# Patient Record
Sex: Male | Born: 1950 | Race: White | Hispanic: No | Marital: Married | Smoking: Former smoker
Health system: Southern US, Community
[De-identification: ages and names within clinical notes are randomized; demographics above are authoritative.]

## PROBLEM LIST (undated history)

## (undated) DIAGNOSIS — F32A Depression, unspecified: Secondary | ICD-10-CM

## (undated) DIAGNOSIS — E785 Hyperlipidemia, unspecified: Secondary | ICD-10-CM

## (undated) DIAGNOSIS — F419 Anxiety disorder, unspecified: Secondary | ICD-10-CM

## (undated) DIAGNOSIS — E349 Endocrine disorder, unspecified: Secondary | ICD-10-CM

## (undated) DIAGNOSIS — F329 Major depressive disorder, single episode, unspecified: Secondary | ICD-10-CM

## (undated) DIAGNOSIS — I10 Essential (primary) hypertension: Secondary | ICD-10-CM

## (undated) DIAGNOSIS — N400 Enlarged prostate without lower urinary tract symptoms: Secondary | ICD-10-CM

## (undated) HISTORY — DX: Depression, unspecified: F32.A

## (undated) HISTORY — DX: Endocrine disorder, unspecified: E34.9

## (undated) HISTORY — DX: Benign prostatic hyperplasia without lower urinary tract symptoms: N40.0

## (undated) HISTORY — PX: FOOT SURGERY: SHX648

## (undated) HISTORY — PX: HERNIA REPAIR: SHX51

## (undated) HISTORY — DX: Major depressive disorder, single episode, unspecified: F32.9

## (undated) HISTORY — DX: Anxiety disorder, unspecified: F41.9

## (undated) HISTORY — DX: Essential (primary) hypertension: I10

## (undated) HISTORY — DX: Hyperlipidemia, unspecified: E78.5

---

## 1999-02-16 ENCOUNTER — Other Ambulatory Visit: Admission: RE | Admit: 1999-02-16 | Discharge: 1999-02-18 | Payer: Self-pay

## 2009-06-30 ENCOUNTER — Encounter: Admission: RE | Admit: 2009-06-30 | Discharge: 2009-09-28 | Payer: Self-pay | Admitting: Orthopedic Surgery

## 2009-12-05 ENCOUNTER — Emergency Department (HOSPITAL_COMMUNITY): Admission: EM | Admit: 2009-12-05 | Discharge: 2009-12-05 | Payer: Self-pay | Admitting: Emergency Medicine

## 2010-01-14 ENCOUNTER — Ambulatory Visit (HOSPITAL_BASED_OUTPATIENT_CLINIC_OR_DEPARTMENT_OTHER): Admission: RE | Admit: 2010-01-14 | Discharge: 2010-01-14 | Payer: Self-pay | Admitting: Orthopedic Surgery

## 2010-06-16 LAB — URINALYSIS, ROUTINE W REFLEX MICROSCOPIC
Hgb urine dipstick: NEGATIVE
Ketones, ur: NEGATIVE mg/dL
Protein, ur: NEGATIVE mg/dL
Urobilinogen, UA: 0.2 mg/dL (ref 0.0–1.0)

## 2010-06-16 LAB — POCT I-STAT 4, (NA,K, GLUC, HGB,HCT)
Glucose, Bld: 121 mg/dL — ABNORMAL HIGH (ref 70–99)
HCT: 48 % (ref 39.0–52.0)
Potassium: 4.8 mEq/L (ref 3.5–5.1)

## 2012-06-10 ENCOUNTER — Ambulatory Visit
Admission: RE | Admit: 2012-06-10 | Discharge: 2012-06-10 | Disposition: A | Discharge status: Home / Self Care | Payer: BC Managed Care – PPO | Source: Ambulatory Visit | Attending: Family Medicine | Admitting: Family Medicine

## 2012-06-10 ENCOUNTER — Other Ambulatory Visit: Payer: Self-pay | Admitting: Family Medicine

## 2012-06-10 DIAGNOSIS — M545 Low back pain: Secondary | ICD-10-CM

## 2012-06-10 LAB — HEPATIC FUNCTION PANEL
ALT: 30 U/L (ref 10–40)
AST: 26 U/L (ref 14–40)
Alkaline Phosphatase: 55 U/L (ref 25–125)
Bilirubin, Direct: 0.2 mg/dL (ref 0.01–0.4)
Bilirubin, Total: 1.2 mg/dL

## 2012-06-10 LAB — LIPID PANEL
LDL Cholesterol: 123 mg/dL
Triglycerides: 138 mg/dL (ref 40–160)

## 2012-06-10 LAB — BASIC METABOLIC PANEL
BUN: 20 mg/dL (ref 4–21)
Creatinine: 1 mg/dL (ref 0.6–1.3)
Glucose: 113 mg/dL

## 2012-06-10 LAB — CBC AND DIFFERENTIAL
Hemoglobin: 15.8 g/dL (ref 13.5–17.5)
WBC: 8.4 10^3/mL

## 2012-06-10 MED ORDER — IOHEXOL 300 MG/ML  SOLN
125.0000 mL | Freq: Once | INTRAMUSCULAR | Status: AC | PRN
  Administered 2012-06-10: 125 mL via INTRAVENOUS

## 2012-06-11 LAB — FECAL OCCULT BLOOD, GUAIAC: Fecal Occult Blood: POSITIVE

## 2012-07-03 ENCOUNTER — Encounter: Payer: Self-pay | Admitting: Family Medicine

## 2012-07-03 DIAGNOSIS — N400 Enlarged prostate without lower urinary tract symptoms: Secondary | ICD-10-CM | POA: Insufficient documentation

## 2012-07-03 DIAGNOSIS — E349 Endocrine disorder, unspecified: Secondary | ICD-10-CM | POA: Insufficient documentation

## 2012-07-03 DIAGNOSIS — K219 Gastro-esophageal reflux disease without esophagitis: Secondary | ICD-10-CM | POA: Insufficient documentation

## 2012-07-03 DIAGNOSIS — F339 Major depressive disorder, recurrent, unspecified: Secondary | ICD-10-CM | POA: Insufficient documentation

## 2012-07-03 DIAGNOSIS — I10 Essential (primary) hypertension: Secondary | ICD-10-CM | POA: Insufficient documentation

## 2012-07-04 ENCOUNTER — Encounter: Payer: Self-pay | Admitting: Family Medicine

## 2012-07-05 ENCOUNTER — Encounter: Payer: Self-pay | Admitting: Family Medicine

## 2012-07-05 NOTE — Progress Notes (Signed)
Patient ID: Arthur Foster, male   DOB: May 25, 1950, 62 y.o.   MRN: [id number] A user error has taken place: encounter opened in error.

## 2012-07-05 NOTE — Progress Notes (Deleted)
This encounter was created in error - please disregard.

## 2012-07-18 ENCOUNTER — Encounter: Payer: Self-pay | Admitting: Family Medicine

## 2012-11-29 ENCOUNTER — Other Ambulatory Visit: Payer: Self-pay | Admitting: Family Medicine

## 2012-12-03 ENCOUNTER — Other Ambulatory Visit: Payer: Self-pay | Admitting: Family Medicine

## 2012-12-03 NOTE — Telephone Encounter (Signed)
Patient needs to be seen. Has exceeded time since last visit. Needs to bring all medications to next appointment.   

## 2012-12-03 NOTE — Telephone Encounter (Signed)
Last level was 05/09/12 and it was 211. If approved route to pool A  To be called in at [facility]

## 2012-12-19 ENCOUNTER — Ambulatory Visit (INDEPENDENT_AMBULATORY_CARE_PROVIDER_SITE_OTHER): Payer: BC Managed Care – PPO | Admitting: Family Medicine

## 2012-12-19 ENCOUNTER — Encounter: Payer: Self-pay | Admitting: Family Medicine

## 2012-12-19 VITALS — BP 136/78 | HR 71 | Temp 97.4°F | Ht 73.5 in | Wt 312.0 lb

## 2012-12-19 DIAGNOSIS — E785 Hyperlipidemia, unspecified: Secondary | ICD-10-CM

## 2012-12-19 DIAGNOSIS — R413 Other amnesia: Secondary | ICD-10-CM

## 2012-12-19 DIAGNOSIS — N400 Enlarged prostate without lower urinary tract symptoms: Secondary | ICD-10-CM

## 2012-12-19 DIAGNOSIS — I10 Essential (primary) hypertension: Secondary | ICD-10-CM

## 2012-12-19 DIAGNOSIS — F329 Major depressive disorder, single episode, unspecified: Secondary | ICD-10-CM

## 2012-12-19 DIAGNOSIS — R5383 Other fatigue: Secondary | ICD-10-CM

## 2012-12-19 DIAGNOSIS — E349 Endocrine disorder, unspecified: Secondary | ICD-10-CM

## 2012-12-19 DIAGNOSIS — K219 Gastro-esophageal reflux disease without esophagitis: Secondary | ICD-10-CM

## 2012-12-19 DIAGNOSIS — E291 Testicular hypofunction: Secondary | ICD-10-CM

## 2012-12-19 DIAGNOSIS — R5381 Other malaise: Secondary | ICD-10-CM

## 2012-12-19 MED ORDER — TESTOSTERONE 20.25 MG/1.25GM (1.62%) TD GEL: 4.0000 | Freq: Every day | TRANSDERMAL | Status: DC

## 2012-12-19 NOTE — Progress Notes (Signed)
Subjective:    Patient ID: Arthur Foster, male    DOB: 04-21-1950, 62 y.o.   MRN: [id number]  HPI Pt here for follow up of chronic medical problems. Patient comes in today complaining of increased fatigue and weakness. He says his sex drive is diminished. He also complains of short-term memory loss. He has a positive family history for Alzheimer's. He's been off of AndroGel for 3 weeks. He was using 1.62% 4 puffs daily. The reason he ran out of AndroGel was because the prescription expired. Patient Active Problem List   Diagnosis Date Noted  . BPH (benign prostatic hypertrophy) 07/03/2012  . Essential hypertension, benign 07/03/2012  . Depression 07/03/2012  . GERD (gastroesophageal reflux disease) 07/03/2012  . Testosterone deficiency 07/03/2012   Outpatient Encounter Prescriptions as of 12/19/2012  Medication Sig Dispense Refill  . ALPRAZolam (XANAX) 0.25 MG tablet Take 0.25 mg by mouth at bedtime as needed for sleep (1/2 - 1).      . aspirin 81 MG tablet Take 81 mg by mouth daily.      [name] escitalopram (LEXAPRO) 10 MG tablet Take 10 mg by mouth daily.      [name] lisinopril (PRINIVIL,ZESTRIL) 20 MG tablet TAKE 1 TABLET EVERY DAY  90 tablet  0  . Multiple Vitamin (MULTIVITAMIN) tablet Take 1 tablet by mouth daily.      [name] omega-3 acid ethyl esters (LOVAZA) 1 G capsule Take 2 g by mouth daily.       . Testosterone (ANDROGEL) 20.25 MG/1.25GM (1.62%) GEL Place 4 Act onto the skin daily.      . Cholecalciferol (VITAMIN D) 2000 UNITS CAPS Take 1 capsule by mouth daily.       No facility-administered encounter medications on file as of 12/19/2012.       Review of Systems  Constitutional: Positive for fatigue.  HENT: Negative.   Eyes: Negative.   Respiratory: Negative.   Cardiovascular: Negative.   Gastrointestinal: Negative.   Endocrine: Negative.   Genitourinary: Negative.   Musculoskeletal: Negative.   Skin: Negative.   Allergic/Immunologic: Negative.   Neurological: Positive for  weakness.  Hematological: Negative.   Psychiatric/Behavioral: Negative.        Objective:   Physical Exam  Nursing note and vitals reviewed. Constitutional: He is oriented to person, place, and time. He appears well-developed and well-nourished. No distress.  HENT:  Head: Normocephalic and atraumatic.  Right Ear: External ear normal.  Left Ear: External ear normal.  Nose: Nose normal.  Mouth/Throat: Oropharynx is clear and moist. No oropharyngeal exudate.  Eyes: Conjunctivae and EOM are normal. Pupils are equal, round, and reactive to light. Right eye exhibits no discharge. Left eye exhibits no discharge. No scleral icterus.  Neck: Normal range of motion. Neck supple. No thyromegaly present.  Cardiovascular: Normal rate, regular rhythm, normal heart sounds and intact distal pulses.   No murmur heard. At 72 per minute  Pulmonary/Chest: Effort normal and breath sounds normal. No respiratory distress. He has no wheezes. He has no rales.  Abdominal: Soft. Bowel sounds are normal. He exhibits no mass. There is no tenderness. There is no rebound and no guarding.  Mild obesity  Genitourinary: Rectum normal and penis normal. No penile tenderness.  Prostate enlarged bilaterally and smooth without lumps There were no rectal masses There were no inguinal hernias  Musculoskeletal: Normal range of motion. He exhibits no edema and no tenderness.  Lymphadenopathy:    He has no cervical adenopathy.  Neurological: He is alert and oriented to person,  place, and time. He has normal reflexes.  Patient complains of short-term memory loss. Because of this complaint we will do a Mini-Mental Status exam today  Skin: Skin is warm and dry. No rash noted. No erythema. No pallor.  Psychiatric: He has a normal mood and affect. His behavior is normal. Judgment and thought content normal.    BP 136/78  Pulse 71  Temp(Src) 97.4 F (36.3 C) (Oral)  Ht 6' 1.5" (1.867 m)  Wt 312 lb (141.522 kg)  BMI 40.6  kg/m2       Assessment & Plan:   1. BPH (benign prostatic hypertrophy)   2. Essential hypertension, benign   3. Depression   4. GERD (gastroesophageal reflux disease)   5. Testosterone deficiency   6. Fatigue   7. Hyperlipidemia   8. Memory deficits    Orders Placed This Encounter  Procedures  . Hepatic function panel    Standing Status: Future     Number of Occurrences:      Standing Expiration Date: 12/19/2013  . BMP8+EGFR    Standing Status: Future     Number of Occurrences:      Standing Expiration Date: 12/19/2013  . NMR, lipoprofile    Standing Status: Future     Number of Occurrences:      Standing Expiration Date: 12/19/2013  . Vitamin B12    Standing Status: Future     Number of Occurrences:      Standing Expiration Date: 12/19/2013  . Vit D  25 hydroxy (rtn osteoporosis monitoring)    Standing Status: Future     Number of Occurrences:      Standing Expiration Date: 12/19/2013  . Thyroid Panel With TSH    Standing Status: Future     Number of Occurrences:      Standing Expiration Date: 12/19/2013  . POCT CBC    Standing Status: Future     Number of Occurrences:      Standing Expiration Date: 12/19/2013   Patient Instructions  Continue current medications. Continue good therapeutic lifestyle changes.  Fall precautions discussed with patient.  Schedule your flu vaccine the first of October.  Follow up as planned and earlier as needed.  Be sure and call for AndroGel refill in February 2015    Nyra Capes MD

## 2012-12-19 NOTE — Patient Instructions (Addendum)
Continue current medications. Continue good therapeutic lifestyle changes.  Fall precautions discussed with patient.  Schedule your flu vaccine the first of October.  Follow up as planned and earlier as needed.  Be sure and call for AndroGel refill in February 2015

## 2012-12-23 ENCOUNTER — Other Ambulatory Visit (INDEPENDENT_AMBULATORY_CARE_PROVIDER_SITE_OTHER): Payer: BC Managed Care – PPO

## 2012-12-23 DIAGNOSIS — E349 Endocrine disorder, unspecified: Secondary | ICD-10-CM

## 2012-12-23 DIAGNOSIS — E785 Hyperlipidemia, unspecified: Secondary | ICD-10-CM

## 2012-12-23 DIAGNOSIS — R5381 Other malaise: Secondary | ICD-10-CM

## 2012-12-23 DIAGNOSIS — E291 Testicular hypofunction: Secondary | ICD-10-CM

## 2012-12-23 DIAGNOSIS — I10 Essential (primary) hypertension: Secondary | ICD-10-CM

## 2012-12-23 DIAGNOSIS — K219 Gastro-esophageal reflux disease without esophagitis: Secondary | ICD-10-CM

## 2012-12-23 DIAGNOSIS — N400 Enlarged prostate without lower urinary tract symptoms: Secondary | ICD-10-CM

## 2012-12-23 DIAGNOSIS — R5383 Other fatigue: Secondary | ICD-10-CM

## 2012-12-23 LAB — POCT CBC
Granulocyte percent: 69.2 %G (ref 37–80)
HCT, POC: 45.4 % (ref 43.5–53.7)
Hemoglobin: 15.3 g/dL (ref 14.1–18.1)
MPV: 7.8 fL (ref 0–99.8)
POC Granulocyte: 4.3 (ref 2–6.9)

## 2012-12-23 NOTE — Progress Notes (Signed)
Pt came in for labs only 

## 2012-12-25 LAB — BMP8+EGFR
BUN/Creatinine Ratio: 20 (ref 9–20)
BUN: 22 mg/dL (ref 6–24)
Chloride: 102 mmol/L (ref 97–108)
GFR calc Af Amer: 83 mL/min/{1.73_m2} (ref >59)
GFR calc non Af Amer: 72 mL/min/{1.73_m2} (ref >59)
Glucose: 118 mg/dL — ABNORMAL HIGH (ref 65–99)
Potassium: 5.3 mmol/L — ABNORMAL HIGH (ref 3.5–5.2)

## 2012-12-25 LAB — NMR, LIPOPROFILE
Cholesterol: 217 mg/dL — ABNORMAL HIGH (ref <200)
HDL Particle Number: 35.2 umol/L (ref >=30.5)
LDL Particle Number: 2146 nmol/L — ABNORMAL HIGH (ref <1000)
LDLC SERPL CALC-MCNC: 141 mg/dL — ABNORMAL HIGH (ref <100)
LP-IR Score: 78 — ABNORMAL HIGH (ref <=45)
Triglycerides by NMR: 150 mg/dL — ABNORMAL HIGH (ref <150)

## 2012-12-25 LAB — THYROID PANEL WITH TSH
Free Thyroxine Index: 2.1 (ref 1.2–4.9)
T4, Total: 6.7 ug/dL (ref 4.5–12.0)
TSH: 3.23 u[IU]/mL (ref 0.450–4.500)

## 2012-12-25 LAB — HEPATIC FUNCTION PANEL
Albumin: 4.6 g/dL (ref 3.5–5.5)
Alkaline Phosphatase: 61 IU/L (ref 39–117)
Bilirubin, Direct: 0.28 mg/dL (ref 0.00–0.40)
Total Protein: 6.7 g/dL (ref 6.0–8.5)

## 2012-12-25 LAB — PSA, TOTAL AND FREE: PSA, Free: 0.11 ng/mL

## 2012-12-25 LAB — TESTOSTERONE,FREE AND TOTAL: Testosterone: 70 ng/dL — ABNORMAL LOW (ref 348–1197)

## 2013-01-03 ENCOUNTER — Telehealth: Payer: Self-pay | Admitting: Family Medicine

## 2013-01-11 ENCOUNTER — Other Ambulatory Visit: Payer: Self-pay | Admitting: Family Medicine

## 2013-01-13 NOTE — Telephone Encounter (Signed)
Last seen 06/10/12 DWM  If approved route to nurse to call into [facility]

## 2013-01-13 NOTE — Telephone Encounter (Signed)
This is okay with one refill

## 2013-01-14 ENCOUNTER — Telehealth: Payer: Self-pay | Admitting: Family Medicine

## 2013-01-14 NOTE — Telephone Encounter (Signed)
Pt med called to pharm

## 2013-01-17 NOTE — Telephone Encounter (Signed)
Already called

## 2013-01-22 NOTE — Telephone Encounter (Signed)
Patient called and discussed laboratory results.  I discussed starting medication for cholesterol and he refuses to start statin currently.   Left information about Mediterranean Diet at front desk for patient.

## 2013-03-02 ENCOUNTER — Other Ambulatory Visit: Payer: Self-pay | Admitting: Family Medicine

## 2013-03-03 ENCOUNTER — Telehealth: Payer: Self-pay | Admitting: Family Medicine

## 2013-03-20 ENCOUNTER — Other Ambulatory Visit: Payer: Self-pay | Admitting: Family Medicine

## 2013-03-24 NOTE — Telephone Encounter (Signed)
Last seen 12/19/12  DWM   If approved route to nurse to call into [facility]

## 2013-03-24 NOTE — Telephone Encounter (Signed)
This is okay for 6 months 

## 2013-03-25 NOTE — Telephone Encounter (Signed)
[facility] 

## 2013-05-21 ENCOUNTER — Ambulatory Visit (INDEPENDENT_AMBULATORY_CARE_PROVIDER_SITE_OTHER): Payer: BC Managed Care – PPO | Admitting: Nurse Practitioner

## 2013-05-21 VITALS — BP 153/87 | HR 77 | Temp 99.1°F

## 2013-05-21 DIAGNOSIS — R059 Cough, unspecified: Secondary | ICD-10-CM

## 2013-05-21 DIAGNOSIS — R05 Cough: Secondary | ICD-10-CM

## 2013-05-21 DIAGNOSIS — J069 Acute upper respiratory infection, unspecified: Secondary | ICD-10-CM

## 2013-05-21 DIAGNOSIS — J029 Acute pharyngitis, unspecified: Secondary | ICD-10-CM

## 2013-05-21 DIAGNOSIS — B9789 Other viral agents as the cause of diseases classified elsewhere: Secondary | ICD-10-CM

## 2013-05-21 LAB — POCT RAPID STREP A (OFFICE): RAPID STREP A SCREEN: NEGATIVE

## 2013-05-21 LAB — POCT INFLUENZA A/B
Influenza A, POC: NEGATIVE
Influenza B, POC: NEGATIVE

## 2013-05-21 NOTE — Patient Instructions (Addendum)

## 2013-05-21 NOTE — Progress Notes (Signed)
   Subjective:    Patient ID: Arthur Foster, male    DOB: 26-Apr-1950, 63 y.o.   MRN: [id number]  HPI Patient presents today with complaints of a 'Cold' that began last Friday. Symptoms include runny nose, productive cough, sore throat, post nasal drip, and fatigue. Denies fever, sinus tenderness. Does have a history of allergies and takes generic Claritin which does help. Has not taken any over the counter medications.    Review of Systems  HENT: Positive for congestion, postnasal drip, rhinorrhea and sore throat. Negative for ear pain and sinus pressure.   Respiratory: Positive for cough. Negative for shortness of breath.   Cardiovascular: Negative for chest pain.  Genitourinary: Negative.   Neurological: Positive for headaches.       Objective:   Physical Exam  HENT:  Right Ear: External ear normal.  Left Ear: External ear normal.  Nose: Mucosal edema and rhinorrhea present. Right sinus exhibits no maxillary sinus tenderness and no frontal sinus tenderness. Left sinus exhibits no maxillary sinus tenderness and no frontal sinus tenderness.  Eyes: Pupils are equal, round, and reactive to light.  Neck: Normal range of motion. Neck supple.   BP 153/87  Pulse 77  Temp(Src) 99.1 F (37.3 C) (Oral) Results for orders placed in visit on 05/21/13  POCT INFLUENZA A/B      Result Value Ref Range   Influenza A, POC Negative     Influenza B, POC Negative    POCT RAPID STREP A (OFFICE)      Result Value Ref Range   Rapid Strep A Screen Negative  Negative          Assessment & Plan:   1. Sore throat   2. Cough   3. Viral URI with cough    1. Take meds as prescribed 2. Use a cool mist humidifier especially during the winter months and when heat has been humid. 3. Use saline nose sprays frequently 4. Saline irrigations of the nose can be very helpful if done frequently.  * 4X daily for 1 week*  * Use of a nettie pot can be helpful with this. Follow directions with this* 5. Drink  plenty of fluids 6. Keep thermostat turn down low 7.For any cough or congestion  Use plain Mucinex- regular strength or max strength is fine- NO DECONGESTANTS!   * Children- consult with Pharmacist for dosing 8. For fever or aces or pains- take tylenol or ibuprofen appropriate for age and weight.  * for fevers greater than 101 orally you may alternate ibuprofen and tylenol every  3 hours.   Mary-Margaret Hassell Done, FNP

## 2013-05-28 ENCOUNTER — Telehealth: Payer: Self-pay | Admitting: Nurse Practitioner

## 2013-05-30 ENCOUNTER — Telehealth: Payer: Self-pay | Admitting: Nurse Practitioner

## 2013-05-30 MED ORDER — AZITHROMYCIN 250 MG PO TABS: ORAL_TABLET | ORAL | Status: DC

## 2013-05-30 NOTE — Telephone Encounter (Signed)
Have already called in antibiotic earlier today

## 2013-05-30 NOTE — Telephone Encounter (Signed)
Patient aware.

## 2013-05-30 NOTE — Telephone Encounter (Signed)
rx sent to pharmacy

## 2013-06-01 ENCOUNTER — Other Ambulatory Visit: Payer: Self-pay | Admitting: Family Medicine

## 2013-06-03 NOTE — Telephone Encounter (Signed)
Please let patient know this has been approved.

## 2013-06-18 ENCOUNTER — Encounter: Payer: Self-pay | Admitting: Family Medicine

## 2013-06-18 ENCOUNTER — Ambulatory Visit (INDEPENDENT_AMBULATORY_CARE_PROVIDER_SITE_OTHER): Payer: BC Managed Care – PPO | Admitting: Family Medicine

## 2013-06-18 VITALS — BP 153/91 | HR 70 | Temp 97.1°F | Ht 73.5 in | Wt 313.0 lb

## 2013-06-18 DIAGNOSIS — N400 Enlarged prostate without lower urinary tract symptoms: Secondary | ICD-10-CM

## 2013-06-18 DIAGNOSIS — F329 Major depressive disorder, single episode, unspecified: Secondary | ICD-10-CM

## 2013-06-18 DIAGNOSIS — E349 Endocrine disorder, unspecified: Secondary | ICD-10-CM

## 2013-06-18 DIAGNOSIS — E559 Vitamin D deficiency, unspecified: Secondary | ICD-10-CM

## 2013-06-18 DIAGNOSIS — S60222A Contusion of left hand, initial encounter: Secondary | ICD-10-CM

## 2013-06-18 DIAGNOSIS — R5383 Other fatigue: Secondary | ICD-10-CM

## 2013-06-18 DIAGNOSIS — F32A Depression, unspecified: Secondary | ICD-10-CM

## 2013-06-18 DIAGNOSIS — J309 Allergic rhinitis, unspecified: Secondary | ICD-10-CM

## 2013-06-18 DIAGNOSIS — L03114 Cellulitis of left upper limb: Secondary | ICD-10-CM

## 2013-06-18 DIAGNOSIS — Z0000 Encounter for general adult medical examination without abnormal findings: Secondary | ICD-10-CM

## 2013-06-18 DIAGNOSIS — K219 Gastro-esophageal reflux disease without esophagitis: Secondary | ICD-10-CM

## 2013-06-18 DIAGNOSIS — I10 Essential (primary) hypertension: Secondary | ICD-10-CM

## 2013-06-18 LAB — POCT CBC
Granulocyte percent: 68.2 %G (ref 37–80)
HCT, POC: 47.5 % (ref 43.5–53.7)
HEMOGLOBIN: 15.1 g/dL (ref 14.1–18.1)
Lymph, poc: 2.2 (ref 0.6–3.4)
MCH: 28.5 pg (ref 27–31.2)
MCHC: 31.8 g/dL (ref 31.8–35.4)
MCV: 89.4 fL (ref 80–97)
MPV: 8.1 fL (ref 0–99.8)
POC Granulocyte: 5.3 (ref 2–6.9)
POC LYMPH PERCENT: 28.7 %L (ref 10–50)
Platelet Count, POC: 315 10*3/uL (ref 142–424)
RBC: 5.3 M/uL (ref 4.69–6.13)
RDW, POC: 13.1 %
WBC: 7.7 10*3/uL (ref 4.6–10.2)

## 2013-06-18 LAB — POCT URINALYSIS DIPSTICK
Bilirubin, UA: NEGATIVE
Glucose, UA: NEGATIVE
Ketones, UA: NEGATIVE
Leukocytes, UA: NEGATIVE
Nitrite, UA: NEGATIVE
PROTEIN UA: NEGATIVE
RBC UA: NEGATIVE
SPEC GRAV UA: 1.015
UROBILINOGEN UA: NEGATIVE
pH, UA: 6.5

## 2013-06-18 LAB — POCT UA - MICROSCOPIC ONLY
BACTERIA, U MICROSCOPIC: NEGATIVE
CASTS, UR, LPF, POC: NEGATIVE
CRYSTALS, UR, HPF, POC: NEGATIVE
MUCUS UA: NEGATIVE
RBC, urine, microscopic: NEGATIVE
WBC, Ur, HPF, POC: NEGATIVE
Yeast, UA: NEGATIVE

## 2013-06-18 MED ORDER — ESCITALOPRAM OXALATE 10 MG PO TABS: 10.0000 mg | ORAL_TABLET | Freq: Every day | ORAL | Status: DC

## 2013-06-18 MED ORDER — OMEGA-3-ACID ETHYL ESTERS 1 G PO CAPS: ORAL_CAPSULE | ORAL | Status: DC

## 2013-06-18 MED ORDER — TESTOSTERONE 20.25 MG/1.25GM (1.62%) TD GEL: 4.0000 | Freq: Every day | TRANSDERMAL | Status: DC

## 2013-06-18 MED ORDER — LISINOPRIL 20 MG PO TABS: ORAL_TABLET | ORAL | Status: DC

## 2013-06-18 MED ORDER — ALPRAZOLAM 0.25 MG PO TABS: ORAL_TABLET | ORAL | Status: DC

## 2013-06-18 MED ORDER — SULFAMETHOXAZOLE-TMP DS 800-160 MG PO TABS: 1.0000 | ORAL_TABLET | Freq: Two times a day (BID) | ORAL | Status: DC

## 2013-06-18 NOTE — Progress Notes (Signed)
Subjective:    Patient ID: Arthur Foster, male    DOB: 1950-11-02, 62 y.o.   MRN: [id number]  HPI Patient is here today for annual wellness exam and follow up of chronic medical problems. Patient is on chronic testosterone replacement. He does complain of some swelling in his left hand. His chest x-ray and cardiogram was or less than 2 years. Because of testosterone replacement he will get a PSA and prostate exam today. He will also get the remainder of his labs done today. She will be given an FOBT to return. He will check with his insurance regarding the Prevnar vaccine.        Patient Active Problem List   Diagnosis Date Noted  . BPH (benign prostatic hypertrophy) 07/03/2012  . Essential hypertension, benign 07/03/2012  . Depression 07/03/2012  . GERD (gastroesophageal reflux disease) 07/03/2012  . Testosterone deficiency 07/03/2012   Outpatient Encounter Prescriptions as of 06/18/2013  Medication Sig  . ALPRAZolam (XANAX) 0.25 MG tablet TAKE 1/2 TO 1 TABLET AT BEDTIME AS NEEDED  . aspirin 81 MG tablet Take 81 mg by mouth daily.  [name] escitalopram (LEXAPRO) 10 MG tablet Take 10 mg by mouth daily.  [name] lisinopril (PRINIVIL,ZESTRIL) 20 MG tablet TAKE 1 TABLET EVERY DAY  . Multiple Vitamin (MULTIVITAMIN) tablet Take 1 tablet by mouth daily.  [name] omega-3 acid ethyl esters (LOVAZA) 1 G capsule TAKE 2 CAPSULES TWICE A DAY  . Testosterone (ANDROGEL) 20.25 MG/1.25GM (1.62%) GEL Place 4 Act onto the skin daily.  . [DISCONTINUED] azithromycin (ZITHROMAX Z-PAK) 250 MG tablet As direceted    Review of Systems  Constitutional: Negative.   HENT: Negative.   Eyes: Negative.   Respiratory: Negative.   Cardiovascular: Negative.   Gastrointestinal: Negative.   Endocrine: Negative.   Genitourinary: Negative.   Musculoskeletal: Negative.   Skin: Negative.        Left hand swollen   Allergic/Immunologic: Negative.   Neurological: Negative.   Hematological: Negative.   Psychiatric/Behavioral:  Negative.        Objective:   Physical Exam  Nursing note and vitals reviewed. Constitutional: He is oriented to person, place, and time. He appears well-developed and well-nourished. No distress.  HENT:  Head: Normocephalic and atraumatic.  Right Ear: External ear normal.  Left Ear: External ear normal.  Mouth/Throat: Oropharynx is clear and moist. No oropharyngeal exudate.  Nasal congestion and turbinate swelling bilaterally. Slightly red throat posteriorly  Eyes: Conjunctivae and EOM are normal. Pupils are equal, round, and reactive to light. Right eye exhibits no discharge. Left eye exhibits no discharge. No scleral icterus.  Neck: Normal range of motion. Neck supple. No thyromegaly present.  No carotid bruits  Cardiovascular: Normal rate, regular rhythm, normal heart sounds and intact distal pulses.  Exam reveals no gallop and no friction rub.   No murmur heard. 72 per minute  Pulmonary/Chest: Effort normal and breath sounds normal. No respiratory distress. He has no wheezes. He has no rales. He exhibits no tenderness.  No axillary adenopathy, no chest wall mass   Abdominal: Soft. Bowel sounds are normal. He exhibits no mass. There is no tenderness. There is no rebound and no guarding.  Obesity  Genitourinary: Rectum normal and penis normal.  The prostate was enlarged but soft and smooth. There were no nodules or masses. There are no rectal masses. There is no inguinal hernia palpated. There were no inguinal nodes. The external genitalia were normal there  Musculoskeletal: Normal range of motion. He exhibits edema. He exhibits  no tenderness.  There was slight edema and discoloration of the dorsal aspect of the left hand. There were 2 small puncture wounds. There was some rubor but more from what appeared to be a resolving hematoma.  Lymphadenopathy:    He has no cervical adenopathy.  Neurological: He is alert and oriented to person, place, and time. He has normal reflexes. No  cranial nerve deficit.  Skin: Skin is warm and dry. No rash noted. There is erythema. No pallor.  Left dorsal hand  Psychiatric: He has a normal mood and affect. His behavior is normal. Judgment and thought content normal.   BP 153/91  Pulse 70  Temp(Src) 97.1 F (36.2 C) (Oral)  Ht 6' 1.5" (1.867 m)  Wt 313 lb (141.976 kg)  BMI 40.73 kg/m2 Blood pressure 136/95 seen right arm       Assessment & Plan:  1. BPH (benign prostatic hypertrophy) - POCT CBC - POCT UA - Microscopic Only - POCT urinalysis dipstick - PSA, total and free - Testosterone,Free and Total  2. Essential hypertension, benign - POCT CBC - BMP8+EGFR - Hepatic function panel  3. GERD (gastroesophageal reflux disease) - POCT CBC - Hepatic function panel - NMR, lipoprofile  4. Testosterone deficiency - POCT CBC - PSA, total and free - Testosterone,Free and Total - Testosterone (ANDROGEL) 20.25 MG/1.25GM (1.62%) GEL; Place 4 Act onto the skin daily.  Dispense: 2 g; Refill: 5  5. Annual physical exam - POCT CBC - POCT UA - Microscopic Only - POCT urinalysis dipstick - BMP8+EGFR - Hepatic function panel - NMR, lipoprofile - PSA, total and free - Testosterone,Free and Total - Vit D  25 hydroxy (rtn osteoporosis monitoring)  6. Vitamin D deficiency - Vit D  25 hydroxy (rtn osteoporosis monitoring)  7. Depression - Testosterone (ANDROGEL) 20.25 MG/1.25GM (1.62%) GEL; Place 4 Act onto the skin daily.  Dispense: 2 g; Refill: 5  8. Fatigue - Testosterone (ANDROGEL) 20.25 MG/1.25GM (1.62%) GEL; Place 4 Act onto the skin daily.  Dispense: 2 g; Refill: 5  9. Paroxysmal hematoma of left hand  10. Cellulitis of left hand -Septra DS one twice daily for a 14 day  11. Allergic rhinitis -Flonase or Nasacort as directed  Patient Instructions  Continue current medications. Continue good therapeutic lifestyle changes which include good diet and exercise. Fall precautions discussed with patient. If an  FOBT was given today- please return it to our front desk. If you are over 60 years old - you may need Prevnar 78 or the adult Pneumonia vaccine.  Please do not forget to get your eye exam every 1-2 years Check with your insurance regarding the Prevnar and shingles vaccine Can purchase an [employer] blood pressure monitor to monitor your blood pressures at home Check pressures regularly and bring readings by 2-3 weeks and let the nurse checked her blood pressure here Watch your sodium intake Do not forget to return here FOBT  For allergic rhinitis you can purchase Nasacort over-the-counter or Flonase nasal spray over-the-counter. 1-2 sprays each nostril at bedtime Try to do your best with losing weight   Arrie Senate MD

## 2013-06-18 NOTE — Patient Instructions (Addendum)
Continue current medications. Continue good therapeutic lifestyle changes which include good diet and exercise. Fall precautions discussed with patient. If an FOBT was given today- please return it to our front desk. If you are over 61 years old - you may need Prevnar 18 or the adult Pneumonia vaccine.  Please do not forget to get your eye exam every 1-2 years Check with your insurance regarding the Prevnar and shingles vaccine Can purchase an [employer] blood pressure monitor to monitor your blood pressures at home Check pressures regularly and bring readings by 2-3 weeks and let the nurse checked her blood pressure here Watch your sodium intake Do not forget to return here FOBT  For allergic rhinitis you can purchase Nasacort over-the-counter or Flonase nasal spray over-the-counter. 1-2 sprays each nostril at bedtime Try to do your best with losing weight

## 2013-06-19 ENCOUNTER — Telehealth: Payer: Self-pay | Admitting: Family Medicine

## 2013-06-20 LAB — HEPATIC FUNCTION PANEL
ALBUMIN: 4.6 g/dL (ref 3.6–4.8)
ALK PHOS: 57 IU/L (ref 39–117)
ALT: 34 IU/L (ref 0–44)
AST: 24 IU/L (ref 0–40)
Bilirubin, Direct: 0.22 mg/dL (ref 0.00–0.40)
Total Bilirubin: 0.9 mg/dL (ref 0.0–1.2)
Total Protein: 7.1 g/dL (ref 6.0–8.5)

## 2013-06-20 LAB — BMP8+EGFR
BUN/Creatinine Ratio: 15 (ref 10–22)
BUN: 18 mg/dL (ref 8–27)
CHLORIDE: 100 mmol/L (ref 97–108)
CO2: 22 mmol/L (ref 18–29)
Calcium: 9.6 mg/dL (ref 8.6–10.2)
Creatinine, Ser: 1.17 mg/dL (ref 0.76–1.27)
GFR calc non Af Amer: 67 mL/min/{1.73_m2} (ref >59)
GFR, EST AFRICAN AMERICAN: 78 mL/min/{1.73_m2} (ref >59)
Glucose: 107 mg/dL — ABNORMAL HIGH (ref 65–99)
POTASSIUM: 5 mmol/L (ref 3.5–5.2)
Sodium: 139 mmol/L (ref 134–144)

## 2013-06-20 LAB — NMR, LIPOPROFILE
Cholesterol: 225 mg/dL — ABNORMAL HIGH (ref <200)
HDL Cholesterol by NMR: 51 mg/dL (ref >=40)
HDL PARTICLE NUMBER: 35.3 umol/L (ref >=30.5)
LDL Particle Number: 2065 nmol/L — ABNORMAL HIGH (ref <1000)
LDL Size: 20.9 nm (ref >20.5)
LDLC SERPL CALC-MCNC: 147 mg/dL — AB (ref <100)
LP-IR Score: 83 — ABNORMAL HIGH (ref <=45)
Small LDL Particle Number: 843 nmol/L — ABNORMAL HIGH (ref <=527)
TRIGLYCERIDES BY NMR: 134 mg/dL (ref <150)

## 2013-06-20 LAB — PSA, TOTAL AND FREE
PSA, Free Pct: 21.4 %
PSA, Free: 0.15 ng/mL
PSA: 0.7 ng/mL (ref 0.0–4.0)

## 2013-06-20 LAB — TESTOSTERONE,FREE AND TOTAL
TESTOSTERONE: 153 ng/dL — AB (ref 348–1197)
Testosterone, Free: 8.6 pg/mL (ref 6.6–18.1)

## 2013-06-20 LAB — VITAMIN D 25 HYDROXY (VIT D DEFICIENCY, FRACTURES): VIT D 25 HYDROXY: 27.6 ng/mL — AB (ref 30.0–100.0)

## 2013-06-23 MED ORDER — PHENTERMINE HCL 37.5 MG PO CAPS: 37.5000 mg | ORAL_CAPSULE | ORAL | Status: DC

## 2013-06-23 NOTE — Telephone Encounter (Signed)
Use of Belviq and SSRI could lead to increase in serotonin.  Studies for Belviq excluded patient's on SSRI.  Although serotonin syndrome is rare I recommend hold escitalopram while taking Belviq. Patient is hesitant to stop escitalopram because when he stopped before his anxiety got worse.  Recommend try phentermine 37.5mg  1/2 to 1 tablet each morning.

## 2013-06-23 NOTE — Telephone Encounter (Signed)
Please see note by clinical pharmacist. We will prevent a prescription for phentermine 37.5 one half to one tablet each morning #30 He will come by and get his weight take the by mouth regularly for 30 days and come by and get another weight at that time

## 2013-06-23 NOTE — Telephone Encounter (Signed)
Tammy, this is okay,if you will double check with his medication list to make sure there is no contraindication.

## 2013-07-01 ENCOUNTER — Other Ambulatory Visit: Payer: Self-pay | Admitting: Family Medicine

## 2013-07-03 ENCOUNTER — Other Ambulatory Visit: Payer: Self-pay | Admitting: Family Medicine

## 2013-08-03 ENCOUNTER — Other Ambulatory Visit: Payer: Self-pay | Admitting: Family Medicine

## 2013-10-07 ENCOUNTER — Other Ambulatory Visit: Payer: Self-pay | Admitting: Family Medicine

## 2013-10-09 NOTE — Telephone Encounter (Signed)
Last seen 06/18/13, last filled 08/27/13. Route to pool A, nurse call in at [facility]

## 2013-10-09 NOTE — Telephone Encounter (Signed)
This is okay to refill 

## 2013-10-10 NOTE — Telephone Encounter (Signed)
Called in.

## 2013-10-20 ENCOUNTER — Other Ambulatory Visit: Payer: Self-pay | Admitting: Family Medicine

## 2013-10-21 ENCOUNTER — Other Ambulatory Visit: Payer: Self-pay | Admitting: Family Medicine

## 2013-10-21 NOTE — Telephone Encounter (Signed)
This is okay to refill. Patient needs to have an appointment to be seen in September for continued refills

## 2013-10-21 NOTE — Telephone Encounter (Signed)
Patient aware to be picked up

## 2013-10-21 NOTE — Telephone Encounter (Signed)
This was already taken care of.

## 2013-10-21 NOTE — Telephone Encounter (Signed)
Patient last seen in office on 06-18-13. Rx last filled on 09-08-13 for 150g. Please advise. If approved please print and route to Pool A so nurse can call patient to pick up

## 2013-12-24 ENCOUNTER — Encounter: Payer: Self-pay | Admitting: Family Medicine

## 2013-12-24 ENCOUNTER — Ambulatory Visit (INDEPENDENT_AMBULATORY_CARE_PROVIDER_SITE_OTHER): Payer: BC Managed Care – PPO | Admitting: Family Medicine

## 2013-12-24 VITALS — BP 150/94 | HR 70 | Temp 97.9°F | Ht 73.5 in | Wt 318.0 lb

## 2013-12-24 DIAGNOSIS — K219 Gastro-esophageal reflux disease without esophagitis: Secondary | ICD-10-CM

## 2013-12-24 DIAGNOSIS — G4700 Insomnia, unspecified: Secondary | ICD-10-CM

## 2013-12-24 DIAGNOSIS — E8881 Metabolic syndrome: Secondary | ICD-10-CM

## 2013-12-24 DIAGNOSIS — E291 Testicular hypofunction: Secondary | ICD-10-CM

## 2013-12-24 DIAGNOSIS — I10 Essential (primary) hypertension: Secondary | ICD-10-CM

## 2013-12-24 DIAGNOSIS — E349 Endocrine disorder, unspecified: Secondary | ICD-10-CM

## 2013-12-24 DIAGNOSIS — N400 Enlarged prostate without lower urinary tract symptoms: Secondary | ICD-10-CM

## 2013-12-24 DIAGNOSIS — E559 Vitamin D deficiency, unspecified: Secondary | ICD-10-CM

## 2013-12-24 DIAGNOSIS — J301 Allergic rhinitis due to pollen: Secondary | ICD-10-CM

## 2013-12-24 LAB — POCT CBC
GRANULOCYTE PERCENT: 71.8 % (ref 37–80)
HEMATOCRIT: 48.7 % (ref 43.5–53.7)
Hemoglobin: 16.2 g/dL (ref 14.1–18.1)
LYMPH, POC: 1.6 (ref 0.6–3.4)
MCH, POC: 30 pg (ref 27–31.2)
MCHC: 33.4 g/dL (ref 31.8–35.4)
MCV: 90 fL (ref 80–97)
MPV: 7.8 fL (ref 0–99.8)
PLATELET COUNT, POC: 299 10*3/uL (ref 142–424)
POC Granulocyte: 4.9 (ref 2–6.9)
POC LYMPH %: 24.1 % (ref 10–50)
RBC: 5.4 M/uL (ref 4.69–6.13)
RDW, POC: 13 %
WBC: 6.8 10*3/uL (ref 4.6–10.2)

## 2013-12-24 LAB — POCT URINALYSIS DIPSTICK
BILIRUBIN UA: NEGATIVE
Glucose, UA: NEGATIVE
Ketones, UA: NEGATIVE
Leukocytes, UA: NEGATIVE
NITRITE UA: NEGATIVE
Protein, UA: NEGATIVE
RBC UA: NEGATIVE
Spec Grav, UA: 1.015
UROBILINOGEN UA: NEGATIVE
pH, UA: 7

## 2013-12-24 LAB — POCT UA - MICROSCOPIC ONLY
Bacteria, U Microscopic: NEGATIVE
CASTS, UR, LPF, POC: NEGATIVE
Crystals, Ur, HPF, POC: NEGATIVE
MUCUS UA: NEGATIVE
RBC, urine, microscopic: NEGATIVE
WBC, UR, HPF, POC: NEGATIVE
YEAST UA: NEGATIVE

## 2013-12-24 LAB — POCT GLYCOSYLATED HEMOGLOBIN (HGB A1C): Hemoglobin A1C: 5.5

## 2013-12-24 MED ORDER — SILDENAFIL CITRATE 100 MG PO TABS: 50.0000 mg | ORAL_TABLET | Freq: Every day | ORAL | Status: DC | PRN

## 2013-12-24 MED ORDER — TESTOSTERONE 20.25 MG/ACT (1.62%) TD GEL: TRANSDERMAL | Status: DC

## 2013-12-24 MED ORDER — LISINOPRIL 20 MG PO TABS: ORAL_TABLET | ORAL | Status: DC

## 2013-12-24 MED ORDER — ESCITALOPRAM OXALATE 10 MG PO TABS: ORAL_TABLET | ORAL | Status: DC

## 2013-12-24 MED ORDER — ALPRAZOLAM 0.25 MG PO TABS: ORAL_TABLET | ORAL | Status: DC

## 2013-12-24 NOTE — Addendum Note (Signed)
Addended by: Zannie Cove on: 12/24/2013 09:27 AM   Modules accepted: Orders

## 2013-12-24 NOTE — Patient Instructions (Addendum)
Continue current medications. Continue good therapeutic lifestyle changes which include good diet and exercise. Fall precautions discussed with patient. If an FOBT was given today- please return it to our front desk. If you are over 61 years old - you may need Prevnar 19 or the adult Pneumonia vaccine.  Flu Shots will be available at our office starting mid- September. Please call and schedule a FLU CLINIC APPOINTMENT.   Continue to work on the weight. Watch  sodium intake. Eat more healthy. Use Flonase over-the-counter 1-2 sprays each nostril at bedtime Try melatonin to help the rest better at nighttime

## 2013-12-24 NOTE — Progress Notes (Signed)
Subjective:    Patient ID: Arthur Foster, male    DOB: [birth year], 63 y.o.   MRN: [id number]  HPI Pt here for follow up and management of chronic medical problems. The patient did not take his blood pressure medication today. He has also not been checking his blood pressures at home. He is due to have a PSA and prostate and FOBT today in addition to his other lab work.        Patient Active Problem List   Diagnosis Date Noted  . Allergic rhinitis 06/18/2013  . Severe obesity (BMI >= 40) 06/18/2013  . BPH (benign prostatic hypertrophy) 07/03/2012  . Essential hypertension, benign 07/03/2012  . Depression 07/03/2012  . GERD (gastroesophageal reflux disease) 07/03/2012  . Testosterone deficiency 07/03/2012   Outpatient Encounter Prescriptions as of 12/24/2013  Medication Sig  . ALPRAZolam (XANAX) 0.25 MG tablet TAKE 1/2 TO 1 TABLET AT BEDTIME AS NEEDED  . ANDROGEL PUMP 20.25 MG/ACT (1.62%) GEL PLACE 4 PUMPS ONTO THE SKIN DAILY  . aspirin 81 MG tablet Take 81 mg by mouth daily.  [name] escitalopram (LEXAPRO) 10 MG tablet TAKE 1 TABLET EVERY DAY  . lisinopril (PRINIVIL,ZESTRIL) 20 MG tablet TAKE 1 TABLET EVERY DAY  . Multiple Vitamin (MULTIVITAMIN) tablet Take 1 tablet by mouth daily.  [name] omega-3 acid ethyl esters (LOVAZA) 1 G capsule TAKE 2 CAPSULES TWICE A DAY  . [DISCONTINUED] lisinopril (PRINIVIL,ZESTRIL) 20 MG tablet TAKE 1 TABLET EVERY DAY  . [DISCONTINUED] phentermine 37.5 MG capsule Take 1 capsule (37.5 mg total) by mouth every morning. Take 1/2 to 1 tablet by mouth each morning  . [DISCONTINUED] sulfamethoxazole-trimethoprim (BACTRIM DS) 800-160 MG per tablet Take 1 tablet by mouth 2 (two) times daily.    Review of Systems  Constitutional: Negative.   HENT: Negative.   Eyes: Negative.   Respiratory: Negative.   Cardiovascular: Negative.   Gastrointestinal: Negative.   Endocrine: Negative.   Genitourinary: Negative.   Musculoskeletal: Negative.   Skin: Negative.     Allergic/Immunologic: Negative.   Neurological: Negative.   Hematological: Negative.   Psychiatric/Behavioral: Negative.        Objective:   Physical Exam  Nursing note and vitals reviewed. Constitutional: He is oriented to person, place, and time. He appears well-developed and well-nourished. No distress.  HENT:  Head: Normocephalic and atraumatic.  Right Ear: External ear normal.  Left Ear: External ear normal.  Mouth/Throat: Oropharynx is clear and moist. No oropharyngeal exudate.   There is nasal congestion and turbinate swelling bilaterally  Eyes: Conjunctivae and EOM are normal. Pupils are equal, round, and reactive to light. Right eye exhibits no discharge. Left eye exhibits no discharge. No scleral icterus.  Neck: Normal range of motion. Neck supple. No thyromegaly present.  Cardiovascular: Normal rate, regular rhythm and normal heart sounds.  Exam reveals no gallop and no friction rub.   No murmur heard. At 72 per minute  Pulmonary/Chest: Effort normal and breath sounds normal. No respiratory distress. He has no wheezes. He has no rales. He exhibits no tenderness.  Abdominal: Soft. Bowel sounds are normal. He exhibits no mass. There is no tenderness. There is no rebound and no guarding.  Genitourinary: Rectum normal and penis normal.  The prostate is enlarged and smooth. There are no rectal masses. There is no inguinal hernia present. There no inguinal nodes. The external genitalia were normal.  Musculoskeletal: Normal range of motion. He exhibits no edema and no tenderness.  Lymphadenopathy:    He has no cervical  adenopathy.  Neurological: He is alert and oriented to person, place, and time. He has normal reflexes. No cranial nerve deficit.  Skin: Skin is warm and dry. No rash noted. No erythema. No pallor.  Psychiatric: He has a normal mood and affect. His behavior is normal. Judgment and thought content normal.   BP 158/91  Pulse 70  Temp(Src) 97.9 F (36.6 C) (Oral)   Ht 6' 1.5" (1.867 m)  Wt 318 lb (144.244 kg)  BMI 41.38 kg/m2        Assessment & Plan:  1. Gastroesophageal reflux disease, esophagitis presence not specified - POCT CBC  2. BPH (benign prostatic hypertrophy) - POCT CBC - PSA, total and free - POCT UA - Microscopic Only - POCT urinalysis dipstick  3. Essential hypertension, benign - POCT CBC - BMP8+EGFR - Hepatic function panel - NMR, lipoprofile  4. Testosterone deficiency - POCT CBC - Testosterone,Free and Total - PSA, total and free - POCT UA - Microscopic Only - POCT urinalysis dipstick  5. Vitamin D deficiency - Vit D  25 hydroxy (rtn osteoporosis monitoring)  6. Metabolic syndrome - POCT glycosylated hemoglobin (Hb A1C)  7. Insomnia  8. Allergic rhinitis due to pollen   Meds ordered this encounter  Medications  . Testosterone (ANDROGEL PUMP) 20.25 MG/ACT (1.62%) GEL    Sig: PLACE 4 PUMPS ONTO THE SKIN DAILY    Dispense:  150 g    Refill:  5    Not to exceed 3 additional fills before 12/15/2013.  [name] escitalopram (LEXAPRO) 10 MG tablet    Sig: TAKE 1 TABLET EVERY DAY    Dispense:  90 tablet    Refill:  3  . lisinopril (PRINIVIL,ZESTRIL) 20 MG tablet    Sig: TAKE 1 TABLET EVERY DAY    Dispense:  90 tablet    Refill:  3  . ALPRAZolam (XANAX) 0.25 MG tablet    Sig: TAKE 1 TO 2 TABLET AT daily AS NEEDED    Dispense:  60 tablet    Refill:  3    Not to exceed 3 additional fills before 12/16/2013  . sildenafil (VIAGRA) 100 MG tablet    Sig: Take 0.5-1 tablets (50-100 mg total) by mouth daily as needed for erectile dysfunction.    Dispense:  12 tablet    Refill:  3   Patient Instructions  Continue current medications. Continue good therapeutic lifestyle changes which include good diet and exercise. Fall precautions discussed with patient. If an FOBT was given today- please return it to our front desk. If you are over 46 years old - you may need Prevnar 10 or the adult Pneumonia vaccine.  Flu  Shots will be available at our office starting mid- September. Please call and schedule a FLU CLINIC APPOINTMENT.   Continue to work on the weight. Watch  sodium intake. Eat more healthy. Use Flonase over-the-counter 1-2 sprays each nostril at bedtime Try melatonin to help the rest better at nighttime   Arrie Senate MD

## 2013-12-25 LAB — HEPATIC FUNCTION PANEL
ALK PHOS: 56 IU/L (ref 39–117)
ALT: 34 IU/L (ref 0–44)
AST: 25 IU/L (ref 0–40)
Albumin: 4.5 g/dL (ref 3.6–4.8)
Bilirubin, Direct: 0.22 mg/dL (ref 0.00–0.40)
TOTAL PROTEIN: 6.9 g/dL (ref 6.0–8.5)
Total Bilirubin: 1 mg/dL (ref 0.0–1.2)

## 2013-12-25 LAB — BMP8+EGFR
BUN/Creatinine Ratio: 18 (ref 10–22)
BUN: 20 mg/dL (ref 8–27)
CHLORIDE: 100 mmol/L (ref 97–108)
CO2: 22 mmol/L (ref 18–29)
Calcium: 9.3 mg/dL (ref 8.6–10.2)
Creatinine, Ser: 1.13 mg/dL (ref 0.76–1.27)
GFR calc non Af Amer: 70 mL/min/{1.73_m2} (ref >59)
GFR, EST AFRICAN AMERICAN: 81 mL/min/{1.73_m2} (ref >59)
GLUCOSE: 112 mg/dL — AB (ref 65–99)
POTASSIUM: 5.1 mmol/L (ref 3.5–5.2)
Sodium: 139 mmol/L (ref 134–144)

## 2013-12-25 LAB — NMR, LIPOPROFILE
CHOLESTEROL: 213 mg/dL — AB (ref 100–199)
HDL CHOLESTEROL BY NMR: 44 mg/dL (ref >39)
HDL Particle Number: 32.7 umol/L (ref >=30.5)
LDL Particle Number: 1716 nmol/L — ABNORMAL HIGH (ref <1000)
LDL Size: 21.2 nm (ref >20.5)
LDLC SERPL CALC-MCNC: 142 mg/dL — AB (ref 0–99)
LP-IR SCORE: 74 — AB (ref <=45)
Small LDL Particle Number: 597 nmol/L — ABNORMAL HIGH (ref <=527)
Triglycerides by NMR: 137 mg/dL (ref 0–149)

## 2013-12-25 LAB — THYROID PANEL WITH TSH
Free Thyroxine Index: 1.9 (ref 1.2–4.9)
T3 UPTAKE RATIO: 30 % (ref 24–39)
T4, Total: 6.3 ug/dL (ref 4.5–12.0)
TSH: 2.64 u[IU]/mL (ref 0.450–4.500)

## 2013-12-25 LAB — PSA, TOTAL AND FREE
PSA FREE PCT: 16.7 %
PSA, Free: 0.15 ng/mL
PSA: 0.9 ng/mL (ref 0.0–4.0)

## 2013-12-25 LAB — TESTOSTERONE,FREE AND TOTAL
Testosterone, Free: 7.4 pg/mL (ref 6.6–18.1)
Testosterone: 172 ng/dL — ABNORMAL LOW (ref 348–1197)

## 2013-12-25 LAB — VITAMIN D 25 HYDROXY (VIT D DEFICIENCY, FRACTURES): Vit D, 25-Hydroxy: 30.6 ng/mL (ref 30.0–100.0)

## 2013-12-29 ENCOUNTER — Telehealth: Payer: Self-pay | Admitting: Family Medicine

## 2013-12-29 NOTE — Telephone Encounter (Signed)
Message copied by Waverly Ferrari on Mon Dec 29, 2013 11:24 AM ------      Message from: Zannie Cove      Created: Thu Dec 25, 2013  3:01 PM                   ----- Message -----         From: Chipper Herb, MD         Sent: 12/25/2013   1:54 PM           To: Cameron Proud Bullins, LPN            The blood sugar was elevated at 112. The creatinine the most important kidney function test was within normal limits. The electrolytes including potassium were within normal limits.      Liver function tests were within normal      We have advanced lipid testing a total LDL particle number remains elevated at 1716. The LDL C. is elevated at 142. Triglycerides are within normal limits.----Please start this patient on a statin drug. Crestor 10 mg 1 daily number 90 with a coupon for $3 for 3 months. Please make sure the patient gets liver function tests in 4 weeks to give him a zetia diet sheet and tell him that since he does the cooking, he must do a better job with her eating more healthy      The vitamin D level is at the low end of the normal range. Please confirm that the patient is taking vitamin D regularly. If he is not, he should start vitamin D3 1000 daily--- the now brand      The total testosterone level remains low. Please confirm that he is using his AndroGel correctly i.e. 4 pumps daily++++++      The PSA remains low and within normal limit      All thyroid function tests are within normal ------

## 2014-01-08 ENCOUNTER — Ambulatory Visit (INDEPENDENT_AMBULATORY_CARE_PROVIDER_SITE_OTHER): Payer: BC Managed Care – PPO

## 2014-01-08 DIAGNOSIS — Z23 Encounter for immunization: Secondary | ICD-10-CM

## 2014-01-28 ENCOUNTER — Other Ambulatory Visit: Payer: Self-pay | Admitting: *Deleted

## 2014-01-28 MED ORDER — SILDENAFIL CITRATE 20 MG PO TABS: ORAL_TABLET | ORAL | Status: DC

## 2014-01-28 NOTE — Telephone Encounter (Signed)
Med changed and sent  

## 2014-01-29 ENCOUNTER — Other Ambulatory Visit: Payer: Self-pay | Admitting: Family Medicine

## 2014-02-02 NOTE — Telephone Encounter (Signed)
Patient last seen in office on 12-24-13. Rx last filled on 12-30-13 for #150g. Please advise. If approved please route to pool A so nurse can call patient to pick up. Rx will print

## 2014-02-02 NOTE — Telephone Encounter (Signed)
This is okay for refills

## 2014-03-23 ENCOUNTER — Other Ambulatory Visit: Payer: Self-pay | Admitting: Family Medicine

## 2014-04-15 ENCOUNTER — Other Ambulatory Visit: Payer: Self-pay | Admitting: *Deleted

## 2014-04-15 ENCOUNTER — Telehealth: Payer: Self-pay | Admitting: *Deleted

## 2014-04-15 MED ORDER — TESTOSTERONE 50 MG/5GM (1%) TD GEL: 5.0000 g | Freq: Every day | TRANSDERMAL | Status: DC

## 2014-04-15 NOTE — Telephone Encounter (Signed)
Try Testim 1% gel tube.  apply 50 mg every morning------ recheck testosterone level III months after starting

## 2014-04-15 NOTE — Telephone Encounter (Signed)
Pt aware.

## 2014-04-15 NOTE — Telephone Encounter (Signed)
Lm for pt to call and discuss change with Roselyn Reef B

## 2014-04-15 NOTE — Telephone Encounter (Signed)
DR Laurance Flatten, ins willl not cover androgel unless he has tried and failed 1- androderm 2-testim  Hope one of these will work.

## 2014-04-21 ENCOUNTER — Other Ambulatory Visit: Payer: Self-pay | Admitting: *Deleted

## 2014-04-21 MED ORDER — SILDENAFIL CITRATE 20 MG PO TABS: ORAL_TABLET | ORAL | Status: DC

## 2014-05-27 ENCOUNTER — Other Ambulatory Visit: Payer: Self-pay | Admitting: Family Medicine

## 2014-05-27 NOTE — Telephone Encounter (Signed)
Please review paper record and give the patient a one-month supply of his previous treatment for appetite suppressant with no refill

## 2014-05-28 MED ORDER — PHENTERMINE HCL 37.5 MG PO CAPS
37.5000 mg | ORAL_CAPSULE | ORAL | Status: DC
Start: 2014-05-28 — End: 2014-11-05

## 2014-05-29 ENCOUNTER — Other Ambulatory Visit: Payer: Self-pay | Admitting: Family Medicine

## 2014-06-01 NOTE — Telephone Encounter (Signed)
Last seen 12/24/13 DWM  If approved route to nurse to call into [facility]

## 2014-06-25 ENCOUNTER — Ambulatory Visit (INDEPENDENT_AMBULATORY_CARE_PROVIDER_SITE_OTHER): Payer: 59

## 2014-06-25 ENCOUNTER — Encounter: Payer: Self-pay | Admitting: Family Medicine

## 2014-06-25 ENCOUNTER — Ambulatory Visit (INDEPENDENT_AMBULATORY_CARE_PROVIDER_SITE_OTHER): Payer: 59 | Admitting: Family Medicine

## 2014-06-25 VITALS — BP 122/73 | HR 66 | Temp 97.5°F | Ht 73.5 in | Wt 302.0 lb

## 2014-06-25 DIAGNOSIS — K219 Gastro-esophageal reflux disease without esophagitis: Secondary | ICD-10-CM

## 2014-06-25 DIAGNOSIS — E8881 Metabolic syndrome: Secondary | ICD-10-CM

## 2014-06-25 DIAGNOSIS — N5201 Erectile dysfunction due to arterial insufficiency: Secondary | ICD-10-CM | POA: Diagnosis not present

## 2014-06-25 DIAGNOSIS — K5901 Slow transit constipation: Secondary | ICD-10-CM

## 2014-06-25 DIAGNOSIS — E559 Vitamin D deficiency, unspecified: Secondary | ICD-10-CM

## 2014-06-25 DIAGNOSIS — E291 Testicular hypofunction: Secondary | ICD-10-CM

## 2014-06-25 DIAGNOSIS — Z0000 Encounter for general adult medical examination without abnormal findings: Secondary | ICD-10-CM

## 2014-06-25 DIAGNOSIS — I10 Essential (primary) hypertension: Secondary | ICD-10-CM | POA: Diagnosis not present

## 2014-06-25 DIAGNOSIS — E349 Endocrine disorder, unspecified: Secondary | ICD-10-CM

## 2014-06-25 DIAGNOSIS — N400 Enlarged prostate without lower urinary tract symptoms: Secondary | ICD-10-CM

## 2014-06-25 LAB — POCT URINALYSIS DIPSTICK
Bilirubin, UA: NEGATIVE
Blood, UA: NEGATIVE
Glucose, UA: NEGATIVE
Ketones, UA: NEGATIVE
LEUKOCYTES UA: NEGATIVE
NITRITE UA: NEGATIVE
PROTEIN UA: NEGATIVE
Spec Grav, UA: 1.01
Urobilinogen, UA: NEGATIVE
pH, UA: 6.5

## 2014-06-25 LAB — POCT CBC
Granulocyte percent: 57 %G (ref 37–80)
HCT, POC: 50.5 % (ref 43.5–53.7)
Hemoglobin: 15.6 g/dL (ref 14.1–18.1)
LYMPH, POC: 2 (ref 0.6–3.4)
MCH, POC: 27.8 pg (ref 27–31.2)
MCHC: 31 g/dL — AB (ref 31.8–35.4)
MCV: 89.6 fL (ref 80–97)
MPV: 7.9 fL (ref 0–99.8)
POC Granulocyte: 3.5 (ref 2–6.9)
POC LYMPH PERCENT: 32.7 %L (ref 10–50)
Platelet Count, POC: 353 10*3/uL (ref 142–424)
RBC: 5.63 M/uL (ref 4.69–6.13)
RDW, POC: 12.5 %
WBC: 6.1 10*3/uL (ref 4.6–10.2)

## 2014-06-25 LAB — POCT UA - MICROSCOPIC ONLY
BACTERIA, U MICROSCOPIC: NEGATIVE
Casts, Ur, LPF, POC: NEGATIVE
Crystals, Ur, HPF, POC: NEGATIVE
MUCUS UA: NEGATIVE
Yeast, UA: NEGATIVE

## 2014-06-25 NOTE — Patient Instructions (Addendum)
Continue current medications. Continue good therapeutic lifestyle changes which include good diet and exercise. Fall precautions discussed with patient. If an FOBT was given today- please return it to our front desk. If you are over 62 years old - you may need Prevnar 51 or the adult Pneumonia vaccine.  Flu Shots are still available at our office. If you still haven't had one please call to set up a nurse visit to get one.   After your visit with [place] today you will receive a survey in the mail or online from [employer] regarding your care with [place]. Please take a moment to fill this out. Your feedback is very important to [place] as you can help [place] better understand your patient needs as well as improve your experience and satisfaction. WE CARE ABOUT YOU!!!   Continue with diet Try prune juice and continue with drinking lots of water You can also try apple juice as you discussed and see if that helps We will arrange for you to have a stress test sometime later this coming summer. Keep working on the weight and monitor blood pressure at home when possible Try Cialis as directed

## 2014-06-25 NOTE — Progress Notes (Signed)
Subjective:    Patient ID: Arthur Foster, male    DOB: 09/25/1950, 64 y.o.   MRN: [id number]  HPI Patient is here today for annual wellness exam and follow up of chronic medical problems which includes hypertension, BPH, and GERD. He is taking medications regularly. The patient also has testosterone deficiency. He is now using Testim and set of AndroGel. He also has had some constipation and he attributes this to new diet habits which have resulted in weight loss but has increased problems with his constipation. He is using Dulcolax tablets periodically and drinking plenty of water but still having problems with constipation. He also has some concerns with his decreased sexual drive. He denies chest pain shortness of breath trouble swallowing and now has decreased heartburn compared to the past. No problems were there with voiding.          Patient Active Problem List   Diagnosis Date Noted  . Allergic rhinitis 06/18/2013  . Severe obesity (BMI >= 40) 06/18/2013  . BPH (benign prostatic hypertrophy) 07/03/2012  . Essential hypertension, benign 07/03/2012  . Depression 07/03/2012  . GERD (gastroesophageal reflux disease) 07/03/2012  . Testosterone deficiency 07/03/2012   Outpatient Encounter Prescriptions as of 06/25/2014  Medication Sig  . ALPRAZolam (XANAX) 0.25 MG tablet TAKE 1 TO 2 TABLETS EVERY DAY AS NEEDED  . aspirin 81 MG tablet Take 81 mg by mouth daily.  [name] escitalopram (LEXAPRO) 10 MG tablet TAKE 1 TABLET EVERY DAY  . lisinopril (PRINIVIL,ZESTRIL) 20 MG tablet TAKE 1 TABLET EVERY DAY  . Multiple Vitamin (MULTIVITAMIN) tablet Take 1 tablet by mouth daily.  [name] omega-3 acid ethyl esters (LOVAZA) 1 G capsule TAKE 2 CAPSULES TWICE A DAY  . phentermine 37.5 MG capsule Take 1 capsule (37.5 mg total) by mouth every morning.  . testosterone (ANDROGEL) 50 MG/5GM (1%) GEL Place 5 g onto the skin daily.  . [DISCONTINUED] sildenafil (REVATIO) 20 MG tablet Take 3 tabs daily PRN  .  [DISCONTINUED] ANDROGEL PUMP 20.25 MG/ACT (1.62%) GEL PLACE 4 PUMPS ONTO THE SKIN DAILY  . [DISCONTINUED] sildenafil (VIAGRA) 100 MG tablet Take 0.5-1 tablets (50-100 mg total) by mouth daily as needed for erectile dysfunction.    Review of Systems  Constitutional: Negative.   HENT: Negative.   Eyes: Negative.   Respiratory: Negative.   Cardiovascular: Negative.   Gastrointestinal: Positive for constipation.  Endocrine: Negative.   Genitourinary: Negative.   Musculoskeletal: Negative.   Skin: Negative.   Allergic/Immunologic: Negative.   Neurological: Negative.   Hematological: Negative.   Psychiatric/Behavioral: Negative.        Objective:   Physical Exam  Constitutional: He is oriented to person, place, and time. He appears well-developed and well-nourished. No distress.  The patient is alert and positive and in good spirits.  HENT:  Head: Normocephalic and atraumatic.  Right Ear: External ear normal.  Left Ear: External ear normal.  Mouth/Throat: Oropharynx is clear and moist. No oropharyngeal exudate.  Some nasal congestion bilaterally  Eyes: Conjunctivae and EOM are normal. Pupils are equal, round, and reactive to light. Right eye exhibits no discharge. Left eye exhibits no discharge. No scleral icterus.  Neck: Normal range of motion. Neck supple. No thyromegaly present.  Cardiovascular: Normal rate, regular rhythm, normal heart sounds and intact distal pulses.   No murmur heard. At 72/m  Pulmonary/Chest: Effort normal and breath sounds normal. No respiratory distress. He has no wheezes. He has no rales. He exhibits no tenderness.  Clear anteriorly and posteriorly and  axillary negative for adenopathy  Abdominal: Soft. Bowel sounds are normal. He exhibits no mass. There is no tenderness. There is no rebound and no guarding.  Obese without masses or tenderness  Genitourinary: Rectum normal and penis normal.  The prostate was enlarged but smooth and without lumps. There  were no rectal masses.  Musculoskeletal: Normal range of motion. He exhibits no edema or tenderness.  Lymphadenopathy:    He has no cervical adenopathy.  Neurological: He is alert and oriented to person, place, and time. He has normal reflexes. No cranial nerve deficit.  Skin: Skin is warm and dry. No rash noted. No erythema. No pallor.  Psychiatric: He has a normal mood and affect. His behavior is normal. Judgment and thought content normal.  Nursing note and vitals reviewed.  BP 122/73 mmHg  Pulse 66  Temp(Src) 97.5 F (36.4 C) (Oral)  Ht 6' 1.5" (1.867 m)  Wt 302 lb (136.986 kg)  BMI 39.30 kg/m2  EKG: No significant changes from previous EKG  [facility] reading (PRIMARY) by  Dr. Brunilda Payor x-ray--   no active disease                               .       Assessment & Plan:  1. Gastroesophageal reflux disease, esophagitis presence not specified -This has improved with his weight loss and he will continue with his weight loss program. - POCT CBC - Hepatic function panel  2. BPH (benign prostatic hypertrophy) -The patient is having no problems with this and the exam today was unchanged from the past without masses or lumps. - POCT CBC  3. Essential hypertension, benign -This was improved today and most likely due to his weight loss regimen that he is following - POCT CBC - BMP8+EGFR - Hepatic function panel - Lipid panel - DG Chest 2 View; Future  4. Testosterone deficiency -The patient will continue with the Testim for testosterone replacement and weight loss as this will improve his sexual drive and appetite - POCT CBC - POCT UA - Microscopic Only - POCT urinalysis dipstick - Vit D  25 hydroxy (rtn osteoporosis monitoring) - PSA, total and free - Testosterone,Free and Total  5. Vitamin D deficiency -Continue with current treatment pending results of lab work - POCT CBC  6. Metabolic syndrome -Continue with weight loss regimen - POCT CBC - BMP8+EGFR  7.  Erectile dysfunction due to arterial insufficiency -Continue with weight loss regimen, Testim topically and patient given samples of Cialis to try to see if this will help.  8. Constipation -Prune juice, MiraLAX, and continued fluid intake along with bulk and fiber in the diet were discussed with patient.  No orders of the defined types were placed in this encounter.   Patient Instructions  Continue current medications. Continue good therapeutic lifestyle changes which include good diet and exercise. Fall precautions discussed with patient. If an FOBT was given today- please return it to our front desk. If you are over 31 years old - you may need Prevnar 21 or the adult Pneumonia vaccine.  Flu Shots are still available at our office. If you still haven't had one please call to set up a nurse visit to get one.   After your visit with [place] today you will receive a survey in the mail or online from [employer] regarding your care with [place]. Please take a moment to fill this out. Your feedback is very  important to [place] as you can help [place] better understand your patient needs as well as improve your experience and satisfaction. WE CARE ABOUT YOU!!!   Continue with diet Try prune juice and continue with drinking lots of water You can also try apple juice as you discussed and see if that helps We will arrange for you to have a stress test sometime later this coming summer. Keep working on the weight and monitor blood pressure at home when possible Try Cialis as directed   Arrie Senate MD

## 2014-06-26 LAB — PSA, TOTAL AND FREE
PSA FREE PCT: 19.1 %
PSA, Free: 0.21 ng/mL
PSA: 1.1 ng/mL (ref 0.0–4.0)

## 2014-06-26 LAB — HEPATIC FUNCTION PANEL
ALK PHOS: 65 IU/L (ref 39–117)
ALT: 42 IU/L (ref 0–44)
AST: 32 IU/L (ref 0–40)
Albumin: 4.5 g/dL (ref 3.6–4.8)
Bilirubin Total: 1.2 mg/dL (ref 0.0–1.2)
Bilirubin, Direct: 0.25 mg/dL (ref 0.00–0.40)
Total Protein: 6.9 g/dL (ref 6.0–8.5)

## 2014-06-26 LAB — TESTOSTERONE,FREE AND TOTAL
TESTOSTERONE: 174 ng/dL — AB (ref 348–1197)
Testosterone, Free: 6.7 pg/mL (ref 6.6–18.1)

## 2014-06-26 LAB — LIPID PANEL
CHOL/HDL RATIO: 4.2 ratio (ref 0.0–5.0)
Cholesterol, Total: 158 mg/dL (ref 100–199)
HDL: 38 mg/dL — ABNORMAL LOW (ref >39)
LDL CALC: 98 mg/dL (ref 0–99)
Triglycerides: 108 mg/dL (ref 0–149)
VLDL CHOLESTEROL CAL: 22 mg/dL (ref 5–40)

## 2014-06-26 LAB — BMP8+EGFR
BUN / CREAT RATIO: 19 (ref 10–22)
BUN: 20 mg/dL (ref 8–27)
CO2: 19 mmol/L (ref 18–29)
Calcium: 9.5 mg/dL (ref 8.6–10.2)
Chloride: 100 mmol/L (ref 97–108)
Creatinine, Ser: 1.07 mg/dL (ref 0.76–1.27)
GFR calc Af Amer: 86 mL/min/{1.73_m2} (ref >59)
GFR calc non Af Amer: 75 mL/min/{1.73_m2} (ref >59)
Glucose: 101 mg/dL — ABNORMAL HIGH (ref 65–99)
Potassium: 4.8 mmol/L (ref 3.5–5.2)
SODIUM: 138 mmol/L (ref 134–144)

## 2014-06-26 LAB — VITAMIN D 25 HYDROXY (VIT D DEFICIENCY, FRACTURES): Vit D, 25-Hydroxy: 29.8 ng/mL — ABNORMAL LOW (ref 30.0–100.0)

## 2014-08-03 ENCOUNTER — Other Ambulatory Visit: Payer: Self-pay | Admitting: Family Medicine

## 2014-08-04 NOTE — Telephone Encounter (Signed)
Last seen 06/30/14 DWM  If approved route to nurse to call into [facility]

## 2014-08-15 ENCOUNTER — Other Ambulatory Visit: Payer: Self-pay | Admitting: Family Medicine

## 2014-08-25 ENCOUNTER — Encounter: Payer: Self-pay | Admitting: Family Medicine

## 2014-08-25 ENCOUNTER — Encounter: Payer: 59 | Admitting: Family Medicine

## 2014-08-25 NOTE — Progress Notes (Signed)
   Subjective:    Patient ID: Arthur Foster, male    DOB: [birth year], 64 y.o.   MRN: [id number]  HPI Patient here today for suture removal of left knee area. The sutures were placed by [facility] urgent care on 08/15/14.     This encounter was created in error - please disregard.        Patient Active Problem List   Diagnosis Date Noted  . Allergic rhinitis 06/18/2013  . Severe obesity (BMI >= 40) 06/18/2013  . BPH (benign prostatic hypertrophy) 07/03/2012  . Essential hypertension, benign 07/03/2012  . Depression 07/03/2012  . GERD (gastroesophageal reflux disease) 07/03/2012  . Testosterone deficiency 07/03/2012   Outpatient Encounter Prescriptions as of 08/25/2014  Medication Sig  . ALPRAZolam (XANAX) 0.25 MG tablet TAKE 1 TO 2 TABLETS BY MOUTH DAILY AS NEEDED  . aspirin 81 MG tablet Take 81 mg by mouth daily.  [name] escitalopram (LEXAPRO) 10 MG tablet TAKE 1 TABLET EVERY DAY  . lisinopril (PRINIVIL,ZESTRIL) 20 MG tablet TAKE 1 TABLET EVERY DAY  . lisinopril (PRINIVIL,ZESTRIL) 20 MG tablet TAKE 1 TABLET EVERY DAY  . Multiple Vitamin (MULTIVITAMIN) tablet Take 1 tablet by mouth daily.  [name] omega-3 acid ethyl esters (LOVAZA) 1 G capsule TAKE 2 CAPSULES TWICE A DAY  . phentermine 37.5 MG capsule Take 1 capsule (37.5 mg total) by mouth every morning.  . testosterone (ANDROGEL) 50 MG/5GM (1%) GEL Place 5 g onto the skin daily.   No facility-administered encounter medications on file as of 08/25/2014.     Review of Systems  Constitutional: Negative.   HENT: Negative.   Eyes: Negative.   Respiratory: Negative.   Cardiovascular: Negative.   Gastrointestinal: Negative.   Endocrine: Negative.   Genitourinary: Negative.   Musculoskeletal: Negative.   Skin: Positive for wound (left - above the knee).  Allergic/Immunologic: Negative.   Neurological: Negative.   Hematological: Negative.   Psychiatric/Behavioral: Negative.        Objective:   Physical Exam BP 149/80 mmHg  Pulse  78  Temp(Src) 97.6 F (36.4 C) (Oral)  Ht 6' 1.5" (1.867 m)  Wt 303 lb (137.44 kg)  BMI 39.43 kg/m2        Assessment & Plan:

## 2014-09-01 ENCOUNTER — Encounter: Payer: Self-pay | Admitting: *Deleted

## 2014-09-02 ENCOUNTER — Other Ambulatory Visit: Payer: Self-pay | Admitting: Family Medicine

## 2014-09-02 NOTE — Telephone Encounter (Signed)
testem rx ready for pick up

## 2014-09-13 ENCOUNTER — Other Ambulatory Visit: Payer: Self-pay | Admitting: Family Medicine

## 2014-09-14 ENCOUNTER — Other Ambulatory Visit: Payer: Self-pay | Admitting: Family Medicine

## 2014-09-17 ENCOUNTER — Telehealth: Payer: Self-pay

## 2014-09-17 NOTE — Telephone Encounter (Signed)
Insurance prior authorized Omega-2 Acid Ethyl PA [id number] until 09/15/15

## 2014-10-08 ENCOUNTER — Other Ambulatory Visit: Payer: Self-pay | Admitting: Family Medicine

## 2014-10-08 NOTE — Telephone Encounter (Signed)
Last seen 06/25/14 DWM  If approved print

## 2014-10-09 ENCOUNTER — Other Ambulatory Visit: Payer: Self-pay | Admitting: *Deleted

## 2014-10-09 MED ORDER — TESTOSTERONE 50 MG/5GM (1%) TD GEL: TRANSDERMAL | Status: DC

## 2014-10-09 NOTE — Telephone Encounter (Signed)
Pt requested 2 refills on Testim- to last until his SEPT appt with Laurance Flatten

## 2014-10-18 ENCOUNTER — Other Ambulatory Visit: Payer: Self-pay | Admitting: Family Medicine

## 2014-11-05 ENCOUNTER — Ambulatory Visit (INDEPENDENT_AMBULATORY_CARE_PROVIDER_SITE_OTHER): Payer: 59

## 2014-11-05 ENCOUNTER — Encounter: Payer: Self-pay | Admitting: Family Medicine

## 2014-11-05 ENCOUNTER — Ambulatory Visit (INDEPENDENT_AMBULATORY_CARE_PROVIDER_SITE_OTHER): Payer: 59 | Admitting: Family Medicine

## 2014-11-05 ENCOUNTER — Telehealth: Payer: Self-pay | Admitting: Family Medicine

## 2014-11-05 ENCOUNTER — Encounter (INDEPENDENT_AMBULATORY_CARE_PROVIDER_SITE_OTHER): Payer: Self-pay

## 2014-11-05 VITALS — BP 129/83 | HR 72 | Temp 97.4°F | Ht 73.0 in | Wt 307.8 lb

## 2014-11-05 DIAGNOSIS — M25562 Pain in left knee: Secondary | ICD-10-CM | POA: Diagnosis not present

## 2014-11-05 DIAGNOSIS — J309 Allergic rhinitis, unspecified: Secondary | ICD-10-CM | POA: Diagnosis not present

## 2014-11-05 DIAGNOSIS — R05 Cough: Secondary | ICD-10-CM | POA: Diagnosis not present

## 2014-11-05 DIAGNOSIS — R059 Cough, unspecified: Secondary | ICD-10-CM

## 2014-11-05 DIAGNOSIS — K219 Gastro-esophageal reflux disease without esophagitis: Secondary | ICD-10-CM

## 2014-11-05 MED ORDER — CETIRIZINE HCL 10 MG PO TABS: 10.0000 mg | ORAL_TABLET | Freq: Every day | ORAL | Status: DC

## 2014-11-05 MED ORDER — PHENTERMINE HCL 37.5 MG PO CAPS: 37.5000 mg | ORAL_CAPSULE | ORAL | Status: DC

## 2014-11-05 MED ORDER — DICLOFENAC SODIUM 75 MG PO TBEC: 75.0000 mg | DELAYED_RELEASE_TABLET | Freq: Two times a day (BID) | ORAL | Status: DC

## 2014-11-05 MED ORDER — BENZONATATE 100 MG PO CAPS: 100.0000 mg | ORAL_CAPSULE | Freq: Two times a day (BID) | ORAL | Status: DC | PRN

## 2014-11-05 NOTE — Assessment & Plan Note (Signed)
No change in therapy, however considering as possibility for cough if this develops into a chronic cough

## 2014-11-05 NOTE — Progress Notes (Signed)
Patient ID: Arthur Foster, male   DOB: [birth year], 64 y.o.   MRN: [id number]   HPI  Patient presents today for left knee pain and cough  Left knee pain  patient explains that he's had 6 weeks of persistent left knee pain. It began hurting after a twisting motion when he pivoted on his left knee. He describes it as anterior knee pain with posterior knee pain after working. He has pain going down stairs. He denies locking or popping symptoms. He feels he has slight swelling at times. He's tried a knee brace which has metal braces on a his medial and lateral sides which actually hurt his knee more. He's tried glucosamine which has helped his right shoulder pain but not his knee pain.  Cough 2 weeks of cough, mostly dry but frequent throat clearing early in the morning. Has allergies, not taking any medications at this time. States they're not really bothering him at this time however. No malaise, dyspnea, or fever. States that he does not feel ill but wanted to make sure that the cough was not becoming pneumonia.  He requests refill on phentermine due to weight gain. I refilled this for one month that did not have time to discuss it this visit  PMH: Smoking status noted ROS: Per HPI, otherwise negative  Objective: BP 129/83 mmHg  Pulse 72  Temp(Src) 97.4 F (36.3 C) (Oral)  Ht 6\' 1"  (1.854 m)  Wt 307 lb 12.8 oz (139.617 kg)  BMI 40.62 kg/m2 Gen: NAD, alert, cooperative with exam HEENT: NCAT, swollen boggy turbinates bilaterally, oropharynx clear, noisy breathing through his nose CV: RRR, good S1/S2, no murmur Resp: CTABL, no wheezes, non-labored Ext: No edema, warm, hair loss on bilateral lower extremities Neuro: Alert and oriented, No gross deficits  Assessment and plan:  Allergic rhinitis Start plain Zyrtec, suspect postnasal drip has current etiology for cough  Cough 2 weeks of cough, no systemic symptoms and no fever Signs of severe allergic rhinitis on physical exam with  swollen turbinates, also has frequent throat clearing in the early a.m. Start Zyrtec, given Best boy for cough suppression Chest x-ray today pending  Patient to call if he develops signs of systemic illness including malaise, loss of appetite, fever, or dyspnea  Left knee pain No definitively clear etiology Considering arthritis versus meniscal tear as most likely Plain film today to evaluate for possibility of arthritis No significant history of critical signs of meniscal injury Discussed possibility of cortisone the injection, offered, patient to return if he desires to have that For now conservative treatment with a knee sleeve with work, icing after work, and anti-inflammatory-scheduled for 5 days then when necessary  GERD (gastroesophageal reflux disease) No change in therapy, however considering as possibility for cough if this develops into a chronic cough   Orders Placed This Encounter  Procedures  . DG Chest 2 View    Standing Status: Future     Number of Occurrences: 1     Standing Expiration Date: 01/05/2016    Order Specific Question:  Reason for Exam (SYMPTOM  OR DIAGNOSIS REQUIRED)    Answer:  cough    Order Specific Question:  Preferred imaging location?    Answer:  Internal  . DG Knee 1-2 Views Left    Standing Status: Future     Number of Occurrences: 1     Standing Expiration Date: 01/05/2016    Order Specific Question:  Reason for Exam (SYMPTOM  OR DIAGNOSIS REQUIRED)    Answer:  left knee pain    Order Specific Question:  Preferred imaging location?    Answer:  Internal    Meds ordered this encounter  Medications  . glucosamine-chondroitin 500-400 MG tablet    Sig: Take 1 tablet by mouth 3 (three) times daily.  . cetirizine (ZYRTEC) 10 MG tablet    Sig: Take 1 tablet (10 mg total) by mouth daily.    Dispense:  30 tablet    Refill:  11  . benzonatate (TESSALON) 100 MG capsule    Sig: Take 1 capsule (100 mg total) by mouth 2 (two) times daily as  needed for cough.    Dispense:  20 capsule    Refill:  0  . diclofenac (VOLTAREN) 75 MG EC tablet    Sig: Take 1 tablet (75 mg total) by mouth 2 (two) times daily.    Dispense:  30 tablet    Refill:  0  . phentermine 37.5 MG capsule    Sig: Take 1 capsule (37.5 mg total) by mouth every morning.    Dispense:  30 capsule    Refill:  0

## 2014-11-05 NOTE — Assessment & Plan Note (Signed)
Start plain Zyrtec, suspect postnasal drip has current etiology for cough

## 2014-11-05 NOTE — Assessment & Plan Note (Signed)
No definitively clear etiology Considering arthritis versus meniscal tear as most likely Plain film today to evaluate for possibility of arthritis No significant history of critical signs of meniscal injury Discussed possibility of cortisone the injection, offered, patient to return if he desires to have that For now conservative treatment with a knee sleeve with work, icing after work, and anti-inflammatory-scheduled for 5 days then when necessary

## 2014-11-05 NOTE — Patient Instructions (Signed)
Great to meet you!  Please let us know if your cough is not improving in a week or if you develop fever or shortness of breath  Use a knee sleeve while woirking, and use the diclofenac twice daily for the next 5 days

## 2014-11-05 NOTE — Telephone Encounter (Signed)
Called and discussed x-ray findings  Chest x-ray clear  Plain film of bilateral knees with very very mild medial compartment joint space narrowing. However I explained this is very early signs of OA and likely not the pain generator in his knees. Continue current plan with scheduled diclofenac twice daily 5 days then when necessary, sleeve with working, ice after work.  Laroy Apple, MD 11/05/2014, 11:25 AM

## 2014-11-05 NOTE — Assessment & Plan Note (Signed)
2 weeks of cough, no systemic symptoms and no fever Signs of severe allergic rhinitis on physical exam with swollen turbinates, also has frequent throat clearing in the early a.m. Start Zyrtec, given Tessalon Perles for cough suppression Chest x-ray today pending  Patient to call if he develops signs of systemic illness including malaise, loss of appetite, fever, or dyspnea

## 2014-11-06 ENCOUNTER — Ambulatory Visit: Payer: 59 | Admitting: Family Medicine

## 2014-11-13 ENCOUNTER — Other Ambulatory Visit: Payer: Self-pay

## 2014-11-13 MED ORDER — LISINOPRIL 20 MG PO TABS: ORAL_TABLET | ORAL | Status: DC

## 2014-11-13 MED ORDER — ALPRAZOLAM 0.25 MG PO TABS: ORAL_TABLET | ORAL | Status: DC

## 2014-11-13 NOTE — Telephone Encounter (Signed)
rx called to pharmacy 

## 2014-11-13 NOTE — Telephone Encounter (Signed)
Patient has appointment with Laurance Flatten in September

## 2014-11-13 NOTE — Telephone Encounter (Signed)
Last seen 11/05/14 Dr Wendi Snipes  If approved route to nursing pool to be called into [place]   [phone]

## 2014-11-13 NOTE — Telephone Encounter (Signed)
Ok with refills. I would like to keep the xanax to 60 pills and no refills, we did not discuss this medicine and I would like to see him prior to his next refill to discuss long term plans.   Laroy Apple, MD [facility] 11/13/2014, 12:15 PM

## 2014-12-08 ENCOUNTER — Other Ambulatory Visit: Payer: Self-pay | Admitting: Family Medicine

## 2014-12-09 NOTE — Telephone Encounter (Signed)
Please call in xanax with 1 refills 

## 2014-12-09 NOTE — Telephone Encounter (Signed)
Last seen 10/28/14 MMM  If approved route to nurse to call into [facility]

## 2014-12-18 ENCOUNTER — Ambulatory Visit (INDEPENDENT_AMBULATORY_CARE_PROVIDER_SITE_OTHER): Payer: BLUE CROSS/BLUE SHIELD | Admitting: Family Medicine

## 2014-12-18 ENCOUNTER — Encounter: Payer: Self-pay | Admitting: Family Medicine

## 2014-12-18 VITALS — BP 128/88 | HR 64 | Temp 97.6°F | Ht 73.0 in | Wt 304.0 lb

## 2014-12-18 DIAGNOSIS — E559 Vitamin D deficiency, unspecified: Secondary | ICD-10-CM

## 2014-12-18 DIAGNOSIS — K219 Gastro-esophageal reflux disease without esophagitis: Secondary | ICD-10-CM

## 2014-12-18 DIAGNOSIS — J301 Allergic rhinitis due to pollen: Secondary | ICD-10-CM | POA: Diagnosis not present

## 2014-12-18 DIAGNOSIS — E8881 Metabolic syndrome: Secondary | ICD-10-CM

## 2014-12-18 DIAGNOSIS — E349 Endocrine disorder, unspecified: Secondary | ICD-10-CM

## 2014-12-18 DIAGNOSIS — M25562 Pain in left knee: Secondary | ICD-10-CM | POA: Diagnosis not present

## 2014-12-18 DIAGNOSIS — N400 Enlarged prostate without lower urinary tract symptoms: Secondary | ICD-10-CM

## 2014-12-18 DIAGNOSIS — I10 Essential (primary) hypertension: Secondary | ICD-10-CM | POA: Diagnosis not present

## 2014-12-18 DIAGNOSIS — E291 Testicular hypofunction: Secondary | ICD-10-CM | POA: Diagnosis not present

## 2014-12-18 NOTE — Patient Instructions (Addendum)
Continue current medications. Continue good therapeutic lifestyle changes which include good diet and exercise. Fall precautions discussed with patient. If an FOBT was given today- please return it to our front desk. If you are over 62 years old - you may need Prevnar 55 or the adult Pneumonia vaccine.  **Flu shots will be available soon--- please call and schedule a FLU-CLINIC appointment**  After your visit with [place] today you will receive a survey in the mail or online from [employer] regarding your care with [place]. Please take a moment to fill this out. Your feedback is very important to [place] as you can help [place] better understand your patient needs as well as improve your experience and satisfaction. WE CARE ABOUT YOU!!!   **Please join [place] SEPT.22, 2016 from 5:00 to 7:00pm for our OPEN HOUSE! Come out and meet our NEW providers** The patient should try some over-the-counter Aleve twice daily after breakfast and supper for 2-3 weeks. If this does not relieve his left knee pain he will call us and we will arrange for him to have an MRI of the left knee to further evaluate what is going on there. If the Aleve bothers his stomach, he should discontinue it. He should be careful and did not put himself at risk for re-injuring the knee and should watch where he is walking especially if coming down a ladder and climbing. He should call us in about 4 weeks if his level of anxiety is not lessening. He should continue to monitor his blood pressures at home and watch his sodium intake and bring some readings by for review and 3-4 weeks.

## 2014-12-18 NOTE — Progress Notes (Signed)
Subjective:    Patient ID: Arthur Foster, male    DOB: 09/02/1950, 64 y.o.   MRN: [id number]  HPI Pt here for follow up and management of chronic medical problems which includes hypertension and testosterone def. He is taking medications regularly. The patient is complaining of some left knee pain today. He has taken some anti-inflammatory medicine when this initially occurred and this helped some but he is not taking that anymore. He still has some problems with certain positions and movements in the medial aspect of the left knee with bending the knee. He is also had some increased anxiety episodes and he is currently taking some Xanax as needed for this and he is still on Lexapro. We will continue to watch this if these issues continue are worse and we may need to increase the Lexapro and we discussed this with him during the visit today. He denies chest pain or shortness of breath. He does have occasional heartburn and indigestion usually related to eating spicy food. He takes a Zantac for this. He has not seen any blood in the stool or had any black tarry bowel movements. He is passing his water okay. His erectile dysfunction is enhanced with using the Testim and taking a Cialis.      Patient Active Problem List   Diagnosis Date Noted  . Cough 11/05/2014  . Left knee pain 11/05/2014  . Allergic rhinitis 06/18/2013  . Severe obesity (BMI >= 40) 06/18/2013  . BPH (benign prostatic hypertrophy) 07/03/2012  . Essential hypertension, benign 07/03/2012  . Depression 07/03/2012  . GERD (gastroesophageal reflux disease) 07/03/2012  . Testosterone deficiency 07/03/2012   Outpatient Encounter Prescriptions as of 12/18/2014  Medication Sig  . ALPRAZolam (XANAX) 0.25 MG tablet TAKE 1 OR 2 TABLETS DAILY AS NEEDED  . aspirin 81 MG tablet Take 81 mg by mouth daily.  . cetirizine (ZYRTEC) 10 MG tablet Take 1 tablet (10 mg total) by mouth daily.  [name] escitalopram (LEXAPRO) 10 MG tablet TAKE 1 TABLET  EVERY DAY  . glucosamine-chondroitin 500-400 MG tablet Take 1 tablet by mouth 3 (three) times daily.  [name] lisinopril (PRINIVIL,ZESTRIL) 20 MG tablet TAKE 1 TABLET EVERY DAY  . Multiple Vitamin (MULTIVITAMIN) tablet Take 1 tablet by mouth daily.  [name] omega-3 acid ethyl esters (LOVAZA) 1 G capsule TAKE 2 CAPSULES TWICE A DAY  . testosterone (TESTIM) 50 MG/5GM (1%) GEL PLACE 5GRAMS ONTO THE SKIN DAILY  . diclofenac (VOLTAREN) 75 MG EC tablet Take 1 tablet (75 mg total) by mouth 2 (two) times daily. (Patient not taking: Reported on 12/18/2014)  . phentermine 37.5 MG capsule Take 1 capsule (37.5 mg total) by mouth every morning. (Patient not taking: Reported on 12/18/2014)  . [DISCONTINUED] benzonatate (TESSALON) 100 MG capsule Take 1 capsule (100 mg total) by mouth 2 (two) times daily as needed for cough.   No facility-administered encounter medications on file as of 12/18/2014.      Review of Systems  Constitutional: Negative.   HENT: Negative.   Eyes: Negative.   Respiratory: Negative.   Cardiovascular: Negative.   Gastrointestinal: Negative.   Endocrine: Negative.   Genitourinary: Negative.   Musculoskeletal: Positive for arthralgias (left knee pain ).  Skin: Negative.   Allergic/Immunologic: Negative.   Neurological: Negative.   Hematological: Negative.   Psychiatric/Behavioral: Negative.        Objective:   Physical Exam  Constitutional: He is oriented to person, place, and time. He appears well-developed and well-nourished. No distress.  HENT:  Head: Normocephalic and atraumatic.  Right Ear: External ear normal.  Left Ear: External ear normal.  Mouth/Throat: Oropharynx is clear and moist. No oropharyngeal exudate.  Nasal congestion and turbinate swelling bilaterally  Eyes: Conjunctivae and EOM are normal. Pupils are equal, round, and reactive to light. Right eye exhibits no discharge. Left eye exhibits no discharge. No scleral icterus.  Neck: Normal range of motion. Neck supple.  No thyromegaly present.  Neck without bruits thyromegaly or adenopathy  Cardiovascular: Normal rate, regular rhythm, normal heart sounds and intact distal pulses.   No murmur heard. At 72/m  Pulmonary/Chest: Effort normal and breath sounds normal. No respiratory distress. He has no wheezes. He has no rales. He exhibits no tenderness.  No axillary adenopathy Clear anteriorly and posteriorly  Abdominal: Soft. Bowel sounds are normal. He exhibits no mass. There is no tenderness. There is no rebound and no guarding.  Obese without masses epigastric tenderness or inguinal adenopathy  Genitourinary: Rectum normal and penis normal.  The prostate was enlarged but soft and smooth. No rectal masses. There are no inguinal hernias. External genitalia were within normal limits.  Musculoskeletal: Normal range of motion. He exhibits no edema or tenderness.  The patient has pretty good range of motion of both knees with minimal pain and no rubor. There is slight tenderness at the medial joint line of the left knee with palpation.  Lymphadenopathy:    He has no cervical adenopathy.  Neurological: He is alert and oriented to person, place, and time. He has normal reflexes. No cranial nerve deficit.  Skin: Skin is warm and dry. No rash noted.  Psychiatric: He has a normal mood and affect. His behavior is normal. Judgment and thought content normal.  Nursing note and vitals reviewed.   BP 150/89 mmHg  Pulse 64  Temp(Src) 97.6 F (36.4 C) (Oral)  Ht '6\' 1"'  (1.854 m)  Wt 304 lb (137.893 kg)  BMI 40.12 kg/m2  Repeat blood pressure right arm sitting with large cuff 128/88     Assessment & Plan:  1. Gastroesophageal reflux disease, esophagitis presence not specified -The patient does have occasional reflux especially after eating spicy food. He was told that if he goes out to eat andhe is going to eat something spicy should take a Zantac prior to that experience. - CBC with Differential/Platelet; Future -  Hepatic function panel; Future  2. BPH (benign prostatic hypertrophy) -The prostate remains enlarged but smooth. The patient is due to get blood work including a PSA soon and that he is still on exogenous testosterone. - POCT UA - Microscopic Only - POCT urinalysis dipstick - CBC with Differential/Platelet; Future - Testosterone,Free and Total; Future - PSA; Future  3. Essential hypertension, benign -Repeat blood pressure was good today and the patient will continue to monitor blood pressures at home and bring readings by for review and 3-4 weeks. He will watch his sodium intake. - BMP8+EGFR; Future - CBC with Differential/Platelet; Future - Hepatic function panel; Future - NMR, lipoprofile; Future  4. Vitamin D deficiency -Continue current dose of vitamin D pending results of lab work - CBC with Differential/Platelet; Future  5. Testosterone deficiency -Continue Testim or testosterone deficiency medication in conjunction with using Cialis as needed for erectile dysfunction - POCT UA - Microscopic Only - POCT urinalysis dipstick - CBC with Differential/Platelet; Future - Testosterone,Free and Total; Future - PSA; Future  6. Metabolic syndrome -Continue to work on weight loss by drinking more fluids and eating less. - BMP8+EGFR; Future -  CBC with Differential/Platelet; Future - NMR, lipoprofile; Future  7. Left knee pain -The patient will try Aleve twice daily for 2-3 weeks after breakfast and supper unless it irritates his stomach. If it helps his knee pain that is great if it continues to have problems with the knee he will call us and we will arrange to get an MRI.  8. Allergic rhinitis due to pollen -Use generic fluticasone regularly  No orders of the defined types were placed in this encounter.   Patient Instructions  Continue current medications. Continue good therapeutic lifestyle changes which include good diet and exercise. Fall precautions discussed with  patient. If an FOBT was given today- please return it to our front desk. If you are over 24 years old - you may need Prevnar 49 or the adult Pneumonia vaccine.  **Flu shots will be available soon--- please call and schedule a FLU-CLINIC appointment**  After your visit with [place] today you will receive a survey in the mail or online from [employer] regarding your care with [place]. Please take a moment to fill this out. Your feedback is very important to [place] as you can help [place] better understand your patient needs as well as improve your experience and satisfaction. WE CARE ABOUT YOU!!!   **Please join [place] SEPT.22, 2016 from 5:00 to 7:00pm for our OPEN HOUSE! Come out and meet our NEW providers** The patient should try some over-the-counter Aleve twice daily after breakfast and supper for 2-3 weeks. If this does not relieve his left knee pain he will call us and we will arrange for him to have an MRI of the left knee to further evaluate what is going on there. If the Aleve bothers his stomach, he should discontinue it. He should be careful and did not put himself at risk for re-injuring the knee and should watch where he is walking especially if coming down a ladder and climbing. He should call us in about 4 weeks if his level of anxiety is not lessening. He should continue to monitor his blood pressures at home and watch his sodium intake and bring some readings by for review and 3-4 weeks.   Arrie Senate MD

## 2014-12-19 ENCOUNTER — Other Ambulatory Visit (INDEPENDENT_AMBULATORY_CARE_PROVIDER_SITE_OTHER): Payer: BLUE CROSS/BLUE SHIELD

## 2014-12-19 DIAGNOSIS — E349 Endocrine disorder, unspecified: Secondary | ICD-10-CM

## 2014-12-19 DIAGNOSIS — E8881 Metabolic syndrome: Secondary | ICD-10-CM

## 2014-12-19 DIAGNOSIS — I10 Essential (primary) hypertension: Secondary | ICD-10-CM

## 2014-12-19 DIAGNOSIS — N400 Enlarged prostate without lower urinary tract symptoms: Secondary | ICD-10-CM

## 2014-12-19 DIAGNOSIS — K219 Gastro-esophageal reflux disease without esophagitis: Secondary | ICD-10-CM

## 2014-12-19 DIAGNOSIS — E559 Vitamin D deficiency, unspecified: Secondary | ICD-10-CM

## 2014-12-19 LAB — POCT URINALYSIS DIPSTICK
BILIRUBIN UA: NEGATIVE
Blood, UA: NEGATIVE
GLUCOSE UA: NEGATIVE
Ketones, UA: NEGATIVE
LEUKOCYTES UA: NEGATIVE
NITRITE UA: NEGATIVE
Protein, UA: NEGATIVE
Spec Grav, UA: >=1.03
UROBILINOGEN UA: NEGATIVE
pH, UA: 6

## 2014-12-19 LAB — POCT UA - MICROSCOPIC ONLY
BACTERIA, U MICROSCOPIC: NEGATIVE
CASTS, UR, LPF, POC: NEGATIVE
Crystals, Ur, HPF, POC: NEGATIVE
Epithelial cells, urine per micros: NEGATIVE
MUCUS UA: NEGATIVE
RBC, URINE, MICROSCOPIC: NEGATIVE
Yeast, UA: NEGATIVE

## 2014-12-21 ENCOUNTER — Other Ambulatory Visit: Payer: Self-pay | Admitting: *Deleted

## 2014-12-21 LAB — CBC WITH DIFFERENTIAL/PLATELET
BASOS: 1 %
Basophils Absolute: 0 10*3/uL (ref 0.0–0.2)
EOS (ABSOLUTE): 0.2 10*3/uL (ref 0.0–0.4)
Eos: 4 %
Hematocrit: 47.3 % (ref 37.5–51.0)
Hemoglobin: 15.5 g/dL (ref 12.6–17.7)
IMMATURE GRANS (ABS): 0 10*3/uL (ref 0.0–0.1)
Immature Granulocytes: 0 %
LYMPHS ABS: 1.9 10*3/uL (ref 0.7–3.1)
LYMPHS: 33 %
MCH: 30.2 pg (ref 26.6–33.0)
MCHC: 32.8 g/dL (ref 31.5–35.7)
MCV: 92 fL (ref 79–97)
Monocytes Absolute: 0.5 10*3/uL (ref 0.1–0.9)
Monocytes: 8 %
NEUTROS ABS: 3.1 10*3/uL (ref 1.4–7.0)
Neutrophils: 54 %
PLATELETS: 317 10*3/uL (ref 150–379)
RBC: 5.13 x10E6/uL (ref 4.14–5.80)
RDW: 13.5 % (ref 12.3–15.4)
WBC: 5.8 10*3/uL (ref 3.4–10.8)

## 2014-12-21 LAB — NMR, LIPOPROFILE
Cholesterol: 217 mg/dL — ABNORMAL HIGH (ref 100–199)
HDL Cholesterol by NMR: 48 mg/dL (ref >39)
HDL Particle Number: 27.8 umol/L — ABNORMAL LOW (ref >=30.5)
LDL PARTICLE NUMBER: 1876 nmol/L — AB (ref <1000)
LDL SIZE: 21.4 nm (ref >20.5)
LDL-C: 143 mg/dL — ABNORMAL HIGH (ref 0–99)
LP-IR SCORE: 59 — AB (ref <=45)
Small LDL Particle Number: 659 nmol/L — ABNORMAL HIGH (ref <=527)
Triglycerides by NMR: 129 mg/dL (ref 0–149)

## 2014-12-21 LAB — HEPATIC FUNCTION PANEL
ALK PHOS: 59 IU/L (ref 39–117)
ALT: 33 IU/L (ref 0–44)
AST: 25 IU/L (ref 0–40)
Albumin: 4.4 g/dL (ref 3.6–4.8)
BILIRUBIN, DIRECT: 0.27 mg/dL (ref 0.00–0.40)
Bilirubin Total: 1.3 mg/dL — ABNORMAL HIGH (ref 0.0–1.2)
Total Protein: 6.8 g/dL (ref 6.0–8.5)

## 2014-12-21 LAB — TESTOSTERONE,FREE AND TOTAL
TESTOSTERONE FREE: 3 pg/mL — AB (ref 6.6–18.1)
Testosterone: 42 ng/dL — ABNORMAL LOW (ref 348–1197)

## 2014-12-21 LAB — BMP8+EGFR
BUN / CREAT RATIO: 14 (ref 10–22)
BUN: 16 mg/dL (ref 8–27)
CALCIUM: 9.6 mg/dL (ref 8.6–10.2)
CO2: 22 mmol/L (ref 18–29)
Chloride: 102 mmol/L (ref 97–108)
Creatinine, Ser: 1.11 mg/dL (ref 0.76–1.27)
GFR, EST AFRICAN AMERICAN: 82 mL/min/{1.73_m2} (ref >59)
GFR, EST NON AFRICAN AMERICAN: 71 mL/min/{1.73_m2} (ref >59)
Glucose: 138 mg/dL — ABNORMAL HIGH (ref 65–99)
POTASSIUM: 4.8 mmol/L (ref 3.5–5.2)
SODIUM: 140 mmol/L (ref 134–144)

## 2014-12-21 LAB — PSA: Prostate Specific Ag, Serum: 0.9 ng/mL (ref 0.0–4.0)

## 2014-12-21 MED ORDER — TESTOSTERONE 20.25 MG/ACT (1.62%) TD GEL
TRANSDERMAL | Status: DC
Start: 2014-12-21 — End: 2015-03-31

## 2014-12-23 LAB — SPECIMEN STATUS REPORT

## 2014-12-23 LAB — HGB A1C W/O EAG: Hgb A1c MFr Bld: 5.6 % (ref 4.8–5.6)

## 2014-12-29 ENCOUNTER — Telehealth: Payer: Self-pay

## 2014-12-29 ENCOUNTER — Ambulatory Visit: Payer: 59 | Admitting: Family Medicine

## 2014-12-29 NOTE — Telephone Encounter (Signed)
Insurance prior authorized Androgel through 04/02/38

## 2015-01-05 ENCOUNTER — Telehealth: Payer: Self-pay | Admitting: Family Medicine

## 2015-01-05 MED ORDER — AMOXICILLIN-POT CLAVULANATE 875-125 MG PO TABS: 1.0000 | ORAL_TABLET | Freq: Two times a day (BID) | ORAL | Status: DC

## 2015-01-05 NOTE — Telephone Encounter (Signed)
Patient aware that Rx has been sent to the pharmacy 

## 2015-01-05 NOTE — Telephone Encounter (Signed)
Please call in Augmentin [phone] #[phone] one twice daily with food for infection until completed

## 2015-01-28 ENCOUNTER — Other Ambulatory Visit: Payer: Self-pay | Admitting: Family Medicine

## 2015-01-28 MED ORDER — ESCITALOPRAM OXALATE 20 MG PO TABS: 20.0000 mg | ORAL_TABLET | Freq: Every day | ORAL | Status: DC

## 2015-01-28 NOTE — Telephone Encounter (Signed)
Patient aware.

## 2015-01-28 NOTE — Telephone Encounter (Signed)
Dr Laurance Flatten and patient discussed increasing Lexapro for anxiety at his last visit. Lexapro 10mg  worked well for about 12 years but he has noticed his anxiety level increasing recently and would like to increase his dose to 20mg .  Advised that Dr Laurance Flatten is out of the office today but that I will forward this message to him for review and response will most likely be tomorrow.

## 2015-01-28 NOTE — Telephone Encounter (Signed)
It is okay to increase this medication to 20 mg. Please call in a new prescription for Lexapro 20 one daily refill 2

## 2015-02-08 ENCOUNTER — Other Ambulatory Visit: Payer: Self-pay | Admitting: Family Medicine

## 2015-02-08 NOTE — Telephone Encounter (Signed)
Last office visit 12-18-14. Rx last filled on 01-08-15 for #60. Please advise and route to Pool A so nurse can phone in to pharmacy

## 2015-03-17 ENCOUNTER — Encounter: Payer: Self-pay | Admitting: Pediatrics

## 2015-03-17 ENCOUNTER — Ambulatory Visit: Payer: BLUE CROSS/BLUE SHIELD

## 2015-03-17 ENCOUNTER — Ambulatory Visit (INDEPENDENT_AMBULATORY_CARE_PROVIDER_SITE_OTHER): Payer: BLUE CROSS/BLUE SHIELD | Admitting: Pediatrics

## 2015-03-17 VITALS — BP 146/93 | HR 68 | Temp 98.2°F | Ht 73.0 in | Wt 312.0 lb

## 2015-03-17 DIAGNOSIS — I10 Essential (primary) hypertension: Secondary | ICD-10-CM

## 2015-03-17 DIAGNOSIS — H938X2 Other specified disorders of left ear: Secondary | ICD-10-CM

## 2015-03-17 DIAGNOSIS — J019 Acute sinusitis, unspecified: Secondary | ICD-10-CM | POA: Diagnosis not present

## 2015-03-17 MED ORDER — AMOXICILLIN-POT CLAVULANATE 875-125 MG PO TABS: 1.0000 | ORAL_TABLET | Freq: Two times a day (BID) | ORAL | Status: DC

## 2015-03-17 NOTE — Patient Instructions (Addendum)
Twice a day netipot rinses use distilled water  Ibuprofen 600mg  three times a day  Check blood pressure at home, let me know if regularly elevated >140 on top or >90 on bottom

## 2015-03-17 NOTE — Progress Notes (Signed)
Subjective:    Patient ID: Arthur Foster, male    DOB: 03-08-1951, 64 y.o.   MRN: [id number]  CC: Cough and Nasal Congestion   HPI: Arthur Foster is a 64 y.o. male presenting for Cough and Nasal Congestion  Bad cough, keeps him up at night Lots of phelgm during the day Taking nyquil at night, not helping much Wakes him up in the middle of the night coughing Has allergies, has been taking zyrtec One day of subjective fever several days ago Ongoing for past week Felt better three days Normal appetite, no abdominal pain Decreased hearing L ear for several months   Depression screen [facility] 2/9 03/17/2015 12/18/2014 11/05/2014 06/25/2014  Decreased Interest 0 0 0 0  Down, Depressed, Hopeless 0 0 0 0  PHQ - 2 Score 0 0 0 0     Relevant past medical, surgical, family and social history reviewed and updated as indicated. Interim medical history since our last visit reviewed. Allergies and medications reviewed and updated.    ROS: Per HPI unless specifically indicated above  History  Smoking status  . Former Smoker  . Quit date: 12/19/2000  Smokeless tobacco  . Not on file    Past Medical History Patient Active Problem List   Diagnosis Date Noted  . Cough 11/05/2014  . Left knee pain 11/05/2014  . Allergic rhinitis 06/18/2013  . Severe obesity (BMI >= 40) ([place]) 06/18/2013  . BPH (benign prostatic hypertrophy) 07/03/2012  . Essential hypertension, benign 07/03/2012  . Depression 07/03/2012  . GERD (gastroesophageal reflux disease) 07/03/2012  . Testosterone deficiency 07/03/2012    Current Outpatient Prescriptions  Medication Sig Dispense Refill  . ALPRAZolam (XANAX) 0.25 MG tablet TAKE 1 OR 2 TABLETS DAILY AS NEEDED 60 tablet 1  . aspirin 81 MG tablet Take 81 mg by mouth daily.    . cetirizine (ZYRTEC) 10 MG tablet Take 1 tablet (10 mg total) by mouth daily. 30 tablet 11  . escitalopram (LEXAPRO) 20 MG tablet Take 1 tablet (20 mg total) by mouth daily. 90 tablet 1  .  lisinopril (PRINIVIL,ZESTRIL) 20 MG tablet TAKE 1 TABLET EVERY DAY 90 tablet 1  . Multiple Vitamin (MULTIVITAMIN) tablet Take 1 tablet by mouth daily.    . Testosterone (ANDROGEL PUMP) 20.25 MG/ACT (1.62%) GEL PLACE 4 PUMPS ONTO THE SKIN DAILY 150 g 2  . amoxicillin-clavulanate (AUGMENTIN) 875-125 MG tablet Take 1 tablet by mouth 2 (two) times daily. 20 tablet 0   No current facility-administered medications for this visit.       Objective:    BP 146/93 mmHg  Pulse 68  Temp(Src) 98.2 F (36.8 C) (Oral)  Ht 6\' 1"  (1.854 m)  Wt 312 lb (141.522 kg)  BMI 41.17 kg/m2  Wt Readings from Last 3 Encounters:  03/17/15 312 lb (141.522 kg)  12/18/14 304 lb (137.893 kg)  11/05/14 307 lb 12.8 oz (139.617 kg)     Gen: NAD, alert, cooperative with exam, NCAT EYES: EOMI, no scleral injection or icterus ENT:  TMs slightly erythema b/l, slightly splayed cone of light b/l. L TM with partially visualized flesh colored polyp vs mass at end fo ear canal, does not appear to disrupt TM but not able to fully visualize. OP with mild erythema, no tenderness over sinuses. Decreased hearing L side with whisper test. LYMPH: no cervical LAD CV: NRRR, normal S1/S2, no murmur, distal pulses 2+ b/l Resp: CTABL, no wheezes, normal WOB Abd: +BS, soft, NTND. no guarding or organomegaly Ext: No  edema, warm Neuro: Alert and oriented, strength equal b/l UE and LE, coordination grossly normal MSK: normal muscle bulk     Assessment & Plan:    [name] was seen today for acute sinusitis.  Diagnoses and all orders for this visit:  Acute sinusitis, recurrence not specified, unspecified location Discussed symptomatic care including netipot rinses twice a day. Start below. -     amoxicillin-clavulanate (AUGMENTIN) 875-125 MG tablet; Take 1 tablet by mouth 2 (two) times daily.  Mass of ear canal, left  -     Ambulatory referral to ENT  HTN continue lisinopril. Check at home. Let us know if regularly elevated >140 or  >90  Follow up plan: As needed  Assunta Found, MD [facility] 03/17/2015, 8:33 AM

## 2015-03-31 ENCOUNTER — Other Ambulatory Visit: Payer: Self-pay | Admitting: Family Medicine

## 2015-03-31 NOTE — Telephone Encounter (Signed)
Last seen 03/17/15  DWM  If approved route to nurse to call into [facility]

## 2015-04-13 ENCOUNTER — Telehealth: Payer: Self-pay | Admitting: Family Medicine

## 2015-04-13 ENCOUNTER — Other Ambulatory Visit: Payer: Self-pay

## 2015-04-13 DIAGNOSIS — N529 Male erectile dysfunction, unspecified: Secondary | ICD-10-CM

## 2015-04-13 NOTE — Telephone Encounter (Signed)
Please address

## 2015-04-14 ENCOUNTER — Other Ambulatory Visit: Payer: Self-pay | Admitting: *Deleted

## 2015-04-14 MED ORDER — TADALAFIL 20 MG PO TABS: 20.0000 mg | ORAL_TABLET | Freq: Every day | ORAL | Status: DC | PRN

## 2015-04-14 MED ORDER — SILDENAFIL CITRATE 20 MG PO TABS: 20.0000 mg | ORAL_TABLET | Freq: Three times a day (TID) | ORAL | Status: DC

## 2015-04-14 MED ORDER — SILDENAFIL CITRATE 20 MG PO TABS: ORAL_TABLET | ORAL | Status: DC

## 2015-04-14 NOTE — Telephone Encounter (Signed)
I wrote an Rx for 30 tabs with 2 refills. He can come by to pick up.

## 2015-04-14 NOTE — Telephone Encounter (Signed)
Pt aware rx is ready for pickup.  

## 2015-04-27 ENCOUNTER — Telehealth: Payer: Self-pay

## 2015-04-27 NOTE — Telephone Encounter (Signed)
Insurance denied Sildenafil  This medication used to treat Pulmonary HTN

## 2015-04-28 MED ORDER — SILDENAFIL CITRATE 20 MG PO TABS: ORAL_TABLET | ORAL | Status: DC

## 2015-04-28 NOTE — Telephone Encounter (Signed)
Printed out paper Rx with coupon for sildenafil.

## 2015-05-10 ENCOUNTER — Other Ambulatory Visit: Payer: Self-pay | Admitting: Family Medicine

## 2015-06-28 ENCOUNTER — Other Ambulatory Visit: Payer: Self-pay | Admitting: Family Medicine

## 2015-06-28 NOTE — Telephone Encounter (Signed)
Last seen 03/17/15 Dr Evette Doffing  Dr Laurance Flatten PCP   If approved route to nurse to call into [facility]

## 2015-07-02 ENCOUNTER — Other Ambulatory Visit: Payer: Self-pay | Admitting: Family Medicine

## 2015-07-02 NOTE — Telephone Encounter (Signed)
Last filled 06/03/15. last seen by Laurance Flatten 12/18/14.

## 2015-07-02 NOTE — Telephone Encounter (Signed)
rx called into pharmacy

## 2015-07-16 ENCOUNTER — Other Ambulatory Visit: Payer: Self-pay | Admitting: Family Medicine

## 2015-07-19 NOTE — Telephone Encounter (Signed)
Last seen 03/17/15  Dr Evette Doffing

## 2015-08-11 ENCOUNTER — Other Ambulatory Visit: Payer: Self-pay | Admitting: Family Medicine

## 2015-08-23 ENCOUNTER — Other Ambulatory Visit: Payer: Self-pay | Admitting: Family Medicine

## 2015-08-23 NOTE — Telephone Encounter (Signed)
Last seen 03/17/15  Dr Evette Doffing   Dr Laurance Flatten PCP

## 2015-09-03 ENCOUNTER — Ambulatory Visit (INDEPENDENT_AMBULATORY_CARE_PROVIDER_SITE_OTHER): Payer: BLUE CROSS/BLUE SHIELD | Admitting: Family

## 2015-09-03 ENCOUNTER — Encounter: Payer: Self-pay | Admitting: Family

## 2015-09-03 VITALS — BP 151/84 | HR 76 | Temp 97.5°F | Ht 73.0 in | Wt 316.0 lb

## 2015-09-03 DIAGNOSIS — N390 Urinary tract infection, site not specified: Secondary | ICD-10-CM

## 2015-09-03 DIAGNOSIS — R300 Dysuria: Secondary | ICD-10-CM

## 2015-09-03 LAB — URINALYSIS, COMPLETE
Bilirubin, UA: NEGATIVE
GLUCOSE, UA: NEGATIVE
KETONES UA: NEGATIVE
NITRITE UA: NEGATIVE
Protein, UA: NEGATIVE
RBC, UA: NEGATIVE
UUROB: 0.2 mg/dL (ref 0.2–1.0)
pH, UA: 5.5 (ref 5.0–7.5)

## 2015-09-03 LAB — MICROSCOPIC EXAMINATION
Bacteria, UA: NONE SEEN
RBC MICROSCOPIC, UA: NONE SEEN /HPF (ref 0–?)

## 2015-09-03 MED ORDER — CIPROFLOXACIN HCL 500 MG PO TABS: 500.0000 mg | ORAL_TABLET | Freq: Two times a day (BID) | ORAL | Status: DC

## 2015-09-03 NOTE — Progress Notes (Signed)
   Subjective:    Patient ID: Arthur Foster, male    DOB: 07/03/1950, 65 y.o.   MRN: [id number]  Back Pain This is a new problem. The current episode started 1 to 4 weeks ago. The problem is unchanged. The pain is present in the lumbar spine. The quality of the pain is described as aching and shooting. The pain is at a severity of 9/10. The pain is moderate. The symptoms are aggravated by bending. Associated symptoms include abdominal pain. Pertinent negatives include no bladder incontinence, bowel incontinence, dysuria, leg pain, numbness or tingling. (Urine frequency  ) Risk factors include obesity. He has tried NSAIDs for the symptoms. The treatment provided mild relief.  Abdominal Pain Pertinent negatives include no dysuria.      Review of Systems  Gastrointestinal: Positive for abdominal pain. Negative for bowel incontinence.  Genitourinary: Negative for bladder incontinence and dysuria.  Musculoskeletal: Positive for back pain.  Neurological: Negative for tingling and numbness.  All other systems reviewed and are negative.      Objective:   Physical Exam  Constitutional: He is oriented to person, place, and time. He appears well-developed and well-nourished. No distress.  HENT:  Head: Normocephalic.  Cardiovascular: Normal rate, regular rhythm, normal heart sounds and intact distal pulses.   No murmur heard. Pulmonary/Chest: Effort normal and breath sounds normal. No respiratory distress. He has no wheezes.  Abdominal: Soft. Bowel sounds are normal. He exhibits no distension. There is no tenderness.  Musculoskeletal: Normal range of motion. He exhibits no edema or tenderness.  Mild CVA tenderness   Neurological: He is alert and oriented to person, place, and time. He has normal reflexes. No cranial nerve deficit.  Skin: Skin is warm and dry. No rash noted. No erythema.  Psychiatric: He has a normal mood and affect. His behavior is normal. Judgment and thought content normal.    Vitals reviewed.    BP 151/84 mmHg  Pulse 76  Temp(Src) 97.5 F (36.4 C) (Oral)  Ht 6\' 1"  (1.854 m)  Wt 316 lb (143.337 kg)  BMI 41.70 kg/m2      Assessment & Plan:  1. Dysuria - Urinalysis, Complete  2. Urinary tract infection without hematuria, site unspecified -Force fluids AZO over the counter X2 days RTO prn Culture pending - ciprofloxacin (CIPRO) 500 MG tablet; Take 1 tablet (500 mg total) by mouth 2 (two) times daily.  Dispense: 20 tablet; Refill: 0 - Urine culture  Evelina Dun, FNP

## 2015-09-03 NOTE — Patient Instructions (Signed)

## 2015-09-14 ENCOUNTER — Telehealth: Payer: Self-pay | Admitting: Family

## 2015-09-14 MED ORDER — CYCLOBENZAPRINE HCL 10 MG PO TABS: 10.0000 mg | ORAL_TABLET | Freq: Three times a day (TID) | ORAL | Status: DC | PRN

## 2015-09-14 MED ORDER — NAPROXEN 500 MG PO TABS: 500.0000 mg | ORAL_TABLET | Freq: Two times a day (BID) | ORAL | Status: DC

## 2015-09-14 NOTE — Telephone Encounter (Signed)
Patient states that he is still having pain in the kidney area.  He is having a little more urine frequnation.

## 2015-09-14 NOTE — Telephone Encounter (Signed)
Discussed with patient. Sent in naprosyn and flexeril rx for possible MSK problem

## 2015-10-13 LAB — URINE CULTURE

## 2015-10-21 ENCOUNTER — Other Ambulatory Visit: Payer: Self-pay | Admitting: Nurse Practitioner

## 2015-10-21 ENCOUNTER — Other Ambulatory Visit: Payer: Self-pay | Admitting: Family Medicine

## 2015-12-01 ENCOUNTER — Other Ambulatory Visit: Payer: Self-pay | Admitting: Family Medicine

## 2015-12-01 DIAGNOSIS — R059 Cough, unspecified: Secondary | ICD-10-CM

## 2015-12-01 DIAGNOSIS — R05 Cough: Secondary | ICD-10-CM

## 2016-01-12 ENCOUNTER — Other Ambulatory Visit: Payer: Self-pay | Admitting: Family Medicine

## 2016-01-19 ENCOUNTER — Telehealth: Payer: Self-pay | Admitting: Family Medicine

## 2016-01-19 NOTE — Telephone Encounter (Signed)
Aware.  Flonase refilled.

## 2016-01-27 ENCOUNTER — Other Ambulatory Visit: Payer: Self-pay | Admitting: Family Medicine

## 2016-01-27 DIAGNOSIS — R05 Cough: Secondary | ICD-10-CM

## 2016-01-27 DIAGNOSIS — R059 Cough, unspecified: Secondary | ICD-10-CM

## 2016-02-14 ENCOUNTER — Other Ambulatory Visit: Payer: Self-pay | Admitting: Family Medicine

## 2016-02-29 ENCOUNTER — Other Ambulatory Visit: Payer: Self-pay | Admitting: Family Medicine

## 2016-03-01 NOTE — Telephone Encounter (Signed)
Patient NTBS for follow up and lab work  

## 2016-03-01 NOTE — Telephone Encounter (Signed)
Left detailed message that patient would have to be seen for any refills.

## 2016-03-06 ENCOUNTER — Ambulatory Visit (INDEPENDENT_AMBULATORY_CARE_PROVIDER_SITE_OTHER): Payer: BLUE CROSS/BLUE SHIELD | Admitting: Physician Assistant

## 2016-03-06 ENCOUNTER — Encounter: Payer: Self-pay | Admitting: Physician Assistant

## 2016-03-06 DIAGNOSIS — Z23 Encounter for immunization: Secondary | ICD-10-CM | POA: Diagnosis not present

## 2016-03-06 DIAGNOSIS — F411 Generalized anxiety disorder: Secondary | ICD-10-CM | POA: Diagnosis not present

## 2016-03-06 DIAGNOSIS — M542 Cervicalgia: Secondary | ICD-10-CM

## 2016-03-06 DIAGNOSIS — N400 Enlarged prostate without lower urinary tract symptoms: Secondary | ICD-10-CM

## 2016-03-06 DIAGNOSIS — K645 Perianal venous thrombosis: Secondary | ICD-10-CM | POA: Insufficient documentation

## 2016-03-06 DIAGNOSIS — I10 Essential (primary) hypertension: Secondary | ICD-10-CM

## 2016-03-06 DIAGNOSIS — M9901 Segmental and somatic dysfunction of cervical region: Secondary | ICD-10-CM | POA: Insufficient documentation

## 2016-03-06 DIAGNOSIS — E349 Endocrine disorder, unspecified: Secondary | ICD-10-CM

## 2016-03-06 DIAGNOSIS — E7800 Pure hypercholesterolemia, unspecified: Secondary | ICD-10-CM

## 2016-03-06 MED ORDER — CETIRIZINE HCL 10 MG PO TABS: 10.0000 mg | ORAL_TABLET | Freq: Every day | ORAL | 3 refills | Status: DC

## 2016-03-06 MED ORDER — LISINOPRIL 20 MG PO TABS: 20.0000 mg | ORAL_TABLET | Freq: Every day | ORAL | 3 refills | Status: DC

## 2016-03-06 MED ORDER — MOMETASONE FUROATE 0.1 % EX CREA: TOPICAL_CREAM | CUTANEOUS | 1 refills | Status: DC

## 2016-03-06 MED ORDER — HYDROCORTISONE ACETATE 25 MG RE SUPP: 25.0000 mg | Freq: Two times a day (BID) | RECTAL | 0 refills | Status: DC

## 2016-03-06 MED ORDER — ESCITALOPRAM OXALATE 20 MG PO TABS: 20.0000 mg | ORAL_TABLET | Freq: Every day | ORAL | 3 refills | Status: DC

## 2016-03-06 MED ORDER — TESTOSTERONE 20.25 MG/ACT (1.62%) TD GEL: TRANSDERMAL | 5 refills | Status: DC

## 2016-03-06 MED ORDER — SILDENAFIL CITRATE 20 MG PO TABS: ORAL_TABLET | ORAL | 6 refills | Status: DC

## 2016-03-06 MED ORDER — ALPRAZOLAM 0.25 MG PO TABS: 0.2500 mg | ORAL_TABLET | Freq: Two times a day (BID) | ORAL | 5 refills | Status: AC | PRN

## 2016-03-06 NOTE — Patient Instructions (Signed)
Cervical Strain and Sprain Rehab Ask your health care provider which exercises are safe for you. Do exercises exactly as told by your health care provider and adjust them as directed. It is normal to feel mild stretching, pulling, tightness, or discomfort as you do these exercises, but you should stop right away if you feel sudden pain or your pain gets worse.Do not begin these exercises until told by your health care provider. Stretching and range of motion exercises These exercises warm up your muscles and joints and improve the movement and flexibility of your neck. These exercises also help to relieve pain, numbness, and tingling. Exercise A: Cervical side bend 1. Using good posture, sit on a stable chair or stand up. 2. Without moving your shoulders, slowly tilt your left / right ear to your shoulder until you feel a stretch in your neck muscles. You should be looking straight ahead. 3. Hold for __________ seconds. 4. Repeat with the other side of your neck. Repeat __________ times. Complete this exercise __________ times a day. Exercise B: Cervical rotation 1. Using good posture, sit on a stable chair or stand up. 2. Slowly turn your head to the side as if you are looking over your left / right shoulder.  Keep your eyes level with the ground.  Stop when you feel a stretch along the side and the back of your neck. 3. Hold for __________ seconds. 4. Repeat this by turning to your other side. Repeat __________ times. Complete this exercise __________ times a day. Exercise C: Thoracic extension and pectoral stretch 1. Roll a towel or a small blanket so it is about 4 inches (10 cm) in diameter. 2. Lie down on your back on a firm surface. 3. Put the towel lengthwise, under your spine in the middle of your back. It should not be not under your shoulder blades. The towel should line up with your spine from your middle back to your lower back. 4. Put your hands behind your head and let your  elbows fall out to your sides. 5. Hold for __________ seconds. Repeat __________ times. Complete this exercise __________ times a day. Strengthening exercises These exercises build strength and endurance in your neck. Endurance is the ability to use your muscles for a long time, even after your muscles get tired. Exercise D: Upper cervical flexion, isometric 1. Lie on your back with a thin pillow behind your head and a small rolled-up towel under your neck. 2. Gently tuck your chin toward your chest and nod your head down to look toward your feet. Do not lift your head off the pillow. 3. Hold for __________ seconds. 4. Release the tension slowly. Relax your neck muscles completely before you repeat this exercise. Repeat __________ times. Complete this exercise __________ times a day. Exercise E: Cervical extension, isometric 1. Stand about 6 inches (15 cm) away from a wall, with your back facing the wall. 2. Place a soft object, about 6-8 inches (15-20 cm) in diameter, between the back of your head and the wall. A soft object could be a small pillow, a ball, or a folded towel. 3. Gently tilt your head back and press into the soft object. Keep your jaw and forehead relaxed. 4. Hold for __________ seconds. 5. Release the tension slowly. Relax your neck muscles completely before you repeat this exercise. Repeat __________ times. Complete this exercise __________ times a day. Posture and body mechanics   Body mechanics refers to the movements and positions of your body   while you do your daily activities. Posture is part of body mechanics. Good posture and healthy body mechanics can help to relieve stress in your body's tissues and joints. Good posture means that your spine is in its natural S-curve position (your spine is neutral), your shoulders are pulled back slightly, and your head is not tipped forward. The following are general guidelines for applying improved posture and body mechanics to your  everyday activities. Standing  When standing, keep your spine neutral and keep your feet about hip-width apart. Keep a slight bend in your knees. Your ears, shoulders, and hips should line up.  When you do a task in which you stand in one place for a long time, place one foot up on a stable object that is 2-4 inches (5-10 cm) high, such as a footstool. This helps keep your spine neutral. Sitting  When sitting, keep your spine neutral and your keep feet flat on the floor. Use a footrest, if necessary, and keep your thighs parallel to the floor. Avoid rounding your shoulders, and avoid tilting your head forward.  When working at a desk or a computer, keep your desk at a height where your hands are slightly lower than your elbows. Slide your chair under your desk so you are close enough to maintain good posture.  When working at a computer, place your monitor at a height where you are looking straight ahead and you do not have to tilt your head forward or downward to look at the screen. Resting When lying down and resting, avoid positions that are most painful for you. Try to support your neck in a neutral position. You can use a contour pillow or a small rolled-up towel. Your pillow should support your neck but not push on it. This information is not intended to replace advice given to you by your health care provider. Make sure you discuss any questions you have with your health care provider. Document Released: 03/20/2005 Document Revised: 11/25/2015 Document Reviewed: 02/24/2015 Elsevier Interactive Patient Education  2017 [employer]  

## 2016-03-06 NOTE — Progress Notes (Signed)
BP (!) 142/82 (BP Location: Right Arm, Patient Position: Sitting, Cuff Size: Large)   Pulse 69   Temp 97.3 F (36.3 C) (Oral)   Ht 6\' 1"  (1.854 m)   Wt (!) 312 lb (141.5 kg)   BMI 41.16 kg/m    Subjective:    Patient ID: Arthur Foster, male    DOB: 05-02-1950, 65 y.o.   MRN: [id number]  HPI: Arthur Foster is a 65 y.o. male presenting on 03/06/2016 for Anxiety (refill xanax) and Neck Pain (since August. [place] It improved after taking ibuprofen regularly but returned last when when doing some overhead work. Does have some pain into his right scapula with neck rotation. )  This patient comes in for periodic recheck on medications and conditions. All medications are reviewed today. There are no reports of any problems with the medications. All of the medical conditions are reviewed and updated.  Lab work is reviewed and will be ordered as medically necessary.   Cervical strain that has flared up this past week after he had been well. Can tolerate ibuprofen well and will work on stretching and ROM exercises.  Past Medical History:  Diagnosis Date  . Anxiety   . BPH (benign prostatic hyperplasia)   . Depression   . Hypertension   . Testosterone deficiency    Relevant past medical, surgical, family and social history reviewed and updated as indicated. Interim medical history since our last visit reviewed. Allergies and medications reviewed and updated. DATA REVIEWED: CHART IN EPIC  Social History   Social History  . Marital status: Married    Spouse name: N/A  . Number of children: N/A  . Years of education: N/A   Occupational History  . Not on file.   Social History Main Topics  . Smoking status: Former Smoker    Quit date: 12/19/2000  . Smokeless tobacco: Never Used  . Alcohol use Yes  . Drug use: No  . Sexual activity: Not on file   Other Topics Concern  . Not on file   Social History Narrative  . No narrative on file    Past Surgical  History:  Procedure Laterality Date  . HERNIA REPAIR Left inguinal    Family History  Problem Relation Age of Onset  . Hypertension Mother   . Alzheimer's disease Mother   . Hypertension Father   . Alzheimer's disease Father     Review of Systems  Constitutional: Negative.  Negative for appetite change and fatigue.  HENT: Negative.   Eyes: Negative.  Negative for pain and visual disturbance.  Respiratory: Negative.  Negative for cough, chest tightness, shortness of breath and wheezing.   Cardiovascular: Negative.  Negative for chest pain, palpitations and leg swelling.  Gastrointestinal: Positive for blood in stool and rectal pain. Negative for abdominal pain, diarrhea, nausea and vomiting.  Endocrine: Negative.   Genitourinary: Negative.   Musculoskeletal: Positive for arthralgias, neck pain and neck stiffness.  Skin: Negative.  Negative for color change and rash.  Neurological: Negative.  Negative for weakness, numbness and headaches.  Psychiatric/Behavioral: Negative.       Medication List       Accurate as of 03/06/16  9:15 AM. Always use your most recent med list.          ALPRAZolam 0.25 MG tablet Commonly known as:  XANAX Take 1 tablet (0.25 mg total) by mouth 2 (two) times daily as needed for anxiety.   aspirin 81 MG tablet  Take 81 mg by mouth daily.   cetirizine 10 MG tablet Commonly known as:  ZYRTEC Take 1 tablet (10 mg total) by mouth daily.   escitalopram 20 MG tablet Commonly known as:  LEXAPRO Take 1 tablet (20 mg total) by mouth daily.   fluticasone 50 MCG/ACT nasal spray Commonly known as:  FLONASE 1 TO 2 SPRAYS IN EACH NOSTRIL ONCE A DAY   hydrocortisone 25 MG suppository Commonly known as:  ANUSOL-HC Place 1 suppository (25 mg total) rectally 2 (two) times daily.   lisinopril 20 MG tablet Commonly known as:  PRINIVIL,ZESTRIL Take 1 tablet (20 mg total) by mouth daily.   mometasone 0.1 % cream Commonly known as:  ELOCON APPLY  SPARINGLY TWICE A DAY FOR 7 TO 10 DAYS   multivitamin tablet Take 1 tablet by mouth daily.   sildenafil 20 MG tablet Commonly known as:  REVATIO Take 2-5 tablets as needed for sexual activity   Testosterone 20.25 MG/ACT (1.62%) Gel Commonly known as:  ANDROGEL PUMP PLACE 4 PUMPS ONTO SKIN DAILY          Objective:    BP (!) 142/82 (BP Location: Right Arm, Patient Position: Sitting, Cuff Size: Large)   Pulse 69   Temp 97.3 F (36.3 C) (Oral)   Ht 6\' 1"  (1.854 m)   Wt (!) 312 lb (141.5 kg)   BMI 41.16 kg/m   Allergies  Allergen Reactions  . Ambien [Zolpidem Tartrate]     Wt Readings from Last 3 Encounters:  03/06/16 (!) 312 lb (141.5 kg)  09/03/15 (!) 316 lb (143.3 kg)  03/17/15 (!) 312 lb (141.5 kg)    Physical Exam  Constitutional: He appears well-developed and well-nourished.  HENT:  Head: Normocephalic and atraumatic.  Eyes: Conjunctivae and EOM are normal. Pupils are equal, round, and reactive to light.  Neck: Normal range of motion. Neck supple.  Cardiovascular: Normal rate, regular rhythm and normal heart sounds.   Pulmonary/Chest: Effort normal and breath sounds normal.  Abdominal: Soft. Bowel sounds are normal.  Musculoskeletal: Normal range of motion.  Skin: Skin is warm and dry.  Nursing note and vitals reviewed.       Assessment & Plan:   1. Generalized anxiety disorder - escitalopram (LEXAPRO) 20 MG tablet; Take 1 tablet (20 mg total) by mouth daily.  Dispense: 90 tablet; Refill: 3  2. Essential hypertension, benign - lisinopril (PRINIVIL,ZESTRIL) 20 MG tablet; Take 1 tablet (20 mg total) by mouth daily.  Dispense: 90 tablet; Refill: 3  3. Cervical strain Ibuprofen and exercises  Continue all other maintenance medications as listed above.  Follow up plan: Return in about 6 months (around 09/04/2016) for recheck.  Orders Placed This Encounter  Procedures  . Flu Vaccine QUAD 36+ mos IM    Educational handout given for cervical  strain  Terald Sleeper PA-C [facility] [address]  [place], [place] [address] [phone]   03/06/2016, 9:15 AM

## 2016-03-07 ENCOUNTER — Other Ambulatory Visit: Payer: BLUE CROSS/BLUE SHIELD

## 2016-03-07 DIAGNOSIS — N400 Enlarged prostate without lower urinary tract symptoms: Secondary | ICD-10-CM

## 2016-03-07 DIAGNOSIS — E7800 Pure hypercholesterolemia, unspecified: Secondary | ICD-10-CM

## 2016-03-07 DIAGNOSIS — I10 Essential (primary) hypertension: Secondary | ICD-10-CM

## 2016-03-07 DIAGNOSIS — E349 Endocrine disorder, unspecified: Secondary | ICD-10-CM

## 2016-03-08 LAB — TSH: TSH: 2.27 u[IU]/mL (ref 0.450–4.500)

## 2016-03-08 LAB — CMP14+EGFR
A/G RATIO: 1.8 (ref 1.2–2.2)
ALT: 29 IU/L (ref 0–44)
AST: 20 IU/L (ref 0–40)
Albumin: 4.4 g/dL (ref 3.6–4.8)
Alkaline Phosphatase: 59 IU/L (ref 39–117)
BUN/Creatinine Ratio: 18 (ref 10–24)
BUN: 18 mg/dL (ref 8–27)
Bilirubin Total: 0.8 mg/dL (ref 0.0–1.2)
CALCIUM: 9.2 mg/dL (ref 8.6–10.2)
CO2: 24 mmol/L (ref 18–29)
CREATININE: 1 mg/dL (ref 0.76–1.27)
Chloride: 103 mmol/L (ref 96–106)
GFR, EST AFRICAN AMERICAN: 92 mL/min/{1.73_m2} (ref >59)
GFR, EST NON AFRICAN AMERICAN: 80 mL/min/{1.73_m2} (ref >59)
Globulin, Total: 2.5 g/dL (ref 1.5–4.5)
Glucose: 104 mg/dL — ABNORMAL HIGH (ref 65–99)
POTASSIUM: 4.8 mmol/L (ref 3.5–5.2)
Sodium: 142 mmol/L (ref 134–144)
TOTAL PROTEIN: 6.9 g/dL (ref 6.0–8.5)

## 2016-03-08 LAB — CBC WITH DIFFERENTIAL/PLATELET
BASOS: 1 %
Basophils Absolute: 0 10*3/uL (ref 0.0–0.2)
EOS (ABSOLUTE): 0.4 10*3/uL (ref 0.0–0.4)
EOS: 6 %
HEMATOCRIT: 47.3 % (ref 37.5–51.0)
Hemoglobin: 15.8 g/dL (ref 13.0–17.7)
IMMATURE GRANS (ABS): 0 10*3/uL (ref 0.0–0.1)
IMMATURE GRANULOCYTES: 1 %
LYMPHS: 25 %
Lymphocytes Absolute: 1.6 10*3/uL (ref 0.7–3.1)
MCH: 30.2 pg (ref 26.6–33.0)
MCHC: 33.4 g/dL (ref 31.5–35.7)
MCV: 90 fL (ref 79–97)
Monocytes Absolute: 0.4 10*3/uL (ref 0.1–0.9)
Monocytes: 7 %
NEUTROS PCT: 60 %
Neutrophils Absolute: 3.8 10*3/uL (ref 1.4–7.0)
PLATELETS: 318 10*3/uL (ref 150–379)
RBC: 5.23 x10E6/uL (ref 4.14–5.80)
RDW: 13.1 % (ref 12.3–15.4)
WBC: 6.2 10*3/uL (ref 3.4–10.8)

## 2016-03-08 LAB — LIPID PANEL
CHOL/HDL RATIO: 4.5 ratio (ref 0.0–5.0)
Cholesterol, Total: 186 mg/dL (ref 100–199)
HDL: 41 mg/dL (ref >39)
LDL CALC: 118 mg/dL — AB (ref 0–99)
TRIGLYCERIDES: 134 mg/dL (ref 0–149)
VLDL CHOLESTEROL CAL: 27 mg/dL (ref 5–40)

## 2016-03-08 LAB — PSA: Prostate Specific Ag, Serum: 1 ng/mL (ref 0.0–4.0)

## 2016-03-08 LAB — TESTOSTERONE,FREE AND TOTAL
Testosterone, Free: 8.1 pg/mL (ref 6.6–18.1)
Testosterone: 187 ng/dL — ABNORMAL LOW (ref 264–916)

## 2016-03-08 NOTE — Progress Notes (Signed)
Patient aware.

## 2016-03-17 ENCOUNTER — Other Ambulatory Visit: Payer: Self-pay | Admitting: *Deleted

## 2016-03-17 DIAGNOSIS — M25562 Pain in left knee: Secondary | ICD-10-CM

## 2016-03-30 ENCOUNTER — Other Ambulatory Visit: Payer: Self-pay | Admitting: Family

## 2016-05-02 ENCOUNTER — Other Ambulatory Visit: Payer: Self-pay | Admitting: Family Medicine

## 2016-05-18 ENCOUNTER — Other Ambulatory Visit: Payer: Self-pay | Admitting: Orthopedic Surgery

## 2016-05-18 ENCOUNTER — Ambulatory Visit (INDEPENDENT_AMBULATORY_CARE_PROVIDER_SITE_OTHER): Payer: BLUE CROSS/BLUE SHIELD

## 2016-05-18 ENCOUNTER — Other Ambulatory Visit (INDEPENDENT_AMBULATORY_CARE_PROVIDER_SITE_OTHER): Payer: BLUE CROSS/BLUE SHIELD

## 2016-05-18 DIAGNOSIS — M25562 Pain in left knee: Secondary | ICD-10-CM

## 2016-07-07 ENCOUNTER — Other Ambulatory Visit: Payer: Self-pay | Admitting: Family Medicine

## 2016-08-30 ENCOUNTER — Other Ambulatory Visit: Payer: Self-pay | Admitting: Family Medicine

## 2016-09-13 IMAGING — CR DG CHEST 2V
2 series · 2 of 2 positions shown · non-contrast
Comparison: None.

CLINICAL DATA: Essential hypertension.  Annual physical examination

EXAM:
CHEST  2 VIEW

[view not recorded (1 of 2)]
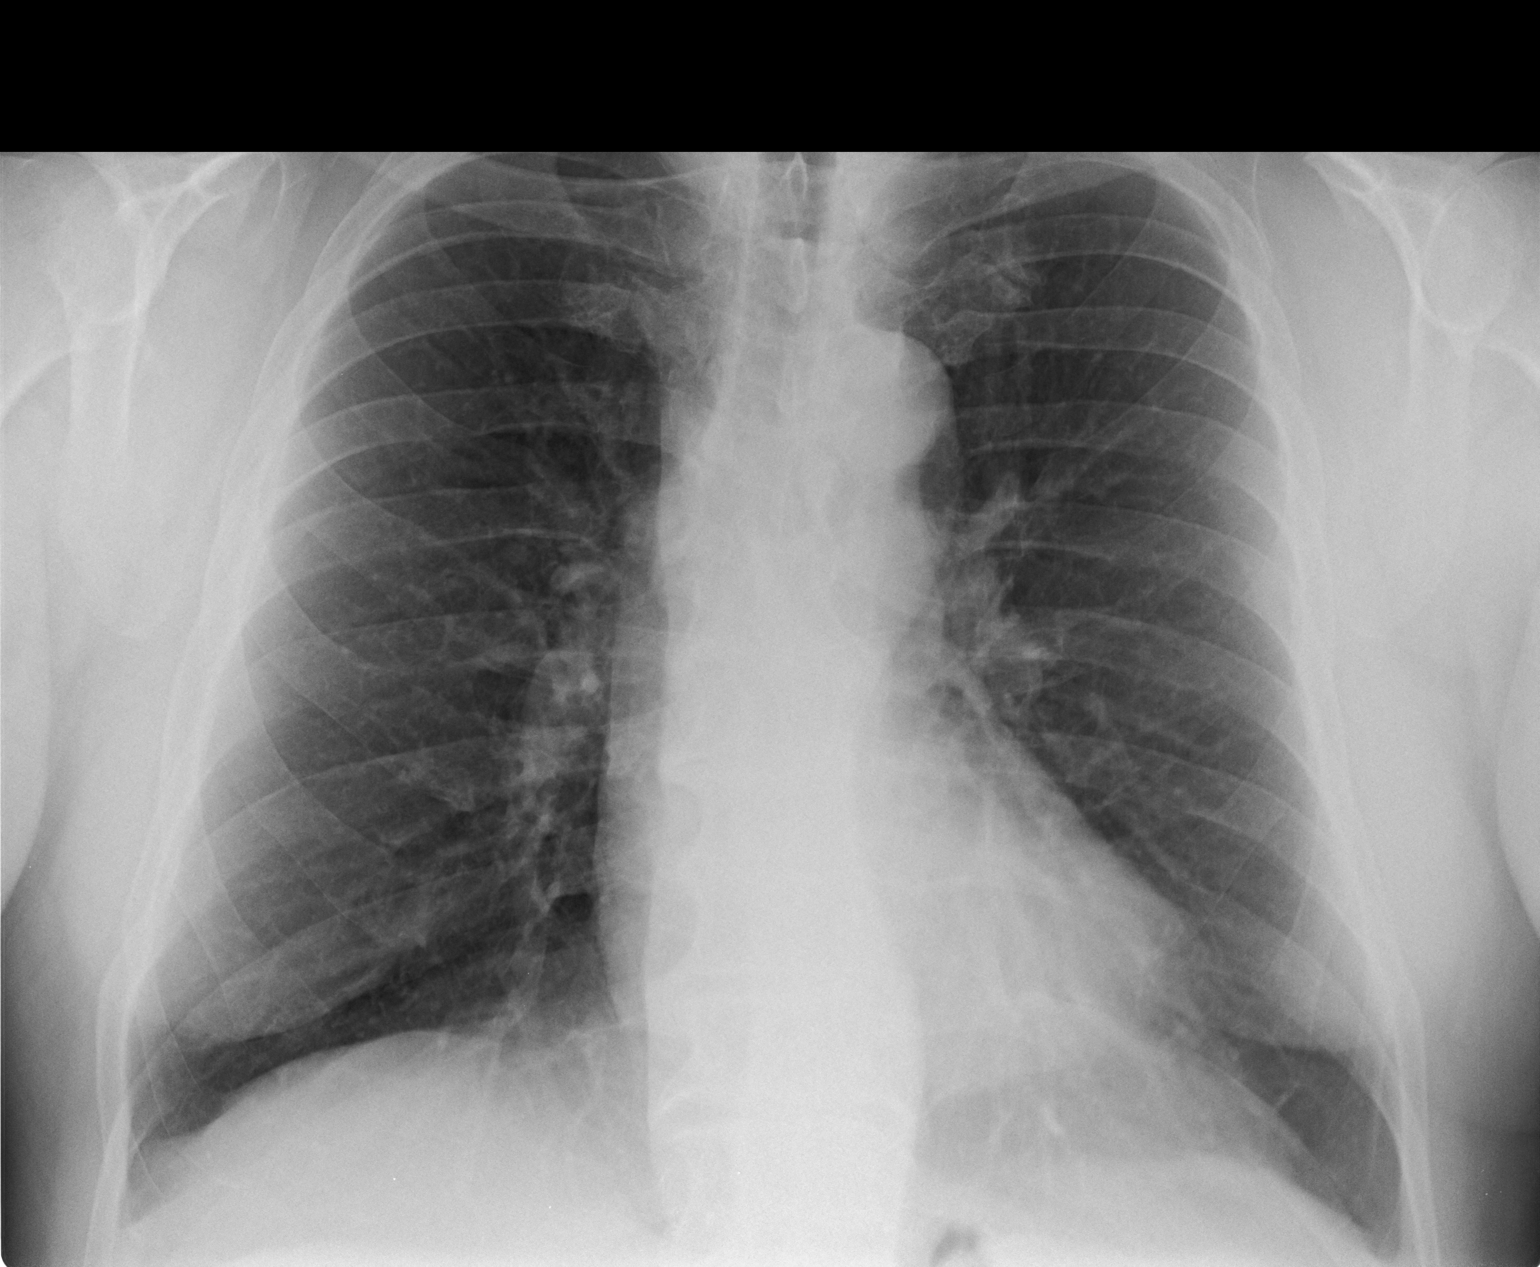

[view not recorded (2 of 2)]
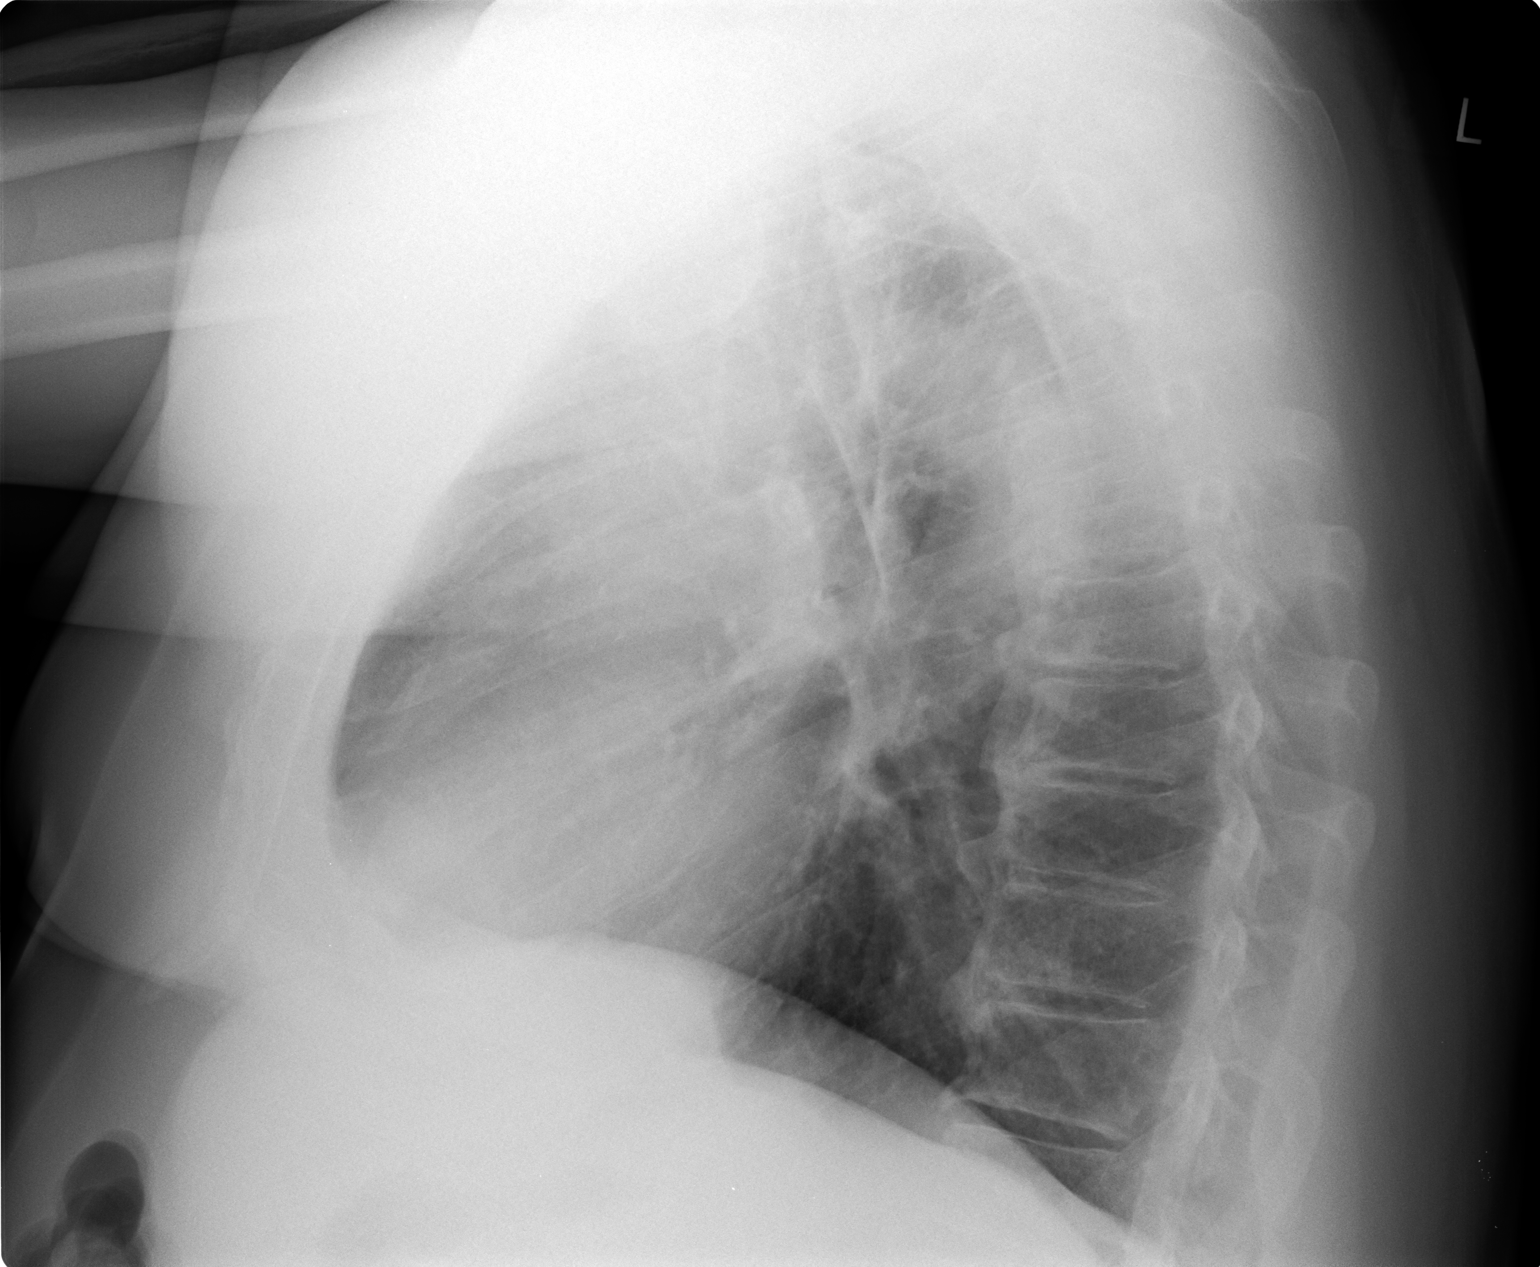

[2 of 2 positions shown; findings below may reference images not displayed]

FINDINGS: There is no edema or consolidation. Heart size and pulmonary
vascularity are normal. No adenopathy. There is degenerative change
in the thoracic spine. Patient appears to have a degree of
gynecomastia.
IMPRESSION: No edema or consolidation.  Question a degree of gynecomastia.

## 2016-10-07 ENCOUNTER — Other Ambulatory Visit: Payer: Self-pay | Admitting: Family

## 2016-10-09 NOTE — Telephone Encounter (Signed)
Pt aware appt made

## 2016-10-09 NOTE — Telephone Encounter (Signed)
[facility] PCP  Last seen 03/06/16 Glenard Haring

## 2016-10-09 NOTE — Telephone Encounter (Signed)
Last refill without being seen 

## 2016-11-24 ENCOUNTER — Ambulatory Visit (INDEPENDENT_AMBULATORY_CARE_PROVIDER_SITE_OTHER): Payer: Managed Care, Other (non HMO)

## 2016-11-24 ENCOUNTER — Ambulatory Visit (INDEPENDENT_AMBULATORY_CARE_PROVIDER_SITE_OTHER): Payer: Managed Care, Other (non HMO) | Admitting: Family Medicine

## 2016-11-24 ENCOUNTER — Encounter: Payer: Self-pay | Admitting: Family Medicine

## 2016-11-24 VITALS — BP 132/83 | HR 74 | Temp 97.0°F | Ht 73.0 in | Wt 316.0 lb

## 2016-11-24 DIAGNOSIS — N529 Male erectile dysfunction, unspecified: Secondary | ICD-10-CM | POA: Diagnosis not present

## 2016-11-24 DIAGNOSIS — E7800 Pure hypercholesterolemia, unspecified: Secondary | ICD-10-CM

## 2016-11-24 DIAGNOSIS — I10 Essential (primary) hypertension: Secondary | ICD-10-CM

## 2016-11-24 DIAGNOSIS — J301 Allergic rhinitis due to pollen: Secondary | ICD-10-CM

## 2016-11-24 DIAGNOSIS — B349 Viral infection, unspecified: Secondary | ICD-10-CM

## 2016-11-24 DIAGNOSIS — E349 Endocrine disorder, unspecified: Secondary | ICD-10-CM

## 2016-11-24 DIAGNOSIS — N400 Enlarged prostate without lower urinary tract symptoms: Secondary | ICD-10-CM | POA: Diagnosis not present

## 2016-11-24 DIAGNOSIS — E559 Vitamin D deficiency, unspecified: Secondary | ICD-10-CM

## 2016-11-24 LAB — URINALYSIS, COMPLETE
Bilirubin, UA: NEGATIVE
Glucose, UA: NEGATIVE
LEUKOCYTES UA: NEGATIVE
Nitrite, UA: NEGATIVE
PH UA: 7 (ref 5.0–7.5)
RBC, UA: NEGATIVE
Specific Gravity, UA: 1.02 (ref 1.005–1.030)
Urobilinogen, Ur: 2 mg/dL — ABNORMAL HIGH (ref 0.2–1.0)

## 2016-11-24 LAB — MICROSCOPIC EXAMINATION
BACTERIA UA: NONE SEEN
EPITHELIAL CELLS (NON RENAL): NONE SEEN /HPF (ref 0–10)
RBC, UA: NONE SEEN /hpf (ref 0–?)
RENAL EPITHEL UA: NONE SEEN /HPF
WBC, UA: NONE SEEN /hpf (ref 0–?)

## 2016-11-24 NOTE — Patient Instructions (Addendum)
Continue current medications. Continue good therapeutic lifestyle changes which include good diet and exercise. Fall precautions discussed with patient. If an FOBT was given today- please return it to our front desk. If you are over 64 years old - you may need Prevnar 82 or the adult Pneumonia vaccine.  **Flu shots are available--- please call and schedule a FLU-CLINIC appointment**  After your visit with [place] today you will receive a survey in the mail or online from [employer] regarding your care with [place]. Please take a moment to fill this out. Your feedback is very important to [place] as you can help [place] better understand your patient needs as well as improve your experience and satisfaction. WE CARE ABOUT YOU!!!   For the next couple of days drink plenty of fluids and stay well hydrated and avoid milk cheese ice cream and dairy products and caffeine as much as possible because of the loose stools that you reported this morning Continue with meds as doing We will call with lab work results as soon as these results become available Continue to work aggressively on weight loss through diet and exercise For allergic rhinitis use Flonase regularly 1-2 sprays each nostril and use nasal saline as directed

## 2016-11-24 NOTE — Progress Notes (Signed)
Subjective:    Patient ID: Arthur Foster, male    DOB: 07/11/1950, 66 y.o.   MRN: [id number]  HPI Pt here for follow up and management of chronic medical problems which includes hyperlipidemia, hypertension, and low testosterone. He is taking medication regularly.The patient is doing well overall. He comes back primarily because of his need to continue to take testosterone replacement and would like to defer getting his PSA and prostate exam even though the last one he had was in December 2017. He is due to get an FOBT and a chest x-ray and lab work he was going to return fasting for the lab work. We will defer the rectal exam today because he is has some loose stools today. We'll get this the next time he comes back. The patient's is feeling well overall and has normal energy. Sexual function is good. He denies any chest pain or shortness of breath. He denies any trouble with swallowing heartburn indigestion nausea vomiting blood in the stool or black tarry bowel movements. He does have some loose stools this morning and this may just be a virus. He's passing his water well and says sometimes when he goes he has to go again shortly after that and this is usually in the morning but has no burning pain or frequency. He also is in need of getting his eye exam every 2 years and he understands this and will get this done because he has insurance that covers this.    Patient Active Problem List   Diagnosis Date Noted  . Cervical somatic dysfunction 03/06/2016  . 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 Generalized anxiety disorder 03/06/2016  . Cough 11/05/2014  . Left knee pain 11/05/2014  . Allergic rhinitis 06/18/2013  . Severe obesity (BMI >= 40) ([place]) 06/18/2013  . BPH (benign prostatic hypertrophy) 07/03/2012  . Essential hypertension, benign 07/03/2012  . Depression 07/03/2012  . GERD (gastroesophageal reflux disease) 07/03/2012  . Testosterone deficiency 07/03/2012   Outpatient Encounter  Prescriptions as of 11/24/2016  Medication Sig  . ALPRAZolam (XANAX) 0.25 MG tablet TAKE 1 OR 2 TABLETS DAILY AS NEEDED  . ANDROGEL PUMP 20.25 MG/ACT (1.62%) GEL PLACE 4 PUMPS ONTO SKIN DAILY  . aspirin 81 MG tablet Take 81 mg by mouth daily.  . cetirizine (ZYRTEC) 10 MG tablet Take 1 tablet (10 mg total) by mouth daily.  [name] escitalopram (LEXAPRO) 20 MG tablet TAKE 1 TABLET DAILY  . fluticasone (FLONASE) 50 MCG/ACT nasal spray 1 TO 2 SPRAYS IN EACH NOSTRIL ONCE A DAY  . lisinopril (PRINIVIL,ZESTRIL) 20 MG tablet TAKE 1 TABLET EVERY DAY  . mometasone (ELOCON) 0.1 % cream APPLY SPARINGLY TWICE A DAY FOR 7 TO 10 DAYS  . Multiple Vitamin (MULTIVITAMIN) tablet Take 1 tablet by mouth daily.  . sildenafil (REVATIO) 20 MG tablet Take 2-5 tablets as needed for sexual activity  . [DISCONTINUED] hydrocortisone (ANUSOL-HC) 25 MG suppository Place 1 suppository (25 mg total) rectally 2 (two) times daily.   No facility-administered encounter medications on file as of 11/24/2016.        Review of Systems  Constitutional: Negative.   HENT: Negative.   Eyes: Negative.   Respiratory: Negative.   Cardiovascular: Negative.   Gastrointestinal: Negative.   Endocrine: Negative.   Genitourinary: Negative.   Musculoskeletal: Negative.   Skin: Negative.   Allergic/Immunologic: Negative.   Neurological: Negative.   Hematological: Negative.   Psychiatric/Behavioral: Negative.        Objective:   Physical Exam  Constitutional: He  is oriented to person, place, and time. He appears well-developed and well-nourished. No distress.  The patient is upbeat pleasant and alert  HENT:  Head: Normocephalic and atraumatic.  Right Ear: External ear normal.  Left Ear: External ear normal.  Mouth/Throat: Oropharynx is clear and moist. No oropharyngeal exudate.  Sinus congestion and turbinate swelling bilaterally  Eyes: Pupils are equal, round, and reactive to light. Conjunctivae and EOM are normal. Right eye  exhibits no discharge. Left eye exhibits no discharge. No scleral icterus.  Patient will try to get his eye exam done every 2 years and that will be coming up soon.  Neck: Normal range of motion. Neck supple. No thyromegaly present.  No thyromegaly anterior cervical adenopathy or bruits  Cardiovascular: Normal rate, regular rhythm, normal heart sounds and intact distal pulses.   No murmur heard. The heart is regular at 72/m  Pulmonary/Chest: Effort normal and breath sounds normal. No respiratory distress. He has no wheezes. He has no rales. He exhibits no tenderness.  Clear anteriorly and posteriorly and no axillary adenopathy  Abdominal: Soft. Bowel sounds are normal. He exhibits no mass. There is no tenderness. There is no rebound and no guarding.  No liver or spleen enlargement no masses no organ enlargement and no bruits with a small umbilical hernia  Genitourinary:  Genitourinary Comments: This was deferred today due to loose bowel movements.  Musculoskeletal: Normal range of motion. He exhibits no edema or deformity.  Lymphadenopathy:    He has no cervical adenopathy.  Neurological: He is alert and oriented to person, place, and time. He has normal reflexes. No cranial nerve deficit.  Skin: Skin is warm and dry. No rash noted.  Psychiatric: He has a normal mood and affect. His behavior is normal. Judgment and thought content normal.  Nursing note and vitals reviewed.   BP 132/83 (BP Location: Right Arm)   Pulse 74   Temp (!) 97 F (36.1 C) (Oral)   Ht '6\' 1"'$  (1.854 m)   Wt (!) 316 lb (143.3 kg)   BMI 41.69 kg/m   Chest x-ray with results pending===     Assessment & Plan:  1. Essential hypertension, benign -The blood pressure is good today and he will continue with current treatment - BMP8+EGFR; Future - CBC with Differential/Platelet; Future - Hepatic function panel; Future - DG Chest 2 View; Future  2. Hypotestosteronemia -Continue with aggressive therapeutic  lifestyle changes pending results of lab work - CBC with Differential/Platelet; Future - PSA, total and free; Future - Testosterone,Free and Total; Future - Urinalysis, Complete  3. Elevated cholesterol -Continue with therapeutic lifestyle changes pending results of lab work - CBC with Differential/Platelet; Future - Lipid panel; Future - DG Chest 2 View; Future  4. Benign prostatic hyperplasia without lower urinary tract symptoms -The patient has had a few more symptoms with feeling he is not emptying completely worse in the morning. We did not do a rectal exam today due to loose bowel movements. - CBC with Differential/Platelet; Future - PSA, total and free; Future - Testosterone,Free and Total; Future - Urinalysis, Complete  5. Vitamin D deficiency -Continue current treatment pending results of lab work - CBC with Differential/Platelet; Future - VITAMIN D 25 Hydroxy (Vit-D Deficiency, Fractures); Future  6. Viral syndrome -Clear liquids and bland diet for the next 2-3 days avoiding milk cheese ice cream and dairy products and caffeine.  7. Erectile dysfunction, unspecified erectile dysfunction type -Continue with testosterone replacement and generic sildenafil  8. Testosterone deficiency -Continue  with testosterone replacement and generic sildenafil  9. Morbid obesity ([place]) -Make every effort to lose weight through diet and exercise  10. Seasonal allergic rhinitis due to pollen -Use Flonase regularly and use nasal saline frequently during the day and avoid environmental situations that would aggravate your allergies  No orders of the defined types were placed in this encounter.  Patient Instructions  Continue current medications. Continue good therapeutic lifestyle changes which include good diet and exercise. Fall precautions discussed with patient. If an FOBT was given today- please return it to our front desk. If you are over 13 years old - you may need Prevnar 36  or the adult Pneumonia vaccine.  **Flu shots are available--- please call and schedule a FLU-CLINIC appointment**  After your visit with [place] today you will receive a survey in the mail or online from [employer] regarding your care with [place]. Please take a moment to fill this out. Your feedback is very important to [place] as you can help [place] better understand your patient needs as well as improve your experience and satisfaction. WE CARE ABOUT YOU!!!   For the next couple of days drink plenty of fluids and stay well hydrated and avoid milk cheese ice cream and dairy products and caffeine as much as possible because of the loose stools that you reported this morning Continue with meds as doing We will call with lab work results as soon as these results become available Continue to work aggressively on weight loss through diet and exercise For allergic rhinitis use Flonase regularly 1-2 sprays each nostril and use nasal saline as directed   Arrie Senate MD

## 2016-11-27 ENCOUNTER — Other Ambulatory Visit: Payer: Managed Care, Other (non HMO)

## 2016-11-27 DIAGNOSIS — E7800 Pure hypercholesterolemia, unspecified: Secondary | ICD-10-CM

## 2016-11-27 DIAGNOSIS — E349 Endocrine disorder, unspecified: Secondary | ICD-10-CM

## 2016-11-27 DIAGNOSIS — I10 Essential (primary) hypertension: Secondary | ICD-10-CM

## 2016-11-27 DIAGNOSIS — N400 Enlarged prostate without lower urinary tract symptoms: Secondary | ICD-10-CM

## 2016-11-27 DIAGNOSIS — E559 Vitamin D deficiency, unspecified: Secondary | ICD-10-CM

## 2016-11-28 LAB — PSA, TOTAL AND FREE
PSA, Free Pct: 19.2 %
PSA, Free: 0.23 ng/mL
Prostate Specific Ag, Serum: 1.2 ng/mL (ref 0.0–4.0)

## 2016-11-28 LAB — CBC WITH DIFFERENTIAL/PLATELET
Basophils Absolute: 0 10*3/uL (ref 0.0–0.2)
Basos: 1 %
EOS (ABSOLUTE): 0.4 10*3/uL (ref 0.0–0.4)
Eos: 6 %
Hematocrit: 49.4 % (ref 37.5–51.0)
Hemoglobin: 16.2 g/dL (ref 13.0–17.7)
Immature Grans (Abs): 0 10*3/uL (ref 0.0–0.1)
Immature Granulocytes: 1 %
Lymphocytes Absolute: 1.8 10*3/uL (ref 0.7–3.1)
Lymphs: 24 %
MCH: 30.2 pg (ref 26.6–33.0)
MCHC: 32.8 g/dL (ref 31.5–35.7)
MCV: 92 fL (ref 79–97)
MONOS ABS: 0.6 10*3/uL (ref 0.1–0.9)
Monocytes: 8 %
NEUTROS ABS: 4.6 10*3/uL (ref 1.4–7.0)
Neutrophils: 60 %
PLATELETS: 316 10*3/uL (ref 150–379)
RBC: 5.36 x10E6/uL (ref 4.14–5.80)
RDW: 14.1 % (ref 12.3–15.4)
WBC: 7.5 10*3/uL (ref 3.4–10.8)

## 2016-11-28 LAB — LIPID PANEL
CHOLESTEROL TOTAL: 179 mg/dL (ref 100–199)
Chol/HDL Ratio: 4.1 ratio (ref 0.0–5.0)
HDL: 44 mg/dL (ref >39)
LDL CALC: 111 mg/dL — AB (ref 0–99)
TRIGLYCERIDES: 119 mg/dL (ref 0–149)
VLDL CHOLESTEROL CAL: 24 mg/dL (ref 5–40)

## 2016-11-28 LAB — HEPATIC FUNCTION PANEL
ALK PHOS: 58 IU/L (ref 39–117)
ALT: 25 IU/L (ref 0–44)
AST: 26 IU/L (ref 0–40)
Albumin: 4.4 g/dL (ref 3.6–4.8)
BILIRUBIN, DIRECT: 0.25 mg/dL (ref 0.00–0.40)
Bilirubin Total: 1 mg/dL (ref 0.0–1.2)
Total Protein: 6.9 g/dL (ref 6.0–8.5)

## 2016-11-28 LAB — BMP8+EGFR
BUN / CREAT RATIO: 11 (ref 10–24)
BUN: 14 mg/dL (ref 8–27)
CO2: 25 mmol/L (ref 20–29)
Calcium: 9.4 mg/dL (ref 8.6–10.2)
Chloride: 102 mmol/L (ref 96–106)
Creatinine, Ser: 1.25 mg/dL (ref 0.76–1.27)
GFR, EST AFRICAN AMERICAN: 70 mL/min/{1.73_m2} (ref >59)
GFR, EST NON AFRICAN AMERICAN: 61 mL/min/{1.73_m2} (ref >59)
Glucose: 109 mg/dL — ABNORMAL HIGH (ref 65–99)
POTASSIUM: 5.2 mmol/L (ref 3.5–5.2)
SODIUM: 141 mmol/L (ref 134–144)

## 2016-11-28 LAB — TESTOSTERONE,FREE AND TOTAL
Testosterone, Free: 8.1 pg/mL (ref 6.6–18.1)
Testosterone: 231 ng/dL — ABNORMAL LOW (ref 264–916)

## 2016-11-28 LAB — VITAMIN D 25 HYDROXY (VIT D DEFICIENCY, FRACTURES): VIT D 25 HYDROXY: 37.5 ng/mL (ref 30.0–100.0)

## 2016-12-08 ENCOUNTER — Other Ambulatory Visit: Payer: Self-pay | Admitting: Family Medicine

## 2016-12-08 NOTE — Telephone Encounter (Signed)
Please call in alprazolam with 0 refills Will have to pick up androgel rx

## 2016-12-08 NOTE — Telephone Encounter (Signed)
Last seen 11/24/16  DWM  If approved route to nurse to call into [facility]

## 2016-12-13 ENCOUNTER — Ambulatory Visit (INDEPENDENT_AMBULATORY_CARE_PROVIDER_SITE_OTHER): Payer: Managed Care, Other (non HMO) | Admitting: Family Medicine

## 2016-12-13 ENCOUNTER — Encounter: Payer: Self-pay | Admitting: Family Medicine

## 2016-12-13 ENCOUNTER — Ambulatory Visit (INDEPENDENT_AMBULATORY_CARE_PROVIDER_SITE_OTHER): Payer: Managed Care, Other (non HMO)

## 2016-12-13 VITALS — BP 109/66 | HR 79 | Temp 97.6°F | Ht 73.0 in | Wt 315.0 lb

## 2016-12-13 DIAGNOSIS — M25562 Pain in left knee: Secondary | ICD-10-CM

## 2016-12-13 DIAGNOSIS — W1800XA Striking against unspecified object with subsequent fall, initial encounter: Secondary | ICD-10-CM

## 2016-12-13 MED ORDER — DICLOFENAC SODIUM 75 MG PO TBEC: 75.0000 mg | DELAYED_RELEASE_TABLET | Freq: Two times a day (BID) | ORAL | 0 refills | Status: DC

## 2016-12-13 NOTE — Patient Instructions (Signed)
RICE for Routine Care of Injuries Theroutine careofmanyinjuriesincludes rest, ice, compression, and elevation (RICE therapy). RICE therapy is often recommended for injuries to soft tissues, such as a muscle strain, ligament injuries, bruises, and overuse injuries. It can also be used for some bony injuries. Using RICE therapy can help to relieve pain, lessen swelling, and enable your body to heal. Rest Rest is required to allow your body to heal. This usually involves reducing your normal activities and avoiding use of the injured part of your body. Generally, you can return to your normal activities when you are comfortable and have been given permission by your health care provider. Ice  Icing your injury helps to keep the swelling down, and it lessens pain. Do not apply ice directly to your skin.  Put ice in a plastic bag.  Place a towel between your skin and the bag.  Leave the ice on for 20 minutes, 2-3 times a day.  Do this for as long as you are directed by your health care provider. Compression Compression means putting pressure on the injured area. Compression helps to keep swelling down, gives support, and helps with discomfort. Compression may be done with an elastic bandage. If an elastic bandage has been applied, follow these general tips:  Remove and reapply the bandage every 3-4 hours or as directed by your health care provider.  Make sure the bandage is not wrapped too tightly, because this can cut off circulation. If part of your body beyond the bandage becomes blue, numb, cold, swollen, or more painful, your bandage is most likely too tight. If this occurs, remove your bandage and reapply it more loosely.  See your health care provider if the bandage seems to be making your problems worse rather than better.  Elevation  Elevation means keeping the injured area raised. This helps to lessen swelling and decrease pain. If possible, your injured area should be elevated at  or above the level of your heart or the center of your chest. When should I seek medical care?  If your pain and swelling continue.  If your symptoms are getting worse rather than improving. These symptoms may indicate that further evaluation or further X-rays are needed. Sometimes, X-rays may not show a small broken bone (fracture) until a number of days later. Make a follow-up appointment with your health care provider. When should I seek immediate medical care?  If you have sudden severe pain at or below the area of your injury.  If you have redness or increased swelling around your injury.  If you have tingling or numbness at or below the area of your injury that does not improve after you remove the elastic bandage. This information is not intended to replace advice given to you by your health care provider. Make sure you discuss any questions you have with your health care provider. Document Released: 07/02/2000 Document Revised: 08/24/2015 Document Reviewed: 02/25/2014 Elsevier Interactive Patient Education  2017 [employer].

## 2016-12-13 NOTE — Progress Notes (Signed)
Subjective: Arthur Foster pain PCP: Chipper Herb, MD VEH:MCNOBS Triggs is a 66 y.o. male presenting to clinic today for:  1. Knee pain Patient reports onset of left knee pain 5 days ago.  Pain is located along the anterior lateral aspect of the knee.  No radiation down leg or up to hip.  Endorses preceding injury, which he describes as tripping over a stack of wood at work. He notes that he fell onto his left side twisting his left lower extremity.  Denies numbness, tingling, weakness, redness, increased heat. Endorses bruising over the area of impact and knee swelling. He is unsure how quickly the swelling started but he reports that it was most notable later that evening.  Patient has tried topical Voltaren gel, with mild relief.  He had bilateral knee x-rays performed in August 2016 which showed mild tricompartmental degenerative changes. These were repeated in February 2018 which again showed narrowing of the medial compartment left greater than right. He is seeing Dr. Ricki Rodriguez for chronic knee pain. He reports his knee pain today is different than his normal knee pain. He reports that he is expecting to have a knee surgery within the next couple of years for degenerative changes. No h/o previous injury or surgery. Additionally he does have a history of GERD. He no longer needs to take medication for this as he has been asymptomatic for some time. No history of stomach ulcer or gastric bleeding.   Allergies  Allergen Reactions  . Ambien [Zolpidem Tartrate]    Past Medical History:  Diagnosis Date  . Anxiety   . BPH (benign prostatic hyperplasia)   . Depression   . Hypertension   . Testosterone deficiency    Family History  Problem Relation Age of Onset  . Hypertension Mother   . Alzheimer's disease Mother   . Hypertension Father   . Alzheimer's disease Father    Social Hx: former smoker.Current medications reviewed.   ROS: Per HPI  Objective: Office vital signs reviewed. BP 109/66    Pulse 79   Temp 97.6 F (36.4 C) (Oral)   Ht 6\' 1"  (1.854 m)   Wt (!) 315 lb (142.9 kg)   BMI 41.56 kg/m   Physical Examination:  General: Awake, alert, obese, well appearing male, No acute distress HEENT: [place]/AT    Eyes: PERRLA, extraocular membranes intact, sclera white Extremities: warm, well perfused, No edema, cyanosis or clubbing; +2 pulses bilaterally MSK: normal gait and normal station  LLE: Full painless active range of motion. No erythema. Ecchymosis appreciated along the lateral aspect of the lower extremities starting at mid knee down to the upper calf. Joint effusion appreciated with associated soft tissue swelling. He does have tenderness to palpation over the patellar tendon and anterior tibia. He also has tenderness to palpation along the proximal fibula. No tenderness to palpation to the posterior popliteal space. No palpable masses or bony abnormalities. Negative anterior and posterior drawer testing. No LCL or MCL laxity. Pain with [place] present. Skin: dry; intact; ecchymosis as above Neuro: 5/5 Strength and light touch sensation grossly intact  No results found.  Assessment/ Plan: 66 y.o. male   1. Acute pain of left knee He has full active range of motion on exam. His exam was remarkable for ecchymosis along the lateral aspect of the lower extremity and associated patellar tendon, anterior tibia, and lateral knee tenderness to palpation. Because he had bony tenderness to palpation, and x-ray was obtained.  Upon personal review of plain films of  left knee, there are no obvious fractures or dislocation. Official review by radiologist pending. He has a brace at home that he is able to use for compression of knee. I encouraged him to use this. I encouraged ice, elevation and avoiding movements which worsen pain. We discussed possible need for PPI while using oral NSAID. I prescribed him diclofenac to take by mouth twice a day for the next 5 days then as needed twice a  day. I encouraged him to follow up either here or with his orthopedic surgeon in the next 2 weeks if pain is persistent or if it worsens. Would consider MRI of knee at that point. Follow up as needed. - DG Knee 1-2 Views Left; Future - diclofenac (VOLTAREN) 75 MG EC tablet; Take 1 tablet (75 mg total) by mouth 2 (two) times daily.  Dispense: 30 tablet; Refill: 0  2. Fall from bumping against object as cause of accidental injury See above.   Orders Placed This Encounter  Procedures  . DG Knee 1-2 Views Left    Standing Status:   Future    Number of Occurrences:   1    Standing Expiration Date:   02/12/2018    Order Specific Question:   Reason for Exam (SYMPTOM  OR DIAGNOSIS REQUIRED)    Answer:   knee pain after a fall.    Order Specific Question:   Preferred imaging location?    Answer:   Internal   Meds ordered this encounter  Medications  . diclofenac (VOLTAREN) 75 MG EC tablet    Sig: Take 1 tablet (75 mg total) by mouth 2 (two) times daily.    Dispense:  30 tablet    Refill:  [place], DO [place] [phone]

## 2017-01-06 ENCOUNTER — Other Ambulatory Visit: Payer: Self-pay | Admitting: Nurse Practitioner

## 2017-02-19 ENCOUNTER — Other Ambulatory Visit: Payer: Self-pay | Admitting: Physician Assistant

## 2017-04-09 ENCOUNTER — Other Ambulatory Visit: Payer: Self-pay | Admitting: Family Medicine

## 2017-04-17 ENCOUNTER — Other Ambulatory Visit: Payer: Self-pay | Admitting: Family Medicine

## 2017-05-17 ENCOUNTER — Other Ambulatory Visit: Payer: Self-pay | Admitting: Family Medicine

## 2017-05-24 ENCOUNTER — Encounter: Payer: Self-pay | Admitting: Family Medicine

## 2017-05-24 ENCOUNTER — Other Ambulatory Visit: Payer: Self-pay | Admitting: *Deleted

## 2017-05-24 ENCOUNTER — Ambulatory Visit: Payer: Managed Care, Other (non HMO) | Admitting: Family Medicine

## 2017-05-24 VITALS — BP 139/81 | HR 74 | Temp 97.0°F | Ht 73.0 in | Wt 303.0 lb

## 2017-05-24 DIAGNOSIS — I10 Essential (primary) hypertension: Secondary | ICD-10-CM | POA: Diagnosis not present

## 2017-05-24 DIAGNOSIS — N529 Male erectile dysfunction, unspecified: Secondary | ICD-10-CM

## 2017-05-24 DIAGNOSIS — E7800 Pure hypercholesterolemia, unspecified: Secondary | ICD-10-CM | POA: Diagnosis not present

## 2017-05-24 DIAGNOSIS — Z0000 Encounter for general adult medical examination without abnormal findings: Secondary | ICD-10-CM

## 2017-05-24 DIAGNOSIS — J301 Allergic rhinitis due to pollen: Secondary | ICD-10-CM

## 2017-05-24 DIAGNOSIS — E559 Vitamin D deficiency, unspecified: Secondary | ICD-10-CM

## 2017-05-24 DIAGNOSIS — N400 Enlarged prostate without lower urinary tract symptoms: Secondary | ICD-10-CM

## 2017-05-24 DIAGNOSIS — E349 Endocrine disorder, unspecified: Secondary | ICD-10-CM

## 2017-05-24 LAB — MICROSCOPIC EXAMINATION
EPITHELIAL CELLS (NON RENAL): NONE SEEN /HPF (ref 0–10)
RBC MICROSCOPIC, UA: NONE SEEN /HPF (ref 0–?)
RENAL EPITHEL UA: NONE SEEN /HPF

## 2017-05-24 LAB — URINALYSIS, COMPLETE
Bilirubin, UA: NEGATIVE
Glucose, UA: NEGATIVE
Ketones, UA: NEGATIVE
Nitrite, UA: NEGATIVE
PH UA: 6 (ref 5.0–7.5)
RBC, UA: NEGATIVE
Specific Gravity, UA: 1.025 (ref 1.005–1.030)
UUROB: 1 mg/dL (ref 0.2–1.0)

## 2017-05-24 MED ORDER — DICLOFENAC SODIUM 1 % TD GEL: 2.0000 g | Freq: Four times a day (QID) | TRANSDERMAL | 3 refills | Status: DC | PRN

## 2017-05-24 MED ORDER — SILDENAFIL CITRATE 20 MG PO TABS: ORAL_TABLET | ORAL | 6 refills | Status: DC

## 2017-05-24 NOTE — Patient Instructions (Addendum)
Medicare Annual Wellness Visit  [facility] and the medical providers at [facility] strive to bring you the best medical care.  In doing so we not only want to address your current medical conditions and concerns but also to detect new conditions early and prevent illness, disease and health-related problems.    Medicare offers a yearly Wellness Visit which allows our clinical staff to assess your need for preventative services including immunizations, lifestyle education, counseling to decrease risk of preventable diseases and screening for fall risk and other medical concerns.    This visit is provided free of charge (no copay) for all Medicare recipients. The clinical pharmacists at [facility] have begun to conduct these Wellness Visits which will also include a thorough review of all your medications.    As you primary medical provider recommend that you make an appointment for your Annual Wellness Visit if you have not done so already this year.  You may set up this appointment before you leave today or you may call back ([phone]) and schedule an appointment.  Please make sure when you call that you mention that you are scheduling your Annual Wellness Visit with the clinical pharmacist so that the appointment may be made for the proper length of time.     Continue current medications. Continue good therapeutic lifestyle changes which include good diet and exercise. Fall precautions discussed with patient. If an FOBT was given today- please return it to our front desk. If you are over 74 years old - you may need Prevnar 49 or the adult Pneumonia vaccine.  **Flu shots are available--- please call and schedule a FLU-CLINIC appointment**  After your visit with [place] today you will receive a survey in the mail or online from [employer] regarding your care with [place]. Please take a moment to fill this out. Your feedback is very  important to [place] as you can help [place] better understand your patient needs as well as improve your experience and satisfaction. WE CARE ABOUT YOU!!!   We will call when lab work is available and with x-ray results when they become available Continue with aggressive therapeutic lifestyle changes including diet and exercise and weight loss. Please schedule eye exam We will check and confirm the date of when you need to have your next colonoscopy in who that is

## 2017-05-24 NOTE — Progress Notes (Signed)
Subjective:    Patient ID: Arthur Foster, male    DOB: Aug 11, 1950, 67 y.o.   MRN: [id number]  HPI  Patient is here today for annual wellness exam and follow up of chronic medical problems which includes hypertension and hyperlipidemia. He is taking medication.  The patient is doing well overall.  There is no specific complaints and he is here today for a physical exam.  He is requesting refills on generic Viagra and Voltaren gel.  He will get lab work today a urinalysis and an EKG and will be given an FOBT to return.  The good news is that his weight is down about 12 pounds since his last visit.  Vital signs are stable.  He has hypertension and testosterone deficiency along with morbid obesity.  He takes Lexapro for depression.  The patient denies any chest pain pressure tightness or shortness of breath. He has no change in his bowel habits.  He denies any trouble with swallowing heartburn indigestion nausea vomiting diarrhea or blood in the stool.  He does have occasional soft bowel movements.  He is passing his water without problems and he says that his sexual desires are somewhat diminished compared to years ago and I told him that was probably normal.  He is using 4 pumps of AndroGel daily.  He is passing his water without problems.  It sounds like he may be in need of an eye exam as he has not had one for a good while.     Patient Active Problem List   Diagnosis Date Noted  . Cervical somatic dysfunction 03/06/2016  . 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 Generalized anxiety disorder 03/06/2016  . Cough 11/05/2014  . Left knee pain 11/05/2014  . Allergic rhinitis 06/18/2013  . Severe obesity (BMI >= 40) ([place]) 06/18/2013  . BPH (benign prostatic hypertrophy) 07/03/2012  . Essential hypertension, benign 07/03/2012  . Depression 07/03/2012  . GERD (gastroesophageal reflux disease) 07/03/2012  . Testosterone deficiency 07/03/2012   Outpatient Encounter Medications as of 05/24/2017    Medication Sig  . ALPRAZolam (XANAX) 0.25 MG tablet TAKE 1 OR 2 TABLETS DAILY AS NEEDED  . ANDROGEL PUMP 20.25 MG/ACT (1.62%) GEL PLACE 4 PUMPS ONTO SKIN DAILY  . cetirizine (ZYRTEC) 10 MG tablet TAKE 1 TABLET DAILY  . escitalopram (LEXAPRO) 20 MG tablet TAKE 1 TABLET DAILY  . lisinopril (PRINIVIL,ZESTRIL) 20 MG tablet TAKE 1 TABLET EVERY DAY  . mometasone (ELOCON) 0.1 % cream APPLY SPARINGLY TWICE A DAY FOR 7 TO 10 DAYS  . sildenafil (REVATIO) 20 MG tablet Take 2-5 tablets as needed for sexual activity  . [DISCONTINUED] aspirin 81 MG tablet Take 81 mg by mouth daily.  . [DISCONTINUED] diclofenac (VOLTAREN) 75 MG EC tablet Take 1 tablet (75 mg total) by mouth 2 (two) times daily.  . [DISCONTINUED] fluticasone (FLONASE) 50 MCG/ACT nasal spray 1 TO 2 SPRAYS IN EACH NOSTRIL ONCE A DAY  . [DISCONTINUED] Multiple Vitamin (MULTIVITAMIN) tablet Take 1 tablet by mouth daily.   No facility-administered encounter medications on file as of 05/24/2017.      Review of Systems  Constitutional: Negative.   HENT: Negative.   Eyes: Negative.   Respiratory: Negative.   Cardiovascular: Negative.   Gastrointestinal: Negative.   Endocrine: Negative.   Genitourinary: Negative.   Musculoskeletal: Negative.   Skin: Negative.   Allergic/Immunologic: Negative.   Neurological: Negative.   Hematological: Negative.   Psychiatric/Behavioral: Negative.        Objective:   Physical Exam  Constitutional: He is oriented to person, place, and time. He appears well-developed and well-nourished. No distress.  The patient is pleasant and relaxed and confident in a good way.  He has a positive spirit about him.  HENT:  Head: Normocephalic and atraumatic.  Right Ear: External ear normal.  Left Ear: External ear normal.  Mouth/Throat: Oropharynx is clear and moist. No oropharyngeal exudate.  Nasal turbinate congestion and swelling  Eyes: Conjunctivae and EOM are normal. Pupils are equal, round, and reactive to  light. Right eye exhibits no discharge. Left eye exhibits no discharge. No scleral icterus.  Needs eye exam  Neck: Normal range of motion. Neck supple. No thyromegaly present.  No thyromegaly anterior cervical adenopathy or bruits  Cardiovascular: Normal rate, regular rhythm, normal heart sounds and intact distal pulses.  No murmur heard. The heart is regular at 72/min  Pulmonary/Chest: Effort normal and breath sounds normal. No respiratory distress. He has no wheezes. He has no rales. He exhibits no tenderness.  Clear anteriorly and posteriorly and no axillary adenopathy.  The left breast area has a slight area of fullness in a circumferential nature in the left upper quadrant.  This is noted because the patient says he has felt fullness in that area.  It is not painful.  It is not nodular in nature.  We will continue to monitor this.  Abdominal: Soft. Bowel sounds are normal. He exhibits no mass. There is no tenderness. There is no rebound and no guarding.  No tenderness or organ enlargement or bruits  Genitourinary: Rectum normal and penis normal.  Genitourinary Comments: The prostate is slightly enlarged but smooth.  There were no rectal masses.  External genitalia were within normal limits with no hernias being palpated or masses being palpated.  Musculoskeletal: Normal range of motion. He exhibits no edema.  Lymphadenopathy:    He has no cervical adenopathy.  Neurological: He is alert and oriented to person, place, and time. He has normal reflexes. No cranial nerve deficit.  Distal reflexes were slightly diminished on the right compared to the left  Skin: Skin is warm and dry. No rash noted.  Psychiatric: He has a normal mood and affect. His behavior is normal. Judgment and thought content normal.  Nursing note and vitals reviewed.   BP 139/81 (BP Location: Left Arm)   Pulse 74   Temp (!) 97 F (36.1 C) (Oral)   Ht _0  (1.854 m)   Wt (!) 303 lb (137.4 kg)   BMI 39.98 kg/m   EKG  with results pending===      Assessment & Plan:  1. Annual physical exam -Patient needs eye exam - CBC with Differential/Platelet - BMP8+EGFR - Lipid panel - VITAMIN D 25 Hydroxy (Vit-D Deficiency, Fractures) - Hepatic function panel - PSA, total and free - Testosterone,Free and Total - Urinalysis, Complete - EKG 12-Lead  2. Essential hypertension, benign -The blood pressure is good he will continue with current treatment - CBC with Differential/Platelet - BMP8+EGFR - Hepatic function panel - EKG 12-Lead  3. Hypotestosteronemia -Continue with AndroGel 4 pumps daily pending results of lab work - CBC with Differential/Platelet - PSA, total and free - Testosterone,Free and Total  4. Vitamin D deficiency -Continue with vitamin D replacement pending results of lab work - CBC with Differential/Platelet - VITAMIN D 25 Hydroxy (Vit-D Deficiency, Fractures)  5. Benign prostatic hyperplasia without lower urinary tract symptoms -No complaints with voiding. - CBC with Differential/Platelet - PSA, total and free - Testosterone,Free and Total  6. Elevated cholesterol - CBC with Differential/Platelet - Lipid panel - Hepatic function panel - EKG 12-Lead  7. Morbid obesity ([place]) -He will continue to make all efforts at weight loss through diet and exercise  8. Erectile dysfunction, unspecified erectile dysfunction type -Continue AndroGel and testosterone replacement with AndroGel  9. Non-seasonal allergic rhinitis due to pollen -Regular use of Nasacort may be helpful and he purchased this himself over-the-counter.  1 spray each nostril daily at bedtime.  Meds ordered this encounter  Medications  . sildenafil (REVATIO) 20 MG tablet    Sig: Take 2-5 tablets as needed for sexual activity    Dispense:  50 tablet    Refill:  6  . diclofenac sodium (VOLTAREN) 1 % GEL    Sig: Apply 2 g topically 4 (four) times daily as needed.    Dispense:  100 g    Refill:  3   Patient  Instructions                       Medicare Annual Wellness Visit  [facility] and the medical providers at [facility] strive to bring you the best medical care.  In doing so we not only want to address your current medical conditions and concerns but also to detect new conditions early and prevent illness, disease and health-related problems.    Medicare offers a yearly Wellness Visit which allows our clinical staff to assess your need for preventative services including immunizations, lifestyle education, counseling to decrease risk of preventable diseases and screening for fall risk and other medical concerns.    This visit is provided free of charge (no copay) for all Medicare recipients. The clinical pharmacists at [facility] have begun to conduct these Wellness Visits which will also include a thorough review of all your medications.    As you primary medical provider recommend that you make an appointment for your Annual Wellness Visit if you have not done so already this year.  You may set up this appointment before you leave today or you may call back ([phone]) and schedule an appointment.  Please make sure when you call that you mention that you are scheduling your Annual Wellness Visit with the clinical pharmacist so that the appointment may be made for the proper length of time.     Continue current medications. Continue good therapeutic lifestyle changes which include good diet and exercise. Fall precautions discussed with patient. If an FOBT was given today- please return it to our front desk. If you are over 92 years old - you may need Prevnar 52 or the adult Pneumonia vaccine.  **Flu shots are available--- please call and schedule a FLU-CLINIC appointment**  After your visit with [place] today you will receive a survey in the mail or online from [employer] regarding your care with [place]. Please take a moment to fill this out. Your feedback  is very important to [place] as you can help [place] better understand your patient needs as well as improve your experience and satisfaction. WE CARE ABOUT YOU!!!   We will call when lab work is available and with x-ray results when they become available Continue with aggressive therapeutic lifestyle changes including diet and exercise and weight loss. Please schedule eye exam We will check and confirm the date of when you need to have your next colonoscopy in who that is    Arrie Senate MD

## 2017-05-25 LAB — CBC WITH DIFFERENTIAL/PLATELET
Basophils Absolute: 0 10*3/uL (ref 0.0–0.2)
Basos: 0 %
EOS (ABSOLUTE): 0.3 10*3/uL (ref 0.0–0.4)
EOS: 4 %
HEMATOCRIT: 50 % (ref 37.5–51.0)
Hemoglobin: 17.2 g/dL (ref 13.0–17.7)
IMMATURE GRANULOCYTES: 0 %
Immature Grans (Abs): 0 10*3/uL (ref 0.0–0.1)
LYMPHS ABS: 1.8 10*3/uL (ref 0.7–3.1)
Lymphs: 27 %
MCH: 30.8 pg (ref 26.6–33.0)
MCHC: 34.4 g/dL (ref 31.5–35.7)
MCV: 89 fL (ref 79–97)
MONOS ABS: 0.5 10*3/uL (ref 0.1–0.9)
Monocytes: 7 %
NEUTROS ABS: 4.2 10*3/uL (ref 1.4–7.0)
Neutrophils: 62 %
PLATELETS: 326 10*3/uL (ref 150–379)
RBC: 5.59 x10E6/uL (ref 4.14–5.80)
RDW: 14.3 % (ref 12.3–15.4)
WBC: 6.8 10*3/uL (ref 3.4–10.8)

## 2017-05-25 LAB — PSA, TOTAL AND FREE
PROSTATE SPECIFIC AG, SERUM: 1.4 ng/mL (ref 0.0–4.0)
PSA, Free Pct: 17.9 %
PSA, Free: 0.25 ng/mL

## 2017-05-25 LAB — HEPATIC FUNCTION PANEL
ALT: 26 IU/L (ref 0–44)
AST: 24 IU/L (ref 0–40)
Albumin: 4.6 g/dL (ref 3.6–4.8)
Alkaline Phosphatase: 64 IU/L (ref 39–117)
Bilirubin Total: 1.5 mg/dL — ABNORMAL HIGH (ref 0.0–1.2)
Bilirubin, Direct: 0.36 mg/dL (ref 0.00–0.40)
TOTAL PROTEIN: 7.2 g/dL (ref 6.0–8.5)

## 2017-05-25 LAB — LIPID PANEL
CHOL/HDL RATIO: 4 ratio (ref 0.0–5.0)
Cholesterol, Total: 182 mg/dL (ref 100–199)
HDL: 46 mg/dL (ref >39)
LDL Calculated: 123 mg/dL — ABNORMAL HIGH (ref 0–99)
Triglycerides: 67 mg/dL (ref 0–149)
VLDL CHOLESTEROL CAL: 13 mg/dL (ref 5–40)

## 2017-05-25 LAB — BMP8+EGFR
BUN / CREAT RATIO: 14 (ref 10–24)
BUN: 15 mg/dL (ref 8–27)
CO2: 23 mmol/L (ref 20–29)
Calcium: 9.3 mg/dL (ref 8.6–10.2)
Chloride: 102 mmol/L (ref 96–106)
Creatinine, Ser: 1.06 mg/dL (ref 0.76–1.27)
GFR, EST AFRICAN AMERICAN: 85 mL/min/{1.73_m2} (ref >59)
GFR, EST NON AFRICAN AMERICAN: 74 mL/min/{1.73_m2} (ref >59)
Glucose: 107 mg/dL — ABNORMAL HIGH (ref 65–99)
POTASSIUM: 4.6 mmol/L (ref 3.5–5.2)
Sodium: 140 mmol/L (ref 134–144)

## 2017-05-25 LAB — VITAMIN D 25 HYDROXY (VIT D DEFICIENCY, FRACTURES): VIT D 25 HYDROXY: 33.5 ng/mL (ref 30.0–100.0)

## 2017-05-25 LAB — TESTOSTERONE,FREE AND TOTAL
TESTOSTERONE: 303 ng/dL (ref 264–916)
Testosterone, Free: 9.1 pg/mL (ref 6.6–18.1)

## 2017-06-18 ENCOUNTER — Other Ambulatory Visit: Payer: Self-pay | Admitting: Family Medicine

## 2017-06-18 ENCOUNTER — Telehealth: Payer: Self-pay | Admitting: *Deleted

## 2017-06-18 MED ORDER — AMOXICILLIN-POT CLAVULANATE 875-125 MG PO TABS: 1.0000 | ORAL_TABLET | Freq: Two times a day (BID) | ORAL | 0 refills | Status: DC

## 2017-06-18 NOTE — Telephone Encounter (Signed)
Pt has URI Productive cough (green), head and chest congestion Pt's mother passed away on 2022/07/21 and pt has to leave to go out of town Pt requesting antibiotic Please review and advise

## 2017-06-18 NOTE — Telephone Encounter (Signed)
augmemtin rx sent to pharmacy

## 2017-06-18 NOTE — Telephone Encounter (Signed)
Left detailed message on pt's voice mail

## 2017-06-19 NOTE — Telephone Encounter (Signed)
Last seen 05/24/17  DWM

## 2017-06-22 ENCOUNTER — Telehealth: Payer: Self-pay | Admitting: Family Medicine

## 2017-06-22 MED ORDER — BENZONATATE 100 MG PO CAPS: 100.0000 mg | ORAL_CAPSULE | Freq: Two times a day (BID) | ORAL | 0 refills | Status: DC | PRN

## 2017-06-22 NOTE — Telephone Encounter (Signed)
PT is in [place] for his mothers funeral and he was prescribed amoxicillin-clavulanate (AUGMENTIN) 875-125 MG tablet already and he has been coughing really bad ecspically last night to point were he could not sleep. He states that one time before we sent in [place] and he is wanting to know if we could send some to the [facility] in [place]  [id number] phone [phone]. He also stated that he thinks he may have strained his neck from the coughing and wants to know if he can take indoprofen or tylenol or what does Dr Laurance Flatten Suggest.

## 2017-06-22 NOTE — Telephone Encounter (Signed)
Med sent in - pt aware

## 2017-06-22 NOTE — Telephone Encounter (Signed)
First of all tell him how sorry we are about the passing of his mother Please call in a prescription for him for Tessalon Perles to take 1 twice daily as directed

## 2017-06-26 MED ORDER — BENZONATATE 100 MG PO CAPS: 100.0000 mg | ORAL_CAPSULE | Freq: Two times a day (BID) | ORAL | 0 refills | Status: DC | PRN

## 2017-06-26 NOTE — Addendum Note (Signed)
Addended by: Antonietta Barcelona D on: 06/26/2017 08:34 AM   Modules accepted: Orders

## 2017-07-09 ENCOUNTER — Ambulatory Visit (INDEPENDENT_AMBULATORY_CARE_PROVIDER_SITE_OTHER): Payer: Managed Care, Other (non HMO)

## 2017-07-09 ENCOUNTER — Ambulatory Visit: Payer: Managed Care, Other (non HMO) | Admitting: Family Medicine

## 2017-07-09 ENCOUNTER — Encounter: Payer: Self-pay | Admitting: Family Medicine

## 2017-07-09 VITALS — BP 133/89 | HR 73 | Temp 98.5°F | Ht 73.0 in | Wt 304.0 lb

## 2017-07-09 DIAGNOSIS — J189 Pneumonia, unspecified organism: Secondary | ICD-10-CM | POA: Diagnosis not present

## 2017-07-09 DIAGNOSIS — R062 Wheezing: Secondary | ICD-10-CM

## 2017-07-09 MED ORDER — ESCITALOPRAM OXALATE 20 MG PO TABS: 20.0000 mg | ORAL_TABLET | Freq: Every day | ORAL | 0 refills | Status: DC

## 2017-07-09 MED ORDER — BENZONATATE 100 MG PO CAPS: 100.0000 mg | ORAL_CAPSULE | Freq: Two times a day (BID) | ORAL | 0 refills | Status: DC | PRN

## 2017-07-09 MED ORDER — DOXYCYCLINE HYCLATE 100 MG PO TABS: 100.0000 mg | ORAL_TABLET | Freq: Two times a day (BID) | ORAL | 0 refills | Status: DC

## 2017-07-09 MED ORDER — PREDNISONE 20 MG PO TABS: ORAL_TABLET | ORAL | 0 refills | Status: DC

## 2017-07-09 NOTE — Progress Notes (Signed)
BP 133/89   Pulse 73   Temp 98.5 F (36.9 C) (Oral)   Ht 6\' 1"  (1.854 m)   Wt (!) 304 lb (137.9 kg)   BMI 40.11 kg/m    Subjective:    Patient ID: Arthur Foster, male    DOB: [birth year], 67 y.o.   MRN: [id number]  HPI: Arthur Foster is a 67 y.o. male presenting on 07/09/2017 for Cough, chest hurts when he coughs, wheezing, nasal congestio (green phlegm; taking Zyrtec, Claritin D, Nyquil, Flonase)   HPI Cough and chest congestion and wheezing Patient comes in with complaints of cough and chest congestion and wheezing that is been going on for the past 10 days.  He says he came in and got an antibiotic 8 days ago and finished that yesterday and felt a little better on it but still is having a lot of cough and chest congestion and wheezing and feels like it is worsened today again now that he is off of the antibiotic.  He has been having green phlegm and nasal congestion and wheezing and his lower ribs hurt because he is coughing so much.  He is especially coughing bad at nighttime which keeps him up some.  The [employer] did help with that.  He has been taking Zyrtec and Claritin-D and NyQuil and Flonase which have been helping but do not seem to be clearing this illness.  He denies any fevers or chills or shortness of breath.  He denies any sick contacts that he knows of.  Relevant past medical, surgical, family and social history reviewed and updated as indicated. Interim medical history since our last visit reviewed. Allergies and medications reviewed and updated.  Review of Systems  Constitutional: Negative for chills and fever.  HENT: Positive for congestion, postnasal drip, rhinorrhea, sinus pressure, sneezing and sore throat. Negative for ear discharge, ear pain and voice change.   Eyes: Negative for pain, discharge, redness and visual disturbance.  Respiratory: Positive for cough, chest tightness and wheezing. Negative for shortness of breath.   Cardiovascular: Negative for chest  pain and leg swelling.  Musculoskeletal: Negative for gait problem.  Skin: Negative for rash.  All other systems reviewed and are negative.   Per HPI unless specifically indicated above   Allergies as of 07/09/2017      Reactions   Ambien [zolpidem Tartrate]       Medication List        Accurate as of 07/09/17  8:39 AM. Always use your most recent med list.          ALPRAZolam 0.25 MG tablet Commonly known as:  XANAX TAKE 1 OR 2 TABLETS DAILY AS NEEDED   ANDROGEL PUMP 20.25 MG/ACT (1.62%) Gel Generic drug:  Testosterone PLACE 4 PUMPS ONTO SKIN DAILY   benzonatate 100 MG capsule Commonly known as:  TESSALON Take 1 capsule (100 mg total) by mouth 2 (two) times daily as needed for cough.   cetirizine 10 MG tablet Commonly known as:  ZYRTEC TAKE 1 TABLET DAILY   diclofenac sodium 1 % Gel Commonly known as:  VOLTAREN Apply 2 g topically 4 (four) times daily as needed.   doxycycline 100 MG tablet Commonly known as:  VIBRA-TABS Take 1 tablet (100 mg total) by mouth 2 (two) times daily. 1 po bid   escitalopram 20 MG tablet Commonly known as:  LEXAPRO Take 1 tablet (20 mg total) by mouth daily.   lisinopril 20 MG tablet Commonly known as:  PRINIVIL,ZESTRIL TAKE 1  TABLET EVERY DAY   mometasone 0.1 % cream Commonly known as:  ELOCON APPLY SPARINGLY TWICE A DAY FOR 7 TO 10 DAYS   predniSONE 20 MG tablet Commonly known as:  DELTASONE Take 3 tabs daily for 1 week, then 2 tabs daily for week 2, then 1 tab daily for week 3.   sildenafil 20 MG tablet Commonly known as:  REVATIO Take 2-5 tablets as needed for sexual activity          Objective:    BP 133/89   Pulse 73   Temp 98.5 F (36.9 C) (Oral)   Ht 6\' 1"  (1.854 m)   Wt (!) 304 lb (137.9 kg)   BMI 40.11 kg/m   Wt Readings from Last 3 Encounters:  07/09/17 (!) 304 lb (137.9 kg)  05/24/17 (!) 303 lb (137.4 kg)  12/13/16 (!) 315 lb (142.9 kg)    Physical Exam  Constitutional: He is oriented to person,  place, and time. He appears well-developed and well-nourished. No distress.  HENT:  Right Ear: Tympanic membrane, external ear and ear canal normal.  Left Ear: Tympanic membrane, external ear and ear canal normal.  Nose: Mucosal edema and rhinorrhea present. No sinus tenderness. No epistaxis. Right sinus exhibits maxillary sinus tenderness. Right sinus exhibits no frontal sinus tenderness. Left sinus exhibits maxillary sinus tenderness. Left sinus exhibits no frontal sinus tenderness.  Mouth/Throat: Uvula is midline and mucous membranes are normal. Posterior oropharyngeal edema and posterior oropharyngeal erythema present. No oropharyngeal exudate or tonsillar abscesses.  Eyes: Pupils are equal, round, and reactive to light. Conjunctivae and EOM are normal. No scleral icterus.  Neck: Neck supple. No thyromegaly present.  Cardiovascular: Normal rate, regular rhythm, normal heart sounds and intact distal pulses.  No murmur heard. Pulmonary/Chest: Effort normal. No respiratory distress. He has wheezes (Faint wheeze audible on back but then cleared with coughing). He has no rales.  Musculoskeletal: Normal range of motion. He exhibits no edema.  Lymphadenopathy:    He has no cervical adenopathy.  Neurological: He is alert and oriented to person, place, and time. Coordination normal.  Skin: Skin is warm and dry. No rash noted. He is not diaphoretic.  Psychiatric: He has a normal mood and affect. His behavior is normal.  Nursing note and vitals reviewed.   Chest x-ray: No acute cardiopulmonary abnormalities, no change from previous x-ray    Assessment & Plan:   Problem List Items Addressed This Visit    None    Visit Diagnoses    Atypical pneumonia    -  Primary   Relevant Medications   doxycycline (VIBRA-TABS) 100 MG tablet   benzonatate (TESSALON) 100 MG capsule   predniSONE (DELTASONE) 20 MG tablet   Wheezing       Relevant Medications   doxycycline (VIBRA-TABS) 100 MG tablet    benzonatate (TESSALON) 100 MG capsule   predniSONE (DELTASONE) 20 MG tablet   Other Relevant Orders   DG Chest 2 View (Completed)       Follow up plan: Return if symptoms worsen or fail to improve.  Counseling provided for all of the vaccine components Orders Placed This Encounter  Procedures  . DG Chest 2 View    Caryl Pina, MD [facility] 07/09/2017, 8:39 AM

## 2017-07-19 ENCOUNTER — Other Ambulatory Visit: Payer: Self-pay | Admitting: Family Medicine

## 2017-07-25 ENCOUNTER — Encounter: Payer: Self-pay | Admitting: Cardiology

## 2017-08-07 NOTE — Progress Notes (Signed)
Cardiology Office Note   Date:  08/08/2017   ID:  Arthur Foster, DOB Aug 14, 1950, MRN [id number]  PCP:  Arthur Herb, MD  Cardiologist:   No primary care provider on file. Referring:  Arthur Herb, MD  No chief complaint on file.     History of Present Illness: Arthur Foster is a 67 y.o. male who presents referred by Arthur Herb, MD for evaluation of an abnormal EKG and multiple cardiovascular risk factors.  He has hypertension and some dyslipidemia.  He has obesity as stress test many years ago he said it was normal.  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  He works hard and is active all the time though he does not exercise specifically. The patient denies any new symptoms such as chest discomfort, neck or arm discomfort. There has been no new shortness of breath, PND or orthopnea. There have been no reported palpitations, presyncope or syncope.     Past Medical History:  Diagnosis Date  . Anxiety   . BPH (benign prostatic hyperplasia)   . Depression   . Hypertension   . Testosterone deficiency     Past Surgical History:  Procedure Laterality Date  . HERNIA REPAIR Left inguinal     Current Outpatient Medications  Medication Sig Dispense Refill  . ALPRAZolam (XANAX) 0.25 MG tablet TAKE 1 OR 2 TABLETS DAILY AS NEEDED 60 tablet 2  . ANDROGEL PUMP 20.25 MG/ACT (1.62%) GEL PLACE 4 PUMPS ONTO SKIN DAILY 150 g 2  . cetirizine (ZYRTEC) 10 MG tablet TAKE 1 TABLET DAILY 90 tablet 0  . escitalopram (LEXAPRO) 20 MG tablet Take 1 tablet (20 mg total) by mouth daily. 90 tablet 0  . lisinopril (PRINIVIL,ZESTRIL) 20 MG tablet Take 20 mg by mouth daily.    . sildenafil (REVATIO) 20 MG tablet Take 2-5 tablets as needed for sexual activity 50 tablet 6  . benzonatate (TESSALON) 100 MG capsule Take 1 capsule (100 mg total) by mouth 2 (two) times daily as needed for cough. 30 capsule 0  . diclofenac sodium (VOLTAREN) 1 % GEL Apply 2 g topically 4 (four)  times daily as needed. 100 g 3  . doxycycline (VIBRA-TABS) 100 MG tablet Take 1 tablet (100 mg total) by mouth 2 (two) times daily. 1 po bid 20 tablet 0  . mometasone (ELOCON) 0.1 % cream APPLY SPARINGLY TWICE A DAY FOR 7 TO 10 DAYS (Patient not taking: Reported on 08/08/2017) 45 g 1   No current facility-administered medications for this visit.     Allergies:   Ambien [zolpidem tartrate]    Social History:  The patient  reports that he quit smoking about 16 years ago. He has never used smokeless tobacco. He reports that he drinks alcohol. He reports that he does not use drugs.   Family History:  The patient's family history includes Alzheimer's disease in his father and mother; Hypertension in his father and mother.    ROS:  Please see the history of present illness.   Otherwise, review of systems are positive for none.   All other systems are reviewed and negative.    PHYSICAL EXAM: VS:  BP 140/78   Pulse 72   Ht 6' 1.5" (1.867 m)   Wt (!) 305 lb (138.3 kg)   BMI 39.69 kg/m  , BMI Body mass index is 39.69 kg/m. GENERAL:  Well appearing HEENT:  Pupils equal round and reactive, fundi not visualized, oral mucosa  unremarkable NECK:  No jugular venous distention, waveform within normal limits, carotid upstroke brisk and symmetric, no bruits, no thyromegaly LYMPHATICS:  No cervical, inguinal adenopathy LUNGS:  Clear to auscultation bilaterally BACK:  No CVA tenderness CHEST:  Unremarkable HEART:  PMI not displaced or sustained,S1 and S2 within normal limits, no S3, no S4, no clicks, no rubs, no murmurs ABD:  Flat, positive bowel sounds normal in frequency in pitch, no bruits, no rebound, no guarding, no midline pulsatile mass, no hepatomegaly, no splenomegaly EXT:  2 plus pulses throughout, no edema, no cyanosis no clubbing SKIN:  No rashes no nodules NEURO:  Cranial nerves II through XII grossly intact, motor grossly intact throughout PSYCH:  Cognitively intact, oriented to person  place and time    EKG:  EKG is not ordered today. The ekg ordered 05/24/17 demonstrates NSR, rate 66, RAD, no acute ST T wave changes   Recent Labs: 05/24/2017: ALT 26; BUN 15; Creatinine, Ser 1.06; Hemoglobin 17.2; Platelets 326; Potassium 4.6; Sodium 140    Lipid Panel    Component Value Date/Time   CHOL 182 05/24/2017 0911   TRIG 67 05/24/2017 0911   TRIG 129 12/19/2014 0806   HDL 46 05/24/2017 0911   HDL 48 12/19/2014 0806   CHOLHDL 4.0 05/24/2017 0911   LDLCALC 123 (H) 05/24/2017 0911   LDLCALC 142 (H) 12/24/2013 0815      Wt Readings from Last 3 Encounters:  08/08/17 (!) 305 lb (138.3 kg)  07/09/17 (!) 304 lb (137.9 kg)  05/24/17 (!) 303 lb (137.4 kg)      Other studies Reviewed: Additional studies/ records that were reviewed today include: Labs and PCP records. Review of the above records demonstrates:  Please see elsewhere in the note.     ASSESSMENT AND PLAN:  ABN IORMAL EKG:    At this point he has no symptoms.  His risk factors are somewhat mild and he has no family history.  He is very active and does not bring on any symptoms.  I do not think he needs any cardiovascular testing but he clearly needs primary risk reduction we talked about this.  His right axis deviation I do not think indicates pathology and is unchanged from 2016.  OBESITY:  The patient understands the need to lose weight with diet and exercise. We have discussed specific strategies for this.  HTN:  BP is borderline.  I think he needs to control this with weight loss.  DYSLIPIDEMIA: His LDL was 123 and his HDL 46 in February.  He needs to manage this with diet and we talked about this at great length.  I gave him specific instructions.   Current medicines are reviewed at length with the patient today.  The patient does not have concerns regarding medicines.  The following changes have been made:  no change  Labs/ tests ordered today include: no No orders of the defined types were placed  in this encounter.    Disposition:   FU with me as needed     Signed, Minus Breeding, MD  08/08/2017 9:56 AM    [facility]

## 2017-08-08 ENCOUNTER — Ambulatory Visit: Payer: Managed Care, Other (non HMO) | Admitting: Cardiology

## 2017-08-08 ENCOUNTER — Encounter: Payer: Self-pay | Admitting: Cardiology

## 2017-08-08 DIAGNOSIS — R9431 Abnormal electrocardiogram [ECG] [EKG]: Secondary | ICD-10-CM | POA: Diagnosis not present

## 2017-08-08 DIAGNOSIS — E785 Hyperlipidemia, unspecified: Secondary | ICD-10-CM

## 2017-08-08 DIAGNOSIS — E782 Mixed hyperlipidemia: Secondary | ICD-10-CM | POA: Insufficient documentation

## 2017-08-08 DIAGNOSIS — I10 Essential (primary) hypertension: Secondary | ICD-10-CM | POA: Diagnosis not present

## 2017-08-08 NOTE — Patient Instructions (Signed)
Medication Instructions:  The current medical regimen is effective;  continue present plan and medications.  Follow-Up: Follow up as needed with Dr Hochrein.  If you need a refill on your cardiac medications before your next appointment, please call your pharmacy.  Thank you for choosing [facility]!!       

## 2017-08-17 ENCOUNTER — Other Ambulatory Visit: Payer: Self-pay | Admitting: Family Medicine

## 2017-09-17 ENCOUNTER — Other Ambulatory Visit: Payer: Self-pay | Admitting: Nurse Practitioner

## 2017-09-27 ENCOUNTER — Telehealth: Payer: Self-pay | Admitting: Family Medicine

## 2017-09-27 MED ORDER — AMOXICILLIN-POT CLAVULANATE 875-125 MG PO TABS
1.0000 | ORAL_TABLET | Freq: Two times a day (BID) | ORAL | 0 refills | Status: DC
Start: 2017-09-27 — End: 2017-11-21

## 2017-09-27 NOTE — Telephone Encounter (Signed)
Has a sinus HA, drainage and HA also some pressure in right ear and he has - can antibiotic be called in? Been taking zyrtec. He is going out of [place]

## 2017-09-27 NOTE — Telephone Encounter (Signed)
Augmentin 875 #21 twice daily with food if patient is not allergic to penicillin

## 2017-09-27 NOTE — Telephone Encounter (Signed)
Pt aware med sent in

## 2017-10-16 ENCOUNTER — Other Ambulatory Visit: Payer: Self-pay | Admitting: Family Medicine

## 2017-10-16 ENCOUNTER — Telehealth: Payer: Self-pay | Admitting: Family Medicine

## 2017-10-16 MED ORDER — ESCITALOPRAM OXALATE 20 MG PO TABS: 20.0000 mg | ORAL_TABLET | Freq: Every day | ORAL | 0 refills | Status: DC

## 2017-10-16 NOTE — Telephone Encounter (Signed)
Rx sent to pharmacy per patients request. Patient notified

## 2017-11-14 ENCOUNTER — Other Ambulatory Visit: Payer: Self-pay | Admitting: Family Medicine

## 2017-11-14 NOTE — Telephone Encounter (Signed)
LAST SEEN 07/09/17  dwm

## 2017-11-21 ENCOUNTER — Encounter: Payer: Self-pay | Admitting: Family Medicine

## 2017-11-21 ENCOUNTER — Ambulatory Visit: Payer: Managed Care, Other (non HMO) | Admitting: Family Medicine

## 2017-11-21 VITALS — BP 146/89 | HR 71 | Temp 97.1°F | Ht 73.5 in | Wt 309.0 lb

## 2017-11-21 DIAGNOSIS — I10 Essential (primary) hypertension: Secondary | ICD-10-CM

## 2017-11-21 DIAGNOSIS — N400 Enlarged prostate without lower urinary tract symptoms: Secondary | ICD-10-CM

## 2017-11-21 DIAGNOSIS — E7800 Pure hypercholesterolemia, unspecified: Secondary | ICD-10-CM

## 2017-11-21 DIAGNOSIS — E349 Endocrine disorder, unspecified: Secondary | ICD-10-CM | POA: Diagnosis not present

## 2017-11-21 DIAGNOSIS — E559 Vitamin D deficiency, unspecified: Secondary | ICD-10-CM

## 2017-11-21 LAB — MICROSCOPIC EXAMINATION
Bacteria, UA: NONE SEEN
EPITHELIAL CELLS (NON RENAL): NONE SEEN /HPF (ref 0–10)
RENAL EPITHEL UA: NONE SEEN /HPF

## 2017-11-21 LAB — URINALYSIS, COMPLETE
Bilirubin, UA: NEGATIVE
Glucose, UA: NEGATIVE
KETONES UA: NEGATIVE
Leukocytes, UA: NEGATIVE
Nitrite, UA: NEGATIVE
RBC UA: NEGATIVE
SPEC GRAV UA: 1.02 (ref 1.005–1.030)
Urobilinogen, Ur: 1 mg/dL (ref 0.2–1.0)
pH, UA: 7.5 (ref 5.0–7.5)

## 2017-11-21 MED ORDER — LISINOPRIL 20 MG PO TABS: 20.0000 mg | ORAL_TABLET | Freq: Every day | ORAL | 3 refills | Status: DC

## 2017-11-21 MED ORDER — TESTOSTERONE 1.62 % TD GEL: 4.0000 | Freq: Every day | TRANSDERMAL | 4 refills | Status: DC

## 2017-11-21 MED ORDER — SILDENAFIL CITRATE 20 MG PO TABS: ORAL_TABLET | ORAL | 6 refills | Status: DC

## 2017-11-21 MED ORDER — ALPRAZOLAM 0.25 MG PO TABS: ORAL_TABLET | ORAL | 5 refills | Status: DC

## 2017-11-21 MED ORDER — ESCITALOPRAM OXALATE 20 MG PO TABS: 20.0000 mg | ORAL_TABLET | Freq: Every day | ORAL | 3 refills | Status: DC

## 2017-11-21 NOTE — Progress Notes (Signed)
Subjective:    Patient ID: Arthur Foster, male    DOB: Apr 23, 1950, 67 y.o.   MRN: [id number]  HPI Pt here for follow up and management of chronic medical problems which includes hypertension and hyperlipidemia. He is taking medication regularly.  The patient is doing well overall and his usual has no complaints.  He is requesting refills on his medicine and the blood pressure on 2 different occasions was elevated both systolic and diastolic.  The family history is positive for Alzheimer's disease in both parents.  The patient is currently taking lisinopril 20 mg daily.  He is also on testosterone replacement.  All of his prescriptions will be signed and refilled today.  Patient recently had an eye exam in July.  He plans to get his eyes checked every 2 years.  Today he denies any chest pain pressure tightness or shortness of breath.  He denies any trouble with his stomach as far as nausea vomiting diarrhea blood in the stool or black tarry bowel movements.  He is passing his water without problems.  He has erectile dysfunction and likes using the AndroGel but understands when he reaches Medicare age that this may not be covered by Medicare.  He will get lab work today.  He did not take his blood pressure medicine this morning and he will come back by the office in a couple weeks when he is taken his medicine for the blood pressure to be rechecked.  He says typically his blood pressure at home runs in the 130s over the 70s.  He is thinking about cutting back more on working to the point where he can retire completely.  He is an Clinical biochemist.    Patient Active Problem List   Diagnosis Date Noted  . Dyslipidemia 08/08/2017  . Abnormal EKG 08/08/2017  . Cervical somatic dysfunction 03/06/2016  . 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 Generalized anxiety disorder 03/06/2016  . Left knee pain 11/05/2014  . Allergic rhinitis 06/18/2013  . Severe obesity (BMI >= 40) ([place]) 06/18/2013  . BPH (benign prostatic  hypertrophy) 07/03/2012  . Essential hypertension, benign 07/03/2012  . Depression 07/03/2012  . GERD (gastroesophageal reflux disease) 07/03/2012  . Testosterone deficiency 07/03/2012   Outpatient Encounter Medications as of 11/21/2017  Medication Sig  . ALPRAZolam (XANAX) 0.25 MG tablet TAKE 1 OR 2 TABLETS DAILY AS NEEDED  . cetirizine (ZYRTEC) 10 MG tablet TAKE 1 TABLET DAILY  . diclofenac sodium (VOLTAREN) 1 % GEL Apply 2 g topically 4 (four) times daily as needed.  [name] escitalopram (LEXAPRO) 20 MG tablet Take 1 tablet (20 mg total) by mouth daily.  [name] lisinopril (PRINIVIL,ZESTRIL) 20 MG tablet Take 20 mg by mouth daily.  . mometasone (ELOCON) 0.1 % cream APPLY SPARINGLY TWICE A DAY FOR 7 TO 10 DAYS  . sildenafil (REVATIO) 20 MG tablet Take 2-5 tablets as needed for sexual activity  . Testosterone 1.62 % GEL PLACE 4 PUMPS ONTO SKIN DAILY  . [DISCONTINUED] amoxicillin-clavulanate (AUGMENTIN) 875-125 MG tablet Take 1 tablet by mouth 2 (two) times daily.   No facility-administered encounter medications on file as of 11/21/2017.       Review of Systems  Constitutional: Negative.   HENT: Negative.   Eyes: Negative.   Respiratory: Negative.   Cardiovascular: Negative.   Gastrointestinal: Negative.   Endocrine: Negative.   Genitourinary: Negative.   Musculoskeletal: Negative.   Skin: Negative.   Allergic/Immunologic: Negative.   Neurological: Negative.   Hematological: Negative.   Psychiatric/Behavioral: Negative.  Objective:   Physical Exam  Constitutional: He is oriented to person, place, and time. He appears well-developed and well-nourished. No distress.  Pleasant and kind and alert  HENT:  Head: Normocephalic and atraumatic.  Right Ear: External ear normal.  Left Ear: External ear normal.  Nose: Nose normal.  Mouth/Throat: Oropharynx is clear and moist. No oropharyngeal exudate.  Eyes: Pupils are equal, round, and reactive to light. Conjunctivae and EOM are  normal. Right eye exhibits no discharge. Left eye exhibits no discharge. No scleral icterus.  Recent eye exam  Neck: Normal range of motion. Neck supple. No tracheal deviation present. No thyromegaly present.  No bruits thyromegaly or anterior cervical adenopathy  Cardiovascular: Normal rate, regular rhythm, normal heart sounds and intact distal pulses.  No murmur heard. Heart is regular at 72/min with good pedal pulses  Pulmonary/Chest: Effort normal and breath sounds normal. He has no wheezes. He has no rales. He exhibits no tenderness.  Clear anteriorly and posteriorly and no axillary adenopathy.  No chest wall masses.  Abdominal: Soft. Bowel sounds are normal. He exhibits no distension and no mass. There is no tenderness. There is no rebound and no guarding.  [place]ino obesity without liver or spleen enlargement epigastric tenderness inguinal adenopathy masses or bruits  Genitourinary: Rectum normal, prostate normal and penis normal.  Genitourinary Comments: The prostate is enlarged but soft and smooth.  There were no rectal masses.  The external genitalia were within normal limits with no masses or inguinal adenopathy.  Musculoskeletal: Normal range of motion. He exhibits no edema or tenderness.  Lymphadenopathy:    He has no cervical adenopathy.  Neurological: He is alert and oriented to person, place, and time. He has normal reflexes. No cranial nerve deficit.  Is 1+ and equal bilaterally  Skin: Skin is warm and dry. No rash noted.  Psychiatric: He has a normal mood and affect. His behavior is normal. Judgment and thought content normal.  The patient's mood affect and behavior are normal.  Nursing note and vitals reviewed.   BP (!) 148/85 (BP Location: Left Arm)   Pulse 71   Temp (!) 97.1 F (36.2 C) (Oral)   Ht 6' 1.5" (1.867 m)   Wt (!) 309 lb (140.2 kg)   BMI 40.21 kg/m        Assessment & Plan:  1. Pure hypercholesterolemia -Continue with efforts at weight loss and  alcohol restriction and  with as aggressive therapeutic lifestyle changes as possible. - CBC with Differential/Platelet - Lipid panel  2. Essential hypertension -Pressure is elevated today on the systolic side.  He says that his home readings typically run in the 130s over the 70s.  He did not take his medicine today.  He will try to reduce his Gatorade intake.  He will come back by the office in a couple weeks after he is taking his medicine and have the nurse recheck his blood pressure. - CBC with Differential/Platelet - BMP8+EGFR - Hepatic function panel  3. Vitamin D deficiency -Continue vitamin D replacement pending results of lab work - CBC with Differential/Platelet - VITAMIN D 25 Hydroxy (Vit-D Deficiency, Fractures)  4. Hypotestosteronemia -10 you with as aggressive therapeutic lifestyle changes as possible - CBC with Differential/Platelet - Urinalysis, Complete - PSA, total and free - Testosterone,Free and Total  5. Benign prostatic hyperplasia without lower urinary tract symptoms -Continue with testosterone replacement for erectile dysfunction and prostate exam was normal today.  Other than being enlarged slightly. - CBC with Differential/Platelet -  Urinalysis, Complete - PSA, total and free - Testosterone,Free and Total  Meds ordered this encounter  Medications  . escitalopram (LEXAPRO) 20 MG tablet    Sig: Take 1 tablet (20 mg total) by mouth daily.    Dispense:  90 tablet    Refill:  3  . lisinopril (PRINIVIL,ZESTRIL) 20 MG tablet    Sig: Take 1 tablet (20 mg total) by mouth daily.    Dispense:  90 tablet    Refill:  3  . sildenafil (REVATIO) 20 MG tablet    Sig: Take 2-5 tablets as needed for sexual activity    Dispense:  50 tablet    Refill:  6  . DISCONTD: Testosterone 1.62 % GEL    Sig: Apply 4 Pump topically daily.    Dispense:  150 g    Refill:  4  . DISCONTD: ALPRAZolam (XANAX) 0.25 MG tablet    Sig: TAKE 1 OR 2 TABLETS DAILY AS NEEDED     Dispense:  60 tablet    Refill:  5  . ALPRAZolam (XANAX) 0.25 MG tablet    Sig: TAKE 1 OR 2 TABLETS DAILY AS NEEDED    Dispense:  60 tablet    Refill:  5  . Testosterone 1.62 % GEL    Sig: Apply 4 Pump topically daily.    Dispense:  150 g    Refill:  4   Patient Instructions  Continue current medications. Continue good therapeutic lifestyle changes which include good diet and exercise. Fall precautions discussed with patient. If an FOBT was given today- please return it to our front desk. If you are over 45 years old - you may need Prevnar 16 or the adult Pneumonia vaccine.  **Flu shots are available--- please call and schedule a FLU-CLINIC appointment**  After your visit with [place] today you will receive a survey in the mail or online from [employer] regarding your care with [place]. Please take a moment to fill this out. Your feedback is very important to [place] as you can help [place] better understand your patient needs as well as improve your experience and satisfaction. WE CARE ABOUT YOU!!!   Please return to the office in a couple weeks after taking your blood pressure medicine and have the nurse recheck the blood pressure here to make sure we do not need to increase her medication anymore Omron is a good blood pressure monitor Stop the Gatorade and drink more water Try to reduce the beer and take Reduce sodium in the diet as much as possible Drink plenty of water  Arrie Senate MD

## 2017-11-21 NOTE — Patient Instructions (Addendum)
Continue current medications. Continue good therapeutic lifestyle changes which include good diet and exercise. Fall precautions discussed with patient. If an FOBT was given today- please return it to our front desk. If you are over 65 years old - you may need Prevnar 71 or the adult Pneumonia vaccine.  **Flu shots are available--- please call and schedule a FLU-CLINIC appointment**  After your visit with [place] today you will receive a survey in the mail or online from [employer] regarding your care with [place]. Please take a moment to fill this out. Your feedback is very important to [place] as you can help [place] better understand your patient needs as well as improve your experience and satisfaction. WE CARE ABOUT YOU!!!   Please return to the office in a couple weeks after taking your blood pressure medicine and have the nurse recheck the blood pressure here to make sure we do not need to increase her medication anymore Omron is a good blood pressure monitor Stop the Gatorade and drink more water Try to reduce the beer and take Reduce sodium in the diet as much as possible Drink plenty of water

## 2017-11-22 LAB — HEPATIC FUNCTION PANEL
ALT: 28 IU/L (ref 0–44)
AST: 22 IU/L (ref 0–40)
Albumin: 4.6 g/dL (ref 3.6–4.8)
Alkaline Phosphatase: 58 IU/L (ref 39–117)
BILIRUBIN, DIRECT: 0.32 mg/dL (ref 0.00–0.40)
Bilirubin Total: 1.3 mg/dL — ABNORMAL HIGH (ref 0.0–1.2)
TOTAL PROTEIN: 7.3 g/dL (ref 6.0–8.5)

## 2017-11-22 LAB — BMP8+EGFR
BUN/Creatinine Ratio: 12 (ref 10–24)
BUN: 14 mg/dL (ref 8–27)
CO2: 24 mmol/L (ref 20–29)
Calcium: 9.9 mg/dL (ref 8.6–10.2)
Chloride: 100 mmol/L (ref 96–106)
Creatinine, Ser: 1.14 mg/dL (ref 0.76–1.27)
GFR calc Af Amer: 78 mL/min/{1.73_m2} (ref >59)
GFR calc non Af Amer: 68 mL/min/{1.73_m2} (ref >59)
Glucose: 107 mg/dL — ABNORMAL HIGH (ref 65–99)
Potassium: 5.1 mmol/L (ref 3.5–5.2)
Sodium: 141 mmol/L (ref 134–144)

## 2017-11-22 LAB — CBC WITH DIFFERENTIAL/PLATELET
BASOS ABS: 0 10*3/uL (ref 0.0–0.2)
BASOS: 0 %
EOS (ABSOLUTE): 0.3 10*3/uL (ref 0.0–0.4)
Eos: 4 %
HEMOGLOBIN: 17.7 g/dL (ref 13.0–17.7)
Hematocrit: 54.3 % — ABNORMAL HIGH (ref 37.5–51.0)
IMMATURE GRANS (ABS): 0 10*3/uL (ref 0.0–0.1)
Immature Granulocytes: 0 %
LYMPHS ABS: 1.8 10*3/uL (ref 0.7–3.1)
LYMPHS: 26 %
MCH: 30.8 pg (ref 26.6–33.0)
MCHC: 32.6 g/dL (ref 31.5–35.7)
MCV: 95 fL (ref 79–97)
MONOCYTES: 8 %
Monocytes Absolute: 0.6 10*3/uL (ref 0.1–0.9)
NEUTROS ABS: 4.2 10*3/uL (ref 1.4–7.0)
Neutrophils: 62 %
Platelets: 315 10*3/uL (ref 150–450)
RBC: 5.74 x10E6/uL (ref 4.14–5.80)
RDW: 13.3 % (ref 12.3–15.4)
WBC: 6.9 10*3/uL (ref 3.4–10.8)

## 2017-11-22 LAB — LIPID PANEL
Chol/HDL Ratio: 4.4 ratio (ref 0.0–5.0)
Cholesterol, Total: 219 mg/dL — ABNORMAL HIGH (ref 100–199)
HDL: 50 mg/dL (ref >39)
LDL Calculated: 137 mg/dL — ABNORMAL HIGH (ref 0–99)
Triglycerides: 159 mg/dL — ABNORMAL HIGH (ref 0–149)
VLDL CHOLESTEROL CAL: 32 mg/dL (ref 5–40)

## 2017-11-22 LAB — PSA, TOTAL AND FREE
PSA FREE PCT: 16.9 %
PSA, Free: 0.22 ng/mL
Prostate Specific Ag, Serum: 1.3 ng/mL (ref 0.0–4.0)

## 2017-11-22 LAB — TESTOSTERONE,FREE AND TOTAL
Testosterone, Free: 8 pg/mL (ref 6.6–18.1)
Testosterone: 268 ng/dL (ref 264–916)

## 2017-11-22 LAB — VITAMIN D 25 HYDROXY (VIT D DEFICIENCY, FRACTURES): VIT D 25 HYDROXY: 38.2 ng/mL (ref 30.0–100.0)

## 2017-12-06 ENCOUNTER — Telehealth: Payer: Self-pay | Admitting: Family Medicine

## 2017-12-06 NOTE — Telephone Encounter (Signed)
Ok, thanks.

## 2017-12-06 NOTE — Telephone Encounter (Signed)
That is a good blood pressure reading.  Please have him still drop by at his convenience for my nurse to check his blood pressure here.  Make sure that he takes his medicine regularly and daily.

## 2018-01-11 DIAGNOSIS — Z23 Encounter for immunization: Secondary | ICD-10-CM | POA: Diagnosis not present

## 2018-03-09 ENCOUNTER — Other Ambulatory Visit: Payer: Self-pay | Admitting: Physician Assistant

## 2018-04-01 ENCOUNTER — Encounter: Payer: Self-pay | Admitting: Family Medicine

## 2018-04-01 ENCOUNTER — Ambulatory Visit (INDEPENDENT_AMBULATORY_CARE_PROVIDER_SITE_OTHER): Payer: Medicare Other | Admitting: Family Medicine

## 2018-04-01 VITALS — BP 141/75 | HR 86 | Temp 97.9°F | Ht 73.5 in | Wt 311.0 lb

## 2018-04-01 DIAGNOSIS — J329 Chronic sinusitis, unspecified: Secondary | ICD-10-CM

## 2018-04-01 DIAGNOSIS — I10 Essential (primary) hypertension: Secondary | ICD-10-CM

## 2018-04-01 DIAGNOSIS — R05 Cough: Secondary | ICD-10-CM

## 2018-04-01 DIAGNOSIS — R059 Cough, unspecified: Secondary | ICD-10-CM

## 2018-04-01 MED ORDER — CEFDINIR 300 MG PO CAPS: 300.0000 mg | ORAL_CAPSULE | Freq: Two times a day (BID) | ORAL | 0 refills | Status: DC

## 2018-04-01 NOTE — Patient Instructions (Addendum)
Use Mucinex, blue and white in color, 1 twice daily with a large glass of water for cough and congestion Take antibiotic as directed twice daily with food Use nasal saline 1 spray each nostril 3 or 4 times or more daily Take Tylenol for aches pains and fever Watch sodium intake and make every effort to lose weight and check blood pressures if possible outside the office If any worsening in condition and not improving after a week get back in touch with [place] Continue with Nasacort as currently doing

## 2018-04-01 NOTE — Addendum Note (Signed)
Addended by: Zannie Cove on: 04/01/2018 09:10 AM   Modules accepted: Orders

## 2018-04-01 NOTE — Progress Notes (Signed)
Subjective:    Patient ID: Arthur Foster, male    DOB: May 21, 1950, 67 y.o.   MRN: [id number]  HPI Patient here today for cough and congestion, that started on Friday.  Patient complains of cough congestion with nasal drainage and no fever that started 3 days ago.  His vital signs are stable blood pressure is slightly elevated around 140.  Patient complains of head congestion drainage green in color some headache and coughing up some purulent sputum and his right ear is also feeling stopped up.  He also has some soreness in his throat.   Patient Active Problem List   Diagnosis Date Noted  . Dyslipidemia 08/08/2017  . Abnormal EKG 08/08/2017  . Cervical somatic dysfunction 03/06/2016  . 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 Generalized anxiety disorder 03/06/2016  . Left knee pain 11/05/2014  . Allergic rhinitis 06/18/2013  . Severe obesity (BMI >= 40) ([place]) 06/18/2013  . BPH (benign prostatic hypertrophy) 07/03/2012  . Essential hypertension, benign 07/03/2012  . Depression 07/03/2012  . GERD (gastroesophageal reflux disease) 07/03/2012  . Testosterone deficiency 07/03/2012   Outpatient Encounter Medications as of 04/01/2018  Medication Sig  . ALPRAZolam (XANAX) 0.25 MG tablet TAKE 1 OR 2 TABLETS DAILY AS NEEDED  . cetirizine (ZYRTEC) 10 MG tablet TAKE 1 TABLET DAILY  . diclofenac sodium (VOLTAREN) 1 % GEL Apply 2 g topically 4 (four) times daily as needed.  [name] escitalopram (LEXAPRO) 20 MG tablet Take 1 tablet (20 mg total) by mouth daily.  [name] lisinopril (PRINIVIL,ZESTRIL) 20 MG tablet Take 1 tablet (20 mg total) by mouth daily.  . mometasone (ELOCON) 0.1 % cream APPLY SPARINGLY TWICE A DAY FOR 7 TO 10 DAYS  . [DISCONTINUED] sildenafil (REVATIO) 20 MG tablet Take 2-5 tablets as needed for sexual activity  . [DISCONTINUED] Testosterone 1.62 % GEL Apply 4 Pump topically daily.   No facility-administered encounter medications on file as of 04/01/2018.       Review of Systems    Constitutional: Negative.  Negative for fever.  HENT: Positive for congestion, postnasal drip and sinus pressure.   Eyes: Negative.   Respiratory: Positive for cough.   Cardiovascular: Negative.   Gastrointestinal: Negative.   Endocrine: Negative.   Genitourinary: Negative.   Musculoskeletal: Negative.   Skin: Negative.   Allergic/Immunologic: Negative.   Neurological: Negative.   Hematological: Negative.   Psychiatric/Behavioral: Negative.        Objective:   Physical Exam Vitals signs and nursing note reviewed.  Constitutional:      Appearance: Normal appearance. He is well-developed. He is obese. He is not ill-appearing.     Comments: Malaise and increased coughing and congestion  HENT:     Head: Normocephalic and atraumatic.     Right Ear: Ear canal and external ear normal. There is no impacted cerumen.     Left Ear: Tympanic membrane, ear canal and external ear normal.     Ears:     Comments: Right TM slightly retracted but normal in color    Nose: Congestion and rhinorrhea present.     Comments: Nasal turbinate congestion bilaterally    Mouth/Throat:     Mouth: Mucous membranes are moist.     Pharynx: Posterior oropharyngeal erythema present. No oropharyngeal exudate.  Eyes:     General: No scleral icterus.       Right eye: No discharge.        Left eye: No discharge.     Conjunctiva/sclera: Conjunctivae normal.     Pupils:  Pupils are equal, round, and reactive to light.  Neck:     Musculoskeletal: Normal range of motion and neck supple. Muscular tenderness present.     Thyroid: No thyromegaly.     Vascular: No carotid bruit.     Trachea: No tracheal deviation.  Cardiovascular:     Rate and Rhythm: Normal rate and regular rhythm.     Heart sounds: Normal heart sounds. No murmur.     Comments: The heart is regular at 84/min Pulmonary:     Effort: Pulmonary effort is normal. No respiratory distress.     Breath sounds: Normal breath sounds. No wheezing or  rales.     Comments: Minimal congestion with coughing and no rales or rhonchi Chest:     Chest wall: No tenderness.  Abdominal:     General: Bowel sounds are normal.     Palpations: Abdomen is soft. There is no mass.     Tenderness: There is no abdominal tenderness. There is no guarding.  Musculoskeletal: Normal range of motion.  Lymphadenopathy:     Cervical: No cervical adenopathy.  Skin:    General: Skin is warm and dry.     Findings: No rash.  Neurological:     Mental Status: He is alert and oriented to person, place, and time. Mental status is at baseline.     Cranial Nerves: No cranial nerve deficit.     Deep Tendon Reflexes: Reflexes are normal and symmetric.  Psychiatric:        Mood and Affect: Mood normal.        Behavior: Behavior normal.        Thought Content: Thought content normal.        Judgment: Judgment normal.     Comments: Mood affect and behavior are normal for this patient           Assessment & Plan:  .1. Rhinosinusitis -Use nasal saline frequently and take Omnicef 300 mg twice daily with food for 10 days -Take Tylenol for aches pains and fever  2. Cough -Take Mucinex maximum strength, plain, blue and white in color, 1 twice daily with a large glass of water for cough and congestion  3. Essential hypertension -Watch sodium intake and check blood pressures regularly on the outside and if possible drop by the office and 3 to 4 weeks for recheck in the office by the nurse.  No orders of the defined types were placed in this encounter.  Patient Instructions  Use Mucinex, blue and white in color, 1 twice daily with a large glass of water for cough and congestion Take antibiotic as directed twice daily with food Use nasal saline 1 spray each nostril 3 or 4 times or more daily Take Tylenol for aches pains and fever Watch sodium intake and make every effort to lose weight and check blood pressures if possible outside the office If any worsening in  condition and not improving after a week get back in touch with [place] Continue with Nasacort as currently doing  Arrie Senate MD

## 2018-05-24 ENCOUNTER — Ambulatory Visit: Payer: Managed Care, Other (non HMO) | Admitting: Family Medicine

## 2018-05-24 ENCOUNTER — Encounter (INDEPENDENT_AMBULATORY_CARE_PROVIDER_SITE_OTHER): Payer: Self-pay

## 2018-05-24 ENCOUNTER — Encounter: Payer: Self-pay | Admitting: Family Medicine

## 2018-05-24 ENCOUNTER — Ambulatory Visit (INDEPENDENT_AMBULATORY_CARE_PROVIDER_SITE_OTHER): Payer: Medicare Other | Admitting: Family Medicine

## 2018-05-24 VITALS — BP 138/80 | HR 78 | Temp 97.2°F | Ht 73.5 in | Wt 319.0 lb

## 2018-05-24 DIAGNOSIS — R6882 Decreased libido: Secondary | ICD-10-CM | POA: Diagnosis not present

## 2018-05-24 DIAGNOSIS — Z1211 Encounter for screening for malignant neoplasm of colon: Secondary | ICD-10-CM

## 2018-05-24 DIAGNOSIS — E7800 Pure hypercholesterolemia, unspecified: Secondary | ICD-10-CM

## 2018-05-24 DIAGNOSIS — N529 Male erectile dysfunction, unspecified: Secondary | ICD-10-CM | POA: Diagnosis not present

## 2018-05-24 DIAGNOSIS — R635 Abnormal weight gain: Secondary | ICD-10-CM | POA: Diagnosis not present

## 2018-05-24 DIAGNOSIS — L989 Disorder of the skin and subcutaneous tissue, unspecified: Secondary | ICD-10-CM | POA: Diagnosis not present

## 2018-05-24 DIAGNOSIS — I10 Essential (primary) hypertension: Secondary | ICD-10-CM

## 2018-05-24 DIAGNOSIS — E349 Endocrine disorder, unspecified: Secondary | ICD-10-CM

## 2018-05-24 DIAGNOSIS — Q181 Preauricular sinus and cyst: Secondary | ICD-10-CM | POA: Diagnosis not present

## 2018-05-24 DIAGNOSIS — E559 Vitamin D deficiency, unspecified: Secondary | ICD-10-CM

## 2018-05-24 DIAGNOSIS — L853 Xerosis cutis: Secondary | ICD-10-CM | POA: Diagnosis not present

## 2018-05-24 DIAGNOSIS — R3915 Urgency of urination: Secondary | ICD-10-CM

## 2018-05-24 NOTE — Patient Instructions (Addendum)
Medicare Annual Wellness Visit  [facility] and the medical providers at [facility] strive to bring you the best medical care.  In doing so we not only want to address your current medical conditions and concerns but also to detect new conditions early and prevent illness, disease and health-related problems.    Medicare offers a yearly Wellness Visit which allows our clinical staff to assess your need for preventative services including immunizations, lifestyle education, counseling to decrease risk of preventable diseases and screening for fall risk and other medical concerns.    This visit is provided free of charge (no copay) for all Medicare recipients. The clinical pharmacists at [facility] have begun to conduct these Wellness Visits which will also include a thorough review of all your medications.    As you primary medical provider recommend that you make an appointment for your Annual Wellness Visit if you have not done so already this year.  You may set up this appointment before you leave today or you may call back ([phone]) and schedule an appointment.  Please make sure when you call that you mention that you are scheduling your Annual Wellness Visit with the clinical pharmacist so that the appointment may be made for the proper length of time.     Continue current medications. Continue good therapeutic lifestyle changes which include good diet and exercise. Fall precautions discussed with patient. If an FOBT was given today- please return it to our front desk. If you are over 32 years old - you may need Prevnar 34 or the adult Pneumonia vaccine.  **Flu shots are available--- please call and schedule a FLU-CLINIC appointment**  After your visit with [place] today you will receive a survey in the mail or online from [employer] regarding your care with [place]. Please take a moment to fill this out. Your feedback is very  important to [place] as you can help [place] better understand your patient needs as well as improve your experience and satisfaction. WE CARE ABOUT YOU!!!   The patient should continue to make all efforts to lose weight and get more physical activity Drink more water and less drinks Eat less bread and watch cholesterol in the diet more closely Keep appointment with dermatology We will arrange for you to go see the gastroenterologist to have your colonoscopy if it is past due. Watch sodium intake Use a coolmist humidifier Make sure that soaps fabric softeners and detergents or scent free

## 2018-05-24 NOTE — Progress Notes (Signed)
Subjective:    Patient ID: Arthur Foster, male    DOB: 06/22/1950, 68 y.o.   MRN: [id number]  HPI Pt here for follow up and management of chronic medical problems which includes hypertension and hyperlipidemia. He is taking medication regularly.  The patient is doing well overall but does complain with dry skin.  He is given an FOBT to return and will get lab work today.  He has a dermatology appointment next week and also has a place on his nose that needs to be looked at.  The patient is pleasant and alert and has not been getting a lot of physical activity this winter and has been sitting in the house more and has gained 8 pounds of weight.  He is has some stress on his plate also.  We had a long talk about weight and blood pressure and eating habits and he is going going to try to do better.  He is complaining of itching all over he has very dry skin and as mentioned earlier has a couple areas on his nose for which he needs to see the dermatologist about.  He says that he is not using any fabric softeners soaps that are scented or detergents that are scented.  He will try to drink more water and keep the house cooler.  He denies any chest pain pressure tightness or shortness of breath.  He denies any trouble with his stomach any more than usual with no change in bowel habits.  He appears to be past due on getting his colonoscopy.  We will go ahead and schedule him an appointment for follow-up to get this done.  He is passing his water well although has urgency.  The patient is pleasant and doing well overall.     Patient Active Problem List   Diagnosis Date Noted  . Dyslipidemia 08/08/2017  . Abnormal EKG 08/08/2017  . Cervical somatic dysfunction 03/06/2016  . 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 Generalized anxiety disorder 03/06/2016  . Left knee pain 11/05/2014  . Allergic rhinitis 06/18/2013  . Severe obesity (BMI >= 40) ([place]) 06/18/2013  . BPH (benign prostatic hypertrophy) 07/03/2012    . Essential hypertension, benign 07/03/2012  . Depression 07/03/2012  . GERD (gastroesophageal reflux disease) 07/03/2012  . Testosterone deficiency 07/03/2012   Outpatient Encounter Medications as of 05/24/2018  Medication Sig  . ALPRAZolam (XANAX) 0.25 MG tablet TAKE 1 OR 2 TABLETS DAILY AS NEEDED  . cetirizine (ZYRTEC) 10 MG tablet TAKE 1 TABLET DAILY  . escitalopram (LEXAPRO) 20 MG tablet Take 1 tablet (20 mg total) by mouth daily.  [name] lisinopril (PRINIVIL,ZESTRIL) 20 MG tablet Take 1 tablet (20 mg total) by mouth daily.  . mometasone (ELOCON) 0.1 % cream APPLY SPARINGLY TWICE A DAY FOR 7 TO 10 DAYS  . [DISCONTINUED] cefdinir (OMNICEF) 300 MG capsule Take 1 capsule (300 mg total) by mouth 2 (two) times daily. 1 po BID  . [DISCONTINUED] diclofenac sodium (VOLTAREN) 1 % GEL Apply 2 g topically 4 (four) times daily as needed.   No facility-administered encounter medications on file as of 05/24/2018.      Review of Systems  Constitutional: Negative.   HENT: Negative.   Eyes: Negative.   Respiratory: Negative.   Cardiovascular: Negative.   Gastrointestinal: Negative.   Endocrine: Negative.   Genitourinary: Negative.   Musculoskeletal: Negative.   Skin: Negative.        Dry skin / itching   Allergic/Immunologic: Negative.   Neurological: Negative.  Hematological: Negative.   Psychiatric/Behavioral: Negative.        Objective:   Physical Exam Vitals signs and nursing note reviewed.  Constitutional:      General: He is not in acute distress.    Appearance: He is well-developed. He is obese.     Comments: The patient is pleasant and alert with no acute issues.  HENT:     Head: Normocephalic and atraumatic.     Right Ear: Tympanic membrane, ear canal and external ear normal. There is no impacted cerumen.     Left Ear: Tympanic membrane and external ear normal. There is no impacted cerumen.     Ears:     Comments: The left ear canal has a circular small cyst just external to  the TM on that side.  Never seen this before on this patient.  We will do a referral to ear nose and throat to further evaluate this.    Nose: Congestion present.     Comments: Increased turbinate congestion and swelling    Mouth/Throat:     Mouth: Mucous membranes are moist.     Pharynx: Oropharynx is clear. No oropharyngeal exudate.  Eyes:     General: No scleral icterus.       Right eye: No discharge.        Left eye: No discharge.     Extraocular Movements: Extraocular movements intact.     Conjunctiva/sclera: Conjunctivae normal.     Pupils: Pupils are equal, round, and reactive to light.     Comments: Up-to-date on eye exams  Neck:     Musculoskeletal: Normal range of motion and neck supple.     Thyroid: No thyromegaly.     Vascular: No carotid bruit.     Trachea: No tracheal deviation.     Comments: No bruits thyromegaly or anterior cervical adenopathy Cardiovascular:     Rate and Rhythm: Normal rate and regular rhythm.     Heart sounds: Normal heart sounds. No murmur.     Comments: The heart is regular at 72/min with no pedal edema and good pedal pulses Pulmonary:     Effort: Pulmonary effort is normal. No respiratory distress.     Breath sounds: Normal breath sounds. No wheezing or rales.     Comments: Clear anteriorly and posteriorly no chest wall masses or tenderness Chest:     Chest wall: No tenderness.  Abdominal:     General: Bowel sounds are normal.     Palpations: Abdomen is soft. There is no mass.     Tenderness: There is no abdominal tenderness.     Comments: Obese, without masses tenderness organ enlargement or bruits  Musculoskeletal: Normal range of motion.        General: No tenderness.     Right lower leg: No edema.     Left lower leg: No edema.  Lymphadenopathy:     Cervical: No cervical adenopathy.  Skin:    General: Skin is warm and dry.     Findings: No rash.     Comments: Dry skin in general  Neurological:     General: No focal deficit  present.     Mental Status: He is alert and oriented to person, place, and time. Mental status is at baseline.     Cranial Nerves: No cranial nerve deficit.     Gait: Gait normal.     Deep Tendon Reflexes: Reflexes are normal and symmetric. Reflexes normal.  Psychiatric:  Mood and Affect: Mood normal.        Behavior: Behavior normal.        Thought Content: Thought content normal.        Judgment: Judgment normal.    BP (!) 145/75 (BP Location: Left Arm)   Pulse 78   Temp (!) 97.2 F (36.2 C) (Oral)   Ht 6' 1.5" (1.867 m)   Wt (!) 319 lb (144.7 kg)   BMI 41.52 kg/m         Assessment & Plan:  1. Essential hypertension -Blood pressure was elevated this morning, patient has gained weight, on the third reading it was 138/80 with a large cuff taken manually in the right arm with the patient sitting.  He will continue to make efforts to watch sodium intake and lose weight. - BMP8+EGFR - CBC with Differential/Platelet - Hepatic function panel  2. Pure hypercholesterolemia -Watch diet more closely and eat more healthy foods and drink more water - CBC with Differential/Platelet - Lipid panel  3. Vitamin D deficiency -Continue current treatment pending results of lab work - CBC with Differential/Platelet - VITAMIN D 25 Hydroxy (Vit-D Deficiency, Fractures)  4. Urinary urgency -Check urine  5. Morbid obesity ([place]) -Work aggressively on weight loss with diet and exercise  6. Skin lesion of face -Follow-up with dermatologist as planned  7. Dry skin dermatitis -Discussed with dermatology  8. Weight gain -Aggressive therapeutic lifestyle changes  9. Cyst of ear canal - Ambulatory referral to ENT  10. Erectile dysfunction, unspecified erectile dysfunction type -Check testosterone level  11. Testosterone deficiency - Testosterone,Free and Total  No orders of the defined types were placed in this encounter.  Patient Instructions                        Medicare Annual Wellness Visit  [facility] and the medical providers at [facility] strive to bring you the best medical care.  In doing so we not only want to address your current medical conditions and concerns but also to detect new conditions early and prevent illness, disease and health-related problems.    Medicare offers a yearly Wellness Visit which allows our clinical staff to assess your need for preventative services including immunizations, lifestyle education, counseling to decrease risk of preventable diseases and screening for fall risk and other medical concerns.    This visit is provided free of charge (no copay) for all Medicare recipients. The clinical pharmacists at [facility] have begun to conduct these Wellness Visits which will also include a thorough review of all your medications.    As you primary medical provider recommend that you make an appointment for your Annual Wellness Visit if you have not done so already this year.  You may set up this appointment before you leave today or you may call back ([phone]) and schedule an appointment.  Please make sure when you call that you mention that you are scheduling your Annual Wellness Visit with the clinical pharmacist so that the appointment may be made for the proper length of time.     Continue current medications. Continue good therapeutic lifestyle changes which include good diet and exercise. Fall precautions discussed with patient. If an FOBT was given today- please return it to our front desk. If you are over 46 years old - you may need Prevnar 58 or the adult Pneumonia vaccine.  **Flu shots are available--- please call and schedule a FLU-CLINIC  appointment**  After your visit with [place] today you will receive a survey in the mail or online from [employer] regarding your care with [place]. Please take a moment to fill this out. Your feedback is very important to [place] as you can  help [place] better understand your patient needs as well as improve your experience and satisfaction. WE CARE ABOUT YOU!!!   The patient should continue to make all efforts to lose weight and get more physical activity Drink more water and less drinks Eat less bread and watch cholesterol in the diet more closely Keep appointment with dermatology We will arrange for you to go see the gastroenterologist to have your colonoscopy if it is past due. Watch sodium intake Use a coolmist humidifier Make sure that soaps fabric softeners and detergents or scent free  Arrie Senate MD

## 2018-05-25 LAB — CBC WITH DIFFERENTIAL/PLATELET
BASOS ABS: 0.1 10*3/uL (ref 0.0–0.2)
Basos: 1 %
EOS (ABSOLUTE): 0.3 10*3/uL (ref 0.0–0.4)
Eos: 5 %
Hematocrit: 44.9 % (ref 37.5–51.0)
Hemoglobin: 15.2 g/dL (ref 13.0–17.7)
IMMATURE GRANULOCYTES: 2 %
Immature Grans (Abs): 0.1 10*3/uL (ref 0.0–0.1)
Lymphocytes Absolute: 1.7 10*3/uL (ref 0.7–3.1)
Lymphs: 25 %
MCH: 30.5 pg (ref 26.6–33.0)
MCHC: 33.9 g/dL (ref 31.5–35.7)
MCV: 90 fL (ref 79–97)
MONOS ABS: 0.5 10*3/uL (ref 0.1–0.9)
Monocytes: 8 %
NEUTROS PCT: 59 %
Neutrophils Absolute: 4 10*3/uL (ref 1.4–7.0)
Platelets: 349 10*3/uL (ref 150–450)
RBC: 4.98 x10E6/uL (ref 4.14–5.80)
RDW: 13.3 % (ref 11.6–15.4)
WBC: 6.8 10*3/uL (ref 3.4–10.8)

## 2018-05-25 LAB — BMP8+EGFR
BUN/Creatinine Ratio: 14 (ref 10–24)
BUN: 16 mg/dL (ref 8–27)
CALCIUM: 9.4 mg/dL (ref 8.6–10.2)
CO2: 20 mmol/L (ref 20–29)
Chloride: 103 mmol/L (ref 96–106)
Creatinine, Ser: 1.12 mg/dL (ref 0.76–1.27)
GFR calc Af Amer: 79 mL/min/{1.73_m2} (ref >59)
GFR calc non Af Amer: 69 mL/min/{1.73_m2} (ref >59)
GLUCOSE: 112 mg/dL — AB (ref 65–99)
POTASSIUM: 4.5 mmol/L (ref 3.5–5.2)
Sodium: 139 mmol/L (ref 134–144)

## 2018-05-25 LAB — LIPID PANEL
CHOL/HDL RATIO: 5 ratio (ref 0.0–5.0)
Cholesterol, Total: 229 mg/dL — ABNORMAL HIGH (ref 100–199)
HDL: 46 mg/dL (ref >39)
LDL Calculated: 146 mg/dL — ABNORMAL HIGH (ref 0–99)
Triglycerides: 187 mg/dL — ABNORMAL HIGH (ref 0–149)
VLDL CHOLESTEROL CAL: 37 mg/dL (ref 5–40)

## 2018-05-25 LAB — HEPATIC FUNCTION PANEL
ALBUMIN: 4.8 g/dL (ref 3.8–4.8)
ALT: 32 IU/L (ref 0–44)
AST: 22 IU/L (ref 0–40)
Alkaline Phosphatase: 59 IU/L (ref 39–117)
Bilirubin Total: 1.2 mg/dL (ref 0.0–1.2)
Bilirubin, Direct: 0.25 mg/dL (ref 0.00–0.40)
Total Protein: 7 g/dL (ref 6.0–8.5)

## 2018-05-25 LAB — TESTOSTERONE,FREE AND TOTAL
Testosterone, Free: 5.8 pg/mL — ABNORMAL LOW (ref 6.6–18.1)
Testosterone: 148 ng/dL — ABNORMAL LOW (ref 264–916)

## 2018-05-25 LAB — VITAMIN D 25 HYDROXY (VIT D DEFICIENCY, FRACTURES): Vit D, 25-Hydroxy: 24.4 ng/mL — ABNORMAL LOW (ref 30.0–100.0)

## 2018-05-27 ENCOUNTER — Other Ambulatory Visit: Payer: Self-pay | Admitting: *Deleted

## 2018-05-27 ENCOUNTER — Other Ambulatory Visit: Payer: Self-pay | Admitting: Family Medicine

## 2018-05-27 MED ORDER — CHOLECALCIFEROL 1.25 MG (50000 UT) PO CAPS: 50000.0000 [IU] | ORAL_CAPSULE | ORAL | 1 refills | Status: DC

## 2018-05-27 NOTE — Addendum Note (Signed)
Addended by: Zannie Cove on: 05/27/2018 02:51 PM   Modules accepted: Orders

## 2018-05-28 DIAGNOSIS — L821 Other seborrheic keratosis: Secondary | ICD-10-CM | POA: Diagnosis not present

## 2018-05-28 DIAGNOSIS — L570 Actinic keratosis: Secondary | ICD-10-CM | POA: Diagnosis not present

## 2018-05-28 DIAGNOSIS — D485 Neoplasm of uncertain behavior of skin: Secondary | ICD-10-CM | POA: Diagnosis not present

## 2018-05-28 DIAGNOSIS — C44311 Basal cell carcinoma of skin of nose: Secondary | ICD-10-CM | POA: Diagnosis not present

## 2018-05-28 DIAGNOSIS — D225 Melanocytic nevi of trunk: Secondary | ICD-10-CM | POA: Diagnosis not present

## 2018-05-28 NOTE — Telephone Encounter (Signed)
Last seen 05/24/18

## 2018-06-07 ENCOUNTER — Ambulatory Visit (INDEPENDENT_AMBULATORY_CARE_PROVIDER_SITE_OTHER): Payer: Medicare Other | Admitting: Family Medicine

## 2018-06-07 ENCOUNTER — Encounter: Payer: Self-pay | Admitting: Family Medicine

## 2018-06-07 VITALS — BP 141/75 | HR 80 | Temp 97.2°F | Ht 73.5 in | Wt 319.0 lb

## 2018-06-07 DIAGNOSIS — N529 Male erectile dysfunction, unspecified: Secondary | ICD-10-CM

## 2018-06-07 DIAGNOSIS — E349 Endocrine disorder, unspecified: Secondary | ICD-10-CM

## 2018-06-07 DIAGNOSIS — E782 Mixed hyperlipidemia: Secondary | ICD-10-CM

## 2018-06-07 DIAGNOSIS — E559 Vitamin D deficiency, unspecified: Secondary | ICD-10-CM | POA: Diagnosis not present

## 2018-06-07 MED ORDER — TESTOSTERONE CYPIONATE 200 MG/ML IM SOLN: 200.0000 mg | INTRAMUSCULAR | 1 refills | Status: DC

## 2018-06-07 MED ORDER — ROSUVASTATIN CALCIUM 10 MG PO TABS: 10.0000 mg | ORAL_TABLET | Freq: Every day | ORAL | 3 refills | Status: DC

## 2018-06-07 MED ORDER — TESTOSTERONE CYPIONATE 200 MG/ML IM SOLN
200.0000 mg | INTRAMUSCULAR | Status: DC
  Administered 2018-06-07 – 2019-07-07 (×11): 200 mg via INTRAMUSCULAR

## 2018-06-07 NOTE — Patient Instructions (Signed)
crestor sent in today  Today you will get your 1ST Testosterone injection.  You will get another in 1 mo when you see the nurse and lab for repeat liver test (because of crestor start)  If liver labs are ok on this day - we will add Vascepa to your current med regimen.   In 2.5 mos (in between injection ) we will have you come to the lab fasting before 10:00 am and have labs drawn for : Lipids Liver testosterone  CBC

## 2018-06-07 NOTE — Addendum Note (Signed)
Addended by: Zannie Cove on: 06/07/2018 03:27 PM   Modules accepted: Orders

## 2018-06-07 NOTE — Progress Notes (Signed)
Subjective:    Patient ID: Arthur Foster, male    DOB: January 06, 1951, 68 y.o.   MRN: [id number]  HPI Patient here today to discuss testosterone options.  Spent time today with frank discussion regarding all of his abnormal lab work which included elevated triglycerides elevated cholesterol decreased testosterone and low vitamin D.  The patient has already started vitamin D replacement..  We also discussed initiating testosterone injections at 200 mg once monthly.  We discussed the side effects of testosterone injections including elevated hemoglobin and possible rise in the PSA and because of this the need for checking testosterone levels regularly and doing rectal exams and PSAs at least every 6 months.  The patient will start Crestor at 10 mg nightly and will come by the office in about 4 weeks to get his next testosterone injection and we will do a liver panel at that time.  He knows to stop the Crestor if he develops any muscle aches or myalgias.  In 2-1/2 months, he will get a fasting lipid liver panel along with testosterone and CBC.  All of this was explained to him.  I also encouraged him to make every effort to do better with diet and exercise to achieve weight loss.  This will be the hardest thing for him to do.  He seemed to understand everything and and the patient note given to him when he left the office everything will be explained about when he needs to come back for additional blood work.   Patient Active Problem List   Diagnosis Date Noted  . Dyslipidemia 08/08/2017  . Abnormal EKG 08/08/2017  . Cervical somatic dysfunction 03/06/2016  . 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 Generalized anxiety disorder 03/06/2016  . Left knee pain 11/05/2014  . Allergic rhinitis 06/18/2013  . Severe obesity (BMI >= 40) ([place]) 06/18/2013  . BPH (benign prostatic hypertrophy) 07/03/2012  . Essential hypertension, benign 07/03/2012  . Depression 07/03/2012  . GERD (gastroesophageal reflux disease)  07/03/2012  . Testosterone deficiency 07/03/2012   Outpatient Encounter Medications as of 06/07/2018  Medication Sig  . ALPRAZolam (XANAX) 0.25 MG tablet TAKE 1 OR 2 TABLETS DAILY AS NEEDED  . cetirizine (ZYRTEC) 10 MG tablet TAKE 1 TABLET DAILY  . Cholecalciferol 1.25 MG (50000 UT) capsule Take 1 capsule (50,000 Units total) by mouth once a week.  . escitalopram (LEXAPRO) 20 MG tablet Take 1 tablet (20 mg total) by mouth daily.  [name] lisinopril (PRINIVIL,ZESTRIL) 20 MG tablet Take 1 tablet (20 mg total) by mouth daily.  . mometasone (ELOCON) 0.1 % cream APPLY SPARINGLY TWICE A DAY FOR 7 TO 10 DAYS   No facility-administered encounter medications on file as of 06/07/2018.       Review of Systems  Constitutional: Negative.   HENT: Negative.   Eyes: Negative.   Respiratory: Negative.   Cardiovascular: Negative.   Gastrointestinal: Negative.   Endocrine: Negative.   Genitourinary: Negative.   Musculoskeletal: Negative.   Skin: Negative.   Allergic/Immunologic: Negative.   Neurological: Negative.   Hematological: Negative.   Psychiatric/Behavioral: Negative.        Objective:   Physical Exam  BP (!) 141/75   Pulse 80   Temp (!) 97.2 F (36.2 C) (Oral)   Ht 6' 1.5" (1.867 m)   Wt (!) 319 lb (144.7 kg)   BMI 41.52 kg/m   Please see subjective information.  He has no other complaints physically other than his fatigue and tiredness and decreased libido.  Assessment & Plan:  1. Testosterone deficiency -Start testosterone injections 200 mg monthly -Repeat testosterone level in 2-1/2 months halfway in between injections. -Also at that time we will get a lipid liver level then  2. Mixed dyslipidemia -Continue with Crestor 10 mg nightly -If tolerating this well in 4 weeks we will start Vascepa 2 g twice daily  3. Erectile dysfunction, unspecified erectile dysfunction type -Testosterone 200 mg IM monthly  4. Morbid obesity ([place]) -Continue to work aggressively on weight  loss through diet and exercise   Meds ordered this encounter  Medications  . testosterone cypionate (DEPOTESTOSTERONE CYPIONATE) 200 MG/ML injection    Sig: Inject 1 mL (200 mg total) into the muscle every 28 (twenty-eight) days.    Dispense:  10 mL    Refill:  1  . rosuvastatin (CRESTOR) 10 MG tablet    Sig: Take 1 tablet (10 mg total) by mouth daily.    Dispense:  90 tablet    Refill:  3   Patient Instructions  crestor sent in today  Today you will get your 1ST Testosterone injection.  You will get another in 1 mo when you see the nurse and lab for repeat liver test (because of crestor start)  If liver labs are ok on this day - we will add Vascepa to your current med regimen.   In 2.5 mos (in between injection ) we will have you come to the lab fasting before 10:00 am and have labs drawn for : Lipids Liver testosterone  CBC    Arrie Senate MD   5. Vitamin D deficiency -Continue with vitamin D replacement which which is started at 50,000 units weekly. -Recheck vitamin D levels in 6 months.  Arrie Senate MD

## 2018-06-11 DIAGNOSIS — I10 Essential (primary) hypertension: Secondary | ICD-10-CM | POA: Diagnosis not present

## 2018-06-11 DIAGNOSIS — H40053 Ocular hypertension, bilateral: Secondary | ICD-10-CM | POA: Diagnosis not present

## 2018-06-17 DIAGNOSIS — H903 Sensorineural hearing loss, bilateral: Secondary | ICD-10-CM | POA: Insufficient documentation

## 2018-06-17 DIAGNOSIS — D164 Benign neoplasm of bones of skull and face: Secondary | ICD-10-CM | POA: Diagnosis not present

## 2018-07-08 ENCOUNTER — Other Ambulatory Visit: Payer: Self-pay

## 2018-07-08 ENCOUNTER — Telehealth: Payer: Self-pay | Admitting: Family Medicine

## 2018-07-08 ENCOUNTER — Ambulatory Visit (INDEPENDENT_AMBULATORY_CARE_PROVIDER_SITE_OTHER): Payer: Medicare Other | Admitting: *Deleted

## 2018-07-08 DIAGNOSIS — E349 Endocrine disorder, unspecified: Secondary | ICD-10-CM | POA: Diagnosis not present

## 2018-07-08 NOTE — Telephone Encounter (Signed)
Pt called and will get shot today

## 2018-08-07 ENCOUNTER — Ambulatory Visit (INDEPENDENT_AMBULATORY_CARE_PROVIDER_SITE_OTHER): Payer: Medicare Other | Admitting: *Deleted

## 2018-08-07 ENCOUNTER — Other Ambulatory Visit: Payer: Self-pay

## 2018-08-07 DIAGNOSIS — E349 Endocrine disorder, unspecified: Secondary | ICD-10-CM | POA: Diagnosis not present

## 2018-08-07 NOTE — Progress Notes (Signed)
Pt given Testosterone inj Tolerated well 

## 2018-08-21 ENCOUNTER — Other Ambulatory Visit: Payer: Medicare Other

## 2018-08-22 ENCOUNTER — Other Ambulatory Visit: Payer: Self-pay

## 2018-08-22 ENCOUNTER — Other Ambulatory Visit: Payer: Medicare Other

## 2018-08-22 DIAGNOSIS — E559 Vitamin D deficiency, unspecified: Secondary | ICD-10-CM

## 2018-08-22 DIAGNOSIS — E349 Endocrine disorder, unspecified: Secondary | ICD-10-CM

## 2018-08-22 DIAGNOSIS — E782 Mixed hyperlipidemia: Secondary | ICD-10-CM | POA: Diagnosis not present

## 2018-08-22 DIAGNOSIS — N529 Male erectile dysfunction, unspecified: Secondary | ICD-10-CM

## 2018-08-23 LAB — HEPATIC FUNCTION PANEL
ALT: 32 IU/L (ref 0–44)
AST: 26 IU/L (ref 0–40)
Albumin: 4.5 g/dL (ref 3.8–4.8)
Alkaline Phosphatase: 63 IU/L (ref 39–117)
Bilirubin Total: 0.9 mg/dL (ref 0.0–1.2)
Bilirubin, Direct: 0.21 mg/dL (ref 0.00–0.40)
Total Protein: 6.7 g/dL (ref 6.0–8.5)

## 2018-08-23 LAB — CBC WITH DIFFERENTIAL/PLATELET
Basophils Absolute: 0 10*3/uL (ref 0.0–0.2)
Basos: 1 %
EOS (ABSOLUTE): 0.3 10*3/uL (ref 0.0–0.4)
Eos: 5 %
Hematocrit: 47.9 % (ref 37.5–51.0)
Hemoglobin: 16.6 g/dL (ref 13.0–17.7)
Immature Grans (Abs): 0.1 10*3/uL (ref 0.0–0.1)
Immature Granulocytes: 1 %
Lymphocytes Absolute: 1.8 10*3/uL (ref 0.7–3.1)
Lymphs: 28 %
MCH: 30.5 pg (ref 26.6–33.0)
MCHC: 34.7 g/dL (ref 31.5–35.7)
MCV: 88 fL (ref 79–97)
Monocytes Absolute: 0.5 10*3/uL (ref 0.1–0.9)
Monocytes: 8 %
Neutrophils Absolute: 3.7 10*3/uL (ref 1.4–7.0)
Neutrophils: 57 %
Platelets: 339 10*3/uL (ref 150–450)
RBC: 5.45 x10E6/uL (ref 4.14–5.80)
RDW: 12.2 % (ref 11.6–15.4)
WBC: 6.5 10*3/uL (ref 3.4–10.8)

## 2018-08-23 LAB — LIPID PANEL
Chol/HDL Ratio: 5 ratio (ref 0.0–5.0)
Cholesterol, Total: 195 mg/dL (ref 100–199)
HDL: 39 mg/dL — ABNORMAL LOW
LDL Calculated: 127 mg/dL — ABNORMAL HIGH (ref 0–99)
Triglycerides: 144 mg/dL (ref 0–149)
VLDL Cholesterol Cal: 29 mg/dL (ref 5–40)

## 2018-08-23 LAB — TESTOSTERONE,FREE AND TOTAL
Testosterone, Free: 9 pg/mL (ref 6.6–18.1)
Testosterone: 306 ng/dL (ref 264–916)

## 2018-09-20 ENCOUNTER — Ambulatory Visit (INDEPENDENT_AMBULATORY_CARE_PROVIDER_SITE_OTHER): Payer: Medicare Other | Admitting: Family Medicine

## 2018-09-20 ENCOUNTER — Encounter: Payer: Self-pay | Admitting: Family Medicine

## 2018-09-20 ENCOUNTER — Other Ambulatory Visit: Payer: Self-pay

## 2018-09-20 VITALS — BP 138/78 | HR 80 | Temp 97.3°F | Ht 73.0 in | Wt 318.0 lb

## 2018-09-20 DIAGNOSIS — L03114 Cellulitis of left upper limb: Secondary | ICD-10-CM

## 2018-09-20 MED ORDER — DOXYCYCLINE HYCLATE 100 MG PO TABS: 100.0000 mg | ORAL_TABLET | Freq: Two times a day (BID) | ORAL | 0 refills | Status: AC

## 2018-09-20 NOTE — Progress Notes (Signed)
Subjective:  Patient ID: Arthur Foster, male    DOB: [birth year], 68 y.o.   MRN: [id number]  Chief Complaint:  Hand Pain   HPI: Arthur Foster is a 68 y.o. male presenting on 09/20/2018 for Hand Pain  Pt presents today with complaints of swelling, redness and pain to the top of his left hand. Pt states he noticed this a day or two ago. States he did have a mosquito bite several days ago that was pruritic and he was scratching. States it went away and then the swelling, tenderness, and erythema appeared. He denies injury. States the pain is 4/10 at worst. He has not tried anything for the pain. No fever, chills, swelling of extremity, confusion, fatigue, or weakness.   Relevant past medical, surgical, family, and social history reviewed and updated as indicated.  Allergies and medications reviewed and updated.   Past Medical History:  Diagnosis Date  . Anxiety   . BPH (benign prostatic hyperplasia)   . Depression   . Hypertension   . Testosterone deficiency     Past Surgical History:  Procedure Laterality Date  . FOOT SURGERY    . HERNIA REPAIR Left inguinal    Social History   Socioeconomic History  . Marital status: Married    Spouse name: Not on file  . Number of children: Not on file  . Years of education: Not on file  . Highest education level: Not on file  Occupational History  . Not on file  Social Needs  . Financial resource strain: Not on file  . Food insecurity    Worry: Not on file    Inability: Not on file  . Transportation needs    Medical: Not on file    Non-medical: Not on file  Tobacco Use  . Smoking status: Former Smoker    Quit date: 12/19/2000    Years since quitting: 17.7  . Smokeless tobacco: Never Used  Substance and Sexual Activity  . Alcohol use: Yes  . Drug use: No  . Sexual activity: Not on file  Lifestyle  . Physical activity    Days per week: Not on file    Minutes per session: Not on file  . Stress: Not on file  Relationships  .  Social Herbalist on phone: Not on file    Gets together: Not on file    Attends religious service: Not on file    Active member of club or organization: Not on file    Attends meetings of clubs or organizations: Not on file    Relationship status: Not on file  . Intimate partner violence    Fear of current or ex partner: Not on file    Emotionally abused: Not on file    Physically abused: Not on file    Forced sexual activity: Not on file  Other Topics Concern  . Not on file  Social History Narrative  . Not on file    Outpatient Encounter Medications as of 09/20/2018  Medication Sig  . ALPRAZolam (XANAX) 0.25 MG tablet TAKE 1 OR 2 TABLETS DAILY AS NEEDED  . cetirizine (ZYRTEC) 10 MG tablet TAKE 1 TABLET DAILY  . Cholecalciferol 1.25 MG (50000 UT) capsule Take 1 capsule (50,000 Units total) by mouth once a week.  . escitalopram (LEXAPRO) 20 MG tablet Take 1 tablet (20 mg total) by mouth daily.  [name] lisinopril (PRINIVIL,ZESTRIL) 20 MG tablet Take 1 tablet (20 mg total) by mouth daily.  [name]  mometasone (ELOCON) 0.1 % cream APPLY SPARINGLY TWICE A DAY FOR 7 TO 10 DAYS  . rosuvastatin (CRESTOR) 10 MG tablet Take 1 tablet (10 mg total) by mouth daily.  [name] testosterone cypionate (DEPOTESTOSTERONE CYPIONATE) 200 MG/ML injection Inject 1 mL (200 mg total) into the muscle every 28 (twenty-eight) days.  [name] doxycycline (VIBRA-TABS) 100 MG tablet Take 1 tablet (100 mg total) by mouth 2 (two) times daily for 7 days. 1 po bid   Facility-Administered Encounter Medications as of 09/20/2018  Medication  . testosterone cypionate (DEPOTESTOSTERONE CYPIONATE) injection 200 mg    Allergies  Allergen Reactions  . Ambien [Zolpidem Tartrate] Other (See Comments)    Review of Systems  Constitutional: Negative for chills, fatigue and fever.  Respiratory: Negative for cough, chest tightness and shortness of breath.   Cardiovascular: Negative for chest pain and palpitations.  Genitourinary: Negative  for decreased urine volume and difficulty urinating.  Musculoskeletal: Negative for joint swelling.  Skin: Positive for color change.       Lesion to left hand  Neurological: Negative for dizziness, weakness and headaches.  Psychiatric/Behavioral: Negative for confusion.  All other systems reviewed and are negative.       Objective:  BP 138/78   Pulse 80   Temp (!) 97.3 F (36.3 C) (Oral)   Ht 6\' 1"  (1.854 m)   Wt (!) 318 lb (144.2 kg)   BMI 41.96 kg/m    Wt Readings from Last 3 Encounters:  09/20/18 (!) 318 lb (144.2 kg)  06/07/18 (!) 319 lb (144.7 kg)  05/24/18 (!) 319 lb (144.7 kg)    Physical Exam Vitals signs and nursing note reviewed.  Constitutional:      General: He is not in acute distress.    Appearance: Normal appearance. He is obese. He is not ill-appearing, toxic-appearing or diaphoretic.  HENT:     Head: Normocephalic and atraumatic.     Mouth/Throat:     Mouth: Mucous membranes are moist.  Eyes:     Conjunctiva/sclera: Conjunctivae normal.     Pupils: Pupils are equal, round, and reactive to light.  Cardiovascular:     Rate and Rhythm: Normal rate and regular rhythm.     Pulses: Normal pulses.     Heart sounds: Normal heart sounds. No murmur. No friction rub. No gallop.   Pulmonary:     Effort: Pulmonary effort is normal. No respiratory distress.     Breath sounds: Normal breath sounds.  Musculoskeletal: Normal range of motion.        General: No swelling.  Skin:    General: Skin is warm and dry.     Capillary Refill: Capillary refill takes less than 2 seconds.     Findings: Erythema, lesion and rash present. Rash is nodular.       Neurological:     General: No focal deficit present.     Mental Status: He is alert and oriented to person, place, and time.  Psychiatric:        Mood and Affect: Mood normal.        Behavior: Behavior normal.        Thought Content: Thought content normal.        Judgment: Judgment normal.     Results for  orders placed or performed in visit on 08/22/18  Testosterone,Free and Total  Result Value Ref Range   Testosterone 306 264 - 916 ng/dL   Testosterone, Free 9.0 6.6 - 18.1 pg/mL  CBC with Differential/Platelet  Result Value  Ref Range   WBC 6.5 3.4 - 10.8 x10E3/uL   RBC 5.45 4.14 - 5.80 x10E6/uL   Hemoglobin 16.6 13.0 - 17.7 g/dL   Hematocrit 47.9 37.5 - 51.0 %   MCV 88 79 - 97 fL   MCH 30.5 26.6 - 33.0 pg   MCHC 34.7 31.5 - 35.7 g/dL   RDW 12.2 11.6 - 15.4 %   Platelets 339 150 - 450 x10E3/uL   Neutrophils 57 Not Estab. %   Lymphs 28 Not Estab. %   Monocytes 8 Not Estab. %   Eos 5 Not Estab. %   Basos 1 Not Estab. %   Neutrophils Absolute 3.7 1.4 - 7.0 x10E3/uL   Lymphocytes Absolute 1.8 0.7 - 3.1 x10E3/uL   Monocytes Absolute 0.5 0.1 - 0.9 x10E3/uL   EOS (ABSOLUTE) 0.3 0.0 - 0.4 x10E3/uL   Basophils Absolute 0.0 0.0 - 0.2 x10E3/uL   Immature Granulocytes 1 Not Estab. %   Immature Grans (Abs) 0.1 0.0 - 0.1 x10E3/uL  Hepatic function panel  Result Value Ref Range   Total Protein 6.7 6.0 - 8.5 g/dL   Albumin 4.5 3.8 - 4.8 g/dL   Bilirubin Total 0.9 0.0 - 1.2 mg/dL   Bilirubin, Direct 0.21 0.00 - 0.40 mg/dL   Alkaline Phosphatase 63 39 - 117 IU/L   AST 26 0 - 40 IU/L   ALT 32 0 - 44 IU/L  Lipid panel  Result Value Ref Range   Cholesterol, Total 195 100 - 199 mg/dL   Triglycerides 144 0 - 149 mg/dL   HDL 39 (L) >39 mg/dL   VLDL Cholesterol Cal 29 5 - 40 mg/dL   LDL Calculated 127 (H) 0 - 99 mg/dL   Chol/HDL Ratio 5.0 0.0 - 5.0 ratio       Pertinent labs & imaging results that were available during my care of the patient were reviewed by me and considered in my medical decision making.  Assessment & Plan:  Arthur Foster was seen today for hand pain.  Diagnoses and all orders for this visit:  Cellulitis of left hand Approximate 3 cm raised area to left proximal lateral dorsal hand with tenderness, erythema, and increased warmth. Unable to transilluminate so unlikely a  ganglion cyst. Not fluctuant. No obvious wound but signs and symptoms are suggestive of cellulitis. No signs of systemic infection. Will treat with below. Symptomatic care discussed report any new or worsening symptoms. Medications as prescribed.  -     doxycycline (VIBRA-TABS) 100 MG tablet; Take 1 tablet (100 mg total) by mouth 2 (two) times daily for 7 days. 1 po bid     Continue all other maintenance medications.  Follow up plan: Return if symptoms worsen or fail to improve.  Educational handout given for cellulitis  The above assessment and management plan was discussed with the patient. The patient verbalized understanding of and has agreed to the management plan. Patient is aware to call the clinic if symptoms persist or worsen. Patient is aware when to return to the clinic for a follow-up visit. Patient educated on when it is appropriate to go to the emergency department.   Monia Pouch, FNP-C [facility] [phone]

## 2018-09-20 NOTE — Patient Instructions (Signed)

## 2018-10-08 ENCOUNTER — Ambulatory Visit (INDEPENDENT_AMBULATORY_CARE_PROVIDER_SITE_OTHER): Payer: Medicare Other | Admitting: *Deleted

## 2018-10-08 ENCOUNTER — Other Ambulatory Visit: Payer: Self-pay

## 2018-10-08 DIAGNOSIS — E349 Endocrine disorder, unspecified: Secondary | ICD-10-CM

## 2018-10-08 NOTE — Progress Notes (Signed)
Pt given Testosterone inj Tolerated well 

## 2018-10-15 DIAGNOSIS — M79671 Pain in right foot: Secondary | ICD-10-CM | POA: Diagnosis not present

## 2018-10-16 DIAGNOSIS — C44311 Basal cell carcinoma of skin of nose: Secondary | ICD-10-CM | POA: Diagnosis not present

## 2018-10-16 DIAGNOSIS — Z85828 Personal history of other malignant neoplasm of skin: Secondary | ICD-10-CM | POA: Diagnosis not present

## 2018-10-18 ENCOUNTER — Encounter: Payer: Self-pay | Admitting: *Deleted

## 2018-11-06 ENCOUNTER — Other Ambulatory Visit: Payer: Self-pay

## 2018-11-06 ENCOUNTER — Ambulatory Visit (INDEPENDENT_AMBULATORY_CARE_PROVIDER_SITE_OTHER): Payer: Medicare Other | Admitting: *Deleted

## 2018-11-06 DIAGNOSIS — E349 Endocrine disorder, unspecified: Secondary | ICD-10-CM | POA: Diagnosis not present

## 2018-11-06 NOTE — Progress Notes (Signed)
Pt given Testosterone inj Tolerated well 

## 2018-11-13 ENCOUNTER — Other Ambulatory Visit: Payer: Self-pay | Admitting: Family Medicine

## 2018-11-14 ENCOUNTER — Other Ambulatory Visit: Payer: Self-pay | Admitting: Family Medicine

## 2018-11-20 ENCOUNTER — Other Ambulatory Visit: Payer: Self-pay | Admitting: Family Medicine

## 2018-11-21 ENCOUNTER — Other Ambulatory Visit: Payer: Self-pay

## 2018-11-22 ENCOUNTER — Ambulatory Visit: Payer: Medicare Other | Admitting: Family Medicine

## 2018-11-22 ENCOUNTER — Encounter: Payer: Self-pay | Admitting: Family Medicine

## 2018-11-22 ENCOUNTER — Ambulatory Visit (INDEPENDENT_AMBULATORY_CARE_PROVIDER_SITE_OTHER): Payer: Medicare Other | Admitting: Family Medicine

## 2018-11-22 VITALS — BP 147/82 | HR 71 | Temp 98.0°F | Ht 73.0 in | Wt 320.0 lb

## 2018-11-22 DIAGNOSIS — F411 Generalized anxiety disorder: Secondary | ICD-10-CM

## 2018-11-22 DIAGNOSIS — E785 Hyperlipidemia, unspecified: Secondary | ICD-10-CM | POA: Diagnosis not present

## 2018-11-22 DIAGNOSIS — E559 Vitamin D deficiency, unspecified: Secondary | ICD-10-CM | POA: Diagnosis not present

## 2018-11-22 DIAGNOSIS — I10 Essential (primary) hypertension: Secondary | ICD-10-CM | POA: Diagnosis not present

## 2018-11-22 DIAGNOSIS — R739 Hyperglycemia, unspecified: Secondary | ICD-10-CM | POA: Insufficient documentation

## 2018-11-22 DIAGNOSIS — F339 Major depressive disorder, recurrent, unspecified: Secondary | ICD-10-CM | POA: Diagnosis not present

## 2018-11-22 DIAGNOSIS — E349 Endocrine disorder, unspecified: Secondary | ICD-10-CM

## 2018-11-22 DIAGNOSIS — Z79899 Other long term (current) drug therapy: Secondary | ICD-10-CM | POA: Diagnosis not present

## 2018-11-22 DIAGNOSIS — K219 Gastro-esophageal reflux disease without esophagitis: Secondary | ICD-10-CM

## 2018-11-22 LAB — BAYER DCA HB A1C WAIVED: HB A1C (BAYER DCA - WAIVED): 5.7 % (ref <7.0)

## 2018-11-22 MED ORDER — VITAMIN D (ERGOCALCIFEROL) 1.25 MG (50000 UNIT) PO CAPS: ORAL_CAPSULE | ORAL | 5 refills | Status: DC

## 2018-11-22 MED ORDER — ALPRAZOLAM 0.25 MG PO TABS: ORAL_TABLET | ORAL | 5 refills | Status: DC

## 2018-11-22 MED ORDER — LISINOPRIL 20 MG PO TABS: 20.0000 mg | ORAL_TABLET | Freq: Every day | ORAL | 3 refills | Status: DC

## 2018-11-22 MED ORDER — ESCITALOPRAM OXALATE 20 MG PO TABS: 20.0000 mg | ORAL_TABLET | Freq: Every day | ORAL | 3 refills | Status: DC

## 2018-11-22 MED ORDER — TESTOSTERONE CYPIONATE 200 MG/ML IM SOLN: 200.0000 mg | INTRAMUSCULAR | 1 refills | Status: DC

## 2018-11-22 NOTE — Patient Instructions (Signed)
It was a pleasure seeing you today, Arthur Foster.  Information regarding what we discussed is included in this packet.  Please make an appointment to see me in 6 months.   In a few days you may receive a survey in the mail or online from [employer] regarding your visit with [place] today. Please take a moment to fill this out. Your feedback is very important to our office. It can help [place] better understand your needs as well as improve your experience and satisfaction. Thank you for taking your time to complete it. We care about you.  Because of recent events of COVID-19 ("Coronavirus"), please follow CDC recommendations:   1. Wash your hand frequently 2. Avoid touching your face 3. Stay away from people who are sick 4. If you have symptoms such as fever, cough, shortness of breath then call your healthcare provider for further guidance 5. If you are sick, STAY AT HOME, unless otherwise directed by your healthcare provider. 6. Follow directions from state and national officials regarding staying safe    Please feel free to call our office if any questions or concerns arise.  Warm Regards, Monia Pouch, FNP-C [facility] [address] [place], [place] [address] [phone]

## 2018-11-22 NOTE — Progress Notes (Signed)
Subjective:  Patient ID: Arthur Foster, male    DOB: 1950/08/09, 68 y.o.   MRN: [id number]  Patient Care Team: Baruch Gouty, FNP as PCP - General (Family Medicine)   Chief Complaint:  Medical Management of Chronic Issues (test def.), Hyperlipidemia, and Hypertension   HPI: Arthur Foster is a 68 y.o. male presenting on 11/22/2018 for Medical Management of Chronic Issues (test def.), Hyperlipidemia, and Hypertension   1. Essential hypertension, benign  Complaint with meds - Yes Current Medications - lisinopril Checking BP at home - No Exercising Regularly - No Watching Salt intake - No Pertinent ROS:  Headache - No Fatigue - Yes Visual Disturbances - No Chest pain - No Dyspnea - No Palpitations - No LE edema - No They report good compliance with medications and can restate their regimen by memory. No medication side effects.  Family, social, and smoking history reviewed.   BP Readings from Last 3 Encounters:  11/22/18 (!) 147/82  09/20/18 138/78  06/07/18 (!) 141/75   CMP Latest Ref Rng & Units 08/22/2018 05/24/2018 11/21/2017  Glucose 65 - 99 mg/dL - 112(H) 107(H)  BUN 8 - 27 mg/dL - 16 14  Creatinine 0.76 - 1.27 mg/dL - 1.12 1.14  Sodium 134 - 144 mmol/L - 139 141  Potassium 3.5 - 5.2 mmol/L - 4.5 5.1  Chloride 96 - 106 mmol/L - 103 100  CO2 20 - 29 mmol/L - 20 24  Calcium 8.6 - 10.2 mg/dL - 9.4 9.9  Total Protein 6.0 - 8.5 g/dL 6.7 7.0 7.3  Total Bilirubin 0.0 - 1.2 mg/dL 0.9 1.2 1.3(H)  Alkaline Phos 39 - 117 IU/L 63 59 58  AST 0 - 40 IU/L '26 22 22  ' ALT 0 - 44 IU/L 32 32 28      2. Gastroesophageal reflux disease without esophagitis  Diet controlled, Tums if needed, not often Cough - No Sore throat - No Voice change - No Hemoptysis - No Dysphagia or dyspepsia - No Water brash - No Red Flags (weight loss, hematochezia, melena, weight loss, early satiety, fevers, odynophagia, or persistent vomiting) - No   3. Testosterone deficiency  On repletion therapy.  Does have increased fatigue and stamina over the last several weeks.    4. Dyslipidemia  Compliant with medications - No, never initiated statin therapy, did not want to risk the side effects Current medications - none Diet - does not watch Exercise - none  Lab Results  Component Value Date   CHOL 195 08/22/2018   HDL 39 (L) 08/22/2018   LDLCALC 127 (H) 08/22/2018   TRIG 144 08/22/2018   CHOLHDL 5.0 08/22/2018     Family and personal medical history reviewed. Smoking and ETOH history reviewed.    5. Generalized anxiety disorder  Well controlled with Lexapro and Xanax as needed. No SI or HI.  GAD 7 : Generalized Anxiety Score 11/22/2018  Nervous, Anxious, on Edge 0  Control/stop worrying 0  Worry too much - different things 1  Trouble relaxing 1  Restless 0  Easily annoyed or irritable 0  Afraid - awful might happen 0  Total GAD 7 Score 2     6. Severe obesity (BMI >= 40) (HCC)  Does not watch diet or exercise on a regular basis.    7. Depression, recurrent ([place])  Well controlled on Lexapro. No SI or HI.  Depression screen [facility] 2/9 11/22/2018 09/20/2018 06/07/2018 05/24/2018 04/01/2018  Decreased Interest 0 0 0 0 0  Down,  Depressed, Hopeless 0 0 0 0 0  PHQ - 2 Score 0 0 0 0 0     8. Blood glucose elevated  Last several blood glucoses have been elevated. No history of DM. No polyuria, polydipsia, or polyphagia. No neuropathy or visual changes.  CMP Latest Ref Rng & Units 08/22/2018 05/24/2018 11/21/2017  Glucose 65 - 99 mg/dL - 112(H) 107(H)  BUN 8 - 27 mg/dL - 16 14  Creatinine 0.76 - 1.27 mg/dL - 1.12 1.14  Sodium 134 - 144 mmol/L - 139 141  Potassium 3.5 - 5.2 mmol/L - 4.5 5.1  Chloride 96 - 106 mmol/L - 103 100  CO2 20 - 29 mmol/L - 20 24  Calcium 8.6 - 10.2 mg/dL - 9.4 9.9  Total Protein 6.0 - 8.5 g/dL 6.7 7.0 7.3  Total Bilirubin 0.0 - 1.2 mg/dL 0.9 1.2 1.3(H)  Alkaline Phos 39 - 117 IU/L 63 59 58  AST 0 - 40 IU/L '26 22 22  ' ALT 0 - 44 IU/L 32 32 28     9.  Controlled substance agreement signed  Pt takes Xanax as needed at night for anxiety and insomnia. Agreement updated today.    10. Vitamin D deficiency  Pt is taking oral repletion therapy. Denies bone pain and tenderness, muscle weakness, fracture, and difficulty walking. Lab Results  Component Value Date   VD25OH 24.4 (L) 05/24/2018   VD25OH 38.2 11/21/2017   VD25OH 33.5 05/24/2017   Lab Results  Component Value Date   CALCIUM 9.4 05/24/2018         Relevant past medical, surgical, family, and social history reviewed and updated as indicated.  Allergies and medications reviewed and updated. Date reviewed: Chart in Epic.   Past Medical History:  Diagnosis Date  . Anxiety   . BPH (benign prostatic hyperplasia)   . Depression   . Hypertension   . Testosterone deficiency     Past Surgical History:  Procedure Laterality Date  . FOOT SURGERY    . HERNIA REPAIR Left inguinal    Social History   Socioeconomic History  . Marital status: Married    Spouse name: Not on file  . Number of children: Not on file  . Years of education: Not on file  . Highest education level: Not on file  Occupational History  . Not on file  Social Needs  . Financial resource strain: Not on file  . Food insecurity    Worry: Not on file    Inability: Not on file  . Transportation needs    Medical: Not on file    Non-medical: Not on file  Tobacco Use  . Smoking status: Former Smoker    Quit date: 12/19/2000    Years since quitting: 17.9  . Smokeless tobacco: Never Used  Substance and Sexual Activity  . Alcohol use: Yes  . Drug use: No  . Sexual activity: Not on file  Lifestyle  . Physical activity    Days per week: Not on file    Minutes per session: Not on file  . Stress: Not on file  Relationships  . Social Herbalist on phone: Not on file    Gets together: Not on file    Attends religious service: Not on file    Active member of club or organization: Not on file     Attends meetings of clubs or organizations: Not on file    Relationship status: Not on file  . Intimate partner violence  Fear of current or ex partner: Not on file    Emotionally abused: Not on file    Physically abused: Not on file    Forced sexual activity: Not on file  Other Topics Concern  . Not on file  Social History Narrative  . Not on file    Outpatient Encounter Medications as of 11/22/2018  Medication Sig  . ALPRAZolam (XANAX) 0.25 MG tablet TAKE 1 OR 2 TABLETS DAILY AS NEEDED  . cetirizine (ZYRTEC) 10 MG tablet TAKE 1 TABLET DAILY  . escitalopram (LEXAPRO) 20 MG tablet Take 1 tablet (20 mg total) by mouth daily.  [name] lisinopril (ZESTRIL) 20 MG tablet Take 1 tablet (20 mg total) by mouth daily.  . mometasone (ELOCON) 0.1 % cream APPLY SPARINGLY TWICE A DAY FOR 7 TO 10 DAYS  . testosterone cypionate (DEPOTESTOSTERONE CYPIONATE) 200 MG/ML injection Inject 1 mL (200 mg total) into the muscle every 28 (twenty-eight) days.  . Vitamin D, Ergocalciferol, (DRISDOL) 1.25 MG (50000 UT) CAPS capsule Take 1 capsule (50,000 Units total) by mouth once a week.  . [DISCONTINUED] ALPRAZolam (XANAX) 0.25 MG tablet TAKE 1 OR 2 TABLETS DAILY AS NEEDED  . [DISCONTINUED] escitalopram (LEXAPRO) 20 MG tablet Take 1 tablet (20 mg total) by mouth daily.  . [DISCONTINUED] lisinopril (PRINIVIL,ZESTRIL) 20 MG tablet Take 1 tablet (20 mg total) by mouth daily.  . [DISCONTINUED] testosterone cypionate (DEPOTESTOSTERONE CYPIONATE) 200 MG/ML injection Inject 1 mL (200 mg total) into the muscle every 28 (twenty-eight) days.  . [DISCONTINUED] Vitamin D, Ergocalciferol, (DRISDOL) 1.25 MG (50000 UT) CAPS capsule Take 1 capsule (50,000 Units total) by mouth once a week.  . [DISCONTINUED] rosuvastatin (CRESTOR) 10 MG tablet Take 1 tablet (10 mg total) by mouth daily.   Facility-Administered Encounter Medications as of 11/22/2018  Medication  . testosterone cypionate (DEPOTESTOSTERONE CYPIONATE) injection 200 mg     Allergies  Allergen Reactions  . Ambien [Zolpidem Tartrate] Other (See Comments)    Review of Systems  Constitutional: Positive for fatigue. Negative for activity change, appetite change, chills, diaphoresis, fever and unexpected weight change.  HENT: Negative.   Eyes: Negative.  Negative for photophobia and visual disturbance.  Respiratory: Negative for cough, chest tightness, shortness of breath and wheezing.   Cardiovascular: Negative for chest pain, palpitations and leg swelling.  Gastrointestinal: Negative for abdominal distention, abdominal pain, anal bleeding, blood in stool, constipation, diarrhea, nausea, rectal pain and vomiting.  Endocrine: Negative.  Negative for cold intolerance, heat intolerance, polydipsia, polyphagia and polyuria.  Genitourinary: Negative for decreased urine volume, difficulty urinating, dysuria, frequency and urgency.  Musculoskeletal: Negative for arthralgias, back pain, gait problem, joint swelling, myalgias, neck pain and neck stiffness.  Skin: Negative.  Negative for color change, pallor, rash and wound.  Allergic/Immunologic: Negative.   Neurological: Negative for dizziness, tremors, seizures, syncope, facial asymmetry, speech difficulty, weakness, light-headedness, numbness and headaches.  Hematological: Negative.   Psychiatric/Behavioral: Positive for decreased concentration. Negative for agitation, behavioral problems, confusion, dysphoric mood, hallucinations, self-injury, sleep disturbance and suicidal ideas. The patient is nervous/anxious. The patient is not hyperactive.   All other systems reviewed and are negative.       Objective:  BP (!) 147/82   Pulse 71   Temp 98 F (36.7 C) (Oral)   Ht '6\' 1"'  (1.854 m)   Wt (!) 320 lb (145.2 kg)   BMI 42.22 kg/m    Wt Readings from Last 3 Encounters:  11/22/18 (!) 320 lb (145.2 kg)  09/20/18 (!) 318 lb (144.2 kg)  06/07/18 (!) 319 lb (144.7 kg)    Physical Exam Vitals signs and nursing  note reviewed.  Constitutional:      General: He is not in acute distress.    Appearance: Normal appearance. He is well-developed and well-groomed. He is morbidly obese. He is not ill-appearing, toxic-appearing or diaphoretic.  HENT:     Head: Normocephalic and atraumatic.     Jaw: There is normal jaw occlusion.     Right Ear: Hearing, tympanic membrane, ear canal and external ear normal.     Left Ear: Hearing and tympanic membrane normal.     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     Mouth/Throat:     Lips: Pink.     Mouth: Mucous membranes are moist.     Pharynx: Oropharynx is clear. Uvula midline.  Eyes:     General: Lids are normal.     Extraocular Movements: Extraocular movements intact.     Conjunctiva/sclera: Conjunctivae normal.     Pupils: Pupils are equal, round, and reactive to light.  Neck:     Musculoskeletal: Normal range of motion and neck supple.     Thyroid: No thyroid mass, thyromegaly or thyroid tenderness.     Vascular: No carotid bruit or JVD.     Trachea: Trachea and phonation normal.  Cardiovascular:     Rate and Rhythm: Normal rate and regular rhythm.     Chest Wall: PMI is not displaced.     Pulses: Normal pulses.     Heart sounds: Normal heart sounds. No murmur. No friction rub. No gallop.   Pulmonary:     Effort: Pulmonary effort is normal. No respiratory distress.     Breath sounds: Normal breath sounds. No wheezing.  Abdominal:     General: Abdomen is protuberant. Bowel sounds are normal. There is no distension or abdominal bruit.     Palpations: Abdomen is soft. There is no hepatomegaly or splenomegaly.     Tenderness: There is no abdominal tenderness. There is no right CVA tenderness or left CVA tenderness.     Hernia: No hernia is present.  Musculoskeletal: Normal range of motion.     Right lower leg: No edema.     Left lower leg: No edema.  Lymphadenopathy:     Cervical: No cervical  adenopathy.  Skin:    General: Skin is warm and dry.     Capillary Refill: Capillary refill takes less than 2 seconds.     Coloration: Skin is not cyanotic, jaundiced or pale.     Findings: No rash.  Neurological:     General: No focal deficit present.     Mental Status: He is alert and oriented to person, place, and time.     Cranial Nerves: Cranial nerves are intact.     Sensory: Sensation is intact.     Motor: Motor function is intact.     Coordination: Coordination is intact.     Gait: Gait is intact.     Deep Tendon Reflexes: Reflexes are normal and symmetric.  Psychiatric:        Attention and Perception: Attention and perception normal.        Mood and Affect: Mood and affect normal.        Speech: Speech normal.        Behavior: Behavior normal. Behavior is cooperative.        Thought Content: Thought content  normal. Thought content does not include homicidal or suicidal ideation. Thought content does not include homicidal or suicidal plan.        Cognition and Memory: Cognition and memory normal.        Judgment: Judgment normal.     Results for orders placed or performed in visit on 08/22/18  Testosterone,Free and Total  Result Value Ref Range   Testosterone 306 264 - 916 ng/dL   Testosterone, Free 9.0 6.6 - 18.1 pg/mL  CBC with Differential/Platelet  Result Value Ref Range   WBC 6.5 3.4 - 10.8 x10E3/uL   RBC 5.45 4.14 - 5.80 x10E6/uL   Hemoglobin 16.6 13.0 - 17.7 g/dL   Hematocrit 47.9 37.5 - 51.0 %   MCV 88 79 - 97 fL   MCH 30.5 26.6 - 33.0 pg   MCHC 34.7 31.5 - 35.7 g/dL   RDW 12.2 11.6 - 15.4 %   Platelets 339 150 - 450 x10E3/uL   Neutrophils 57 Not Estab. %   Lymphs 28 Not Estab. %   Monocytes 8 Not Estab. %   Eos 5 Not Estab. %   Basos 1 Not Estab. %   Neutrophils Absolute 3.7 1.4 - 7.0 x10E3/uL   Lymphocytes Absolute 1.8 0.7 - 3.1 x10E3/uL   Monocytes Absolute 0.5 0.1 - 0.9 x10E3/uL   EOS (ABSOLUTE) 0.3 0.0 - 0.4 x10E3/uL   Basophils Absolute 0.0  0.0 - 0.2 x10E3/uL   Immature Granulocytes 1 Not Estab. %   Immature Grans (Abs) 0.1 0.0 - 0.1 x10E3/uL  Hepatic function panel  Result Value Ref Range   Total Protein 6.7 6.0 - 8.5 g/dL   Albumin 4.5 3.8 - 4.8 g/dL   Bilirubin Total 0.9 0.0 - 1.2 mg/dL   Bilirubin, Direct 0.21 0.00 - 0.40 mg/dL   Alkaline Phosphatase 63 39 - 117 IU/L   AST 26 0 - 40 IU/L   ALT 32 0 - 44 IU/L  Lipid panel  Result Value Ref Range   Cholesterol, Total 195 100 - 199 mg/dL   Triglycerides 144 0 - 149 mg/dL   HDL 39 (L) >39 mg/dL   VLDL Cholesterol Cal 29 5 - 40 mg/dL   LDL Calculated 127 (H) 0 - 99 mg/dL   Chol/HDL Ratio 5.0 0.0 - 5.0 ratio       Pertinent labs & imaging results that were available during my care of the patient were reviewed by me and considered in my medical decision making.  Assessment & Plan:  Earnie was seen today for medical management of chronic issues, hyperlipidemia and hypertension.  Diagnoses and all orders for this visit:  Essential hypertension, benign BP poorly controlled. Changes were not made in regimen today. Pt to adjust diet and to report any persistent high readings. Daily blood pressure log given with instructions on how to fill out and told to bring to next visit. Gaol BP 130/80. Pt aware to report any persistent high or low readings. DASH diet and exercise encouraged. Exercise at least 150 minutes per week and increase as tolerated. Goal BMI > 25. Stress management encouraged. Smoking cessation discussed. Avoid excessive alcohol. Avoid NSAID's. Avoid more than 2000 mg of sodium daily. Medications as prescribed. Follow up as scheduled.  -     CMP14+EGFR -     lisinopril (ZESTRIL) 20 MG tablet; Take 1 tablet (20 mg total) by mouth daily.  Gastroesophageal reflux disease without esophagitis Diet controlled. May take Tums if needed, not often. Report any new or worsening symptoms.  Testosterone deficiency Has had increased fatigue. Will recheck testosterone level  today. Will adjust dose if warranted.  -     Testosterone,Free and Total -     testosterone cypionate (DEPOTESTOSTERONE CYPIONATE) 200 MG/ML injection; Inject 1 mL (200 mg total) into the muscle every 28 (twenty-eight) days.  Dyslipidemia Diet encouraged - increase intake of fresh fruits and vegetables, increase intake of lean proteins. Bake, broil, or grill foods. Avoid fried, greasy, and fatty foods. Avoid fast foods. Increase intake of fiber-rich whole grains.  Exercise encouraged - at least 150 minutes per week and advance as tolerated.  Goal BMI < 25. Pt will initiate Red Yeast Rice daily, declines statin therapy. Follow up in 3-6 months as discussed.  -     Lipid panel  Generalized anxiety disorder Well controlled with below. Will continue. Will check thyroid function today.  -     Thyroid Panel With TSH -     ALPRAZolam (XANAX) 0.25 MG tablet; TAKE 1 OR 2 TABLETS DAILY AS NEEDED -     escitalopram (LEXAPRO) 20 MG tablet; Take 1 tablet (20 mg total) by mouth daily.  Severe obesity (BMI >= 40) (HCC) Diet and exercise encouraged. BMI goal less than 25. Will check thyroid today.  -     Thyroid Panel With TSH  Depression, recurrent ([place]) Well controlled with below. Will check thyroid function today. Continue below. Report any new or worsening symptoms.  -     Thyroid Panel With TSH -     ALPRAZolam (XANAX) 0.25 MG tablet; TAKE 1 OR 2 TABLETS DAILY AS NEEDED -     escitalopram (LEXAPRO) 20 MG tablet; Take 1 tablet (20 mg total) by mouth daily.  Blood glucose elevated Last several blood sugar levels have been above 100. A1C checked today, normal at 5.7 today. Limit intake of sugary foods and drinks.  -     Bayer DCA Hb A1c Waived  Controlled substance agreement signed -     ToxASSURE Select 13 (MW), Urine  Vitamin D deficiency Labs pending. Continue repletion therapy. If indicated, will change repletion dosage. Eat foods rich in Vit D including milk, orange juice, yogurt with  vitamin D added, salmon or mackerel, canned tuna fish, cereals with vitamin D added, and cod liver oil. Get out in the sun but make sure to wear at least SPF 30 sunscreen.  -     Vitamin D, Ergocalciferol, (DRISDOL) 1.25 MG (50000 UT) CAPS capsule; Take 1 capsule (50,000 Units total) by mouth once a week. -     VITAMIN D 25 Hydroxy (Vit-D Deficiency, Fractures)     Continue all other maintenance medications.  Follow up plan: Return in about 6 months (around 05/25/2019), or if symptoms worsen or fail to improve, for HTN, Testosterone.  Continue healthy lifestyle choices, including diet (rich in fruits, vegetables, and lean proteins, and low in salt and simple carbohydrates) and exercise (at least 30 minutes of moderate physical activity daily).  Educational handout given for survey, COVID-19  The above assessment and management plan was discussed with the patient. The patient verbalized understanding of and has agreed to the management plan. Patient is aware to call the clinic if symptoms persist or worsen. Patient is aware when to return to the clinic for a follow-up visit. Patient educated on when it is appropriate to go to the emergency department.   Monia Pouch, FNP-C [facility] [phone] 11/22/18

## 2018-11-24 LAB — SPECIMEN STATUS

## 2018-11-25 LAB — CMP14+EGFR
ALT: 27 IU/L (ref 0–44)
AST: 24 IU/L (ref 0–40)
Albumin/Globulin Ratio: 1.6 (ref 1.2–2.2)
Albumin: 4.3 g/dL (ref 3.8–4.8)
Alkaline Phosphatase: 58 IU/L (ref 39–117)
BUN/Creatinine Ratio: 18 (ref 10–24)
BUN: 19 mg/dL (ref 8–27)
Bilirubin Total: 1 mg/dL (ref 0.0–1.2)
CO2: 23 mmol/L (ref 20–29)
Calcium: 9.4 mg/dL (ref 8.6–10.2)
Chloride: 105 mmol/L (ref 96–106)
Creatinine, Ser: 1.06 mg/dL (ref 0.76–1.27)
GFR calc Af Amer: 85 mL/min/{1.73_m2} (ref >59)
GFR calc non Af Amer: 73 mL/min/{1.73_m2} (ref >59)
Globulin, Total: 2.7 g/dL (ref 1.5–4.5)
Glucose: 117 mg/dL — ABNORMAL HIGH (ref 65–99)
Potassium: 4.7 mmol/L (ref 3.5–5.2)
Sodium: 141 mmol/L (ref 134–144)
Total Protein: 7 g/dL (ref 6.0–8.5)

## 2018-11-25 LAB — THYROID PANEL WITH TSH
Free Thyroxine Index: 1.5 (ref 1.2–4.9)
T3 Uptake Ratio: 26 % (ref 24–39)
T4, Total: 5.8 ug/dL (ref 4.5–12.0)
TSH: 1.88 u[IU]/mL (ref 0.450–4.500)

## 2018-11-25 LAB — LIPID PANEL
Chol/HDL Ratio: 4.7 ratio (ref 0.0–5.0)
Cholesterol, Total: 183 mg/dL (ref 100–199)
HDL: 39 mg/dL — ABNORMAL LOW (ref >39)
LDL Calculated: 118 mg/dL — ABNORMAL HIGH (ref 0–99)
Triglycerides: 132 mg/dL (ref 0–149)
VLDL Cholesterol Cal: 26 mg/dL (ref 5–40)

## 2018-11-25 LAB — TESTOSTERONE,FREE AND TOTAL
Testosterone, Free: 6.6 pg/mL (ref 6.6–18.1)
Testosterone: 272 ng/dL (ref 264–916)

## 2018-11-25 LAB — VITAMIN D 25 HYDROXY (VIT D DEFICIENCY, FRACTURES): Vit D, 25-Hydroxy: 38.6 ng/mL (ref 30.0–100.0)

## 2018-11-26 LAB — TOXASSURE SELECT 13 (MW), URINE

## 2018-12-04 ENCOUNTER — Other Ambulatory Visit: Payer: Self-pay

## 2018-12-04 ENCOUNTER — Ambulatory Visit (INDEPENDENT_AMBULATORY_CARE_PROVIDER_SITE_OTHER): Payer: Medicare Other | Admitting: *Deleted

## 2018-12-04 DIAGNOSIS — E349 Endocrine disorder, unspecified: Secondary | ICD-10-CM | POA: Diagnosis not present

## 2018-12-04 NOTE — Progress Notes (Signed)
Pt given Testosterone inj Tolerated well 

## 2018-12-21 DIAGNOSIS — Z23 Encounter for immunization: Secondary | ICD-10-CM | POA: Diagnosis not present

## 2019-01-07 ENCOUNTER — Ambulatory Visit (INDEPENDENT_AMBULATORY_CARE_PROVIDER_SITE_OTHER): Payer: Medicare Other

## 2019-01-07 ENCOUNTER — Other Ambulatory Visit: Payer: Self-pay

## 2019-01-07 DIAGNOSIS — E349 Endocrine disorder, unspecified: Secondary | ICD-10-CM

## 2019-01-07 MED ORDER — TESTOSTERONE CYPIONATE 200 MG/ML IM SOLN: 200.0000 mg | INTRAMUSCULAR | 1 refills | Status: DC

## 2019-01-07 NOTE — Progress Notes (Signed)
Testosterone injection given to left upper outer quadrant.  Patient tolerated well. 

## 2019-01-14 ENCOUNTER — Ambulatory Visit: Payer: Medicare Other

## 2019-02-04 ENCOUNTER — Ambulatory Visit (INDEPENDENT_AMBULATORY_CARE_PROVIDER_SITE_OTHER): Payer: Medicare Other | Admitting: *Deleted

## 2019-02-04 DIAGNOSIS — Z0000 Encounter for general adult medical examination without abnormal findings: Secondary | ICD-10-CM | POA: Diagnosis not present

## 2019-02-04 NOTE — Patient Instructions (Signed)
Preventive Care 66 Years and Older, Male Preventive care refers to lifestyle choices and visits with your health care provider that can promote health and wellness. This includes:  A yearly physical exam. This is also called an annual well check.  Regular dental and eye exams.  Immunizations.  Screening for certain conditions.  Healthy lifestyle choices, such as diet and exercise. What can I expect for my preventive care visit? Physical exam Your health care provider will check:  Height and weight. These may be used to calculate body mass index (BMI), which is a measurement that tells if you are at a healthy weight.  Heart rate and blood pressure.  Your skin for abnormal spots. Counseling Your health care provider may ask you questions about:  Alcohol, tobacco, and drug use.  Emotional well-being.  Home and relationship well-being.  Sexual activity.  Eating habits.  History of falls.  Memory and ability to understand (cognition).  Work and work Statistician. What immunizations do I need?  Influenza (flu) vaccine  This is recommended every year. Tetanus, diphtheria, and pertussis (Tdap) vaccine  You may need a Td booster every 10 years. Varicella (chickenpox) vaccine  You may need this vaccine if you have not already been vaccinated. Zoster (shingles) vaccine  You may need this after age 50. Pneumococcal conjugate (PCV13) vaccine  One dose is recommended after age 24. Pneumococcal polysaccharide (PPSV23) vaccine  One dose is recommended after age 33. Measles, mumps, and rubella (MMR) vaccine  You may need at least one dose of MMR if you were born in 1957 or later. You may also need a second dose. Meningococcal conjugate (MenACWY) vaccine  You may need this if you have certain conditions. Hepatitis A vaccine  You may need this if you have certain conditions or if you travel or work in places where you may be exposed to hepatitis A. Hepatitis B vaccine   You may need this if you have certain conditions or if you travel or work in places where you may be exposed to hepatitis B. Haemophilus influenzae type b (Hib) vaccine  You may need this if you have certain conditions. You may receive vaccines as individual doses or as more than one vaccine together in one shot (combination vaccines). Talk with your health care provider about the risks and benefits of combination vaccines. What tests do I need? Blood tests  Lipid and cholesterol levels. These may be checked every 5 years, or more frequently depending on your overall health.  Hepatitis C test.  Hepatitis B test. Screening  Lung cancer screening. You may have this screening every year starting at age 66 if you have a 30-pack-year history of smoking and currently smoke or have quit within the past 15 years.  Colorectal cancer screening. All adults should have this screening starting at age 66 and continuing until age 54. Your health care provider may recommend screening at age 47 if you are at increased risk. You will have tests every 1-10 years, depending on your results and the type of screening test.  Prostate cancer screening. Recommendations will vary depending on your family history and other risks.  Diabetes screening. This is done by checking your blood sugar (glucose) after you have not eaten for a while (fasting). You may have this done every 1-3 years.  Abdominal aortic aneurysm (AAA) screening. You may need this if you are a current or former smoker.  Sexually transmitted disease (STD) testing. Follow these instructions at home: Eating and drinking  Eat  a diet that includes fresh fruits and vegetables, whole grains, lean protein, and low-fat dairy products. Limit your intake of foods with high amounts of sugar, saturated fats, and salt.  Take vitamin and mineral supplements as recommended by your health care provider.  Do not drink alcohol if your health care provider  tells you not to drink.  If you drink alcohol: ? Limit how much you have to 0-2 drinks a day. ? Be aware of how much alcohol is in your drink. In the [place] one drink equals one 12 oz bottle of beer (355 mL), one 5 oz glass of wine (148 mL), or one 1 oz glass of hard liquor (44 mL). Lifestyle  Take daily care of your teeth and gums.  Stay active. Exercise for at least 30 minutes on 5 or more days each week.  Do not use any products that contain nicotine or tobacco, such as cigarettes, e-cigarettes, and chewing tobacco. If you need help quitting, ask your health care provider.  If you are sexually active, practice safe sex. Use a condom or other form of protection to prevent STIs (sexually transmitted infections).  Talk with your health care provider about taking a low-dose aspirin or statin. What's next?  Visit your health care provider once a year for a well check visit.  Ask your health care provider how often you should have your eyes and teeth checked.  Stay up to date on all vaccines. This information is not intended to replace advice given to you by your health care provider. Make sure you discuss any questions you have with your health care provider. Document Released: 04/16/2015 Document Revised: 03/14/2018 Document Reviewed: 03/14/2018 Elsevier Patient Education  2020 [employer]  

## 2019-02-04 NOTE — Progress Notes (Addendum)
MEDICARE ANNUAL WELLNESS VISIT  02/04/2019  Telephone Visit Disclaimer This Medicare AWV was conducted by telephone due to national recommendations for restrictions regarding the COVID-19 Pandemic (e.g. social distancing).  I verified, using two identifiers, that I am speaking with Arthur Foster or their authorized healthcare agent. I discussed the limitations, risks, security, and privacy concerns of performing an evaluation and management service by telephone and the potential availability of an in-person appointment in the future. The patient expressed understanding and agreed to proceed.   Subjective:  Arthur Foster is a 68 y.o. male patient of Rakes, Connye Burkitt, FNP who had a Medicare Annual Wellness Visit today via telephone. 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 he has 3 children. he reports that he is socially active and does interact with friends/family regularly. he is moderately physically active and enjoys doing yard work, visiting with friends and family and staying active outside.  Patient Care Team: Baruch Gouty, FNP as PCP - General (Family Medicine)  Advanced Directives 02/04/2019  Does Patient Have a Medical Advance Directive? Yes  Type of Paramedic of [place];Living will  Does patient want to make changes to medical advance directive? No - Patient declined  Copy of [place] in Chart? No - copy requested    Hospital Utilization Over the Past 12 Months: # of hospitalizations or ER visits: 0 # of surgeries: 0  Review of Systems    Patient reports that his overall health is better compared to last year.  History obtained from chart review  Patient Reported Readings (BP, Pulse, CBG, Weight, etc) none  Pain Assessment Pain : No/denies pain     Current Medications & Allergies (verified) Allergies as of 02/04/2019      Reactions   Ambien [zolpidem Tartrate]  Other (See Comments)      Medication List       Accurate as of February 04, 2019  9:18 AM. If you have any questions, ask your nurse or doctor.        STOP taking these medications   mometasone 0.1 % cream Commonly known as: ELOCON     TAKE these medications   ALPRAZolam 0.25 MG tablet Commonly known as: XANAX TAKE 1 OR 2 TABLETS DAILY AS NEEDED   cetirizine 10 MG tablet Commonly known as: ZYRTEC TAKE 1 TABLET DAILY   escitalopram 20 MG tablet Commonly known as: LEXAPRO Take 1 tablet (20 mg total) by mouth daily.   lisinopril 20 MG tablet Commonly known as: ZESTRIL Take 1 tablet (20 mg total) by mouth daily.   naproxen 500 MG tablet Commonly known as: NAPROSYN Take 500 mg by mouth 2 (two) times daily as needed.   testosterone cypionate 200 MG/ML injection Commonly known as: DEPOTESTOSTERONE CYPIONATE Inject 1 mL (200 mg total) into the muscle every 28 (twenty-eight) days.   vitamin C 1000 MG tablet Take 1,000 mg by mouth daily.   Vitamin D (Ergocalciferol) 1.25 MG (50000 UT) Caps capsule Commonly known as: DRISDOL Take 1 capsule (50,000 Units total) by mouth once a week.       History (reviewed): Past Medical History:  Diagnosis Date  . Anxiety   . BPH (benign prostatic hyperplasia)   . Depression   . Hyperlipidemia   . Hypertension   . Testosterone deficiency    Past Surgical History:  Procedure Laterality Date  . FOOT SURGERY    . HERNIA  REPAIR Left inguinal   Family History  Problem Relation Age of Onset  . Hypertension Mother   . Alzheimer's disease Mother   . Hypertension Father   . Alzheimer's disease Father    Social History   Socioeconomic History  . Marital status: Married    Spouse name: [name]  . Number of children: 3  . Years of education: 65  . Highest education level: High school graduate  Occupational History  . Occupation: retired    Comment: works a few hours a week  Social Needs  . Financial resource strain: Not hard  at all  . Food insecurity    Worry: Never true    Inability: Never true  . Transportation needs    Medical: No    Non-medical: No  Tobacco Use  . Smoking status: Former Smoker    Types: Cigarettes    Quit date: 1998    Years since quitting: 22.8  . Smokeless tobacco: Former Systems developer    Types: [place] date: 2002  Substance and Sexual Activity  . Alcohol use: Yes    Alcohol/week: 14.0 standard drinks    Types: 14 Cans of beer per week  . Drug use: No  . Sexual activity: Yes    Birth control/protection: None  Lifestyle  . Physical activity    Days per week: 0 days    Minutes per session: 0 min  . Stress: Only a little  Relationships  . Social connections    Talks on phone: More than three times a week    Gets together: More than three times a week    Attends religious service: Never    Active member of club or organization: No    Attends meetings of clubs or organizations: Never    Relationship status: Married  Other Topics Concern  . Not on file  Social History Narrative  . Not on file    Activities of Daily Living In your present state of health, do you have any difficulty performing the following activities: 02/04/2019  Hearing? Y  Comment doesn't hear as good out of his left ear over the past few years  Vision? N  Comment wears otc reading glasses when reading the computer or doing paperwork-gets yearly eye exams  Difficulty concentrating or making decisions? N  Walking or climbing stairs? N  Dressing or bathing? N  Doing errands, shopping? N  Preparing Food and eating ? N  Using the Toilet? N  In the past six months, have you accidently leaked urine? N  Do you have problems with loss of bowel control? N  Managing your Medications? N  Managing your Finances? N  Housekeeping or managing your Housekeeping? N  Some recent data might be hidden    Patient Education/ Literacy How often do you need to have someone help you when you read instructions, pamphlets,  or other written materials from your doctor or pharmacy?: 1 - Never What is the last grade level you completed in school?: 12th grade  Exercise Current Exercise Habits: The patient does not participate in regular exercise at present, Exercise limited by: orthopedic condition(s)  Diet Patient reports consuming 2 meals a day and 1 snack(s) a day Patient reports that his primary diet is: Regular Patient reports that she does have regular access to food.   Depression Screen PHQ 2/9 Scores 02/04/2019 11/22/2018 09/20/2018 06/07/2018 05/24/2018 04/01/2018 11/21/2017  PHQ - 2 Score 0 0 0 0 0 0 0  Fall Risk Fall Risk  02/04/2019 11/22/2018 09/20/2018 06/07/2018 05/24/2018  Falls in the past year? 0 0 0 1 0  Number falls in past yr: - - - 1 -  Injury with Fall? - - - 0 -     Objective:  Arthur Foster seemed alert and oriented and he participated appropriately during our telephone visit.  Blood Pressure Weight BMI  BP Readings from Last 3 Encounters:  11/22/18 (!) 147/82  09/20/18 138/78  06/07/18 (!) 141/75   Wt Readings from Last 3 Encounters:  11/22/18 (!) 320 lb (145.2 kg)  09/20/18 (!) 318 lb (144.2 kg)  06/07/18 (!) 319 lb (144.7 kg)   BMI Readings from Last 1 Encounters:  11/22/18 42.22 kg/m    *Unable to obtain current vital signs, weight, and BMI due to telephone visit type  Hearing/Vision  . Hudson did not seem to have difficulty with hearing/understanding during the telephone conversation . Reports that he has had a formal eye exam by an eye care professional within the past year . Reports that he has not had a formal hearing evaluation within the past year *Unable to fully assess hearing and vision during telephone visit type  Cognitive Function: 6CIT Screen 02/04/2019  What Year? 0 points  What month? 0 points  What time? 0 points  Count back from 20 0 points  Months in reverse 0 points  Repeat phrase 0 points  Total Score 0   (Normal:0-7, Significant for Dysfunction:  >8)  Normal Cognitive Function Screening: Yes   Immunization & Health Maintenance Record Immunization History  Administered Date(s) Administered  . Fluad Quad(high Dose 65+) 12/21/2018  . Influenza Whole 03/03/2012  . Influenza,inj,Quad PF,6+ Mos 01/08/2014, 03/06/2016  . Influenza-Unspecified 01/11/2018, 12/13/2018  . Tdap 12/03/2011  . Zoster Recombinat (Shingrix) 12/23/2018    Health Maintenance  Topic Date Due  . Hepatitis C Screening  March 27, 1951  . COLON CANCER SCREENING ANNUAL FOBT  06/11/2013  . PNA vac Low Risk Adult (1 of 2 - PCV13) 01/08/2018  . TETANUS/TDAP  12/02/2021  . COLONOSCOPY  07/19/2022  . INFLUENZA VACCINE  Completed       Assessment  This is a routine wellness examination for Xzavier Neitzke.  Health Maintenance: Due or Overdue Health Maintenance Due  Topic Date Due  . Hepatitis C Screening  1950-10-14  . COLON CANCER SCREENING ANNUAL FOBT  06/11/2013  . PNA vac Low Risk Adult (1 of 2 - PCV13) 01/08/2018    Arthur Foster does not need a referral for Community Assistance: Care Management:   no Social Work:    no Prescription Assistance:  no Nutrition/Diabetes Education:  no   Plan:  Personalized Goals Goals Addressed            This Visit's Progress   . DIET - INCREASE WATER INTAKE       Try to drink 6-8 glasses of water daily       Personalized Health Maintenance & Screening Recommendations  Pneumococcal vaccine  Colorectal cancer screening Advanced directives: has an advanced directive - a copy HAS NOT been provided.  Lung Cancer Screening Recommended: no (Low Dose CT Chest recommended if Age 23-80 years, 30 pack-year currently smoking OR have quit w/in past 15 years) Hepatitis C Screening recommended: yes-will offer at next visit with PCP HIV Screening recommended: no  Advanced Directives: Written information was not prepared per patient's request.  Referrals & Orders No orders of the defined types were placed in this encounter.    Follow-up  Plan . Follow-up with Baruch Gouty, FNP as planned . Consider Prevnar13 vaccine at your next visit with your PCP . Bring a copy of your Advanced Directives in for our records   I have personally reviewed and noted the following in the patient's chart:   . Medical and social history . Use of alcohol, tobacco or illicit drugs  . Current medications and supplements . Functional ability and status . Nutritional status . Physical activity . Advanced directives . List of other physicians . Hospitalizations, surgeries, and ER visits in previous 12 months . Vitals . Screenings to include cognitive, depression, and falls . Referrals and appointments  In addition, I have reviewed and discussed with Arthur Foster certain preventive protocols, quality metrics, and best practice recommendations. A written personalized care plan for preventive services as well as general preventive health recommendations is available and can be mailed to the patient at his request.      Milas Hock, LPN  [id number]  I have reviewed and agree with the above AWV documentation.   Mary-Margaret Hassell Done, FNP

## 2019-02-12 ENCOUNTER — Other Ambulatory Visit: Payer: Self-pay

## 2019-02-12 DIAGNOSIS — Z20822 Contact with and (suspected) exposure to covid-19: Secondary | ICD-10-CM

## 2019-02-12 DIAGNOSIS — Z20828 Contact with and (suspected) exposure to other viral communicable diseases: Secondary | ICD-10-CM | POA: Diagnosis not present

## 2019-02-13 IMAGING — DX DG CHEST 2V
2 series · 2 of 2 positions shown · non-contrast
Comparison: Chest x-ray dated 11/05/2014.

CLINICAL DATA: Hypertension

EXAM:
CHEST  2 VIEW

[chest pa]
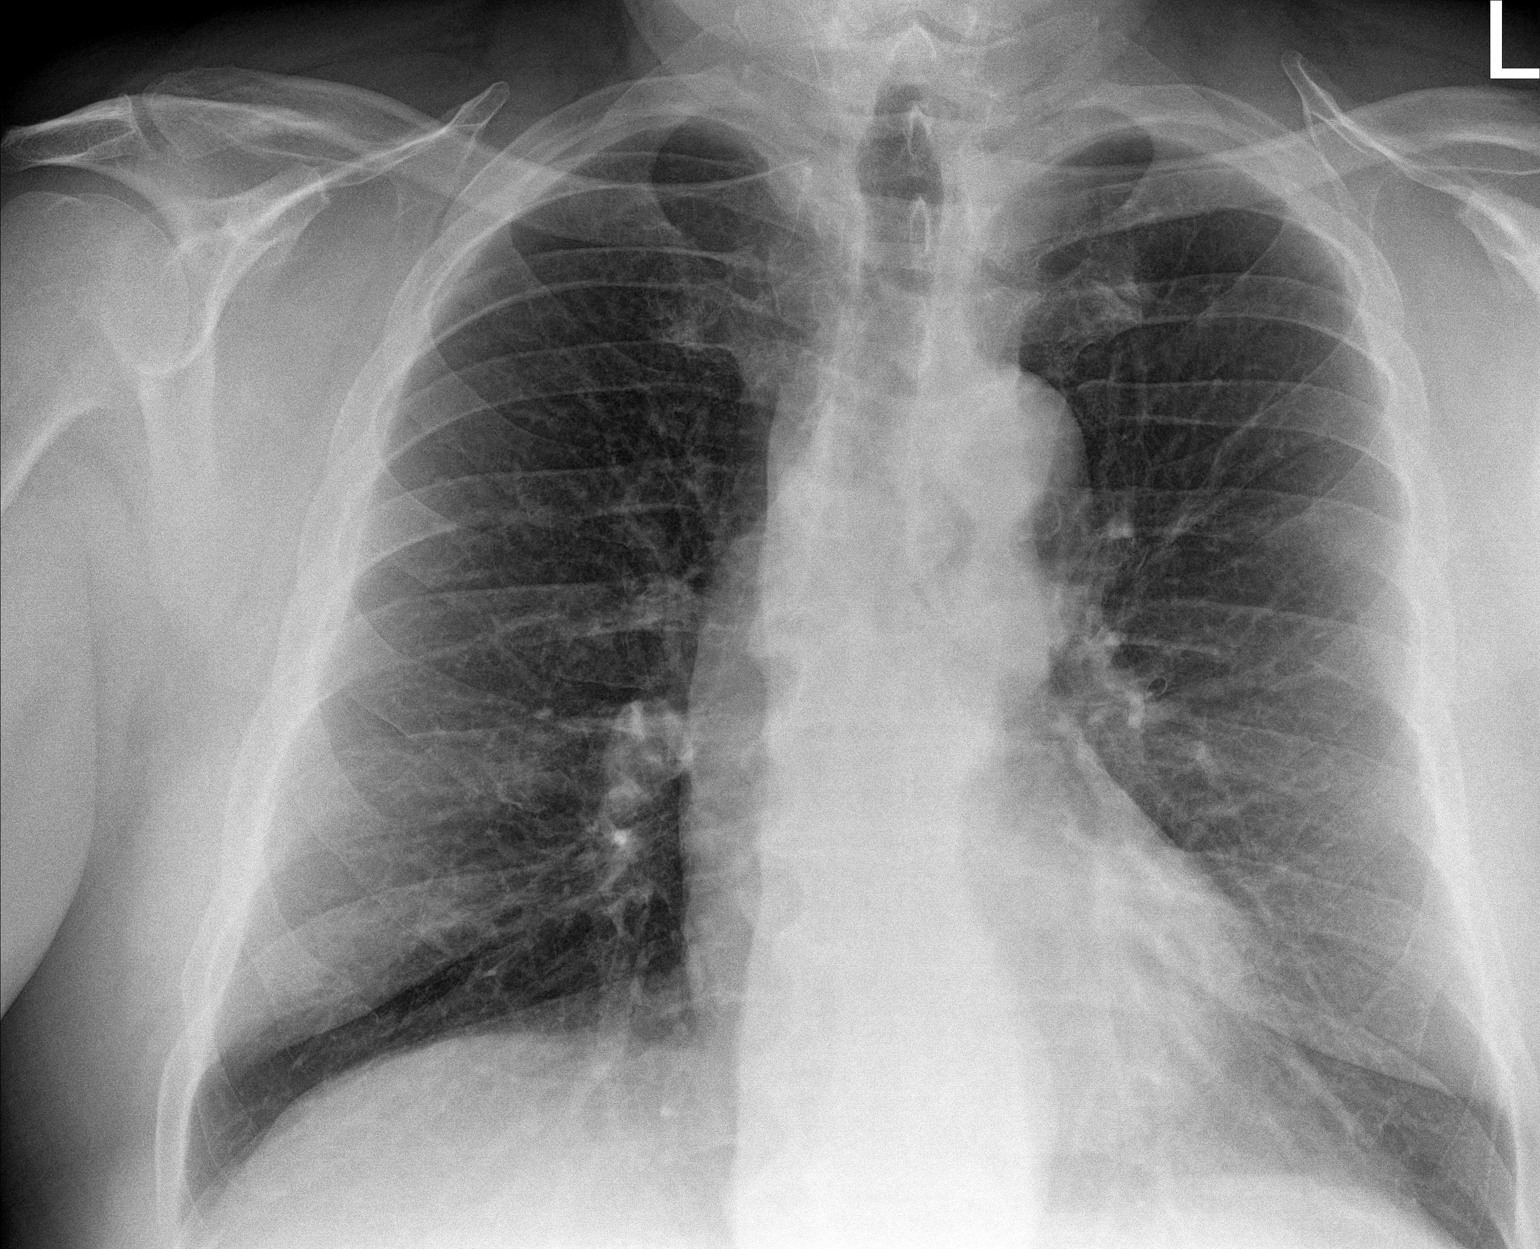

[chest lat]
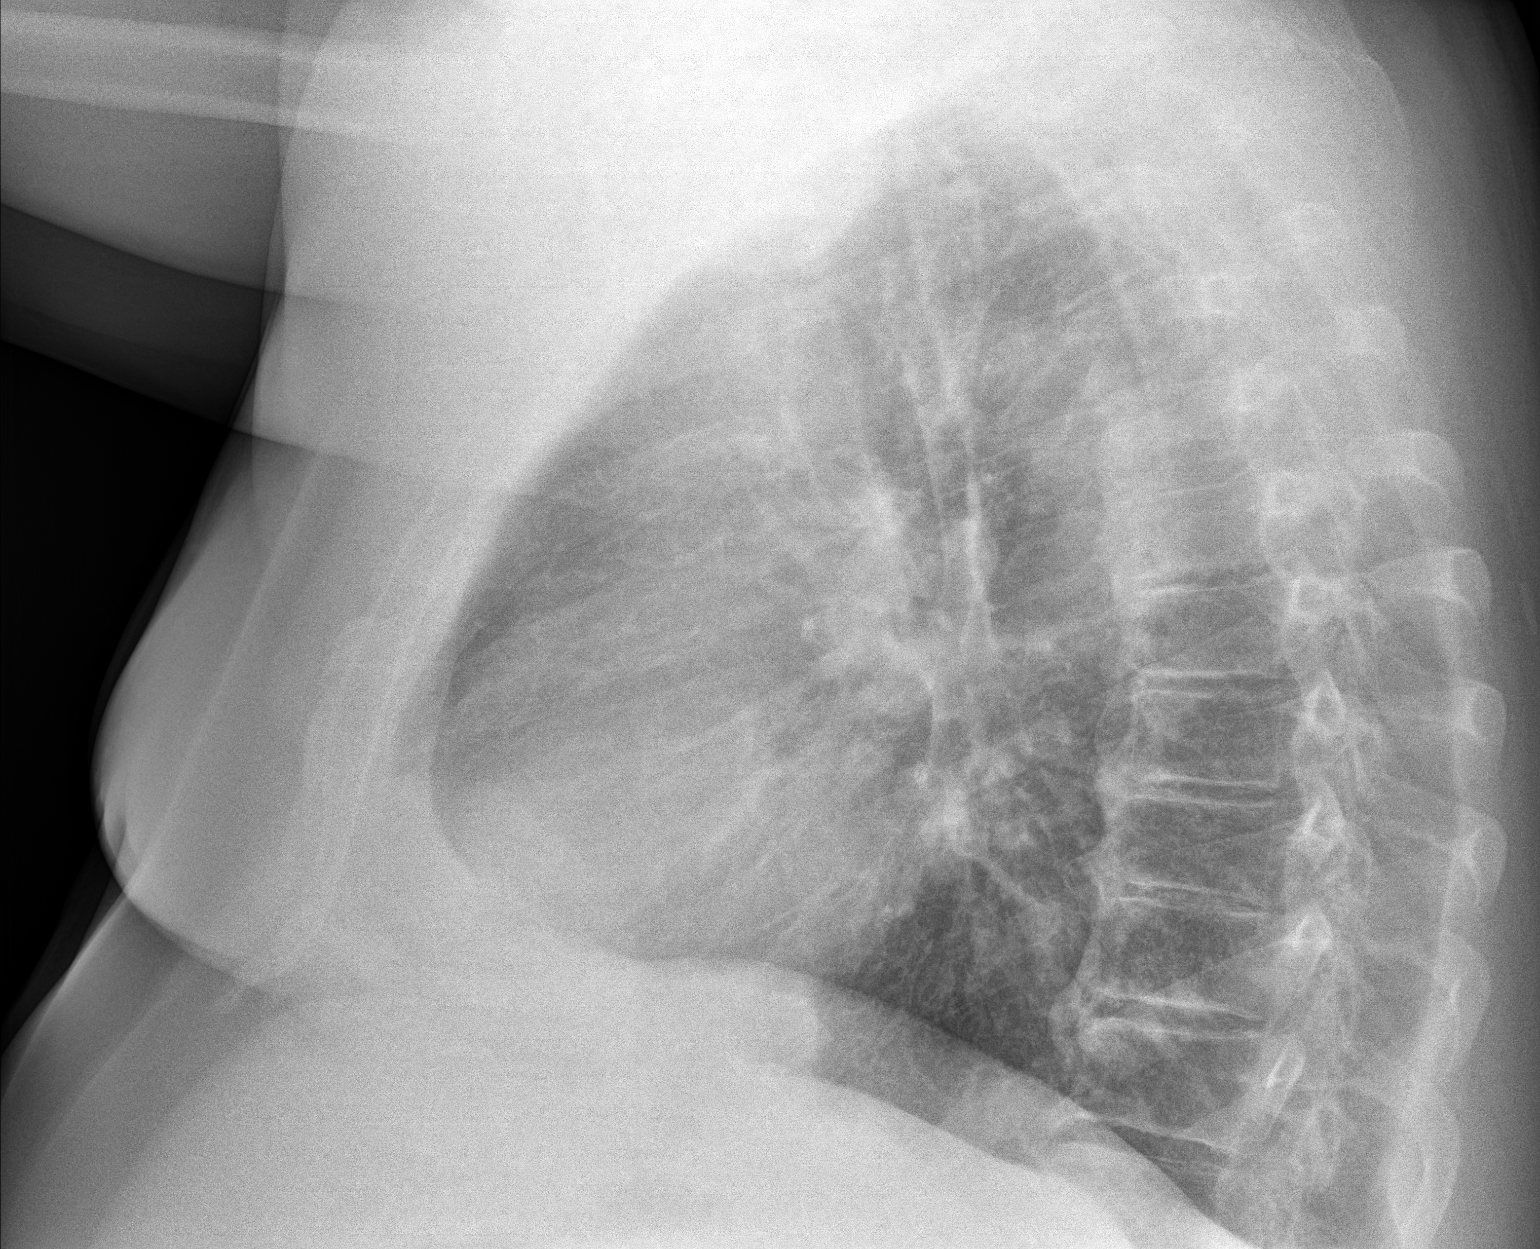

[2 of 2 positions shown; findings below may reference images not displayed]

FINDINGS: Heart size and mediastinal contours are stable. Lungs are clear. No
pleural effusion or pneumothorax seen.

Mild degenerative spurring and ankylosis within the mid and lower
thoracic spine. No acute or suspicious osseous finding.
IMPRESSION: No active cardiopulmonary disease. No evidence of pneumonia or
pulmonary edema.

## 2019-02-14 LAB — NOVEL CORONAVIRUS, NAA: SARS-CoV-2, NAA: NOT DETECTED

## 2019-02-17 ENCOUNTER — Other Ambulatory Visit: Payer: Self-pay | Admitting: *Deleted

## 2019-02-17 MED ORDER — CETIRIZINE HCL 10 MG PO TABS: 10.0000 mg | ORAL_TABLET | Freq: Every day | ORAL | 1 refills | Status: DC

## 2019-02-25 ENCOUNTER — Ambulatory Visit (INDEPENDENT_AMBULATORY_CARE_PROVIDER_SITE_OTHER): Payer: Medicare Other

## 2019-02-25 DIAGNOSIS — E349 Endocrine disorder, unspecified: Secondary | ICD-10-CM | POA: Diagnosis not present

## 2019-03-24 ENCOUNTER — Other Ambulatory Visit: Payer: Self-pay

## 2019-03-24 ENCOUNTER — Ambulatory Visit (INDEPENDENT_AMBULATORY_CARE_PROVIDER_SITE_OTHER): Payer: Medicare Other | Admitting: *Deleted

## 2019-03-24 DIAGNOSIS — E349 Endocrine disorder, unspecified: Secondary | ICD-10-CM | POA: Diagnosis not present

## 2019-03-25 DIAGNOSIS — M7661 Achilles tendinitis, right leg: Secondary | ICD-10-CM | POA: Diagnosis not present

## 2019-03-25 DIAGNOSIS — M79671 Pain in right foot: Secondary | ICD-10-CM | POA: Diagnosis not present

## 2019-04-01 ENCOUNTER — Ambulatory Visit: Payer: Medicare Other | Attending: Orthopedic Surgery | Admitting: Physical Therapy

## 2019-04-01 ENCOUNTER — Encounter: Payer: Self-pay | Admitting: Physical Therapy

## 2019-04-01 ENCOUNTER — Other Ambulatory Visit: Payer: Self-pay

## 2019-04-01 DIAGNOSIS — M25671 Stiffness of right ankle, not elsewhere classified: Secondary | ICD-10-CM | POA: Diagnosis not present

## 2019-04-01 DIAGNOSIS — M25571 Pain in right ankle and joints of right foot: Secondary | ICD-10-CM | POA: Diagnosis not present

## 2019-04-01 NOTE — Therapy (Signed)
[facility] [place], [place], [address] Phone: [phone]   Fax:  [phone]  Physical Therapy Evaluation  Patient Details  Name: Arthur Foster MRN: [id number] Date of Birth: [birth year] Referring Provider (PT): Victorino December MD   Encounter Date: 04/01/2019  PT End of Session - 04/01/19 0909    Visit Number  1    Number of Visits  12    Date for PT Re-Evaluation  04/28/19    Authorization Type  FOTO.    PT Start Time  0815    PT Stop Time  0903    PT Time Calculation (min)  48 min    Activity Tolerance  Patient tolerated treatment well    Behavior During Therapy  [facility] for tasks assessed/performed       Past Medical History:  Diagnosis Date  . Anxiety   . BPH (benign prostatic hyperplasia)   . Depression   . Hyperlipidemia   . Hypertension   . Testosterone deficiency     Past Surgical History:  Procedure Laterality Date  . FOOT SURGERY    . HERNIA REPAIR Left inguinal    There were no vitals filed for this visit.   Subjective Assessment - 04/01/19 1012    Subjective  COVID-19 screen performed prior to patient entering clinic.  The patient reports right heel pain that has been progressively worsening over the last year.  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  He has left knee pain and feels this has contributed to his right heel pain.    Pertinent History  HTN, left knee pain, foot surgery.    Limitations  Standing    How long can you stand comfortably?  Over an hour.    Patient Stated Goals  Get out of pain.    Currently in Pain?  Yes    Pain Score  2     Pain Location  Heel    Pain Orientation  Right    Pain Descriptors / Indicators  Aching    Pain Type  Chronic pain    Pain Onset  More than a month ago    Pain Frequency  Constant    Aggravating Factors   Ladders.    Pain Relieving Factors  Rest.         [facility] PT Assessment -  04/01/19 0001      Assessment   Medical Diagnosis  Right Achilles tendonitis.    Referring Provider (PT)  Victorino December MD    Onset Date/Surgical Date  --   ~one year.     Precautions   Precautions  None      Restrictions   Weight Bearing Restrictions  No      Balance Screen   Has the patient fallen in the past 6 months  Yes    How many times?  --   1.   Has the patient had a decrease in activity level because of a fear of falling?   No    Is the patient reluctant to leave their home because of a fear of falling?   No      Home Environment   Living Environment  Private residence      Prior Function   Level of Independence  Independent      Observation/Other Assessments   Observations  --   "Swollen" area on Achilles.   Focus on Therapeutic Outcomes (  FOTO)   44% limitation.      ROM / Strength   AROM / PROM / Strength  AROM;Strength      AROM   Overall AROM Comments  Right active ankle dorsiflexion 5 degrees with knee in full extension and 10 degrees with knee flexed.      Strength   Overall Strength Comments  Normal right ankle strength.      Palpation   Palpation comment  Tender to palpation diffusely over patient's right Achilles attachment on the calcaneus.  Tendet at base of right 5th metatarsal.      Ambulation/Gait   Gait Comments  Limpage gait wit decreased stance time over right LE.                Objective measurements completed on examination: See above findings.      [facility] Adult PT Treatment/Exercise - 04/01/19 0001      Modalities   Modalities  Electrical Stimulation;Vasopneumatic      Electrical Stimulation   Electrical Stimulation Location  Right distal Achilles    Electrical Stimulation Action  Pre-mod.    Electrical Stimulation Parameters  80-150 Hz x 20 minutes.    Electrical Stimulation Goals  Pain      Vasopneumatic   Number Minutes Vasopneumatic   20 minutes    Vasopnuematic Location   --   Right ankle.   Vasopneumatic  Pressure  Medium                  PT Long Term Goals - 04/01/19 1100      PT LONG TERM GOAL #1   Title  ind with a HEP.    Time  4    Period  Weeks    Status  New      PT LONG TERM GOAL #2   Title  Perform ADL's with pain not > 3/10.    Time  4    Period  Weeks    Status  New             Plan - 04/01/19 1023    Clinical Impression Statement  The patient presents to the clinic with c/o right heel pain that has been ongoing for about a year.  He is diffusely tender to palpation over his Achilles near the calcaneal attachment.  He has tightness into dorsiflexion.  He has a limpage gait pattern with decreased stance time over his right LE.Patient will benefit from skilled physical therapy intervention to address deficits and pain.    Personal Factors and Comorbidities  Comorbidity 1    Comorbidities  HTN, left knee pain, foot surgery.    Examination-Activity Limitations  Locomotion Level;Other    Examination-Participation Restrictions  Other    Stability/Clinical Decision Making  Evolving/Moderate complexity    Clinical Decision Making  Low    Rehab Potential  Good    PT Frequency  3x / week    PT Duration  4 weeks    PT Treatment/Interventions  ADLs/Self Care Home Management;Cryotherapy;Electrical Stimulation;Ultrasound;Moist Heat;Iontophoresis 4mg /ml Dexamethasone;Therapeutic activities;Therapeutic exercise;Manual techniques;Patient/family education;Passive range of motion;Dry needling;Vasopneumatic Device    PT Next Visit Plan  IASTM to right Achilles, Combo e'stim/U/S, rockerboard.  Ionto.  Vaso and e'stim.    Consulted and Agree with Plan of Care  Patient       Patient will benefit from skilled therapeutic intervention in order to improve the following deficits and impairments:  Pain, Decreased range of motion  Visit Diagnosis: Pain in right ankle and  joints of right foot - Plan: PT plan of care cert/re-cert  Stiffness of right ankle, not elsewhere  classified - Plan: PT plan of care cert/re-cert     Problem List Patient Active Problem List   Diagnosis Date Noted  . Controlled substance agreement signed 11/22/2018  . Blood glucose elevated 11/22/2018  . Vitamin D deficiency 11/22/2018  . Sensorineural hearing loss (SNHL), bilateral 06/17/2018  . 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  . Dyslipidemia 08/08/2017  . Abnormal EKG 08/08/2017  . Cervical somatic dysfunction 03/06/2016  . 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 Generalized anxiety disorder 03/06/2016  . Left knee pain 11/05/2014  . Allergic rhinitis 06/18/2013  . Severe obesity (BMI >= 40) ([place]) 06/18/2013  . BPH (benign prostatic hypertrophy) 07/03/2012  . Essential hypertension, benign 07/03/2012  . Depression, recurrent ([place]) 07/03/2012  . GERD (gastroesophageal reflux disease) 07/03/2012  . Testosterone deficiency 07/03/2012    Hadi Dubin, [name] 04/01/2019, 11:02 AM  [facility] [address] [place], [place], [address] Phone: [phone]   Fax:  [phone]  Name: Arthur Foster MRN: [id number] Date of Birth: 1950-10-14

## 2019-04-03 ENCOUNTER — Ambulatory Visit: Payer: Medicare Other | Admitting: Physical Therapy

## 2019-04-03 ENCOUNTER — Other Ambulatory Visit: Payer: Self-pay

## 2019-04-03 DIAGNOSIS — M25671 Stiffness of right ankle, not elsewhere classified: Secondary | ICD-10-CM

## 2019-04-03 DIAGNOSIS — M25571 Pain in right ankle and joints of right foot: Secondary | ICD-10-CM

## 2019-04-03 NOTE — Therapy (Signed)
[facility] [place], [place], [address] Phone: [phone]   Fax:  [phone]  Physical Therapy Treatment  Patient Details  Name: Arthur Foster MRN: [id number] Date of Birth: 1951-03-24 Referring Provider (PT): Victorino December MD   Encounter Date: 04/03/2019  PT End of Session - 04/03/19 0950    Visit Number  2    Number of Visits  12    Date for PT Re-Evaluation  04/28/19    Authorization Type  FOTO.    PT Start Time  0815    PT Stop Time  0909    PT Time Calculation (min)  54 min    Activity Tolerance  Patient tolerated treatment well    Behavior During Therapy  [facility] for tasks assessed/performed       Past Medical History:  Diagnosis Date  . Anxiety   . BPH (benign prostatic hyperplasia)   . Depression   . Hyperlipidemia   . Hypertension   . Testosterone deficiency     Past Surgical History:  Procedure Laterality Date  . FOOT SURGERY    . HERNIA REPAIR Left inguinal    There were no vitals filed for this visit.  Subjective Assessment - 04/03/19 0856    Subjective  COVID-19 screen performed prior to patient entering clinic.  I felt better after that treatment.    Pertinent History  HTN, left knee pain, foot surgery.    Limitations  Standing    How long can you stand comfortably?  Over an hour.    Patient Stated Goals  Get out of pain.    Currently in Pain?  Yes    Pain Score  2     Pain Location  Heel    Pain Orientation  Right    Pain Descriptors / Indicators  Aching                       [facility] Adult PT Treatment/Exercise - 04/03/19 0001      Modalities   Modalities  Electrical Stimulation;Iontophoresis;Ultrasound;Vasopneumatic      Acupuncturist Location  RT distal Achilles.    Electrical Stimulation Action  Pre-mod.    Electrical Stimulation Parameters  80-150 Hz x 15 minutes.      Ultrasound   Ultrasound Location  RT distal Achilles    Ultrasound Parameters  Combo e'stim/U/S at 1.50 w/CM2 x 8 minutes.in prone.      Iontophoresis   Type of Iontophoresis  Dexamethasone    Location  Right distal Achilles.    Dose  80 mA-min    Time  8      Vasopneumatic   Number Minutes Vasopneumatic   15 minutes    Vasopnuematic Location   --   RT ankle.   Vasopneumatic Pressure  Medium      Manual Therapy   Manual Therapy  Soft tissue mobilization    Soft tissue mobilization  In prone:  IASTM x 8 minutes to right distal Achilles and calf musculature.                  PT Long Term Goals - 04/01/19 1100      PT LONG TERM GOAL #1   Title  ind with a HEP.    Time  4    Period  Weeks    Status  New      PT LONG TERM GOAL #2   Title  Perform ADL's with pain  not > 3/10.    Time  4    Period  Weeks    Status  New            Plan - 04/03/19 0948    Clinical Impression Statement  Patient did very well with treatment today.  He stated he felt better even after his first vist.    Personal Factors and Comorbidities  Comorbidity 1    Comorbidities  HTN, left knee pain, foot surgery.    Examination-Activity Limitations  Locomotion Level;Other    Examination-Participation Restrictions  Other    Stability/Clinical Decision Making  Evolving/Moderate complexity    Rehab Potential  Good    PT Frequency  3x / week    PT Duration  4 weeks    PT Treatment/Interventions  ADLs/Self Care Home Management;Cryotherapy;Electrical Stimulation;Ultrasound;Moist Heat;Iontophoresis 4mg /ml Dexamethasone;Therapeutic activities;Therapeutic exercise;Manual techniques;Patient/family education;Passive range of motion;Dry needling;Vasopneumatic Device    PT Next Visit Plan  IASTM to right Achilles, Combo e'stim/U/S, rockerboard.  Ionto.  Vaso and e'stim.    Consulted and Agree with Plan of Care  Patient       Patient will benefit from skilled therapeutic intervention in order to improve the following deficits and impairments:  Pain,  Decreased range of motion  Visit Diagnosis: Pain in right ankle and joints of right foot  Stiffness of right ankle, not elsewhere classified     Problem List Patient Active Problem List   Diagnosis Date Noted  . Controlled substance agreement signed 11/22/2018  . Blood glucose elevated 11/22/2018  . Vitamin D deficiency 11/22/2018  . Sensorineural hearing loss (SNHL), bilateral 06/17/2018  . 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  . Dyslipidemia 08/08/2017  . Abnormal EKG 08/08/2017  . Cervical somatic dysfunction 03/06/2016  . 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 Generalized anxiety disorder 03/06/2016  . Left knee pain 11/05/2014  . Allergic rhinitis 06/18/2013  . Severe obesity (BMI >= 40) ([place]) 06/18/2013  . BPH (benign prostatic hypertrophy) 07/03/2012  . Essential hypertension, benign 07/03/2012  . Depression, recurrent ([place]) 07/03/2012  . GERD (gastroesophageal reflux disease) 07/03/2012  . Testosterone deficiency 07/03/2012    Arthur Foster, [name] 04/03/2019, 9:50 AM  [facility] [address] [place], [place], [address] Phone: [phone]   Fax:  [phone]  Name: Arthur Foster MRN: [id number] Date of Birth: [birth year]

## 2019-04-07 ENCOUNTER — Other Ambulatory Visit: Payer: Self-pay

## 2019-04-07 ENCOUNTER — Ambulatory Visit: Payer: Medicare Other | Attending: Orthopedic Surgery | Admitting: Physical Therapy

## 2019-04-07 DIAGNOSIS — M25571 Pain in right ankle and joints of right foot: Secondary | ICD-10-CM | POA: Diagnosis not present

## 2019-04-07 DIAGNOSIS — M25671 Stiffness of right ankle, not elsewhere classified: Secondary | ICD-10-CM | POA: Insufficient documentation

## 2019-04-07 NOTE — Therapy (Signed)
[facility] [place], [place], [address] Phone: [phone]   Fax:  [phone]  Physical Therapy Treatment  Patient Details  Name: Arthur Foster MRN: [id number] Date of Birth: 02/25/1951 Referring Provider (PT): Victorino December MD   Encounter Date: 04/07/2019  PT End of Session - 04/07/19 0935    Visit Number  3    Number of Visits  12    Date for PT Re-Evaluation  04/28/19    Authorization Type  FOTO.    PT Start Time  0815    PT Stop Time  0908    PT Time Calculation (min)  53 min    Activity Tolerance  Patient tolerated treatment well    Behavior During Therapy  [facility] for tasks assessed/performed       Past Medical History:  Diagnosis Date  . Anxiety   . BPH (benign prostatic hyperplasia)   . Depression   . Hyperlipidemia   . Hypertension   . Testosterone deficiency     Past Surgical History:  Procedure Laterality Date  . FOOT SURGERY    . HERNIA REPAIR Left inguinal    There were no vitals filed for this visit.  Subjective Assessment - 04/07/19 0853    Subjective  COVID-19 screen performed prior to patient entering clinic.  Up and down ladders puttng up Christmas lights.  Pain ta 7/10 with patient pointing to the base of his 5th MT.    Pertinent History  HTN, left knee pain, foot surgery.    Limitations  Standing    How long can you stand comfortably?  Over an hour.    Patient Stated Goals  Get out of pain.    Currently in Pain?  Yes    Pain Score  7     Pain Location  Foot    Pain Orientation  Right    Pain Descriptors / Indicators  Aching    Pain Type  Chronic pain    Pain Onset  More than a month ago                       [facility] Adult PT Treatment/Exercise - 04/07/19 0001      Electrical Stimulation   Electrical Stimulation Location  RT Achilles.    Electrical Stimulation Action  re-mod.    Electrical Stimulation Parameters  80-150 Hz x 20 minutes.    Electrical Stimulation Goals   Pain      Ultrasound   Ultrasound Location  RT distal Achilles.    Ultrasound Parameters  U/S at 1.50 W/CM2 x 8 minutes.      Iontophoresis   Type of Iontophoresis  Dexamethasone    Location  RT distal Achilles    Dose  80 mA-min.    Time  --   8.     Vasopneumatic   Number Minutes Vasopneumatic   20 minutes    Vasopnuematic Location   --   RT ankle.   Vasopneumatic Pressure  Medium      Manual Therapy   Manual Therapy  Soft tissue mobilization    Soft tissue mobilization  STW/M x 5 minutes to right distal Achilles and the base of the fifth MT.                  PT Long Term Goals - 04/01/19 1100      PT LONG TERM GOAL #1   Title  ind with a HEP.    Time  4    Period  Weeks    Status  New      PT LONG TERM GOAL #2   Title  Perform ADL's with pain not > 3/10.    Time  4    Period  Weeks    Status  New            Plan - 04/07/19 [id number]    Clinical Impression Statement  Patient had a significant flare-up of pain over the weekend due to going up and down ladders.  His CC was increased pain over the base of his fifth MT.    Personal Factors and Comorbidities  Comorbidity 1    Comorbidities  HTN, left knee pain, foot surgery.    Examination-Activity Limitations  Locomotion Level;Other    Examination-Participation Restrictions  Other    Stability/Clinical Decision Making  Evolving/Moderate complexity    Rehab Potential  Good    PT Frequency  3x / week    PT Duration  4 weeks    PT Treatment/Interventions  ADLs/Self Care Home Management;Cryotherapy;Electrical Stimulation;Ultrasound;Moist Heat;Iontophoresis 4mg /ml Dexamethasone;Therapeutic activities;Therapeutic exercise;Manual techniques;Patient/family education;Passive range of motion;Dry needling;Vasopneumatic Device    PT Next Visit Plan  IASTM to right Achilles, Combo e'stim/U/S, rockerboard.  Ionto.  Vaso and e'stim.    Consulted and Agree with Plan of Care  Patient       Patient will benefit from  skilled therapeutic intervention in order to improve the following deficits and impairments:  Pain, Decreased range of motion  Visit Diagnosis: Pain in right ankle and joints of right foot  Stiffness of right ankle, not elsewhere classified     Problem List Patient Active Problem List   Diagnosis Date Noted  . Controlled substance agreement signed 11/22/2018  . Blood glucose elevated 11/22/2018  . Vitamin D deficiency 11/22/2018  . Sensorineural hearing loss (SNHL), bilateral 06/17/2018  . 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  . Dyslipidemia 08/08/2017  . Abnormal EKG 08/08/2017  . Cervical somatic dysfunction 03/06/2016  . 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 Generalized anxiety disorder 03/06/2016  . Left knee pain 11/05/2014  . Allergic rhinitis 06/18/2013  . Severe obesity (BMI >= 40) ([place]) 06/18/2013  . BPH (benign prostatic hypertrophy) 07/03/2012  . Essential hypertension, benign 07/03/2012  . Depression, recurrent ([place]) 07/03/2012  . GERD (gastroesophageal reflux disease) 07/03/2012  . Testosterone deficiency 07/03/2012    Arthur Foster, [name] 04/07/2019, 9:40 AM  [facility] [place], [place], [address] Phone: [phone]   Fax:  [phone]  Name: Arthur Foster MRN: [id number] Date of Birth: 05-25-1950

## 2019-04-09 ENCOUNTER — Other Ambulatory Visit: Payer: Self-pay

## 2019-04-09 ENCOUNTER — Ambulatory Visit: Payer: Medicare Other | Admitting: Physical Therapy

## 2019-04-09 DIAGNOSIS — M25571 Pain in right ankle and joints of right foot: Secondary | ICD-10-CM

## 2019-04-09 DIAGNOSIS — M25671 Stiffness of right ankle, not elsewhere classified: Secondary | ICD-10-CM

## 2019-04-09 NOTE — Therapy (Signed)
[facility] [place], [place], [address] Phone: [phone]   Fax:  [phone]  Physical Therapy Treatment  Patient Details  Name: Arthur Foster MRN: [id number] Date of Birth: [birth year] Referring Provider (PT): Victorino December MD   Encounter Date: 04/09/2019    Past Medical History:  Diagnosis Date  . Anxiety   . BPH (benign prostatic hyperplasia)   . Depression   . Hyperlipidemia   . Hypertension   . Testosterone deficiency     Past Surgical History:  Procedure Laterality Date  . FOOT SURGERY    . HERNIA REPAIR Left inguinal    There were no vitals filed for this visit.  Subjective Assessment - 04/09/19 1024    Subjective  COVID-19 screen performed prior to patient entering clinic.  After my last treatment it was the best my foot has felt in a long time.    Pertinent History  HTN, left knee pain, foot surgery.    Limitations  Standing    How long can you stand comfortably?  Over an hour.    Patient Stated Goals  Get out of pain.    Currently in Pain?  Yes    Pain Score  4     Pain Location  Foot    Pain Orientation  Right    Pain Descriptors / Indicators  Aching    Pain Onset  More than a month ago                       [facility] Adult PT Treatment/Exercise - 04/09/19 0001      Modalities   Modalities  Electrical Stimulation;Vasopneumatic      Electrical Stimulation   Electrical Stimulation Location  Right Achilles    Electrical Stimulation Action  Pre-mod.    Electrical Stimulation Parameters  80-150 Hz x 15 minutes.    Electrical Stimulation Goals  Pain      Ultrasound   Ultrasound Location  Right distal Achilles    Ultrasound Parameters  Combo e'stim/U/S at 1.50 W/CM2 x 12 minutes.      Iontophoresis   Type of Iontophoresis  Dexamethasone    Location  Right distal Achilles    Dose  80 mA-min.    Time  --   8.     Vasopneumatic   Number Minutes Vasopneumatic   15 minutes      Manual Therapy   Manual Therapy  Soft tissue mobilization    Soft tissue mobilization  STW/M x                   PT Long Term Goals - 04/01/19 1100      PT LONG TERM GOAL #1   Title  ind with a HEP.    Time  4    Period  Weeks    Status  New      PT LONG TERM GOAL #2   Title  Perform ADL's with pain not > 3/10.    Time  4    Period  Weeks    Status  New            Plan - 04/09/19 1105    Clinical Impression Statement  The patient is responding very well to tretaments.  After last treatment he felt very good.  He had less 5th MT pain as well.    Personal Factors and Comorbidities  Comorbidity 1    Comorbidities  HTN, left knee pain, foot surgery.  Examination-Activity Limitations  Locomotion Level;Other    Examination-Participation Restrictions  Other    Stability/Clinical Decision Making  Evolving/Moderate complexity    Rehab Potential  Good    PT Frequency  3x / week    PT Duration  4 weeks    PT Treatment/Interventions  ADLs/Self Care Home Management;Cryotherapy;Electrical Stimulation;Ultrasound;Moist Heat;Iontophoresis 4mg /ml Dexamethasone;Therapeutic activities;Therapeutic exercise;Manual techniques;Patient/family education;Passive range of motion;Dry needling;Vasopneumatic Device       Patient will benefit from skilled therapeutic intervention in order to improve the following deficits and impairments:  Pain, Decreased range of motion  Visit Diagnosis: Pain in right ankle and joints of right foot  Stiffness of right ankle, not elsewhere classified     Problem List Patient Active Problem List   Diagnosis Date Noted  . Controlled substance agreement signed 11/22/2018  . Blood glucose elevated 11/22/2018  . Vitamin D deficiency 11/22/2018  . Sensorineural hearing loss (SNHL), bilateral 06/17/2018  . 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  . Dyslipidemia 08/08/2017  . Abnormal EKG 08/08/2017  . Cervical somatic dysfunction 03/06/2016  .  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 Generalized anxiety disorder 03/06/2016  . Left knee pain 11/05/2014  . Allergic rhinitis 06/18/2013  . Severe obesity (BMI >= 40) ([place]) 06/18/2013  . BPH (benign prostatic hypertrophy) 07/03/2012  . Essential hypertension, benign 07/03/2012  . Depression, recurrent ([place]) 07/03/2012  . GERD (gastroesophageal reflux disease) 07/03/2012  . Testosterone deficiency 07/03/2012    Luise Yamamoto, [name] 04/09/2019, 11:16 AM  [facility] [place], [place], [address] Phone: [phone]   Fax:  [phone]  Name: Skanda Doub MRN: [id number] Date of Birth: [birth year]

## 2019-04-14 ENCOUNTER — Ambulatory Visit: Payer: Medicare Other | Admitting: Physical Therapy

## 2019-04-14 ENCOUNTER — Other Ambulatory Visit: Payer: Self-pay

## 2019-04-14 DIAGNOSIS — M25671 Stiffness of right ankle, not elsewhere classified: Secondary | ICD-10-CM

## 2019-04-14 DIAGNOSIS — M25571 Pain in right ankle and joints of right foot: Secondary | ICD-10-CM

## 2019-04-14 NOTE — Therapy (Signed)
[facility] [place], [place], [address] Phone: [phone]   Fax:  [phone]  Physical Therapy Treatment  Patient Details  Name: Arthur Foster MRN: [id number] Date of Birth: 21-Apr-1950 Referring Provider (PT): Victorino December MD   Encounter Date: 04/14/2019  PT End of Session - 04/14/19 0930    Visit Number  5    Number of Visits  12    Date for PT Re-Evaluation  04/28/19    Authorization Type  FOTO.    PT Start Time  0815    PT Stop Time  0909    PT Time Calculation (min)  54 min       Past Medical History:  Diagnosis Date  . Anxiety   . BPH (benign prostatic hyperplasia)   . Depression   . Hyperlipidemia   . Hypertension   . Testosterone deficiency     Past Surgical History:  Procedure Laterality Date  . FOOT SURGERY    . HERNIA REPAIR Left inguinal    There were no vitals filed for this visit.  Subjective Assessment - 04/14/19 0852    Subjective  COVID-19 screen performed prior to patient entering clinic.  I'm better.  Flared-up some Saturday after doing a lot of stairs.    Pertinent History  HTN, left knee pain, foot surgery.    Limitations  Standing    How long can you stand comfortably?  Over an hour.    Currently in Pain?  Yes    Pain Score  4     Pain Location  Foot    Pain Orientation  Right    Pain Descriptors / Indicators  Aching    Pain Type  Chronic pain    Pain Onset  More than a month ago                       [facility] Adult PT Treatment/Exercise - 04/14/19 0001      Modalities   Modalities  Electrical Stimulation;Ultrasound;Vasopneumatic      Electrical Stimulation   Electrical Stimulation Location  Right Achilles    Electrical Stimulation Action  Pre-mod.    Electrical Stimulation Parameters  80-150 Hz x 15 minutes.    Electrical Stimulation Goals  Pain      Ultrasound   Ultrasound Location  Right distal Achilles    Ultrasound Parameters  Combo e'stim/U/S at 1.50  W/CM2 x 8 minutes.    Ultrasound Goals  Pain      Iontophoresis   Type of Iontophoresis  Dexamethasone    Location  Right distal Achilles    Dose  80 mA-Min.    Time  --   8.     Vasopneumatic   Number Minutes Vasopneumatic   15 minutes    Vasopnuematic Location   --   Right ankle.   Vasopneumatic Pressure  Low      Manual Therapy   Manual Therapy  Soft tissue mobilization    Soft tissue mobilization  IASTM x 8 to distal Achilles and calf musulature.                  PT Long Term Goals - 04/01/19 1100      PT LONG TERM GOAL #1   Title  ind with a HEP.    Time  4    Period  Weeks    Status  New      PT LONG TERM GOAL #2   Title  Perform ADL's with pain not > 3/10.    Time  4    Period  Weeks    Status  New            Plan - 04/14/19 0931    Clinical Impression Statement  Patient improving with less reported pain overall.  Pain continues to increase with stairs and ladders.    Personal Factors and Comorbidities  Comorbidity 1    Comorbidities  HTN, left knee pain, foot surgery.    Examination-Activity Limitations  Locomotion Level;Other    Examination-Participation Restrictions  Other    Stability/Clinical Decision Making  Evolving/Moderate complexity    Rehab Potential  Good    PT Frequency  3x / week    PT Duration  4 weeks    PT Treatment/Interventions  ADLs/Self Care Home Management;Cryotherapy;Electrical Stimulation;Ultrasound;Moist Heat;Iontophoresis 4mg /ml Dexamethasone;Therapeutic activities;Therapeutic exercise;Manual techniques;Patient/family education;Passive range of motion;Dry needling;Vasopneumatic Device    PT Next Visit Plan  IASTM to right Achilles, Combo e'stim/U/S, rockerboard.  Ionto.  Vaso and e'stim.    Consulted and Agree with Plan of Care  Patient       Patient will benefit from skilled therapeutic intervention in order to improve the following deficits and impairments:  Pain, Decreased range of motion  Visit  Diagnosis: Pain in right ankle and joints of right foot  Stiffness of right ankle, not elsewhere classified     Problem List Patient Active Problem List   Diagnosis Date Noted  . Controlled substance agreement signed 11/22/2018  . Blood glucose elevated 11/22/2018  . Vitamin D deficiency 11/22/2018  . Sensorineural hearing loss (SNHL), bilateral 06/17/2018  . 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  . Dyslipidemia 08/08/2017  . Abnormal EKG 08/08/2017  . Cervical somatic dysfunction 03/06/2016  . 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 Generalized anxiety disorder 03/06/2016  . Left knee pain 11/05/2014  . Allergic rhinitis 06/18/2013  . Severe obesity (BMI >= 40) ([place]) 06/18/2013  . BPH (benign prostatic hypertrophy) 07/03/2012  . Essential hypertension, benign 07/03/2012  . Depression, recurrent ([place]) 07/03/2012  . GERD (gastroesophageal reflux disease) 07/03/2012  . Testosterone deficiency 07/03/2012    Glenetta Kiger, [name] 04/14/2019, 9:36 AM  [facility] [address] [place], [place], [address] Phone: [phone]   Fax:  [phone]  Name: Diem Stinton MRN: [id number] Date of Birth: 1951/02/07

## 2019-04-16 ENCOUNTER — Ambulatory Visit: Payer: Medicare Other | Admitting: Physical Therapy

## 2019-04-16 ENCOUNTER — Other Ambulatory Visit: Payer: Self-pay

## 2019-04-16 DIAGNOSIS — M25671 Stiffness of right ankle, not elsewhere classified: Secondary | ICD-10-CM | POA: Diagnosis not present

## 2019-04-16 DIAGNOSIS — M25571 Pain in right ankle and joints of right foot: Secondary | ICD-10-CM | POA: Diagnosis not present

## 2019-04-16 NOTE — Therapy (Signed)
[facility] [place], [place], [address] Phone: [phone]   Fax:  [phone]  Physical Therapy Treatment  Patient Details  Name: Arthur Foster MRN: [id number] Date of Birth: May 09, 1950 Referring Provider (PT): Victorino December MD   Encounter Date: 04/16/2019  PT End of Session - 04/16/19 1248    Visit Number  6    Number of Visits  12    Date for PT Re-Evaluation  04/28/19    Authorization Type  FOTO.    PT Start Time  0815    PT Stop Time  0900    PT Time Calculation (min)  45 min    Activity Tolerance  Patient tolerated treatment well    Behavior During Therapy  [facility] for tasks assessed/performed       Past Medical History:  Diagnosis Date  . Anxiety   . BPH (benign prostatic hyperplasia)   . Depression   . Hyperlipidemia   . Hypertension   . Testosterone deficiency     Past Surgical History:  Procedure Laterality Date  . FOOT SURGERY    . HERNIA REPAIR Left inguinal    There were no vitals filed for this visit.  Subjective Assessment - 04/16/19 1246    Subjective  COVID-19 screen performed prior to patient entering clinic.  Doing more at home with less pain.    Pertinent History  HTN, left knee pain, foot surgery.    Limitations  Standing    How long can you stand comfortably?  Over an hour.    Patient Stated Goals  Get out of pain.    Currently in Pain?  Yes    Pain Score  3     Pain Location  Foot    Pain Orientation  Right    Pain Descriptors / Indicators  Aching    Pain Type  Chronic pain    Pain Onset  More than a month ago                       [facility] Adult PT Treatment/Exercise - 04/16/19 0001      Modalities   Modalities  Electrical Stimulation;Moist Heat      Moist Heat Therapy   Number Minutes Moist Heat  15 Minutes      Electrical Stimulation   Electrical Stimulation Location  Right Achilles.    Electrical Stimulation Action  Pre-mod.    Electrical Stimulation  Parameters  80-150 Hz x 15 minutes.    Electrical Stimulation Goals  Pain      Iontophoresis   Type of Iontophoresis  Dexamethasone    Location  Right Achilles.    Dose  80 mA-Min.    Time  --   8.     Manual Therapy   Manual Therapy  Soft tissue mobilization    Soft tissue mobilization  STW/M including IASTM x 15 minutes to patient's right calf and distal Achilles at calcaneal attachement.                  PT Long Term Goals - 04/01/19 1100      PT LONG TERM GOAL #1   Title  ind with a HEP.    Time  4    Period  Weeks    Status  New      PT LONG TERM GOAL #2   Title  Perform ADL's with pain not > 3/10.    Time  4    Period  Weeks    Status  New            Plan - 04/16/19 1250    Clinical Impression Statement  Patient doing very well with treatments and able to perform ADL's at home with less pain.    Personal Factors and Comorbidities  Comorbidity 1    Comorbidities  HTN, left knee pain, foot surgery.    Examination-Activity Limitations  Locomotion Level;Other    Examination-Participation Restrictions  Other    Stability/Clinical Decision Making  Evolving/Moderate complexity    Rehab Potential  Good    PT Frequency  3x / week    PT Duration  4 weeks    PT Treatment/Interventions  ADLs/Self Care Home Management;Cryotherapy;Electrical Stimulation;Ultrasound;Moist Heat;Iontophoresis 4mg /ml Dexamethasone;Therapeutic activities;Therapeutic exercise;Manual techniques;Patient/family education;Passive range of motion;Dry needling;Vasopneumatic Device    PT Next Visit Plan  IASTM to right Achilles, Combo e'stim/U/S, rockerboard.  Ionto.  Vaso and e'stim.    Consulted and Agree with Plan of Care  Patient       Patient will benefit from skilled therapeutic intervention in order to improve the following deficits and impairments:  Pain, Decreased range of motion  Visit Diagnosis: Pain in right ankle and joints of right foot  Stiffness of right ankle, not  elsewhere classified     Problem List Patient Active Problem List   Diagnosis Date Noted  . Controlled substance agreement signed 11/22/2018  . Blood glucose elevated 11/22/2018  . Vitamin D deficiency 11/22/2018  . Sensorineural hearing loss (SNHL), bilateral 06/17/2018  . 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  . Dyslipidemia 08/08/2017  . Abnormal EKG 08/08/2017  . Cervical somatic dysfunction 03/06/2016  . 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 Generalized anxiety disorder 03/06/2016  . Left knee pain 11/05/2014  . Allergic rhinitis 06/18/2013  . Severe obesity (BMI >= 40) ([place]) 06/18/2013  . BPH (benign prostatic hypertrophy) 07/03/2012  . Essential hypertension, benign 07/03/2012  . Depression, recurrent ([place]) 07/03/2012  . GERD (gastroesophageal reflux disease) 07/03/2012  . Testosterone deficiency 07/03/2012    Arthur Foster, Arthur Foster 04/16/2019, 12:54 PM  [facility] [address] [place], [place], [address] Phone: [phone]   Fax:  [phone]  Name: Arthur Foster MRN: [id number] Date of Birth: 1951-01-11

## 2019-04-21 ENCOUNTER — Other Ambulatory Visit: Payer: Self-pay

## 2019-04-21 ENCOUNTER — Ambulatory Visit: Payer: Medicare Other | Admitting: Physical Therapy

## 2019-04-21 DIAGNOSIS — M25671 Stiffness of right ankle, not elsewhere classified: Secondary | ICD-10-CM | POA: Diagnosis not present

## 2019-04-21 DIAGNOSIS — M25571 Pain in right ankle and joints of right foot: Secondary | ICD-10-CM

## 2019-04-21 NOTE — Therapy (Signed)
[facility] [place], [place], [address] Phone: [phone]   Fax:  [phone]  Physical Therapy Treatment  Patient Details  Name: Arthur Foster MRN: [id number] Date of Birth: 03-11-1951 Referring Provider (PT): Victorino December MD   Encounter Date: 04/21/2019  PT End of Session - 04/21/19 0948    Visit Number  7    Number of Visits  12    Date for PT Re-Evaluation  04/28/19    Authorization Type  FOTO.    PT Start Time  0815    PT Stop Time  0903    PT Time Calculation (min)  48 min    Activity Tolerance  Patient tolerated treatment well    Behavior During Therapy  [facility] for tasks assessed/performed       Past Medical History:  Diagnosis Date  . Anxiety   . BPH (benign prostatic hyperplasia)   . Depression   . Hyperlipidemia   . Hypertension   . Testosterone deficiency     Past Surgical History:  Procedure Laterality Date  . FOOT SURGERY    . HERNIA REPAIR Left inguinal    There were no vitals filed for this visit.  Subjective Assessment - 04/21/19 0946    Subjective  COVID-19 screen performed prior to patient entering clinic.  Doing great if I don't over do it.    Pertinent History  HTN, left knee pain, foot surgery.    Limitations  Standing    How long can you stand comfortably?  Over an hour.    Patient Stated Goals  Get out of pain.    Currently in Pain?  Yes    Pain Score  3     Pain Location  Foot    Pain Orientation  Right    Pain Descriptors / Indicators  Aching    Pain Onset  More than a month ago                       [facility] Adult PT Treatment/Exercise - 04/21/19 0001      Moist Heat Therapy   Number Minutes Moist Heat  15 Minutes    Moist Heat Location  --   Right ankle.     Acupuncturist Location  Right Achilles    Electrical Stimulation Action  Pre-mod.    Electrical Stimulation Parameters  80-150 Hz x 15 minutes.    Electrical  Stimulation Goals  Pain      Iontophoresis   Type of Iontophoresis  Dexamethasone    Location  Right Achilles    Dose  80 mA-Min.    Time  --   8.     Manual Therapy   Manual Therapy  Soft tissue mobilization    Soft tissue mobilization  STW/M x 15 minutes to patient's distal right calf and Achilles.                  PT Long Term Goals - 04/01/19 1100      PT LONG TERM GOAL #1   Title  ind with a HEP.    Time  4    Period  Weeks    Status  New      PT LONG TERM GOAL #2   Title  Perform ADL's with pain not > 3/10.    Time  4    Period  Weeks    Status  New  Plan - 04/21/19 0952    Clinical Impression Statement  Patient had no pain complaints after treatment.    Personal Factors and Comorbidities  Comorbidity 1    Comorbidities  HTN, left knee pain, foot surgery.    Examination-Activity Limitations  Locomotion Level;Other    Stability/Clinical Decision Making  Evolving/Moderate complexity    Rehab Potential  Good    PT Frequency  3x / week    PT Duration  4 weeks    PT Treatment/Interventions  ADLs/Self Care Home Management;Cryotherapy;Electrical Stimulation;Ultrasound;Moist Heat;Iontophoresis 4mg /ml Dexamethasone;Therapeutic activities;Therapeutic exercise;Manual techniques;Patient/family education;Passive range of motion;Dry needling;Vasopneumatic Device    PT Next Visit Plan  IASTM to right Achilles, Combo e'stim/U/S, rockerboard.  Ionto.  Vaso and e'stim.    Consulted and Agree with Plan of Care  Patient       Patient will benefit from skilled therapeutic intervention in order to improve the following deficits and impairments:     Visit Diagnosis: Pain in right ankle and joints of right foot  Stiffness of right ankle, not elsewhere classified     Problem List Patient Active Problem List   Diagnosis Date Noted  . Controlled substance agreement signed 11/22/2018  . Blood glucose elevated 11/22/2018  . Vitamin D deficiency  11/22/2018  . Sensorineural hearing loss (SNHL), bilateral 06/17/2018  . 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  . Dyslipidemia 08/08/2017  . Abnormal EKG 08/08/2017  . Cervical somatic dysfunction 03/06/2016  . 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 Generalized anxiety disorder 03/06/2016  . Left knee pain 11/05/2014  . Allergic rhinitis 06/18/2013  . Severe obesity (BMI >= 40) ([place]) 06/18/2013  . BPH (benign prostatic hypertrophy) 07/03/2012  . Essential hypertension, benign 07/03/2012  . Depression, recurrent ([place]) 07/03/2012  . GERD (gastroesophageal reflux disease) 07/03/2012  . Testosterone deficiency 07/03/2012    Arthur Foster, [name] 04/21/2019, 9:54 AM  [facility] [address] [place], [place], [address] Phone: [phone]   Fax:  [phone]  Name: Arthur Foster MRN: [id number] Date of Birth: December 27, 1950

## 2019-04-23 ENCOUNTER — Ambulatory Visit: Payer: Medicare Other | Admitting: Physical Therapy

## 2019-04-23 ENCOUNTER — Other Ambulatory Visit: Payer: Self-pay

## 2019-04-23 DIAGNOSIS — M25571 Pain in right ankle and joints of right foot: Secondary | ICD-10-CM | POA: Diagnosis not present

## 2019-04-23 DIAGNOSIS — M25671 Stiffness of right ankle, not elsewhere classified: Secondary | ICD-10-CM | POA: Diagnosis not present

## 2019-04-23 NOTE — Therapy (Signed)
[facility] [place], [place], [address] Phone: [phone]   Fax:  [phone]  Physical Therapy Treatment  Patient Details  Name: Arthur Foster MRN: [id number] Date of Birth: [birth year] Referring Provider (PT): Victorino December MD   Encounter Date: 04/23/2019  PT End of Session - 04/23/19 0936    Visit Number  8    Number of Visits  12    Date for PT Re-Evaluation  04/28/19    Authorization Type  FOTO.    PT Start Time  0818    PT Stop Time  0905    PT Time Calculation (min)  47 min    Activity Tolerance  Patient tolerated treatment well    Behavior During Therapy  [facility] for tasks assessed/performed       Past Medical History:  Diagnosis Date  . Anxiety   . BPH (benign prostatic hyperplasia)   . Depression   . Hyperlipidemia   . Hypertension   . Testosterone deficiency     Past Surgical History:  Procedure Laterality Date  . FOOT SURGERY    . HERNIA REPAIR Left inguinal    There were no vitals filed for this visit.  Subjective Assessment - 04/23/19 0937    Subjective  COVID-19 screen performed prior to patient entering clinic.  I'm much better since starting PT.    Pertinent History  HTN, left knee pain, foot surgery.    Limitations  Standing    How long can you stand comfortably?  Over an hour.    Patient Stated Goals  Get out of pain.    Currently in Pain?  Yes    Pain Score  3     Pain Location  Foot    Pain Orientation  Right    Pain Descriptors / Indicators  Aching    Pain Onset  More than a month ago                       [facility] Adult PT Treatment/Exercise - 04/23/19 0001      Modalities   Modalities  Electrical Stimulation;Moist Heat      Moist Heat Therapy   Number Minutes Moist Heat  15 Minutes    Moist Heat Location  --   Right heel.     Acupuncturist Location  Right Achilles    Electrical Stimulation Action  Pre-mod.    Electrical  Stimulation Parameters  80-150 Hz x 15 minutes.    Electrical Stimulation Goals  Pain      Iontophoresis   Type of Iontophoresis  Dexamethasone    Location  RT Achilles.    Dose  80 mA-Min.    Time  --   8.     Manual Therapy   Manual Therapy  Soft tissue mobilization    Soft tissue mobilization  STW/M including IASTM x 17 minutes to patient's right calf and Achilles tendon.                  PT Long Term Goals - 04/01/19 1100      PT LONG TERM GOAL #1   Title  ind with a HEP.    Time  4    Period  Weeks    Status  New      PT LONG TERM GOAL #2   Title  Perform ADL's with pain not > 3/10.    Time  4    Period  Weeks    Status  New            Plan - 04/23/19 0900    Clinical Impression Statement  Excellent progress with FOTO limitation score of 25% limitation.    Personal Factors and Comorbidities  Comorbidity 1    Comorbidities  HTN, left knee pain, foot surgery.    Examination-Activity Limitations  Locomotion Level;Other    Examination-Participation Restrictions  Other    Stability/Clinical Decision Making  Evolving/Moderate complexity    Rehab Potential  Good    PT Frequency  3x / week    PT Duration  4 weeks    PT Treatment/Interventions  ADLs/Self Care Home Management;Cryotherapy;Electrical Stimulation;Ultrasound;Moist Heat;Iontophoresis 4mg /ml Dexamethasone;Therapeutic activities;Therapeutic exercise;Manual techniques;Patient/family education;Passive range of motion;Dry needling;Vasopneumatic Device    PT Next Visit Plan  IASTM to right Achilles, Combo e'stim/U/S, rockerboard.  Ionto.  Vaso and e'stim.    Consulted and Agree with Plan of Care  Patient       Patient will benefit from skilled therapeutic intervention in order to improve the following deficits and impairments:  Pain, Decreased range of motion  Visit Diagnosis: Pain in right ankle and joints of right foot  Stiffness of right ankle, not elsewhere classified     Problem  List Patient Active Problem List   Diagnosis Date Noted  . Controlled substance agreement signed 11/22/2018  . Blood glucose elevated 11/22/2018  . Vitamin D deficiency 11/22/2018  . Sensorineural hearing loss (SNHL), bilateral 06/17/2018  . 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  . Dyslipidemia 08/08/2017  . Abnormal EKG 08/08/2017  . Cervical somatic dysfunction 03/06/2016  . 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 Generalized anxiety disorder 03/06/2016  . Left knee pain 11/05/2014  . Allergic rhinitis 06/18/2013  . Severe obesity (BMI >= 40) ([place]) 06/18/2013  . BPH (benign prostatic hypertrophy) 07/03/2012  . Essential hypertension, benign 07/03/2012  . Depression, recurrent ([place]) 07/03/2012  . GERD (gastroesophageal reflux disease) 07/03/2012  . Testosterone deficiency 07/03/2012    Arthur Foster, Arthur Foster 04/23/2019, 10:03 AM  [facility] [address] [place], [place], [address] Phone: [phone]   Fax:  [phone]  Name: Arthur Foster MRN: [id number] Date of Birth: 10/12/1950

## 2019-04-28 ENCOUNTER — Encounter: Payer: No Typology Code available for payment source | Admitting: Physical Therapy

## 2019-04-30 ENCOUNTER — Ambulatory Visit: Payer: Medicare Other | Admitting: Physical Therapy

## 2019-04-30 ENCOUNTER — Other Ambulatory Visit: Payer: Self-pay

## 2019-04-30 DIAGNOSIS — M25571 Pain in right ankle and joints of right foot: Secondary | ICD-10-CM | POA: Diagnosis not present

## 2019-04-30 DIAGNOSIS — M25671 Stiffness of right ankle, not elsewhere classified: Secondary | ICD-10-CM

## 2019-04-30 NOTE — Therapy (Signed)
[facility] [place], [place], [address] Phone: [phone]   Fax:  [phone]  Physical Therapy Treatment  Patient Details  Name: Arthur Foster MRN: [id number] Date of Birth: [birth year] Referring Provider (PT): Victorino December MD   Encounter Date: 04/30/2019  PT End of Session - 04/30/19 0956    Visit Number  9    Number of Visits  12    Date for PT Re-Evaluation  04/28/19    Authorization Type  FOTO.    PT Start Time  0815    PT Stop Time  0909    PT Time Calculation (min)  54 min       Past Medical History:  Diagnosis Date  . Anxiety   . BPH (benign prostatic hyperplasia)   . Depression   . Hyperlipidemia   . Hypertension   . Testosterone deficiency     Past Surgical History:  Procedure Laterality Date  . FOOT SURGERY    . HERNIA REPAIR Left inguinal    There were no vitals filed for this visit.  Subjective Assessment - 04/30/19 0952    Subjective  COVID-19 screen performed prior to patient entering clinic.  Overall I'm much better.    Pertinent History  HTN, left knee pain, foot surgery.    Limitations  Standing    How long can you stand comfortably?  Over an hour.    Patient Stated Goals  Get out of pain.    Currently in Pain?  Yes    Pain Score  2     Pain Location  Foot    Pain Orientation  Right    Pain Descriptors / Indicators  Aching    Pain Type  Chronic pain    Pain Onset  More than a month ago                       [facility] Adult PT Treatment/Exercise - 04/30/19 0001      Modalities   Modalities  Electrical Stimulation;Ultrasound      Moist Heat Therapy   Number Minutes Moist Heat  20 Minutes    Moist Heat Location  --   Right Achilles.     Acupuncturist Location  RT Achilles.    Electrical Stimulation Action  Pre-mod.    Electrical Stimulation Parameters  80-150 Hz x 20 minutes.    Electrical Stimulation Goals  Pain      Ultrasound    Ultrasound Location  Right Achilles    Ultrasound Parameters  Combo e'stim/U/S at 1.50 W/CM2 x 12 minutes.    Ultrasound Goals  Pain      Manual Therapy   Manual Therapy  Soft tissue mobilization    Soft tissue mobilization  STW/M x 12 minutes to patient's right distal calf, Achilles and heel.                  PT Long Term Goals - 04/01/19 1100      PT LONG TERM GOAL #1   Title  ind with a HEP.    Time  4    Period  Weeks    Status  New      PT LONG TERM GOAL #2   Title  Perform ADL's with pain not > 3/10.    Time  4    Period  Weeks    Status  New  Plan - 04/30/19 0957    Clinical Impression Statement  Overall the patient reports very good progress.  He states if he works a long day and is up and down ladders his pain increases.  He feels this is related to left knee pain and overcompensating over his right LE.    Personal Factors and Comorbidities  Comorbidity 1    Comorbidities  HTN, left knee pain, foot surgery.    Examination-Activity Limitations  Locomotion Level;Other    Examination-Participation Restrictions  Other    Stability/Clinical Decision Making  Evolving/Moderate complexity    Rehab Potential  Good    PT Frequency  3x / week    PT Duration  4 weeks    PT Treatment/Interventions  ADLs/Self Care Home Management;Cryotherapy;Electrical Stimulation;Ultrasound;Moist Heat;Iontophoresis 4mg /ml Dexamethasone;Therapeutic activities;Therapeutic exercise;Manual techniques;Patient/family education;Passive range of motion;Dry needling;Vasopneumatic Device    PT Next Visit Plan  IASTM to right Achilles, Combo e'stim/U/S, rockerboard.  Ionto.  Vaso and e'stim.    Consulted and Agree with Plan of Care  Patient       Patient will benefit from skilled therapeutic intervention in order to improve the following deficits and impairments:  Pain, Decreased range of motion  Visit Diagnosis: Pain in right ankle and joints of right foot  Stiffness of  right ankle, not elsewhere classified     Problem List Patient Active Problem List   Diagnosis Date Noted  . Controlled substance agreement signed 11/22/2018  . Blood glucose elevated 11/22/2018  . Vitamin D deficiency 11/22/2018  . Sensorineural hearing loss (SNHL), bilateral 06/17/2018  . 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  . Dyslipidemia 08/08/2017  . Abnormal EKG 08/08/2017  . Cervical somatic dysfunction 03/06/2016  . 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 Generalized anxiety disorder 03/06/2016  . Left knee pain 11/05/2014  . Allergic rhinitis 06/18/2013  . Severe obesity (BMI >= 40) ([place]) 06/18/2013  . BPH (benign prostatic hypertrophy) 07/03/2012  . Essential hypertension, benign 07/03/2012  . Depression, recurrent ([place]) 07/03/2012  . GERD (gastroesophageal reflux disease) 07/03/2012  . Testosterone deficiency 07/03/2012    Arthur Foster, [name] 04/30/2019, 10:00 AM  [facility] [place], [place], [address] Phone: [phone]   Fax:  [phone]  Name: Arthur Foster MRN: [id number] Date of Birth: 1950-07-02

## 2019-05-05 ENCOUNTER — Ambulatory Visit: Payer: Medicare Other | Attending: Orthopedic Surgery | Admitting: Physical Therapy

## 2019-05-05 ENCOUNTER — Other Ambulatory Visit: Payer: Self-pay

## 2019-05-05 DIAGNOSIS — M25571 Pain in right ankle and joints of right foot: Secondary | ICD-10-CM | POA: Insufficient documentation

## 2019-05-05 DIAGNOSIS — M25671 Stiffness of right ankle, not elsewhere classified: Secondary | ICD-10-CM | POA: Insufficient documentation

## 2019-05-05 NOTE — Therapy (Addendum)
[facility] [place], [place], [address] Phone: [phone]   Fax:  [phone]  Physical Therapy Treatment  Patient Details  Name: Arthur Foster MRN: [id number] Date of Birth: 1950-05-04 Referring Provider (PT): Victorino December MD   Encounter Date: 05/05/2019  PT End of Session - 05/05/19 1003    Visit Number  10    Number of Visits  12    Date for PT Re-Evaluation  04/28/19    Authorization Type  FOTO.    PT Start Time  0817    PT Stop Time  0903    PT Time Calculation (min)  46 min    Activity Tolerance  Patient tolerated treatment well    Behavior During Therapy  [facility] for tasks assessed/performed       Past Medical History:  Diagnosis Date  . Anxiety   . BPH (benign prostatic hyperplasia)   . Depression   . Hyperlipidemia   . Hypertension   . Testosterone deficiency     Past Surgical History:  Procedure Laterality Date  . FOOT SURGERY    . HERNIA REPAIR Left inguinal    There were no vitals filed for this visit.  Subjective Assessment - 05/05/19 0959    Subjective  COVID-19 screen performed prior to patient entering clinic.  Treatments have helped a lot and now I'm taking Naproxen which has helped too.    Pertinent History  HTN, left knee pain, foot surgery.    Limitations  Standing    How long can you stand comfortably?  Over an hour.    Patient Stated Goals  Get out of pain.    Currently in Pain?  Yes    Pain Score  1     Pain Location  Foot    Pain Orientation  Right    Pain Descriptors / Indicators  Aching    Pain Onset  More than a month ago          HMP and e'stim x 10 minutes to rt heel.  U/S x 12 minutes  STW/M x 11 minutes.                          PT Long Term Goals - 04/01/19 1100      PT LONG TERM GOAL #1   Title  ind with a HEP.    Time  4    Period  Weeks    Status  New      PT LONG TERM GOAL #2   Title  Perform ADL's with pain not > 3/10.    Time   4    Period  Weeks    Status  New            Plan - 05/05/19 1003    Clinical Impression Statement  No pain reported after treatment and normal gait pattern.    Personal Factors and Comorbidities  Comorbidity 1    Comorbidities  HTN, left knee pain, foot surgery.    Examination-Activity Limitations  Locomotion Level;Other    Examination-Participation Restrictions  Other    Rehab Potential  Good    PT Frequency  3x / week    PT Duration  4 weeks    PT Treatment/Interventions  ADLs/Self Care Home Management;Cryotherapy;Electrical Stimulation;Ultrasound;Moist Heat;Iontophoresis 4mg /ml Dexamethasone;Therapeutic activities;Therapeutic exercise;Manual techniques;Patient/family education;Passive range of motion;Dry needling;Vasopneumatic Device    PT Next Visit Plan  IASTM to right Achilles, Combo e'stim/U/S, rockerboard.  Ionto.  Vaso and e'stim.    Consulted and Agree with Plan of Care  Patient       Patient will benefit from skilled therapeutic intervention in order to improve the following deficits and impairments:  Pain, Decreased range of motion  Visit Diagnosis: Pain in right ankle and joints of right foot  Stiffness of right ankle, not elsewhere classified     Problem List Patient Active Problem List   Diagnosis Date Noted  . Controlled substance agreement signed 11/22/2018  . Blood glucose elevated 11/22/2018  . Vitamin D deficiency 11/22/2018  . Sensorineural hearing loss (SNHL), bilateral 06/17/2018  . 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  . Dyslipidemia 08/08/2017  . Abnormal EKG 08/08/2017  . Cervical somatic dysfunction 03/06/2016  . 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 Generalized anxiety disorder 03/06/2016  . Left knee pain 11/05/2014  . Allergic rhinitis 06/18/2013  . Severe obesity (BMI >= 40) ([place]) 06/18/2013  . BPH (benign prostatic hypertrophy) 07/03/2012  . Essential hypertension, benign 07/03/2012  . Depression, recurrent ([place]) 07/03/2012  .  GERD (gastroesophageal reflux disease) 07/03/2012  . Testosterone deficiency 07/03/2012   Progress Note Reporting Period 04/01/19 to 05/05/19  See note below for Objective Data and Assessment of Progress/Goals. Excellent progress with patient expected to meet goals.  No pain reported after treatment today and he exhibited a normal gait cycle.     Edu On, [name] 05/05/2019, 10:49 AM  [facility] [address] [place], [place], [address] Phone: [phone]   Fax:  [phone]  Name: Arthur Foster MRN: [id number] Date of Birth: 11/17/1950

## 2019-05-07 ENCOUNTER — Ambulatory Visit: Payer: Medicare Other | Admitting: Physical Therapy

## 2019-05-07 ENCOUNTER — Other Ambulatory Visit: Payer: Self-pay

## 2019-05-07 DIAGNOSIS — M25571 Pain in right ankle and joints of right foot: Secondary | ICD-10-CM | POA: Diagnosis not present

## 2019-05-07 DIAGNOSIS — M25671 Stiffness of right ankle, not elsewhere classified: Secondary | ICD-10-CM | POA: Diagnosis not present

## 2019-05-07 NOTE — Therapy (Addendum)
[facility] [place], [place], [address] Phone: [phone]   Fax:  [phone]  Physical Therapy Treatment  Patient Details  Name: Arthur Foster MRN: [id number] Date of Birth: Feb 09, 1951 Referring Provider (PT): Victorino December MD   Encounter Date: 05/07/2019  PT End of Session - 05/07/19 0924    Visit Number  11    Number of Visits  12    Date for PT Re-Evaluation  04/28/19    Authorization Type  FOTO.    PT Start Time  0818    PT Stop Time  0913    PT Time Calculation (min)  55 min    Activity Tolerance  Patient tolerated treatment well    Behavior During Therapy  [facility] for tasks assessed/performed       Past Medical History:  Diagnosis Date  . Anxiety   . BPH (benign prostatic hyperplasia)   . Depression   . Hyperlipidemia   . Hypertension   . Testosterone deficiency     Past Surgical History:  Procedure Laterality Date  . FOOT SURGERY    . HERNIA REPAIR Left inguinal    There were no vitals filed for this visit.  Subjective Assessment - 05/07/19 0924    Subjective  COVID-19 screen performed prior to patient entering clinic.  Much better.    Pertinent History  HTN, left knee pain, foot surgery.    Limitations  Standing    How long can you stand comfortably?  Over an hour.    Patient Stated Goals  Get out of pain.                       [facility] Adult PT Treatment/Exercise - 05/07/19 0001      Modalities   Modalities  Electrical Stimulation;Moist Heat;Ultrasound      Moist Heat Therapy   Number Minutes Moist Heat  20 Minutes     Pre-mod to right heel x 20 minutes, U/S at 1.50 W/CM2 x 12 minutes and STW/M x 11 minutes.             PT Long Term Goals - 04/01/19 1100      PT LONG TERM GOAL #1   Title  ind with a HEP.    Time  4    Period  Weeks    Status  New      PT LONG TERM GOAL #2   Title  Perform ADL's with pain not > 3/10.    Time  4    Period  Weeks    Status  New             Plan - 05/07/19 [id number]    Clinical Impression Statement  No pain after treatment.    Personal Factors and Comorbidities  Comorbidity 1    Comorbidities  HTN, left knee pain, foot surgery.    Examination-Activity Limitations  Locomotion Level;Other    Examination-Participation Restrictions  Other    Rehab Potential  Good    PT Frequency  3x / week    PT Duration  4 weeks    PT Treatment/Interventions  ADLs/Self Care Home Management;Cryotherapy;Electrical Stimulation;Ultrasound;Moist Heat;Iontophoresis 4mg /ml Dexamethasone;Therapeutic activities;Therapeutic exercise;Manual techniques;Patient/family education;Passive range of motion;Dry needling;Vasopneumatic Device    PT Next Visit Plan  IASTM to right Achilles, Combo e'stim/U/S, rockerboard.  Ionto.  Vaso and e'stim.    Consulted and Agree with Plan of Care  Patient       Patient will benefit from skilled  therapeutic intervention in order to improve the following deficits and impairments:  Pain, Decreased range of motion  Visit Diagnosis: Pain in right ankle and joints of right foot  Stiffness of right ankle, not elsewhere classified     Problem List Patient Active Problem List   Diagnosis Date Noted  . Controlled substance agreement signed 11/22/2018  . Blood glucose elevated 11/22/2018  . Vitamin D deficiency 11/22/2018  . Sensorineural hearing loss (SNHL), bilateral 06/17/2018  . 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  . Dyslipidemia 08/08/2017  . Abnormal EKG 08/08/2017  . Cervical somatic dysfunction 03/06/2016  . 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 Generalized anxiety disorder 03/06/2016  . Left knee pain 11/05/2014  . Allergic rhinitis 06/18/2013  . Severe obesity (BMI >= 40) ([place]) 06/18/2013  . BPH (benign prostatic hypertrophy) 07/03/2012  . Essential hypertension, benign 07/03/2012  . Depression, recurrent ([place]) 07/03/2012  . GERD (gastroesophageal reflux disease) 07/03/2012  . Testosterone  deficiency 07/03/2012    Quintrell Baze, [name] 05/07/2019, 9:36 AM  [facility] [place], [place], [address] Phone: [phone]   Fax:  [phone]  Name: Shamarr Mcwhorter MRN: [id number] Date of Birth: June 25, 1950

## 2019-05-12 ENCOUNTER — Other Ambulatory Visit: Payer: Self-pay

## 2019-05-12 ENCOUNTER — Ambulatory Visit: Payer: Medicare Other | Admitting: Physical Therapy

## 2019-05-12 DIAGNOSIS — M25571 Pain in right ankle and joints of right foot: Secondary | ICD-10-CM

## 2019-05-12 DIAGNOSIS — M25671 Stiffness of right ankle, not elsewhere classified: Secondary | ICD-10-CM

## 2019-05-12 NOTE — Therapy (Addendum)
[facility] [place], [place], [address] Phone: [phone]   Fax:  [phone]  Physical Therapy Treatment  Patient Details  Name: Arthur Foster MRN: [id number] Date of Birth: [birth year] Referring Provider (PT): Victorino December MD   Encounter Date: 05/12/2019  PT End of Session - 05/12/19 1015    Visit Number  12    Number of Visits  12    Date for PT Re-Evaluation  04/28/19    Authorization Type  FOTO.    PT Start Time  0815    PT Stop Time  0914    PT Time Calculation (min)  59 min    Activity Tolerance  Patient tolerated treatment well    Behavior During Therapy  [facility] for tasks assessed/performed       Past Medical History:  Diagnosis Date  . Anxiety   . BPH (benign prostatic hyperplasia)   . Depression   . Hyperlipidemia   . Hypertension   . Testosterone deficiency     Past Surgical History:  Procedure Laterality Date  . FOOT SURGERY    . HERNIA REPAIR Left inguinal    There were no vitals filed for this visit.  Subjective Assessment - 05/12/19 0957    Subjective  COVID-19 screen performed prior to patient entering clinic.  Doing great.    Pertinent History  HTN, left knee pain, foot surgery.    Limitations  Standing    How long can you stand comfortably?  Over an hour.    Patient Stated Goals  Get out of pain.    Currently in Pain?  Yes    Pain Score  1     Pain Location  Foot    Pain Orientation  Right    Pain Descriptors / Indicators  Aching    Pain Type  Chronic pain    Pain Onset  More than a month ago                       [facility] Adult PT Treatment/Exercise - 05/12/19 0001      Modalities   Modalities  Electrical Stimulation;Moist Heat;Ultrasound      Moist Heat Therapy   Number Minutes Moist Heat  20 Minutes    Moist Heat Location  --   RT Heel.     Acupuncturist Location  RT Achilles.    Electrical Stimulation Action  Pre-mod.     Electrical Stimulation Parameters  80-150 Hz x 20 minutes.    Electrical Stimulation Goals  Pain      Ultrasound   Ultrasound Location  RT Achilles    Ultrasound Parameters  U/S at 1.50 W/CM2 x 12 minutes.    Ultrasound Goals  Pain      Manual Therapy   Manual Therapy  Soft tissue mobilization    Soft tissue mobilization  IASTM x 11 minutes to patient's right Achilles and heel.                  PT Long Term Goals - 04/01/19 1100      PT LONG TERM GOAL #1   Title  ind with a HEP.    Time  4    Period  Weeks    Status  New      PT LONG TERM GOAL #2   Title  Perform ADL's with pain not > 3/10.    Time  4    Period  Weeks  Status  New            Plan - 05/12/19 1018    Clinical Impression Statement  Excellent response to treatment.  No pain after treatment.  FOTO score has improved to a 16% limitation.  Patient has asked to be placed on hold for a bit to see how he does without PT.    Personal Factors and Comorbidities  Comorbidity 1    Comorbidities  HTN, left knee pain, foot surgery.    Examination-Activity Limitations  Locomotion Level;Other    Examination-Participation Restrictions  Other    Stability/Clinical Decision Making  Evolving/Moderate complexity    Rehab Potential  Good    PT Frequency  3x / week    PT Duration  4 weeks    PT Treatment/Interventions  ADLs/Self Care Home Management;Cryotherapy;Electrical Stimulation;Ultrasound;Moist Heat;Iontophoresis 9m/ml Dexamethasone;Therapeutic activities;Therapeutic exercise;Manual techniques;Patient/family education;Passive range of motion;Dry needling;Vasopneumatic Device    PT Next Visit Plan  IASTM to right Achilles, Combo e'stim/U/S, rockerboard.  Ionto.  Vaso and e'stim.    Consulted and Agree with Plan of Care  Patient       Patient will benefit from skilled therapeutic intervention in order to improve the following deficits and impairments:  Pain, Decreased range of motion  Visit  Diagnosis: Pain in right ankle and joints of right foot  Stiffness of right ankle, not elsewhere classified     Problem List Patient Active Problem List   Diagnosis Date Noted  . Controlled substance agreement signed 11/22/2018  . Blood glucose elevated 11/22/2018  . Vitamin D deficiency 11/22/2018  . Sensorineural hearing loss (SNHL), bilateral 06/17/2018  . 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  . Dyslipidemia 08/08/2017  . Abnormal EKG 08/08/2017  . Cervical somatic dysfunction 03/06/2016  . 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 Generalized anxiety disorder 03/06/2016  . Left knee pain 11/05/2014  . Allergic rhinitis 06/18/2013  . Severe obesity (BMI >= 40) ([place] 06/18/2013  . BPH (benign prostatic hypertrophy) 07/03/2012  . Essential hypertension, benign 07/03/2012  . Depression[place] recurrent ([place] 07/03/2012  . GERD (gastroesophageal reflux disease) 07/03/2012  . Testosterone deficiency 07/03/2012   PHYSICAL THERAPY DISCHARGE SUMMARY  Visits from Start of Care: 12.  Current functional level related to goals / functional outcomes: See above.   Remaining deficits: See below.   Education / Equipment: HEP. Plan: Patient agrees to discharge.  Patient goals were not met. Patient is being discharged due to being pleased with the current functional level.  ?????      Camari Wisham, CMaliMPT 05/12/2019, 10:23 AM  [facility] [address]Phone: [phone]  Fax:  [phone] Name: Arthur NelisMRN: [id number] of Birth: 109-Mar-1952

## 2019-05-21 ENCOUNTER — Other Ambulatory Visit: Payer: Self-pay | Admitting: Family Medicine

## 2019-05-21 DIAGNOSIS — F339 Major depressive disorder, recurrent, unspecified: Secondary | ICD-10-CM

## 2019-05-21 DIAGNOSIS — F411 Generalized anxiety disorder: Secondary | ICD-10-CM

## 2019-05-23 ENCOUNTER — Other Ambulatory Visit: Payer: Self-pay

## 2019-05-26 ENCOUNTER — Other Ambulatory Visit: Payer: Self-pay

## 2019-05-26 ENCOUNTER — Encounter: Payer: Self-pay | Admitting: Family Medicine

## 2019-05-26 ENCOUNTER — Ambulatory Visit (INDEPENDENT_AMBULATORY_CARE_PROVIDER_SITE_OTHER): Payer: Medicare Other | Admitting: Family Medicine

## 2019-05-26 VITALS — BP 128/77 | HR 74 | Temp 99.3°F | Resp 20 | Ht 73.0 in | Wt 320.0 lb

## 2019-05-26 DIAGNOSIS — Z23 Encounter for immunization: Secondary | ICD-10-CM | POA: Diagnosis not present

## 2019-05-26 DIAGNOSIS — F339 Major depressive disorder, recurrent, unspecified: Secondary | ICD-10-CM | POA: Diagnosis not present

## 2019-05-26 DIAGNOSIS — I10 Essential (primary) hypertension: Secondary | ICD-10-CM

## 2019-05-26 DIAGNOSIS — K219 Gastro-esophageal reflux disease without esophagitis: Secondary | ICD-10-CM | POA: Diagnosis not present

## 2019-05-26 DIAGNOSIS — E559 Vitamin D deficiency, unspecified: Secondary | ICD-10-CM | POA: Diagnosis not present

## 2019-05-26 DIAGNOSIS — F411 Generalized anxiety disorder: Secondary | ICD-10-CM

## 2019-05-26 DIAGNOSIS — E349 Endocrine disorder, unspecified: Secondary | ICD-10-CM

## 2019-05-26 DIAGNOSIS — E782 Mixed hyperlipidemia: Secondary | ICD-10-CM | POA: Diagnosis not present

## 2019-05-26 MED ORDER — ALPRAZOLAM 0.25 MG PO TABS: ORAL_TABLET | ORAL | 5 refills | Status: DC

## 2019-05-26 NOTE — Progress Notes (Signed)
Subjective:  Patient ID: Arthur Foster, male    DOB: 1950/04/29, 69 y.o.   MRN: [id number]  Patient Care Team: Baruch Gouty, FNP as PCP - General (Family Medicine)   Chief Complaint:  Medical Management of Chronic Issues (6 mo ), Hypertension, and Gastroesophageal Reflux   HPI: Arthur Foster is a 70 y.o. male presenting on 05/26/2019 for Medical Management of Chronic Issues (6 mo ), Hypertension, and Gastroesophageal Reflux   1. Testosterone deficiency Has not been taking repletion therapy as prescribed. States he forgets about it. States he does have fatigue and lack of libido. Pt willing to restart if levels are low today.   2. Essential hypertension, benign Compliant with medications without associated side effects. No headaches, chest pain, shortness of breath, leg swelling, palpitations, dizziness, or weakness. No confusion.   3. Gastroesophageal reflux disease without esophagitis Diet controlled. No new or worsening symptoms. Denies need for daily controlled medication.   4. Mixed dyslipidemia Not on statin therapy due to pt preference. Does try to watch diet. Does not exercise on a regular basis.   5. Morbid obesity ([place]) Does not exercise on a regular basis. Does try to watch diet.   6. Vitamin D deficiency Pt is taking oral repletion therapy. Denies bone pain and tenderness, muscle weakness, fracture, and difficulty walking. Lab Results  Component Value Date   VD25OH 38.6 11/22/2018   VD25OH 24.4 (L) 05/24/2018   VD25OH 38.2 11/21/2017   Lab Results  Component Value Date   CALCIUM 9.4 11/22/2018     7. Generalized anxiety disorder 8. Depression, recurrent ([place]) Reports he is doing well on current as needed xanax. He has denies SSRI and SNRI therapy in the past and does not wish to start. Does not wish to see counselor.  GAD 7 : Generalized Anxiety Score 11/22/2018  Nervous, Anxious, on Edge 0  Control/stop worrying 0  Worry too much - different things 1    Trouble relaxing 1  Restless 0  Easily annoyed or irritable 0  Afraid - awful might happen 0  Total GAD 7 Score 2    Depression screen [facility] 2/9 05/26/2019 05/26/2019 02/04/2019 11/22/2018 09/20/2018  Decreased Interest 0 0 0 0 0  Down, Depressed, Hopeless 0 0 0 0 0  PHQ - 2 Score 0 0 0 0 0        Relevant past medical, surgical, family, and social history reviewed and updated as indicated.  Allergies and medications reviewed and updated. Date reviewed: Chart in Epic.   Past Medical History:  Diagnosis Date  . Anxiety   . BPH (benign prostatic hyperplasia)   . Depression   . Hyperlipidemia   . Hypertension   . Testosterone deficiency     Past Surgical History:  Procedure Laterality Date  . FOOT SURGERY    . HERNIA REPAIR Left inguinal    Social History   Socioeconomic History  . Marital status: Married    Spouse name: Arthur Foster  . Number of children: 3  . Years of education: 59  . Highest education level: High school graduate  Occupational History  . Occupation: retired    Comment: works a few hours a week  Tobacco Use  . Smoking status: Former Smoker    Types: Cigarettes    Quit date: 1998    Years since quitting: 23.1  . Smokeless tobacco: Former Systems developer    Types: [place] date: 2002  Substance and Sexual Activity  . Alcohol  use: Yes    Alcohol/week: 14.0 standard drinks    Types: 14 Cans of beer per week  . Drug use: No  . Sexual activity: Yes    Birth control/protection: None  Other Topics Concern  . Not on file  Social History Narrative  . Not on file   Social Determinants of Health   Financial Resource Strain: Low Risk   . Difficulty of Paying Living Expenses: Not hard at all  Food Insecurity: No Food Insecurity  . Worried About Charity fundraiser in the Last Year: Never true  . Ran Out of Food in the Last Year: Never true  Transportation Needs: No Transportation Needs  . Lack of Transportation (Medical): No  . Lack of Transportation  (Non-Medical): No  Physical Activity: Inactive  . Days of Exercise per Week: 0 days  . Minutes of Exercise per Session: 0 min  Stress: No Stress Concern Present  . Feeling of Stress : Only a little  Social Connections: Somewhat Isolated  . Frequency of Communication with Friends and Family: More than three times a week  . Frequency of Social Gatherings with Friends and Family: More than three times a week  . Attends Religious Services: Never  . Active Member of Clubs or Organizations: No  . Attends Archivist Meetings: Never  . Marital Status: Married  Human resources officer Violence: Not At Risk  . Fear of Current or Ex-Partner: No  . Emotionally Abused: No  . Physically Abused: No  . Sexually Abused: No    Outpatient Encounter Medications as of 05/26/2019  Medication Sig  . ALPRAZolam (XANAX) 0.25 MG tablet TAKE 1 OR 2 TABLETS DAILY AS NEEDED  . Ascorbic Acid (VITAMIN C) 1000 MG tablet Take 1,000 mg by mouth daily.  . cetirizine (ZYRTEC) 10 MG tablet Take 1 tablet (10 mg total) by mouth daily.  [name] escitalopram (LEXAPRO) 20 MG tablet Take 1 tablet (20 mg total) by mouth daily.  [name] lisinopril (ZESTRIL) 20 MG tablet Take 1 tablet (20 mg total) by mouth daily.  . naproxen (NAPROSYN) 500 MG tablet Take 500 mg by mouth 2 (two) times daily as needed.  . Vitamin D, Ergocalciferol, (DRISDOL) 1.25 MG (50000 UT) CAPS capsule Take 1 capsule (50,000 Units total) by mouth once a week.  . [DISCONTINUED] ALPRAZolam (XANAX) 0.25 MG tablet TAKE 1 OR 2 TABLETS DAILY AS NEEDED  . testosterone cypionate (DEPOTESTOSTERONE CYPIONATE) 200 MG/ML injection Inject 1 mL (200 mg total) into the muscle every 28 (twenty-eight) days. (Patient not taking: Reported on 05/26/2019)   Facility-Administered Encounter Medications as of 05/26/2019  Medication  . testosterone cypionate (DEPOTESTOSTERONE CYPIONATE) injection 200 mg    Allergies  Allergen Reactions  . Ambien [Zolpidem Tartrate] Other (See Comments)     Review of Systems  Constitutional: Positive for fatigue. Negative for activity change, appetite change, chills, diaphoresis, fever and unexpected weight change.  HENT: Negative.   Eyes: Negative.  Negative for photophobia and visual disturbance.  Respiratory: Negative for cough, chest tightness and shortness of breath.   Cardiovascular: Negative for chest pain, palpitations and leg swelling.  Gastrointestinal: Negative for abdominal pain, blood in stool, constipation, diarrhea, nausea and vomiting.  Endocrine: Negative.  Negative for cold intolerance, heat intolerance, polydipsia, polyphagia and polyuria.  Genitourinary: Negative for decreased urine volume, difficulty urinating, dysuria, frequency and urgency.       Decreased Libido  Musculoskeletal: Negative for arthralgias and myalgias.  Skin: Negative.   Allergic/Immunologic: Negative.   Neurological: Negative for  dizziness, tremors, seizures, syncope, facial asymmetry, speech difficulty, weakness, light-headedness, numbness and headaches.  Hematological: Negative.   Psychiatric/Behavioral: Positive for sleep disturbance. Negative for agitation, behavioral problems, confusion, decreased concentration, dysphoric mood, hallucinations, self-injury and suicidal ideas. The patient is nervous/anxious. The patient is not hyperactive.   All other systems reviewed and are negative.       Objective:  BP 128/77   Pulse 74   Temp 99.3 F (37.4 C)   Resp 20   Ht '6\' 1"'  (1.854 m)   Wt (!) 320 lb (145.2 kg)   SpO2 95%   BMI 42.22 kg/m    Wt Readings from Last 3 Encounters:  05/26/19 (!) 320 lb (145.2 kg)  11/22/18 (!) 320 lb (145.2 kg)  09/20/18 (!) 318 lb (144.2 kg)    Physical Exam Vitals and nursing note reviewed.  Constitutional:      General: He is not in acute distress.    Appearance: Normal appearance. He is well-developed and well-groomed. He is obese. He is not ill-appearing, toxic-appearing or diaphoretic.  HENT:      Head: Normocephalic and atraumatic.     Jaw: There is normal jaw occlusion.     Right Ear: Hearing normal.     Left Ear: Hearing normal.     Nose: Nose normal.     Mouth/Throat:     Lips: Pink.     Mouth: Mucous membranes are moist.     Pharynx: Oropharynx is clear. Uvula midline.  Eyes:     General: Lids are normal.     Extraocular Movements: Extraocular movements intact.     Conjunctiva/sclera: Conjunctivae normal.     Pupils: Pupils are equal, round, and reactive to light.  Neck:     Thyroid: No thyroid mass, thyromegaly or thyroid tenderness.     Vascular: No carotid bruit or JVD.     Trachea: Trachea and phonation normal.  Cardiovascular:     Rate and Rhythm: Normal rate and regular rhythm.     Chest Wall: PMI is not displaced.     Pulses: Normal pulses.     Heart sounds: Normal heart sounds. No murmur. No friction rub. No gallop.   Pulmonary:     Effort: Pulmonary effort is normal. No respiratory distress.     Breath sounds: Normal breath sounds. No wheezing.  Abdominal:     General: Bowel sounds are normal. There is no distension or abdominal bruit.     Palpations: Abdomen is soft. There is no hepatomegaly or splenomegaly.     Tenderness: There is no abdominal tenderness. There is no right CVA tenderness or left CVA tenderness.     Hernia: No hernia is present.  Musculoskeletal:        General: Normal range of motion.     Cervical back: Normal range of motion and neck supple.     Right lower leg: No edema.     Left lower leg: No edema.  Lymphadenopathy:     Cervical: No cervical adenopathy.  Skin:    General: Skin is warm and dry.     Capillary Refill: Capillary refill takes less than 2 seconds.     Coloration: Skin is not cyanotic, jaundiced or pale.     Findings: No rash.  Neurological:     General: No focal deficit present.     Mental Status: He is alert and oriented to person, place, and time.     Cranial Nerves: Cranial nerves are intact. No cranial nerve  deficit.  Sensory: Sensation is intact. No sensory deficit.     Motor: Motor function is intact. No weakness.     Coordination: Coordination is intact. Coordination normal.     Gait: Gait is intact. Gait normal.     Deep Tendon Reflexes: Reflexes are normal and symmetric. Reflexes normal.  Psychiatric:        Attention and Perception: Attention and perception normal.        Mood and Affect: Mood and affect normal.        Speech: Speech normal.        Behavior: Behavior normal. Behavior is cooperative.        Thought Content: Thought content normal.        Cognition and Memory: Cognition and memory normal.        Judgment: Judgment normal.     Results for orders placed or performed in visit on 02/12/19  Novel Coronavirus, NAA ([facility])   Specimen: Nasopharyngeal(NP) swabs in vial transport medium   NASOPHARYNGE  TESTING  Result Value Ref Range   SARS-CoV-2, NAA Not Detected Not Detected       Pertinent labs & imaging results that were available during my care of the patient were reviewed by me and considered in my medical decision making.  Assessment & Plan:  Nagee was seen today for medical management of chronic issues, hypertension and gastroesophageal reflux.  Diagnoses and all orders for this visit:  Testosterone deficiency Has not been taking repletion therapy as he forgets. Will recheck levels today to see if this needs to be reinitiated. Does have fatigue and decreased libido, so likely deficient still.  -     Testosterone,Free and Total  Essential hypertension, benign BP well controlled. Changes were not made in regimen today. Goal BP is 130/80. Pt aware to report any persistent high or low readings. DASH diet and exercise encouraged. Exercise at least 150 minutes per week and increase as tolerated. Goal BMI > 25. Stress management encouraged. Avoid nicotine and tobacco product use. Avoid excessive alcohol and NSAID's. Avoid more than 2000 mg of sodium daily.  Medications as prescribed. Follow up as scheduled.  -     CMP14+EGFR -     CBC with Differential/Platelet -     Lipid panel -     Thyroid Panel With TSH  Gastroesophageal reflux disease without esophagitis No red flags present. Diet discussed. Avoid fried, spicy, fatty, greasy, and acidic foods. Avoid caffeine, nicotine, and alcohol. Do not eat 2-3 hours before bedtime and stay upright for at least 1-2 hours after eating. Eat small frequent meals. Avoid NSAID's like motrin and aleve. Medications as prescribed. Report any new or worsening symptoms. Follow up as discussed or sooner if needed.   -     CBC with Differential/Platelet  Mixed dyslipidemia Diet encouraged - increase intake of fresh fruits and vegetables, increase intake of lean proteins. Bake, broil, or grill foods. Avoid fried, greasy, and fatty foods. Avoid fast foods. Increase intake of fiber-rich whole grains. Exercise encouraged - at least 150 minutes per week and advance as tolerated.  Goal BMI < 25. Will discuss medications if necessary.  -     Lipid panel  Morbid obesity ([place]) Diet and exercise encouraged. Labs pending.  -     CMP14+EGFR -     CBC with Differential/Platelet -     Lipid panel -     Thyroid Panel With TSH  Vitamin D deficiency Labs pending. Continue repletion therapy. If indicated, will change repletion  dosage. Eat foods rich in Vit D including milk, orange juice, yogurt with vitamin D added, salmon or mackerel, canned tuna fish, cereals with vitamin D added, and cod liver oil. Get out in the sun but make sure to wear at least SPF 30 sunscreen.  -     VITAMIN D 25 Hydroxy (Vit-D Deficiency, Fractures)  Generalized anxiety disorder Depression, recurrent (Arthur) Doing well on below and does not wish to change at this time.  -     ALPRAZolam (XANAX) 0.25 MG tablet; TAKE 1 OR 2 TABLETS DAILY AS NEEDED  Need for pneumococcal vaccination -     Pneumococcal conjugate vaccine 13-valent   Continue all other  maintenance medications.  Follow up plan: Return in about 6 months (around 11/23/2019), or if symptoms worsen or fail to improve.  Continue healthy lifestyle choices, including diet (rich in fruits, vegetables, and lean proteins, and low in salt and simple carbohydrates) and exercise (at least 30 minutes of moderate physical activity daily).  Educational handout given for DASH diet  The above assessment and management plan was discussed with the patient. The patient verbalized understanding of and has agreed to the management plan. Patient is aware to call the clinic if they develop any new symptoms or if symptoms persist or worsen. Patient is aware when to return to the clinic for a follow-up visit. Patient educated on when it is appropriate to go to the emergency department.   Monia Pouch, FNP-C [facility] [phone]

## 2019-05-26 NOTE — Patient Instructions (Addendum)
DASH Eating Plan DASH stands for "Dietary Approaches to Stop Hypertension." The DASH eating plan is a healthy eating plan that has been shown to reduce high blood pressure (hypertension). Additional health benefits may include reducing the risk of type 2 diabetes mellitus, heart disease, and stroke. The DASH eating plan may also help with weight loss.  WHAT DO I NEED TO KNOW ABOUT THE DASH EATING PLAN? For the DASH eating plan, you will follow these general guidelines:  Choose foods with a percent daily value for sodium of less than 5% (as listed on the food label).  Use salt-free seasonings or herbs instead of table salt or sea salt.  Check with your health care provider or pharmacist before using salt substitutes.  Eat lower-sodium products, often labeled as "lower sodium" or "no salt added."  Eat fresh foods.  Eat more vegetables, fruits, and low-fat dairy products.  Choose whole grains. Look for the word "whole" as the first word in the ingredient list.  Choose fish and skinless chicken or turkey more often than red meat. Limit fish, poultry, and meat to 6 oz (170 g) each day.  Limit sweets, desserts, sugars, and sugary drinks.  Choose heart-healthy fats.  Limit cheese to 1 oz (28 g) per day.  Eat more home-cooked food and less restaurant, buffet, and fast food.  Limit fried foods.  Cook foods using methods other than frying.  Limit canned vegetables. If you do use them, rinse them well to decrease the sodium.  When eating at a restaurant, ask that your food be prepared with less salt, or no salt if possible.  WHAT FOODS CAN I EAT? Seek help from a dietitian for individual calorie needs.  Grains Whole grain or whole wheat bread. Brown rice. Whole grain or whole wheat pasta. Quinoa, bulgur, and whole grain cereals. Low-sodium cereals. Corn or whole wheat flour tortillas. Whole grain cornbread. Whole grain crackers. Low-sodium crackers.  Vegetables Fresh or frozen  vegetables (raw, steamed, roasted, or grilled). Low-sodium or reduced-sodium tomato and vegetable juices. Low-sodium or reduced-sodium tomato sauce and paste. Low-sodium or reduced-sodium canned vegetables.   Fruits All fresh, canned (in natural juice), or frozen fruits.  Meat and Other Protein Products Ground beef (85% or leaner), grass-fed beef, or beef trimmed of fat. Skinless chicken or turkey. Ground chicken or turkey. Pork trimmed of fat. All fish and seafood. Eggs. Dried beans, peas, or lentils. Unsalted nuts and seeds. Unsalted canned beans.  Dairy Low-fat dairy products, such as skim or 1% milk, 2% or reduced-fat cheeses, low-fat ricotta or cottage cheese, or plain low-fat yogurt. Low-sodium or reduced-sodium cheeses.  Fats and Oils Tub margarines without trans fats. Light or reduced-fat mayonnaise and salad dressings (reduced sodium). Avocado. Safflower, olive, or canola oils. Natural peanut or almond butter.  Other Unsalted popcorn and pretzels. The items listed above may not be a complete list of recommended foods or beverages. Contact your dietitian for more options.  WHAT FOODS ARE NOT RECOMMENDED?  Grains White bread. White pasta. White rice. Refined cornbread. Bagels and croissants. Crackers that contain trans fat.  Vegetables Creamed or fried vegetables. Vegetables in a cheese sauce. Regular canned vegetables. Regular canned tomato sauce and paste. Regular tomato and vegetable juices.  Fruits Dried fruits. Canned fruit in light or heavy syrup. Fruit juice.  Meat and Other Protein Products Fatty cuts of meat. Ribs, chicken wings, bacon, sausage, bologna, salami, chitterlings, fatback, hot dogs, bratwurst, and packaged luncheon meats. Salted nuts and seeds. Canned beans with salt.    Dairy Whole or 2% milk, cream, half-and-half, and cream cheese. Whole-fat or sweetened yogurt. Full-fat cheeses or blue cheese. Nondairy creamers and whipped toppings. Processed cheese,  cheese spreads, or cheese curds.  Condiments Onion and garlic salt, seasoned salt, table salt, and sea salt. Canned and packaged gravies. Worcestershire sauce. Tartar sauce. Barbecue sauce. Teriyaki sauce. Soy sauce, including reduced sodium. Steak sauce. Fish sauce. Oyster sauce. Cocktail sauce. Horseradish. Ketchup and mustard. Meat flavorings and tenderizers. Bouillon cubes. Hot sauce. Tabasco sauce. Marinades. Taco seasonings. Relishes.  Fats and Oils Butter, stick margarine, lard, shortening, ghee, and bacon fat. Coconut, palm kernel, or palm oils. Regular salad dressings.  Other Pickles and olives. Salted popcorn and pretzels.  The items listed above may not be a complete list of foods and beverages to avoid. Contact your dietitian for more information.  WHERE CAN I FIND MORE INFORMATION? National Heart, Lung, and [employer] Institute: www.nhlbi.nih.gov/health/health-topics/topics/dash/ Document Released: 03/09/2011 Document Revised: 08/04/2013 Document Reviewed: 01/22/2013 ExitCare Patient Information 2015 [employer]. This information is not intended to replace advice given to you by your health care provider. Make sure you discuss any questions you have with your health care provider.   I think that you would greatly benefit from seeing a nutritionist.  If you are interested, please call Dr Sykes at [phone] to schedule an appointment.   

## 2019-05-27 DIAGNOSIS — M7661 Achilles tendinitis, right leg: Secondary | ICD-10-CM | POA: Insufficient documentation

## 2019-05-27 DIAGNOSIS — M79671 Pain in right foot: Secondary | ICD-10-CM | POA: Diagnosis not present

## 2019-05-27 LAB — VITAMIN D 25 HYDROXY (VIT D DEFICIENCY, FRACTURES): Vit D, 25-Hydroxy: 36.9 ng/mL (ref 30.0–100.0)

## 2019-05-27 LAB — CMP14+EGFR
ALT: 34 IU/L (ref 0–44)
AST: 23 IU/L (ref 0–40)
Albumin/Globulin Ratio: 2 (ref 1.2–2.2)
Albumin: 4.4 g/dL (ref 3.8–4.8)
Alkaline Phosphatase: 68 IU/L (ref 39–117)
BUN/Creatinine Ratio: 19 (ref 10–24)
BUN: 21 mg/dL (ref 8–27)
Bilirubin Total: 1 mg/dL (ref 0.0–1.2)
CO2: 23 mmol/L (ref 20–29)
Calcium: 9.5 mg/dL (ref 8.6–10.2)
Chloride: 105 mmol/L (ref 96–106)
Creatinine, Ser: 1.12 mg/dL (ref 0.76–1.27)
GFR calc Af Amer: 79 mL/min/{1.73_m2} (ref >59)
GFR calc non Af Amer: 68 mL/min/{1.73_m2} (ref >59)
Globulin, Total: 2.2 g/dL (ref 1.5–4.5)
Glucose: 108 mg/dL — ABNORMAL HIGH (ref 65–99)
Potassium: 5.4 mmol/L — ABNORMAL HIGH (ref 3.5–5.2)
Sodium: 143 mmol/L (ref 134–144)
Total Protein: 6.6 g/dL (ref 6.0–8.5)

## 2019-05-27 LAB — TESTOSTERONE,FREE AND TOTAL
Testosterone, Free: 4.7 pg/mL — ABNORMAL LOW (ref 6.6–18.1)
Testosterone: 112 ng/dL — ABNORMAL LOW (ref 264–916)

## 2019-05-27 LAB — CBC WITH DIFFERENTIAL/PLATELET
Basophils Absolute: 0 10*3/uL (ref 0.0–0.2)
Basos: 1 %
EOS (ABSOLUTE): 0.3 10*3/uL (ref 0.0–0.4)
Eos: 5 %
Hematocrit: 46.4 % (ref 37.5–51.0)
Hemoglobin: 15.6 g/dL (ref 13.0–17.7)
Immature Grans (Abs): 0 10*3/uL (ref 0.0–0.1)
Immature Granulocytes: 1 %
Lymphocytes Absolute: 1.9 10*3/uL (ref 0.7–3.1)
Lymphs: 29 %
MCH: 30.1 pg (ref 26.6–33.0)
MCHC: 33.6 g/dL (ref 31.5–35.7)
MCV: 89 fL (ref 79–97)
Monocytes Absolute: 0.5 10*3/uL (ref 0.1–0.9)
Monocytes: 8 %
Neutrophils Absolute: 3.8 10*3/uL (ref 1.4–7.0)
Neutrophils: 56 %
Platelets: 317 10*3/uL (ref 150–450)
RBC: 5.19 x10E6/uL (ref 4.14–5.80)
RDW: 12.1 % (ref 11.6–15.4)
WBC: 6.6 10*3/uL (ref 3.4–10.8)

## 2019-05-27 LAB — THYROID PANEL WITH TSH
Free Thyroxine Index: 1.6 (ref 1.2–4.9)
T3 Uptake Ratio: 24 % (ref 24–39)
T4, Total: 6.5 ug/dL (ref 4.5–12.0)
TSH: 1.97 u[IU]/mL (ref 0.450–4.500)

## 2019-05-27 LAB — LIPID PANEL
Chol/HDL Ratio: 4.2 ratio (ref 0.0–5.0)
Cholesterol, Total: 196 mg/dL (ref 100–199)
HDL: 47 mg/dL (ref >39)
LDL Chol Calc (NIH): 131 mg/dL — ABNORMAL HIGH (ref 0–99)
Triglycerides: 100 mg/dL (ref 0–149)
VLDL Cholesterol Cal: 18 mg/dL (ref 5–40)

## 2019-06-03 ENCOUNTER — Other Ambulatory Visit: Payer: Self-pay | Admitting: *Deleted

## 2019-06-03 ENCOUNTER — Other Ambulatory Visit: Payer: Self-pay

## 2019-06-03 ENCOUNTER — Telehealth: Payer: Self-pay | Admitting: Family Medicine

## 2019-06-03 DIAGNOSIS — E875 Hyperkalemia: Secondary | ICD-10-CM

## 2019-06-03 NOTE — Telephone Encounter (Signed)
Pt aware - should be ok.

## 2019-06-04 ENCOUNTER — Other Ambulatory Visit (INDEPENDENT_AMBULATORY_CARE_PROVIDER_SITE_OTHER): Payer: Medicare Other

## 2019-06-04 DIAGNOSIS — E349 Endocrine disorder, unspecified: Secondary | ICD-10-CM | POA: Diagnosis not present

## 2019-06-04 DIAGNOSIS — E875 Hyperkalemia: Secondary | ICD-10-CM | POA: Diagnosis not present

## 2019-06-04 LAB — BMP8+EGFR
BUN/Creatinine Ratio: 15 (ref 10–24)
BUN: 18 mg/dL (ref 8–27)
CO2: 21 mmol/L (ref 20–29)
Calcium: 9.9 mg/dL (ref 8.6–10.2)
Chloride: 104 mmol/L (ref 96–106)
Creatinine, Ser: 1.21 mg/dL (ref 0.76–1.27)
GFR calc Af Amer: 72 mL/min/{1.73_m2} (ref >59)
GFR calc non Af Amer: 62 mL/min/{1.73_m2} (ref >59)
Glucose: 123 mg/dL — ABNORMAL HIGH (ref 65–99)
Potassium: 4.8 mmol/L (ref 3.5–5.2)
Sodium: 140 mmol/L (ref 134–144)

## 2019-06-04 NOTE — Progress Notes (Signed)
Testosterone injection to left upper outer quadrant.  Patient tolerated well.

## 2019-06-11 ENCOUNTER — Telehealth: Payer: Self-pay | Admitting: Family Medicine

## 2019-06-11 ENCOUNTER — Other Ambulatory Visit: Payer: Self-pay | Admitting: Family Medicine

## 2019-06-11 DIAGNOSIS — R05 Cough: Secondary | ICD-10-CM

## 2019-06-11 DIAGNOSIS — R059 Cough, unspecified: Secondary | ICD-10-CM

## 2019-06-11 MED ORDER — BENZONATATE 100 MG PO CAPS: 100.0000 mg | ORAL_CAPSULE | Freq: Three times a day (TID) | ORAL | 11 refills | Status: DC | PRN

## 2019-06-11 NOTE — Telephone Encounter (Signed)
Yes, that will be fine. I will handle it. Thanks.

## 2019-06-11 NOTE — Telephone Encounter (Signed)
Lmtcb/ww  

## 2019-06-11 NOTE — Telephone Encounter (Signed)
Do you want [place] to send in the 100mg 

## 2019-06-11 NOTE — Telephone Encounter (Signed)
Patient states that his last rx for tessalon pearls was in 2019.  States [facility] would give them to him time to time due to cough.  States his allergies are bothering him and he has been coughing.  Please advise on refill.

## 2019-06-11 NOTE — Telephone Encounter (Signed)
  Medication Request  06/11/2019  What is the name of the medication? Tessalon pearl cough med  Have you contacted your pharmacy to request a refill?no  Which pharmacy would you like this sent to?[facility]   Patient notified that their request is being sent to the clinical staff for review and that they should receive a call once it is complete. If they do not receive a call within 24 hours they can check with their pharmacy or our office.

## 2019-06-11 NOTE — Telephone Encounter (Signed)
Ok to refill if he has no other symptoms. Tell him to make sure he is taking an antihistamine such as Claritin every night.

## 2019-06-11 NOTE — Telephone Encounter (Signed)
Patient aware.

## 2019-07-03 ENCOUNTER — Other Ambulatory Visit: Payer: Self-pay | Admitting: Family Medicine

## 2019-07-03 DIAGNOSIS — E349 Endocrine disorder, unspecified: Secondary | ICD-10-CM

## 2019-07-03 NOTE — Telephone Encounter (Signed)
Last office visit 05/26/2019  Last refill 01/07/2019, 10 ml, 1 refill

## 2019-07-07 ENCOUNTER — Other Ambulatory Visit: Payer: Self-pay

## 2019-07-07 ENCOUNTER — Ambulatory Visit (INDEPENDENT_AMBULATORY_CARE_PROVIDER_SITE_OTHER): Payer: Medicare Other

## 2019-07-07 DIAGNOSIS — E349 Endocrine disorder, unspecified: Secondary | ICD-10-CM

## 2019-07-07 NOTE — Progress Notes (Signed)
Testosterone injection given to right upper outer quadrant.  Patient tolerated well. 

## 2019-07-17 ENCOUNTER — Encounter: Payer: Self-pay | Admitting: *Deleted

## 2019-07-23 ENCOUNTER — Other Ambulatory Visit: Payer: Self-pay | Admitting: Family Medicine

## 2019-08-11 ENCOUNTER — Other Ambulatory Visit: Payer: Self-pay | Admitting: *Deleted

## 2019-08-11 MED ORDER — MOMETASONE FUROATE 0.1 % EX CREA: TOPICAL_CREAM | CUTANEOUS | 1 refills | Status: DC

## 2019-08-15 ENCOUNTER — Other Ambulatory Visit: Payer: Self-pay | Admitting: *Deleted

## 2019-08-15 MED ORDER — CETIRIZINE HCL 10 MG PO TABS: 10.0000 mg | ORAL_TABLET | Freq: Every day | ORAL | 1 refills | Status: DC

## 2019-11-24 ENCOUNTER — Encounter: Payer: Self-pay | Admitting: Family Medicine

## 2019-11-24 ENCOUNTER — Other Ambulatory Visit: Payer: Self-pay

## 2019-11-24 ENCOUNTER — Ambulatory Visit (INDEPENDENT_AMBULATORY_CARE_PROVIDER_SITE_OTHER): Payer: Medicare Other | Admitting: Family Medicine

## 2019-11-24 ENCOUNTER — Ambulatory Visit: Payer: Medicare Other | Admitting: Family Medicine

## 2019-11-24 VITALS — BP 137/89 | HR 71 | Temp 98.0°F | Ht 73.0 in | Wt 312.0 lb

## 2019-11-24 DIAGNOSIS — E349 Endocrine disorder, unspecified: Secondary | ICD-10-CM

## 2019-11-24 DIAGNOSIS — E559 Vitamin D deficiency, unspecified: Secondary | ICD-10-CM

## 2019-11-24 DIAGNOSIS — F411 Generalized anxiety disorder: Secondary | ICD-10-CM

## 2019-11-24 DIAGNOSIS — Z79899 Other long term (current) drug therapy: Secondary | ICD-10-CM

## 2019-11-24 DIAGNOSIS — I10 Essential (primary) hypertension: Secondary | ICD-10-CM

## 2019-11-24 DIAGNOSIS — E782 Mixed hyperlipidemia: Secondary | ICD-10-CM | POA: Diagnosis not present

## 2019-11-24 DIAGNOSIS — K219 Gastro-esophageal reflux disease without esophagitis: Secondary | ICD-10-CM | POA: Diagnosis not present

## 2019-11-24 DIAGNOSIS — F339 Major depressive disorder, recurrent, unspecified: Secondary | ICD-10-CM | POA: Diagnosis not present

## 2019-11-24 MED ORDER — VITAMIN D (ERGOCALCIFEROL) 1.25 MG (50000 UNIT) PO CAPS: ORAL_CAPSULE | ORAL | 5 refills | Status: DC

## 2019-11-24 MED ORDER — ALPRAZOLAM 0.25 MG PO TABS: ORAL_TABLET | ORAL | 5 refills | Status: DC

## 2019-11-24 MED ORDER — LISINOPRIL 20 MG PO TABS: 20.0000 mg | ORAL_TABLET | Freq: Every day | ORAL | 3 refills | Status: DC

## 2019-11-24 MED ORDER — ESCITALOPRAM OXALATE 20 MG PO TABS: 20.0000 mg | ORAL_TABLET | Freq: Every day | ORAL | 3 refills | Status: DC

## 2019-11-24 NOTE — Progress Notes (Signed)
BP 137/89    Pulse 71    Temp 98 F (36.7 C)    Ht '6\' 1"'  (1.854 m)    Wt (!) 312 lb (141.5 kg)    SpO2 95%    BMI 41.16 kg/m    Subjective:   Patient ID: Arthur Foster, male    DOB: 28-Oct-1950, 69 y.o.   MRN: [id number]  HPI: Arthur Foster is a 69 y.o. male presenting on 11/24/2019 for Medical Management of Chronic Issues (29m   HPI GERD Patient is currently on no medication currently and has been doing well, says has not had any issues per year..  She denies any major symptoms or abdominal pain or belching or burping. She denies any blood in her stool or lightheadedness or dizziness.   Hypertension Patient is currently on lisinopril, and their blood pressure today is 137/89. Patient denies any lightheadedness or dizziness. Patient denies headaches, blurred vision, chest pains, shortness of breath, or weakness. Denies any side effects from medication and is content with current medication.   Hyperlipidemia Patient is coming in for recheck of his hyperlipidemia. The patient is currently taking no medication. They deny any issues with myalgias or history of liver damage from it. They deny any focal numbness or weakness or chest pain.   Patient is coming in for vitamin D deficiency recheck  Patient is coming in for anxiety and depression recheck and testosterone deficiency recheck Wife passed away in [id number] that is when he started having issues.  She had long term health issues. He has his own business. Takes alprazolam at night for sleep and anxiety and occasional in the am.  Mind races.  Current rx-Lexapro 20 and alprazolam 0.251 or 2 daily as needed # meds rx-45 Effectiveness of current meds-works well Adverse reactions form meds-none currently  Pill count performed-No Last drug screen -1 year ago ( high risk q387mmoderate risk q6m50mow risk yearly ) Urine drug screen today- Yes [place] [facility] [place]  If yes were their any concerning findings? -None  No flowsheet data  found.   Controlled substance contract signed on: Today  Relevant past medical, surgical, family and social history reviewed and updated as indicated. Interim medical history since our last visit reviewed. Allergies and medications reviewed and updated.  Review of Systems  Constitutional: Negative for chills and fever.  Eyes: Negative for visual disturbance.  Respiratory: Negative for shortness of breath and wheezing.   Cardiovascular: Negative for chest pain and leg swelling.  Musculoskeletal: Negative for back pain and gait problem.  Skin: Negative for rash.  Psychiatric/Behavioral: Positive for dysphoric mood and sleep disturbance. Negative for self-injury and suicidal ideas. The patient is nervous/anxious.   All other systems reviewed and are negative.   Per HPI unless specifically indicated above   Allergies as of 11/24/2019      Reactions   Ambien [zolpidem Tartrate] Other (See Comments)      Medication List       Accurate as of November 24, 2019  9:09 AM. If you have any questions, ask your nurse or doctor.        STOP taking these medications   mometasone 0.1 % cream Commonly known as: ELOCON Stopped by: JosWorthy RancherD   naproxen 500 MG tablet Commonly known as: NAPROSYN Stopped by: JosWorthy RancherD   testosterone cypionate 200 MG/ML injection Commonly known as: DEPOTESTOSTERONE CYPIONATE Stopped by: JosWorthy RancherD     TAKE these medications  ALPRAZolam 0.25 MG tablet Commonly known as: XANAX TAKE 1 OR 2 TABLETS DAILY AS NEEDED   benzonatate 100 MG capsule Commonly known as: Tessalon Perles Take 1 capsule (100 mg total) by mouth 3 (three) times daily as needed for cough.   cetirizine 10 MG tablet Commonly known as: ZYRTEC Take 1 tablet (10 mg total) by mouth daily.   escitalopram 20 MG tablet Commonly known as: LEXAPRO Take 1 tablet (20 mg total) by mouth daily.   lisinopril 20 MG tablet Commonly known as: ZESTRIL Take 1  tablet (20 mg total) by mouth daily.   vitamin C 1000 MG tablet Take 1,000 mg by mouth daily.   Vitamin D (Ergocalciferol) 1.25 MG (50000 UNIT) Caps capsule Commonly known as: DRISDOL Take 1 capsule (50,000 Units total) by mouth once a week.        Objective:   BP 137/89    Pulse 71    Temp 98 F (36.7 C)    Ht '6\' 1"'  (1.854 m)    Wt (!) 312 lb (141.5 kg)    SpO2 95%    BMI 41.16 kg/m   Wt Readings from Last 3 Encounters:  11/24/19 (!) 312 lb (141.5 kg)  05/26/19 (!) 320 lb (145.2 kg)  11/22/18 (!) 320 lb (145.2 kg)    Physical Exam Vitals and nursing note reviewed.  Constitutional:      General: He is not in acute distress.    Appearance: He is well-developed. He is not diaphoretic.  Eyes:     General: No scleral icterus.    Conjunctiva/sclera: Conjunctivae normal.  Neck:     Thyroid: No thyromegaly.  Cardiovascular:     Rate and Rhythm: Normal rate and regular rhythm.     Heart sounds: Normal heart sounds. No murmur heard.   Pulmonary:     Effort: Pulmonary effort is normal. No respiratory distress.     Breath sounds: Normal breath sounds. No wheezing.  Musculoskeletal:        General: Normal range of motion.     Cervical back: Neck supple.  Lymphadenopathy:     Cervical: No cervical adenopathy.  Skin:    General: Skin is warm and dry.     Findings: No rash.  Neurological:     Mental Status: He is alert and oriented to person, place, and time.     Coordination: Coordination normal.  Psychiatric:        Behavior: Behavior normal.       Assessment & Plan:   Problem List Items Addressed This Visit      Cardiovascular and Mediastinum   Essential hypertension, benign   Relevant Medications   lisinopril (ZESTRIL) 20 MG tablet   Other Relevant Orders   CMP14+EGFR     Digestive   GERD (gastroesophageal reflux disease)   Relevant Orders   CBC with Differential/Platelet     Other   Depression, recurrent (HCC)   Relevant Medications   escitalopram  (LEXAPRO) 20 MG tablet   ALPRAZolam (XANAX) 0.25 MG tablet   Other Relevant Orders   ToxASSURE Select 13 (MW), Urine   Testosterone deficiency   Generalized anxiety disorder   Relevant Medications   escitalopram (LEXAPRO) 20 MG tablet   ALPRAZolam (XANAX) 0.25 MG tablet   Other Relevant Orders   ToxASSURE Select 13 (MW), Urine   Mixed dyslipidemia - Primary   Relevant Orders   Lipid panel   Controlled substance agreement signed   Relevant Orders   ToxASSURE Select 13 (MW),  Urine   Vitamin D deficiency   Relevant Medications   Vitamin D, Ergocalciferol, (DRISDOL) 1.25 MG (50000 UNIT) CAPS capsule   Other Relevant Orders   VITAMIN D 25 Hydroxy (Vit-D Deficiency, Fractures)      Continue current medication, seems to be doing well, follow-up with PCP in the future.  He will call an orthopedic for his knee joint problems Follow up plan: Return in about 6 months (around 05/26/2020), or if symptoms worsen or fail to improve, for f/u pcp.  Counseling provided for all of the vaccine components Orders Placed This Encounter  Procedures   ToxASSURE Select 13 (MW), Urine   CBC with Differential/Platelet   Lipid panel   CMP14+EGFR   VITAMIN D 25 Hydroxy (Vit-D Deficiency, Fractures)    Caryl Pina, MD [facility] 11/24/2019, 9:09 AM

## 2019-11-25 LAB — CMP14+EGFR
ALT: 27 IU/L (ref 0–44)
AST: 21 IU/L (ref 0–40)
Albumin/Globulin Ratio: 1.6 (ref 1.2–2.2)
Albumin: 4.4 g/dL (ref 3.8–4.8)
Alkaline Phosphatase: 72 IU/L (ref 48–121)
BUN/Creatinine Ratio: 17 (ref 10–24)
BUN: 16 mg/dL (ref 8–27)
Bilirubin Total: 1.1 mg/dL (ref 0.0–1.2)
CO2: 26 mmol/L (ref 20–29)
Calcium: 9.6 mg/dL (ref 8.6–10.2)
Chloride: 103 mmol/L (ref 96–106)
Creatinine, Ser: 0.95 mg/dL (ref 0.76–1.27)
GFR calc Af Amer: 96 mL/min/{1.73_m2} (ref >59)
GFR calc non Af Amer: 83 mL/min/{1.73_m2} (ref >59)
Globulin, Total: 2.8 g/dL (ref 1.5–4.5)
Glucose: 105 mg/dL — ABNORMAL HIGH (ref 65–99)
Potassium: 5.2 mmol/L (ref 3.5–5.2)
Sodium: 140 mmol/L (ref 134–144)
Total Protein: 7.2 g/dL (ref 6.0–8.5)

## 2019-11-25 LAB — CBC WITH DIFFERENTIAL/PLATELET
Basophils Absolute: 0.1 10*3/uL (ref 0.0–0.2)
Basos: 1 %
EOS (ABSOLUTE): 0.3 10*3/uL (ref 0.0–0.4)
Eos: 5 %
Hematocrit: 46.6 % (ref 37.5–51.0)
Hemoglobin: 15.2 g/dL (ref 13.0–17.7)
Immature Grans (Abs): 0.1 10*3/uL (ref 0.0–0.1)
Immature Granulocytes: 1 %
Lymphocytes Absolute: 1.8 10*3/uL (ref 0.7–3.1)
Lymphs: 16 %
MCH: 29.9 pg (ref 26.6–33.0)
MCHC: 32.6 g/dL (ref 31.5–35.7)
MCV: 92 fL (ref 79–97)
Monocytes Absolute: 0.5 10*3/uL (ref 0.1–0.9)
Monocytes: 10 %
Neutrophils Absolute: 4.1 10*3/uL (ref 1.4–7.0)
Neutrophils: 67 %
Platelets: 340 10*3/uL (ref 150–450)
RBC: 5.08 x10E6/uL (ref 4.14–5.80)
RDW: 13.2 % (ref 11.6–15.4)
WBC: 6.9 10*3/uL (ref 3.4–10.8)

## 2019-11-25 LAB — LIPID PANEL
Chol/HDL Ratio: 4.1 ratio (ref 0.0–5.0)
Cholesterol, Total: 209 mg/dL — ABNORMAL HIGH (ref 100–199)
HDL: 51 mg/dL (ref >39)
LDL Chol Calc (NIH): 132 mg/dL — ABNORMAL HIGH (ref 0–99)
Triglycerides: 144 mg/dL (ref 0–149)
VLDL Cholesterol Cal: 26 mg/dL (ref 5–40)

## 2019-11-25 LAB — VITAMIN D 25 HYDROXY (VIT D DEFICIENCY, FRACTURES): Vit D, 25-Hydroxy: 31.6 ng/mL (ref 30.0–100.0)

## 2019-11-27 LAB — TOXASSURE SELECT 13 (MW), URINE

## 2019-12-23 ENCOUNTER — Ambulatory Visit (INDEPENDENT_AMBULATORY_CARE_PROVIDER_SITE_OTHER): Payer: Medicare Other | Admitting: Family Medicine

## 2019-12-23 ENCOUNTER — Encounter: Payer: Self-pay | Admitting: Family Medicine

## 2019-12-23 DIAGNOSIS — J069 Acute upper respiratory infection, unspecified: Secondary | ICD-10-CM

## 2019-12-23 DIAGNOSIS — R05 Cough: Secondary | ICD-10-CM

## 2019-12-23 DIAGNOSIS — R059 Cough, unspecified: Secondary | ICD-10-CM

## 2019-12-23 NOTE — Addendum Note (Signed)
Addended by: Earlene Plater on: 12/23/2019 09:26 AM   Modules accepted: Orders

## 2019-12-23 NOTE — Progress Notes (Signed)
Virtual Visit via Telephone Note  I connected with Arthur Foster on 12/23/19 at 9:02 AM by telephone and verified that I am speaking with the correct person using two identifiers. Arthur Foster is currently located at home and nobody is currently with him during this visit. The provider, Loman Brooklyn, FNP is located in their office at time of visit.  I discussed the limitations, risks, security and privacy concerns of performing an evaluation and management service by telephone and the availability of in person appointments. I also discussed with the patient that there may be a patient responsible charge related to this service. The patient expressed understanding and agreed to proceed.  Subjective: PCP: Loman Brooklyn, FNP  Chief Complaint  Patient presents with  . Cough   Patient complains of cough. Additional symptoms include head congestion, runny nose, sore throat, postnasal drainage and diarrhea. Onset of symptoms was 3 days ago, unchanged since that time. He is drinking plenty of fluids and urinating per his usual. Evaluation to date: none. Treatment to date: antihistamines, nasal steroids, Dayquil, Nyquil, and cough drops. He has a history of allergies. He does not smoke.    ROS: Per HPI  Current Outpatient Medications:  .  ALPRAZolam (XANAX) 0.25 MG tablet, TAKE 1 OR 2 TABLETS DAILY AS NEEDED, Disp: 45 tablet, Rfl: 5 .  Ascorbic Acid (VITAMIN C) 1000 MG tablet, Take 1,000 mg by mouth daily., Disp: , Rfl:  .  benzonatate (TESSALON PERLES) 100 MG capsule, Take 1 capsule (100 mg total) by mouth 3 (three) times daily as needed for cough., Disp: 20 capsule, Rfl: 11 .  cetirizine (ZYRTEC) 10 MG tablet, Take 1 tablet (10 mg total) by mouth daily., Disp: 90 tablet, Rfl: 1 .  escitalopram (LEXAPRO) 20 MG tablet, Take 1 tablet (20 mg total) by mouth daily., Disp: 90 tablet, Rfl: 3 .  lisinopril (ZESTRIL) 20 MG tablet, Take 1 tablet (20 mg total) by mouth daily., Disp: 90 tablet, Rfl: 3 .   Vitamin D, Ergocalciferol, (DRISDOL) 1.25 MG (50000 UNIT) CAPS capsule, Take 1 capsule (50,000 Units total) by mouth once a week., Disp: 13 capsule, Rfl: 5  Allergies  Allergen Reactions  . Ambien [Zolpidem Tartrate] Other (See Comments)   Past Medical History:  Diagnosis Date  . Anxiety   . BPH (benign prostatic hyperplasia)   . Depression   . Hyperlipidemia   . Hypertension   . Testosterone deficiency     Observations/Objective: A&O  No respiratory distress or wheezing audible over the phone Mood, judgement, and thought processes all WNL  Assessment and Plan: 1. Viral URI - Discussed symptom management.   2. Cough - Novel Coronavirus, NAA ([facility]); Future   Follow Up Instructions:  I discussed the assessment and treatment plan with the patient. The patient was provided an opportunity to ask questions and all were answered. The patient agreed with the plan and demonstrated an understanding of the instructions.   The patient was advised to call back or seek an in-person evaluation if the symptoms worsen or if the condition fails to improve as anticipated.  The above assessment and management plan was discussed with the patient. The patient verbalized understanding of and has agreed to the management plan. Patient is aware to call the clinic if symptoms persist or worsen. Patient is aware when to return to the clinic for a follow-up visit. Patient educated on when it is appropriate to go to the emergency department.   Time call ended: 9:12 AM  I provided 12 minutes of non-face-to-face time during this encounter.  Hendricks Limes, MSN, APRN, FNP-C [facility] 12/23/19

## 2019-12-25 ENCOUNTER — Telehealth: Payer: Self-pay | Admitting: Family Medicine

## 2019-12-25 LAB — NOVEL CORONAVIRUS, NAA: SARS-CoV-2, NAA: DETECTED — AB

## 2019-12-25 LAB — SARS-COV-2, NAA 2 DAY TAT

## 2019-12-25 NOTE — Telephone Encounter (Signed)
Pt says he was tested for COVID on Monday and would like someone to call him with his results asap because his wife now has whatever sickness he has.

## 2019-12-25 NOTE — Telephone Encounter (Signed)
Patient aware results are not back and we will call him when they are.

## 2019-12-26 ENCOUNTER — Other Ambulatory Visit: Payer: Self-pay | Admitting: Infectious Diseases

## 2019-12-26 ENCOUNTER — Ambulatory Visit (HOSPITAL_COMMUNITY)
Admission: RE | Admit: 2019-12-26 | Discharge: 2019-12-26 | Disposition: A | Discharge status: Home / Self Care | Payer: Medicare Other | Source: Ambulatory Visit | Attending: Pulmonary Disease | Admitting: Pulmonary Disease

## 2019-12-26 ENCOUNTER — Telehealth: Payer: Self-pay | Admitting: Infectious Diseases

## 2019-12-26 DIAGNOSIS — U071 COVID-19: Secondary | ICD-10-CM | POA: Diagnosis not present

## 2019-12-26 DIAGNOSIS — Z23 Encounter for immunization: Secondary | ICD-10-CM | POA: Diagnosis not present

## 2019-12-26 DIAGNOSIS — I10 Essential (primary) hypertension: Secondary | ICD-10-CM

## 2019-12-26 MED ORDER — SODIUM CHLORIDE 0.9 % IV SOLN
1200.0000 mg | Freq: Once | INTRAVENOUS | Status: AC
  Administered 2019-12-26: 1200 mg via INTRAVENOUS

## 2019-12-26 MED ORDER — ALBUTEROL SULFATE HFA 108 (90 BASE) MCG/ACT IN AERS: 2.0000 | INHALATION_SPRAY | Freq: Once | RESPIRATORY_TRACT | Status: DC | PRN

## 2019-12-26 MED ORDER — EPINEPHRINE 0.3 MG/0.3ML IJ SOAJ: 0.3000 mg | Freq: Once | INTRAMUSCULAR | Status: DC | PRN

## 2019-12-26 MED ORDER — DIPHENHYDRAMINE HCL 50 MG/ML IJ SOLN: 50.0000 mg | Freq: Once | INTRAMUSCULAR | Status: DC | PRN

## 2019-12-26 MED ORDER — FAMOTIDINE IN NACL 20-0.9 MG/50ML-% IV SOLN: 20.0000 mg | Freq: Once | INTRAVENOUS | Status: DC | PRN

## 2019-12-26 MED ORDER — METHYLPREDNISOLONE SODIUM SUCC 125 MG IJ SOLR: 125.0000 mg | Freq: Once | INTRAMUSCULAR | Status: DC | PRN

## 2019-12-26 MED ORDER — SODIUM CHLORIDE 0.9 % IV SOLN: INTRAVENOUS | Status: DC | PRN

## 2019-12-26 NOTE — Progress Notes (Signed)
°  Diagnosis: COVID-19  Physician: Dr Joya Gaskins  Procedure: Covid Infusion Clinic Med: casirivimab\imdevimab infusion - Provided patient with casirivimab\imdevimab fact sheet for patients, parents and caregivers prior to infusion.  Complications: No immediate complications noted.  Discharge: Discharged home   Arthur Foster 12/26/2019

## 2019-12-26 NOTE — Discharge Instructions (Signed)

## 2019-12-26 NOTE — Telephone Encounter (Signed)
Called to Discuss with patient about Covid symptoms and the use of the monoclonal antibody infusion for those with mild to moderate Covid symptoms and at a high risk of hospitalization.     Pt appears to qualify for this infusion due to co-morbid conditions and/or a member of an at-risk group in accordance with the FDA Emergency Use Authorization.    Chart review noted for sx onset noted ~9/18  Unable to reach patient - LVM and MyChart sent

## 2019-12-26 NOTE — Telephone Encounter (Signed)
Wife is sick today also - same symptoms.  Not yet been tested yet.  She was light headed and dizzy this morning when she woke up and walked into the kitchen.   Will get him and his wife set up as well.

## 2019-12-26 NOTE — Progress Notes (Signed)
I connected by phone with Arthur Foster on 12/26/2019 at 10:22 AM to discuss the potential use of a new treatment for mild to moderate COVID-19 viral infection in non-hospitalized patients.  This patient is a 69 y.o. male that meets the FDA criteria for Emergency Use Authorization of COVID monoclonal antibody casirivimab/imdevimab or bamlanivimab/eteseviamb.  Has a (+) direct SARS-CoV-2 viral test result  Has mild or moderate COVID-19   Is NOT hospitalized due to COVID-19  Is within 10 days of symptom onset  Has at least one of the high risk factor(s) for progression to severe COVID-19 and/or hospitalization as defined in EUA.  Specific high risk criteria : BMI > 25 and Cardiovascular disease or hypertension   I have spoken and communicated the following to the patient or parent/caregiver regarding COVID monoclonal antibody treatment:  1. FDA has authorized the emergency use for the treatment of mild to moderate COVID-19 in adults and pediatric patients with positive results of direct SARS-CoV-2 viral testing who are 68 years of age and older weighing at least 40 kg, and who are at high risk for progressing to severe COVID-19 and/or hospitalization.  2. The significant known and potential risks and benefits of COVID monoclonal antibody, and the extent to which such potential risks and benefits are unknown.  3. Information on available alternative treatments and the risks and benefits of those alternatives, including clinical trials.  4. Patients treated with COVID monoclonal antibody should continue to self-isolate and use infection control measures (e.g., wear mask, isolate, social distance, avoid sharing personal items, clean and disinfect "high touch" surfaces, and frequent handwashing) according to CDC guidelines.   5. The patient or parent/caregiver has the option to accept or refuse COVID monoclonal antibody treatment.  After reviewing this information with the patient, the patient  has agreed to receive one of the available covid 19 monoclonal antibodies and will be provided an appropriate fact sheet prior to infusion. Janene Madeira, NP 12/26/2019 10:22 AM

## 2020-02-05 ENCOUNTER — Ambulatory Visit: Payer: Medicare Other

## 2020-02-12 ENCOUNTER — Other Ambulatory Visit: Payer: Self-pay | Admitting: Family

## 2020-03-01 ENCOUNTER — Encounter: Payer: Self-pay | Admitting: Nurse Practitioner

## 2020-03-01 ENCOUNTER — Telehealth: Payer: Self-pay

## 2020-03-01 ENCOUNTER — Other Ambulatory Visit: Payer: Self-pay

## 2020-03-01 ENCOUNTER — Ambulatory Visit (INDEPENDENT_AMBULATORY_CARE_PROVIDER_SITE_OTHER): Payer: Medicare Other | Admitting: Nurse Practitioner

## 2020-03-01 VITALS — BP 158/90 | HR 63 | Temp 97.8°F | Resp 20 | Ht 73.0 in | Wt 317.0 lb

## 2020-03-01 DIAGNOSIS — M25562 Pain in left knee: Secondary | ICD-10-CM

## 2020-03-01 MED ORDER — BUPIVACAINE HCL 0.25 % IJ SOLN
1.0000 mL | Freq: Once | INTRAMUSCULAR | Status: AC
  Administered 2020-03-01: 1 mL via INTRA_ARTICULAR

## 2020-03-01 MED ORDER — DICLOFENAC SODIUM 1 % EX GEL: 4.0000 g | Freq: Four times a day (QID) | CUTANEOUS | 4 refills | Status: DC

## 2020-03-01 MED ORDER — NAPROXEN 500 MG PO TABS: 500.0000 mg | ORAL_TABLET | Freq: Two times a day (BID) | ORAL | 1 refills | Status: DC

## 2020-03-01 MED ORDER — METHYLPREDNISOLONE ACETATE 40 MG/ML IJ SUSP
40.0000 mg | Freq: Once | INTRAMUSCULAR | Status: AC
  Administered 2020-03-01: 40 mg via INTRAMUSCULAR

## 2020-03-01 NOTE — Telephone Encounter (Signed)
Pt called stating that he has a history of bad knee and getting fluid on his knees. Pt says his LT knee feels like there is fluid on it again and its causing him a lot of pain and really needs a Cortizone shot ASAP.  Pt says he was told a few years ago that he would eventually need to have surgery on his knee and pt feels like it may be that time.  Pt is going to see a foot specialist on wed regarding his right foot but says after that he is going to see a knee specialist about his knee.  Says he just needs cortizone shot to help him with pain until he can get his knee taken care of.  Please would like someone to give him shot ASAP. Please advise and call pt.

## 2020-03-01 NOTE — Patient Instructions (Signed)
Knee Injection A knee injection is a procedure to get medicine into your knee joint to relieve the pain, swelling, and stiffness of arthritis. Your health care provider uses a needle to inject medicine, which may also help to lubricate and cushion your knee joint. You may need more than one injection. Tell a health care provider about:  Any allergies you have.  All medicines you are taking, including vitamins, herbs, eye drops, creams, and over-the-counter medicines.  Any problems you or family members have had with anesthetic medicines.  Any blood disorders you have.  Any surgeries you have had.  Any medical conditions you have.  Whether you are pregnant or may be pregnant. What are the risks? Generally, this is a safe procedure. However, problems may occur, including:  Infection.  Bleeding.  Symptoms that get worse.  Damage to the area around your knee.  Allergic reaction to any of the medicines.  Skin reactions from repeated injections. What happens before the procedure?  Ask your health care provider about changing or stopping your regular medicines. This is especially important if you are taking diabetes medicines or blood thinners.  Plan to have someone take you home from the hospital or clinic. What happens during the procedure?   You will sit or lie down in a position for your knee to be treated.  The skin over your kneecap will be cleaned with a germ-killing soap.  You will be given a medicine that numbs the area (local anesthetic). You may feel some stinging.  The medicine will be injected into your knee. The needle is carefully placed between your kneecap and your knee. The medicine is injected into the joint space.  The needle will be removed at the end of the procedure.  A bandage (dressing) may be placed over the injection site. The procedure may vary among health care providers and hospitals. What can I expect after the procedure?  Your blood  pressure, heart rate, breathing rate, and blood oxygen level will be monitored until you leave the hospital or clinic.  You may have to move your knee through its full range of motion. This helps to get all the medicine into your joint space.  You will be watched to make sure that you do not have a reaction to the injected medicine.  You may feel more pain, swelling, and warmth than you did before the injection. This reaction may last about 1-2 days. Follow these instructions at home: Medicines  Take over-the-counter and prescription medicines only as told by your doctor.  Do not drive or use heavy machinery while taking prescription pain medicine.  Do not take medicines such as aspirin and ibuprofen unless your health care provider tells you to take them. Injection site care  Follow instructions from your health care provider about: ? How to take care of your puncture site. ? When and how you should change your dressing. ? When you should remove your dressing.  Check your injection area every day for signs of infection. Check for: ? More redness, swelling, or pain after 2 days. ? Fluid or blood. ? Pus or a bad smell. ? Warmth. Managing pain, stiffness, and swelling   If directed, put ice on the injection area: ? Put ice in a plastic bag. ? Place a towel between your skin and the bag. ? Leave the ice on for 20 minutes, 2-3 times per day.  Do not apply heat to your knee.  Raise (elevate) the injection area above the level   of your heart while you are sitting or lying down. General instructions  If you were given a dressing, keep it dry until your health care provider says it can be removed. Ask your health care provider when you can start showering or taking a bath.  Avoid strenuous activities for as long as directed by your health care provider. Ask your health care provider when you can return to your normal activities.  Keep all follow-up visits as told by your health  care provider. This is important. You may need more injections. Contact a health care provider if you have:  A fever.  Warmth in your injection area.  Fluid, blood, or pus coming from your injection site.  Symptoms at your injection site that last longer than 2 days after your procedure. Get help right away if:  Your knee: ? Turns very red. ? Becomes very swollen. ? Is in severe pain. Summary  A knee injection is a procedure to get medicine into your knee joint to relieve the pain, swelling, and stiffness of arthritis.  A needle is carefully placed between your kneecap and your knee to inject medicine into the joint space.  Before the procedure, ask your health care provider about changing or stopping your regular medicines, especially if you are taking diabetes medicines or blood thinners.  Contact your health care provider if you have any problems or questions after your procedure. This information is not intended to replace advice given to you by your health care provider. Make sure you discuss any questions you have with your health care provider. Document Revised: 04/09/2017 Document Reviewed: 04/09/2017 Elsevier Patient Education  2020 [employer]  

## 2020-03-01 NOTE — Telephone Encounter (Signed)
Appointment scheduled.

## 2020-03-01 NOTE — Addendum Note (Signed)
Addended by: Chevis Pretty on: 03/01/2020 04:17 PM   Modules accepted: Orders

## 2020-03-01 NOTE — Progress Notes (Signed)
   Subjective:    Patient ID: Arthur Foster, male    DOB: 14-Jul-1950, 69 y.o.   MRN: [id number]   Chief Complaint: Knee Pain   HPI Patient comes in c/o knee pain. This has been intermittent for many years. He has had it drained and injected and that helped for several years. Pain is worse when going up and down steps. Has clicking noise with some movement.   Review of Systems  Musculoskeletal: Positive for arthralgias (left knee).  All other systems reviewed and are negative.       Objective:   Physical Exam Vitals and nursing note reviewed.  Constitutional:      Appearance: Normal appearance.  Cardiovascular:     Rate and Rhythm: Normal rate and regular rhythm.     Heart sounds: Normal heart sounds.  Pulmonary:     Breath sounds: Normal breath sounds.  Musculoskeletal:     Comments: From of left knee with pain on flexion and extension Mild effusion  Skin:    General: Skin is warm.  Neurological:     General: No focal deficit present.     Mental Status: He is alert and oriented to person, place, and time.  Psychiatric:        Mood and Affect: Mood normal.        Behavior: Behavior normal.     BP (!) 158/90   Pulse 63   Temp 97.8 F (36.6 C) (Temporal)   Resp 20   Ht 6\' 1"  (1.854 m)   Wt (!) 317 lb (143.8 kg)   SpO2 96%   BMI 41.82 kg/m   Joint Injection/Arthrocentesis  Date/Time: 03/01/2020 4:07 PM Performed by: Chevis Pretty, FNP Authorized by: Hassell Done, Mary-Margaret, FNP  Indications: joint swelling and pain  Body area: knee Local anesthesia used: no  Anesthesia: Local anesthesia used: no  Sedation: Patient sedated: no  Preparation: Patient was prepped and draped in the usual sterile fashion. Needle size: 22 G Ultrasound guidance: no Approach: superior Aspirate: serous Methylprednisolone amount: 40 mg Bupivacaine 0.25% amount: 1 mL Patient tolerance: patient tolerated the procedure well with no immediate complications           Assessment & Plan:  Arthur Foster in today with chief complaint of Knee Pain   1. Acute pain of left knee Ice BID Compression wrap Elevate when sitting - methylPREDNISolone acetate (DEPO-MEDROL) injection 40 mg - bupivacaine (MARCAINE) 0.25 % (with pres) injection 1 mL    The above assessment and management plan was discussed with the patient. The patient verbalized understanding of and has agreed to the management plan. Patient is aware to call the clinic if symptoms persist or worsen. Patient is aware when to return to the clinic for a follow-up visit. Patient educated on when it is appropriate to go to the emergency department.   Mary-Margaret Hassell Done, FNP

## 2020-03-02 ENCOUNTER — Telehealth: Payer: Self-pay

## 2020-03-02 NOTE — Telephone Encounter (Signed)
Apt changed

## 2020-03-03 ENCOUNTER — Ambulatory Visit (INDEPENDENT_AMBULATORY_CARE_PROVIDER_SITE_OTHER): Payer: Medicare Other

## 2020-03-03 ENCOUNTER — Other Ambulatory Visit: Payer: Self-pay

## 2020-03-03 ENCOUNTER — Ambulatory Visit (INDEPENDENT_AMBULATORY_CARE_PROVIDER_SITE_OTHER): Payer: Medicare Other | Admitting: Podiatry

## 2020-03-03 DIAGNOSIS — M7661 Achilles tendinitis, right leg: Secondary | ICD-10-CM

## 2020-03-03 DIAGNOSIS — M79671 Pain in right foot: Secondary | ICD-10-CM

## 2020-03-04 ENCOUNTER — Ambulatory Visit: Payer: Medicare Other | Admitting: Nurse Practitioner

## 2020-03-04 ENCOUNTER — Encounter: Payer: Self-pay | Admitting: Podiatry

## 2020-03-04 MED ORDER — TRIAMCINOLONE ACETONIDE 10 MG/ML IJ SUSP
10.0000 mg | Freq: Once | INTRAMUSCULAR | Status: AC
  Administered 2020-03-04: 10 mg via INTRA_ARTICULAR

## 2020-03-04 NOTE — Progress Notes (Signed)
Subjective:  Patient ID: Arthur Foster, male    DOB: [birth year],  MRN: [id number]  Chief Complaint  Patient presents with  . Foot Pain    pt states he has right foot pain.     69 y.o. male presents with the above complaint.  Patient presents with complaint of right posterior heel pain that has been going on for quite some time.  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  Patient states that he had this happen before in the past but this has gotten progressively worse.  He seen some previous orthopedic doctors that he was not happy with.  He has been taking naproxen which has helped with the pain.  He would like to discuss treatment options he has not seen another podiatrist.  He denies any other acute treatment options besides putting heel lifts in the shoes.   Review of Systems: Negative except as noted in the HPI. Denies N/V/F/Ch.  Past Medical History:  Diagnosis Date  . Anxiety   . BPH (benign prostatic hyperplasia)   . Depression   . Hyperlipidemia   . Hypertension   . Testosterone deficiency     Current Outpatient Medications:  .  ALPRAZolam (XANAX) 0.25 MG tablet, TAKE 1 OR 2 TABLETS DAILY AS NEEDED, Disp: 45 tablet, Rfl: 5 .  Ascorbic Acid (VITAMIN C) 1000 MG tablet, Take 1,000 mg by mouth daily., Disp: , Rfl:  .  benzonatate (TESSALON PERLES) 100 MG capsule, Take 1 capsule (100 mg total) by mouth 3 (three) times daily as needed for cough., Disp: 20 capsule, Rfl: 11 .  cetirizine (ZYRTEC) 10 MG tablet, TAKE 1 TABLET DAILY, Disp: 90 tablet, Rfl: 1 .  diclofenac Sodium (VOLTAREN) 1 % GEL, Apply 4 g topically 4 (four) times daily., Disp: 350 g, Rfl: 4 .  escitalopram (LEXAPRO) 20 MG tablet, Take 1 tablet (20 mg total) by mouth daily., Disp: 90 tablet, Rfl: 3 .  lisinopril (ZESTRIL) 20 MG tablet, Take 1 tablet (20 mg total) by mouth daily., Disp: 90 tablet, Rfl: 3 .  naproxen (NAPROSYN) 500 MG tablet, Take 1 tablet (500 mg total) by mouth 2 (two) times  daily with a meal., Disp: 60 tablet, Rfl: 1 .  Vitamin D, Ergocalciferol, (DRISDOL) 1.25 MG (50000 UNIT) CAPS capsule, Take 1 capsule (50,000 Units total) by mouth once a week., Disp: 13 capsule, Rfl: 5  Social History   Tobacco Use  Smoking Status Former Smoker  . Types: Cigarettes  . Quit date: 65  . Years since quitting: 23.9  Smokeless Tobacco Former Systems developer  . Types: Chew  . Quit date: 2002    Allergies  Allergen Reactions  . Other   . Short Ragweed Pollen Ext   . Ambien [Zolpidem Tartrate] Other (See Comments)   Objective:  There were no vitals filed for this visit. There is no height or weight on file to calculate BMI. Constitutional Well developed. Well nourished.  Vascular Dorsalis pedis pulses palpable bilaterally. Posterior tibial pulses palpable bilaterally. Capillary refill normal to all digits.  No cyanosis or clubbing noted. Pedal hair growth normal.  Neurologic Normal speech. Oriented to person, place, and time. Epicritic sensation to light touch grossly present bilaterally.  Dermatologic Nails well groomed and normal in appearance. No open wounds. No skin lesions.  Orthopedic:  Pain on palpation to the posterior Achilles at the insertion.  Pain with dorsiflexion of the ankle joint.  No pain with plantarflexion of the ankle joint.  No pain at  the posterior tibial tendon, peroneal tendon, ATFL ligament.   Radiographs: 3 views of skeletally mature adult right foot: Severe posterior heel spurring noted with calcification within the Achilles tendon.  Plantar heel spurring noted as well.  No other bony abnormalities identified Assessment:   1. Achilles tendinitis, right leg    Plan:  Patient was evaluated and treated and all questions answered.  Right Achilles tendinitis with significant posterior heel spurring -I explained to the patient the etiology of tendinitis and various treatment options were discussed.  Given the significant amount of posterior  spurring noted as well as the amount of pain that is having I believe patient will benefit from a steroid injection to help decrease the acute inflammatory component associate with pain.  I discussed with the patient that there is a risk of Achilles tendon rupture associated with this.  He would like to proceed with injection despite the risks.  He will also place himself in the cam boot that he has at home for next few weeks.  He would like to discuss surgical option as well however he will think about all the options that I discussed with him and will plan for surgery during next clinical visit. A steroid injection was performed at right Kager's fat pad using 1% plain Lidocaine and 10 mg of Kenalog. This was well tolerated. -I will discuss surgical treatment options when I see him again in 4 weeks.    No follow-ups on file.

## 2020-04-07 ENCOUNTER — Ambulatory Visit (INDEPENDENT_AMBULATORY_CARE_PROVIDER_SITE_OTHER): Payer: Medicare Other | Admitting: Podiatry

## 2020-04-07 ENCOUNTER — Other Ambulatory Visit: Payer: Self-pay

## 2020-04-07 DIAGNOSIS — M21861 Other specified acquired deformities of right lower leg: Secondary | ICD-10-CM

## 2020-04-07 DIAGNOSIS — M216X1 Other acquired deformities of right foot: Secondary | ICD-10-CM | POA: Diagnosis not present

## 2020-04-07 DIAGNOSIS — Z01818 Encounter for other preprocedural examination: Secondary | ICD-10-CM

## 2020-04-07 DIAGNOSIS — M7661 Achilles tendinitis, right leg: Secondary | ICD-10-CM | POA: Diagnosis not present

## 2020-04-08 ENCOUNTER — Encounter: Payer: Self-pay | Admitting: Podiatry

## 2020-04-08 NOTE — Progress Notes (Signed)
Subjective:  Patient ID: Arthur Foster, male    DOB: 12-21-1950,  MRN: [id number]  Chief Complaint  Patient presents with  . Foot Pain    Right foot pain. PT stated that he is still having pain and that the injection helped for about 2 weeks.    70 y.o. male presents with the above complaint.  Patient presents with follow-up on right Achilles tendinitis with severe posterior heel spur.  Patient states he still very painful injection helped for about 2 weeks and it progressively got worse.  He states that he has tried immobilization none of which has helped.  He would like at this point discuss surgical intervention to help decrease some of his pain.  He denies any other acute complaints.  He has failed all conservative treatment options   Review of Systems: Negative except as noted in the HPI. Denies N/V/F/Ch.  Past Medical History:  Diagnosis Date  . Anxiety   . BPH (benign prostatic hyperplasia)   . Depression   . Hyperlipidemia   . Hypertension   . Testosterone deficiency     Current Outpatient Medications:  .  ALPRAZolam (XANAX) 0.25 MG tablet, TAKE 1 OR 2 TABLETS DAILY AS NEEDED, Disp: 45 tablet, Rfl: 5 .  Ascorbic Acid (VITAMIN C) 1000 MG tablet, Take 1,000 mg by mouth daily., Disp: , Rfl:  .  benzonatate (TESSALON PERLES) 100 MG capsule, Take 1 capsule (100 mg total) by mouth 3 (three) times daily as needed for cough., Disp: 20 capsule, Rfl: 11 .  cetirizine (ZYRTEC) 10 MG tablet, TAKE 1 TABLET DAILY, Disp: 90 tablet, Rfl: 1 .  diclofenac Sodium (VOLTAREN) 1 % GEL, Apply 4 g topically 4 (four) times daily., Disp: 350 g, Rfl: 4 .  escitalopram (LEXAPRO) 20 MG tablet, Take 1 tablet (20 mg total) by mouth daily., Disp: 90 tablet, Rfl: 3 .  lisinopril (ZESTRIL) 20 MG tablet, Take 1 tablet (20 mg total) by mouth daily., Disp: 90 tablet, Rfl: 3 .  naproxen (NAPROSYN) 500 MG tablet, Take 1 tablet (500 mg total) by mouth 2 (two) times daily with a meal., Disp: 60 tablet, Rfl: 1 .   Vitamin D, Ergocalciferol, (DRISDOL) 1.25 MG (50000 UNIT) CAPS capsule, Take 1 capsule (50,000 Units total) by mouth once a week., Disp: 13 capsule, Rfl: 5  Social History   Tobacco Use  Smoking Status Former Smoker  . Types: Cigarettes  . Quit date: 60  . Years since quitting: 24.0  Smokeless Tobacco Former Neurosurgeon  . Types: Chew  . Quit date: 2002    Allergies  Allergen Reactions  . Other   . Short Ragweed Pollen Ext   . Ambien [Zolpidem Tartrate] Other (See Comments)   Objective:  There were no vitals filed for this visit. There is no height or weight on file to calculate BMI. Constitutional Well developed. Well nourished.  Vascular Dorsalis pedis pulses palpable bilaterally. Posterior tibial pulses palpable bilaterally. Capillary refill normal to all digits.  No cyanosis or clubbing noted. Pedal hair growth normal.  Neurologic Normal speech. Oriented to person, place, and time. Epicritic sensation to light touch grossly present bilaterally.  Dermatologic Nails well groomed and normal in appearance. No open wounds. No skin lesions.  Orthopedic:  Pain on palpation to the posterior Achilles at the insertion.  Pain with dorsiflexion of the ankle joint.  No pain with plantarflexion of the ankle joint.  No pain at the posterior tibial tendon, peroneal tendon, ATFL ligament.  Positive Silfverskiold test with gastrocnemius  equinus   Radiographs: 3 views of skeletally mature adult right foot: Severe posterior heel spurring noted with calcification within the Achilles tendon.  Plantar heel spurring noted as well.  No other bony abnormalities identified Assessment:   1. Achilles tendinitis, right leg   2. Gastrocnemius equinus, right   3. Preoperative examination    Plan:  Patient was evaluated and treated and all questions answered.  Right Achilles tendinitis with significant posterior heel spurring with underlying ankle equinus -I explained to the patient the etiology of  tendinitis and various treatment options were discussed.  Given clinically he has not improved in terms of steroid injection and cam boot immobilization at this time patient will benefit from surgical intervention as he has failed all conservative treatment options.  I discussed my surgical plan in extensive detail with the patient.  And I discussed with the patient that the incision will be in the posterior aspect of the Achilles in the calf to help address the underlying gastrocnemius equinus as well as the Achilles tendon with debridement detachment and reattachment with Arthrex internal speed bridge.  I discussed this with the patient in extensive detail and he states understanding.  I discussed with him our postop protocol that includes a period of nonweightbearing for about 3 to 4 weeks followed by weightbearing as tolerated in cam boot he states understanding and he will be able to accomplish that. -Cam boot was dispensed -Informed surgical risk consent was reviewed and read aloud to the patient.  I reviewed the films.  I have discussed my findings with the patient in great detail.  I have discussed all risks including but not limited to infection, stiffness, scarring, limp, disability, deformity, damage to blood vessels and nerves, numbness, poor healing, need for braces, arthritis, chronic pain, amputation, death.  All benefits and realistic expectations discussed in great detail.  I have made no promises as to the outcome.  I have provided realistic expectations.  I have offered the patient a 2nd opinion, which they have declined and assured me they preferred to proceed despite the risks -A total of 34 minutes was spent in direct patient care as well as pre and post patient encounter activities.  This includes documentation as well as reviewing patient chart for labs, imaging, past medical, surgical, social, and family history as documented in the EMR.  I have reviewed medication allergies as documented  in EMR.  I discussed the etiology of condition and treatment options from conservative to surgical care.  All risks and benefit of the treatment course was discussed in detail.  All questions were answered and return appointment was discussed.  Since the visit completed in an ambulatory/outpatient setting, the patient and/or parent/guardian has been advised to contact the providers office for worsening condition and seek medical treatment and/or call 911 if the patient deems either is necessary.    No follow-ups on file.

## 2020-04-15 ENCOUNTER — Other Ambulatory Visit: Payer: Self-pay | Admitting: Podiatry

## 2020-04-15 DIAGNOSIS — M7661 Achilles tendinitis, right leg: Secondary | ICD-10-CM

## 2020-04-23 ENCOUNTER — Other Ambulatory Visit: Payer: Self-pay | Admitting: Nurse Practitioner

## 2020-04-23 DIAGNOSIS — M25562 Pain in left knee: Secondary | ICD-10-CM

## 2020-05-03 ENCOUNTER — Other Ambulatory Visit: Payer: Self-pay | Admitting: Podiatry

## 2020-05-03 ENCOUNTER — Telehealth: Payer: Self-pay | Admitting: Podiatry

## 2020-05-03 DIAGNOSIS — M2021 Hallux rigidus, right foot: Secondary | ICD-10-CM | POA: Diagnosis not present

## 2020-05-03 DIAGNOSIS — M7661 Achilles tendinitis, right leg: Secondary | ICD-10-CM | POA: Diagnosis not present

## 2020-05-03 DIAGNOSIS — M25774 Osteophyte, right foot: Secondary | ICD-10-CM | POA: Diagnosis not present

## 2020-05-03 DIAGNOSIS — M216X1 Other acquired deformities of right foot: Secondary | ICD-10-CM | POA: Diagnosis not present

## 2020-05-03 DIAGNOSIS — M62461 Contracture of muscle, right lower leg: Secondary | ICD-10-CM | POA: Diagnosis not present

## 2020-05-03 DIAGNOSIS — M25571 Pain in right ankle and joints of right foot: Secondary | ICD-10-CM | POA: Diagnosis not present

## 2020-05-03 MED ORDER — IBUPROFEN 800 MG PO TABS: 800.0000 mg | ORAL_TABLET | Freq: Four times a day (QID) | ORAL | 1 refills | Status: DC | PRN

## 2020-05-03 MED ORDER — OXYCODONE-ACETAMINOPHEN 10-325 MG PO TABS: 1.0000 | ORAL_TABLET | ORAL | 0 refills | Status: DC | PRN

## 2020-05-03 NOTE — Telephone Encounter (Signed)
Patients daughter wanted to know when should her father start taking pain medication, She also wanted to know how long should he keep wearing the boot?  Please call daughter asap [phone] Altha Harm

## 2020-05-03 NOTE — Telephone Encounter (Signed)
Done. I talked to [place] himself

## 2020-05-04 ENCOUNTER — Telehealth: Payer: Self-pay | Admitting: Podiatry

## 2020-05-04 DIAGNOSIS — I10 Essential (primary) hypertension: Secondary | ICD-10-CM | POA: Diagnosis not present

## 2020-05-04 DIAGNOSIS — F419 Anxiety disorder, unspecified: Secondary | ICD-10-CM | POA: Diagnosis not present

## 2020-05-04 DIAGNOSIS — M7731 Calcaneal spur, right foot: Secondary | ICD-10-CM | POA: Diagnosis not present

## 2020-05-04 DIAGNOSIS — M7661 Achilles tendinitis, right leg: Secondary | ICD-10-CM | POA: Diagnosis not present

## 2020-05-04 DIAGNOSIS — R262 Difficulty in walking, not elsewhere classified: Secondary | ICD-10-CM | POA: Diagnosis not present

## 2020-05-04 DIAGNOSIS — Z4789 Encounter for other orthopedic aftercare: Secondary | ICD-10-CM | POA: Diagnosis not present

## 2020-05-04 NOTE — Telephone Encounter (Signed)
Arthur Foster, [facility] has requested return call [phone]. Patient has been admitted to home health services, wound is saturated with blood. Wants to know if you would like for him to send out nurse. States case is urgent and wants to know if you would like to see patient soon.

## 2020-05-05 ENCOUNTER — Other Ambulatory Visit: Payer: Self-pay

## 2020-05-05 ENCOUNTER — Encounter: Payer: Self-pay | Admitting: Podiatry

## 2020-05-05 ENCOUNTER — Ambulatory Visit (INDEPENDENT_AMBULATORY_CARE_PROVIDER_SITE_OTHER): Payer: Medicare Other | Admitting: Podiatry

## 2020-05-05 DIAGNOSIS — M21861 Other specified acquired deformities of right lower leg: Secondary | ICD-10-CM

## 2020-05-05 DIAGNOSIS — M216X1 Other acquired deformities of right foot: Secondary | ICD-10-CM

## 2020-05-05 DIAGNOSIS — Z9889 Other specified postprocedural states: Secondary | ICD-10-CM

## 2020-05-05 DIAGNOSIS — M7661 Achilles tendinitis, right leg: Secondary | ICD-10-CM

## 2020-05-06 ENCOUNTER — Telehealth: Payer: Self-pay | Admitting: Podiatry

## 2020-05-06 NOTE — Telephone Encounter (Signed)
[facility] called in stating that they would like to move patients home visit with nurse to next week instead of this week since he came in yesterday, unless you are not ok with that per rep, Please Advise

## 2020-05-06 NOTE — Telephone Encounter (Signed)
That's fine

## 2020-05-11 ENCOUNTER — Encounter: Payer: Self-pay | Admitting: Podiatry

## 2020-05-11 NOTE — Progress Notes (Signed)
Subjective:  Patient ID: Arthur Foster, male    DOB: 1950/11/02,  MRN: [id number]  Chief Complaint  Patient presents with  . Routine Post Op    DOS 1.31.22 Achilles, gastroc recession / dressing change  -  Patient states when physical therapy started evaluation noticed large area of blood that appeared to be active bleeding      70 y.o. male returns for post-op check.  Patient states he wanted to get it evaluated because there is a large area of blood that appeared to be active bleeding noted.  However since then it has dried up.  He just wanted get it evaluated.  He denies any other acute complaints.  He has been nonweightbearing to the right lower extremity.  Review of Systems: Negative except as noted in the HPI. Denies N/V/F/Ch.  Past Medical History:  Diagnosis Date  . Anxiety   . BPH (benign prostatic hyperplasia)   . Depression   . Hyperlipidemia   . Hypertension   . Testosterone deficiency     Current Outpatient Medications:  .  ALPRAZolam (XANAX) 0.25 MG tablet, TAKE 1 OR 2 TABLETS DAILY AS NEEDED, Disp: 45 tablet, Rfl: 5 .  Ascorbic Acid (VITAMIN C) 1000 MG tablet, Take 1,000 mg by mouth daily., Disp: , Rfl:  .  benzonatate (TESSALON PERLES) 100 MG capsule, Take 1 capsule (100 mg total) by mouth 3 (three) times daily as needed for cough., Disp: 20 capsule, Rfl: 11 .  cetirizine (ZYRTEC) 10 MG tablet, TAKE 1 TABLET DAILY, Disp: 90 tablet, Rfl: 1 .  diclofenac Sodium (VOLTAREN) 1 % GEL, Apply 4 g topically 4 (four) times daily., Disp: 350 g, Rfl: 4 .  escitalopram (LEXAPRO) 20 MG tablet, Take 1 tablet (20 mg total) by mouth daily., Disp: 90 tablet, Rfl: 3 .  ibuprofen (ADVIL) 800 MG tablet, Take 1 tablet (800 mg total) by mouth every 6 (six) hours as needed., Disp: 60 tablet, Rfl: 1 .  lisinopril (ZESTRIL) 20 MG tablet, Take 1 tablet (20 mg total) by mouth daily., Disp: 90 tablet, Rfl: 3 .  naproxen (NAPROSYN) 500 MG tablet, TAKE 1 TABLET 2 TIMES A DAY WITH MEALS, Disp: 60  tablet, Rfl: 0 .  oxyCODONE-acetaminophen (PERCOCET) 10-325 MG tablet, Take 1 tablet by mouth every 4 (four) hours as needed for pain., Disp: 30 tablet, Rfl: 0 .  Vitamin D, Ergocalciferol, (DRISDOL) 1.25 MG (50000 UNIT) CAPS capsule, Take 1 capsule (50,000 Units total) by mouth once a week., Disp: 13 capsule, Rfl: 5  Social History   Tobacco Use  Smoking Status Former Smoker  . Types: Cigarettes  . Quit date: 45  . Years since quitting: 24.1  Smokeless Tobacco Former Systems developer  . Types: Chew  . Quit date: 2002    Allergies  Allergen Reactions  . Other   . Short Ragweed Pollen Ext   . Ambien [Zolpidem Tartrate] Other (See Comments)   Objective:  There were no vitals filed for this visit. There is no height or weight on file to calculate BMI. Constitutional Well developed. Well nourished.  Vascular Foot warm and well perfused. Capillary refill normal to all digits.   Neurologic Normal speech. Oriented to person, place, and time. Epicritic sensation to light touch grossly present bilaterally.  Dermatologic Skin healing well without signs of infection. Skin edges well coapted without signs of infection.  Orthopedic: Tenderness to palpation noted about the surgical site.   Radiographs: None Assessment:   1. Achilles tendinitis, right leg   2. Gastrocnemius  equinus, right   3. Status post right foot surgery    Plan:  Patient was evaluated and treated and all questions answered.  S/p foot surgery right -Progressing as expected post-operatively. -XR: None -WB Status: Nonweightbearing to the right lower extremity in knee scooter -Sutures: Intact.  No signs of dehiscence noted.  No complication noted. -Medications: None -Foot redressed.  No follow-ups on file.

## 2020-05-12 ENCOUNTER — Encounter: Payer: Medicare Other | Admitting: Podiatry

## 2020-05-21 ENCOUNTER — Encounter: Payer: Self-pay | Admitting: Podiatry

## 2020-05-21 ENCOUNTER — Other Ambulatory Visit: Payer: Self-pay

## 2020-05-21 ENCOUNTER — Ambulatory Visit (INDEPENDENT_AMBULATORY_CARE_PROVIDER_SITE_OTHER): Payer: Medicare Other | Admitting: Nurse Practitioner

## 2020-05-21 ENCOUNTER — Encounter: Payer: Self-pay | Admitting: Nurse Practitioner

## 2020-05-21 ENCOUNTER — Ambulatory Visit (INDEPENDENT_AMBULATORY_CARE_PROVIDER_SITE_OTHER): Payer: Medicare Other | Admitting: Podiatry

## 2020-05-21 VITALS — BP 137/68 | HR 84 | Temp 97.5°F | Ht 73.0 in

## 2020-05-21 DIAGNOSIS — M21861 Other specified acquired deformities of right lower leg: Secondary | ICD-10-CM

## 2020-05-21 DIAGNOSIS — I10 Essential (primary) hypertension: Secondary | ICD-10-CM | POA: Diagnosis not present

## 2020-05-21 DIAGNOSIS — E782 Mixed hyperlipidemia: Secondary | ICD-10-CM | POA: Diagnosis not present

## 2020-05-21 DIAGNOSIS — T8131XA Disruption of external operation (surgical) wound, not elsewhere classified, initial encounter: Secondary | ICD-10-CM

## 2020-05-21 DIAGNOSIS — M216X1 Other acquired deformities of right foot: Secondary | ICD-10-CM

## 2020-05-21 DIAGNOSIS — F339 Major depressive disorder, recurrent, unspecified: Secondary | ICD-10-CM | POA: Diagnosis not present

## 2020-05-21 DIAGNOSIS — M7661 Achilles tendinitis, right leg: Secondary | ICD-10-CM

## 2020-05-21 DIAGNOSIS — F411 Generalized anxiety disorder: Secondary | ICD-10-CM | POA: Diagnosis not present

## 2020-05-21 NOTE — Assessment & Plan Note (Addendum)
Symptoms well controlled.  Continue low-cholesterol diet and exercise as tolerated.

## 2020-05-21 NOTE — Assessment & Plan Note (Signed)
Blood pressure well controlled on current medication.  Advised patient to continue low-sodium diet and exercise regimen as tolerated.  Education printed handouts provided to patient.

## 2020-05-21 NOTE — Progress Notes (Signed)
Established Patient Office Visit  Subjective:  Patient ID: Arthur Foster, male    DOB: [birth year]  Age: 70 y.o. MRN: [id number]  CC:  Chief Complaint  Patient presents with  . Medical Management of Chronic Issues    HPI Arthur Foster presents for follow up of hypertension. Patient was diagnosed in 07/03/2012. The patient is tolerating the medication well without side effects. Compliance with treatment has been good; including taking medication as directed , maintains a healthy diet and regular exercise regimen , and following up as directed.  Current medication lisinopril 20 mg tablet daily.   Mixed hyperlipidemia  Pt presents with hyperlipidemia. Patient was diagnosed in 08/08/2017. Compliance with treatment has been good The patient is compliant with low-cholesterol diet and exercise, follows up as directed , and maintains an exercise regimen . The patient denies experiencing any hypercholesterolemia related symptoms.    Anxiety: Patient complains of anxiety disorder.  He has the following symptoms: difficulty concentrating, irritable. Onset of symptoms was approximately 6 years ago, stable since that time. He denies current suicidal and homicidal ideation. Family history significant for no psychiatric illness.Possible organic causes contributing are: none. Risk factors: previous episode of depression Previous treatment includes Lexapro  He complains of the following side effects from the treatment: none.   Past Medical History:  Diagnosis Date  . Anxiety   . BPH (benign prostatic hyperplasia)   . Depression   . Hyperlipidemia   . Hypertension   . Testosterone deficiency     Past Surgical History:  Procedure Laterality Date  . FOOT SURGERY    . HERNIA REPAIR Left inguinal    Family History  Problem Relation Age of Onset  . Hypertension Mother   . Alzheimer's disease Mother   . Hypertension Father   . Alzheimer's disease Father     Social History   Socioeconomic History  .  Marital status: Married    Spouse name: Arthur Foster  . Number of children: 3  . Years of education: 17  . Highest education level: High school graduate  Occupational History  . Occupation: retired    Comment: works a few hours a week  Tobacco Use  . Smoking status: Former Smoker    Types: Cigarettes    Quit date: 1998    Years since quitting: 24.1  . Smokeless tobacco: Former Systems developer    Types: [place] date: 2002  Media planner  . Vaping Use: Never used  Substance and Sexual Activity  . Alcohol use: Yes    Alcohol/week: 14.0 standard drinks    Types: 14 Cans of beer per week  . Drug use: No  . Sexual activity: Yes    Birth control/protection: None  Other Topics Concern  . Not on file  Social History Narrative  . Not on file   Social Determinants of Health   Financial Resource Strain: Not on file  Food Insecurity: Not on file  Transportation Needs: Not on file  Physical Activity: Not on file  Stress: Not on file  Social Connections: Not on file  Intimate Partner Violence: Not on file    Outpatient Medications Prior to Visit  Medication Sig Dispense Refill  . ALPRAZolam (XANAX) 0.25 MG tablet TAKE 1 OR 2 TABLETS DAILY AS NEEDED 45 tablet 5  . Ascorbic Acid (VITAMIN C) 1000 MG tablet Take 1,000 mg by mouth daily.    . cetirizine (ZYRTEC) 10 MG tablet TAKE 1 TABLET DAILY 90 tablet 1  . escitalopram (LEXAPRO) 20  MG tablet Take 1 tablet (20 mg total) by mouth daily. 90 tablet 3  . ibuprofen (ADVIL) 800 MG tablet Take 1 tablet (800 mg total) by mouth every 6 (six) hours as needed. 60 tablet 1  . lisinopril (ZESTRIL) 20 MG tablet Take 1 tablet (20 mg total) by mouth daily. 90 tablet 3  . Vitamin D, Ergocalciferol, (DRISDOL) 1.25 MG (50000 UNIT) CAPS capsule Take 1 capsule (50,000 Units total) by mouth once a week. 13 capsule 5  . benzonatate (TESSALON PERLES) 100 MG capsule Take 1 capsule (100 mg total) by mouth 3 (three) times daily as needed for cough. 20 capsule 11  .  diclofenac Sodium (VOLTAREN) 1 % GEL Apply 4 g topically 4 (four) times daily. 350 g 4  . naproxen (NAPROSYN) 500 MG tablet TAKE 1 TABLET 2 TIMES A DAY WITH MEALS 60 tablet 0  . oxyCODONE-acetaminophen (PERCOCET) 10-325 MG tablet Take 1 tablet by mouth every 4 (four) hours as needed for pain. 30 tablet 0   No facility-administered medications prior to visit.    Allergies  Allergen Reactions  . Other   . Short Ragweed Pollen Ext   . Ambien [Zolpidem Tartrate] Other (See Comments)    ROS Review of Systems  HENT: Negative.   Eyes: Negative.   Respiratory: Negative.   Gastrointestinal: Negative.   Genitourinary: Negative.   Musculoskeletal: Positive for joint swelling.       Recent right leg surgery  Skin: Negative.   Neurological: Negative.   Psychiatric/Behavioral: Negative for self-injury, sleep disturbance and suicidal ideas. The patient is not nervous/anxious.   All other systems reviewed and are negative.     Objective:    Physical Exam Vitals reviewed. Exam conducted with a chaperone present (spouse).  Constitutional:      Appearance: Normal appearance.  HENT:     Head: Normocephalic.     Nose: Nose normal.  Eyes:     Conjunctiva/sclera: Conjunctivae normal.  Cardiovascular:     Rate and Rhythm: Normal rate.     Pulses: Normal pulses.     Heart sounds: Normal heart sounds.  Pulmonary:     Effort: Pulmonary effort is normal.     Breath sounds: Normal breath sounds.  Abdominal:     General: Bowel sounds are normal.  Musculoskeletal:        General: Swelling and tenderness present.     Cervical back: Normal range of motion.  Neurological:     Mental Status: He is alert and oriented to person, place, and time.  Psychiatric:        Behavior: Behavior normal.     BP 137/68   Pulse 84   Temp (!) 97.5 F (36.4 C)   Ht 6\' 1"  (1.854 m)   SpO2 95%   BMI 41.82 kg/m  Wt Readings from Last 3 Encounters:  03/01/20 (!) 317 lb (143.8 kg)  11/24/19 (!) 312 lb  (141.5 kg)  05/26/19 (!) 320 lb (145.2 kg)     There are no preventive care reminders to display for this patient.  There are no preventive care reminders to display for this patient.  Lab Results  Component Value Date   TSH 1.970 05/26/2019   Lab Results  Component Value Date   WBC 6.9 11/24/2019   HGB 15.2 11/24/2019   HCT 46.6 11/24/2019   MCV 92 11/24/2019   PLT 340 11/24/2019   Lab Results  Component Value Date   NA 140 11/24/2019   K 5.2 11/24/2019  CO2 26 11/24/2019   GLUCOSE 105 (H) 11/24/2019   BUN 16 11/24/2019   CREATININE 0.95 11/24/2019   BILITOT 1.1 11/24/2019   ALKPHOS 72 11/24/2019   AST 21 11/24/2019   ALT 27 11/24/2019   PROT 7.2 11/24/2019   ALBUMIN 4.4 11/24/2019   CALCIUM 9.6 11/24/2019   Lab Results  Component Value Date   CHOL 209 (H) 11/24/2019   Lab Results  Component Value Date   HDL 51 11/24/2019   Lab Results  Component Value Date   LDLCALC 132 (H) 11/24/2019   Lab Results  Component Value Date   TRIG 144 11/24/2019   Lab Results  Component Value Date   CHOLHDL 4.1 11/24/2019   Lab Results  Component Value Date   HGBA1C 5.7 11/22/2018      Assessment & Plan:   Problem List Items Addressed This Visit      Cardiovascular and Mediastinum   Essential hypertension, benign - Primary    Blood pressure well controlled on current medication.  Advised patient to continue low-sodium diet and exercise regimen as tolerated.  Education printed handouts provided to patient.      Relevant Orders   Testosterone   CBC with Differential   Comprehensive metabolic panel     Other   Depression, recurrent (HCC)   Generalized anxiety disorder    Patient generalized anxiety disorder well-controlled on Lexapro 20 mg tablet by mouth daily.  Education provided to patient printed handouts given. Follow-up with worsening or unresolved symptoms.  Completed GAD-7, PHQ-9.      Mixed dyslipidemia    Symptoms well controlled.  Continue  low-cholesterol diet and exercise as tolerated.      Relevant Orders   Lipid Panel     Flowsheet Row Office Visit from 05/21/2020 in [facility]  PHQ-9 Total Score 0     GAD 7 : Generalized Anxiety Score 05/21/2020 11/22/2018  Nervous, Anxious, on Edge 0 0  Control/stop worrying 0 0  Worry too much - different things 0 1  Trouble relaxing 0 1  Restless 0 0  Easily annoyed or irritable 0 0  Afraid - awful might happen 0 0  Total GAD 7 Score 0 2    No orders of the defined types were placed in this encounter.   Follow-up: Return in about 3 months (around 08/18/2020).    Ivy Lynn, NP

## 2020-05-21 NOTE — Assessment & Plan Note (Signed)
Patient generalized anxiety disorder well-controlled on Lexapro 20 mg tablet by mouth daily.  Education provided to patient printed handouts given. Follow-up with worsening or unresolved symptoms.  Completed GAD-7, PHQ-9.

## 2020-05-21 NOTE — Patient Instructions (Signed)
[email or web address]">  Generalized Anxiety Disorder, Adult Generalized anxiety disorder (GAD) is a mental health condition. Unlike normal worries, anxiety related to GAD is not triggered by a specific event. These worries do not fade or get better with time. GAD interferes with relationships, work, and school. GAD symptoms can vary from mild to severe. People with severe GAD can have intense waves of anxiety with physical symptoms that are similar to panic attacks. What are the causes? The exact cause of GAD is not known, but the following are believed to have an impact:  Differences in natural brain chemicals.  Genes passed down from parents to children.  Differences in the way threats are perceived.  Development during childhood.  Personality. What increases the risk? The following factors may make you more likely to develop this condition:  Being male.  Having a family history of anxiety disorders.  Being very shy.  Experiencing very stressful life events, such as the death of a loved one.  Having a very stressful family environment. What are the signs or symptoms? People with GAD often worry excessively about many things in their lives, such as their health and family. Symptoms may also include:  Mental and emotional symptoms: ? Worrying excessively about natural disasters. ? Fear of being late. ? Difficulty concentrating. ? Fears that others are judging your performance.  Physical symptoms: ? Fatigue. ? Headaches, muscle tension, muscle twitches, trembling, or feeling shaky. ? Feeling like your heart is pounding or beating very fast. ? Feeling out of breath or like you cannot take a deep breath. ? Having trouble falling asleep or staying asleep, or experiencing restlessness. ? Sweating. ? Nausea, diarrhea, or irritable bowel syndrome (IBS).  Behavioral symptoms: ? Experiencing erratic moods or irritability. ? Avoidance of new situations. ? Avoidance of  people. ? Extreme difficulty making decisions. How is this diagnosed? This condition is diagnosed based on your symptoms and medical history. You will also have a physical exam. Your health care provider may perform tests to rule out other possible causes of your symptoms. To be diagnosed with GAD, a person must have anxiety that:  Is out of his or her control.  Affects several different aspects of his or her life, such as work and relationships.  Causes distress that makes him or her unable to take part in normal activities.  Includes at least three symptoms of GAD, such as restlessness, fatigue, trouble concentrating, irritability, muscle tension, or sleep problems. Before your health care provider can confirm a diagnosis of GAD, these symptoms must be present more days than they are not, and they must last for 6 months or longer. How is this treated? This condition may be treated with:  Medicine. Antidepressant medicine is usually prescribed for long-term daily control. Anti-anxiety medicines may be added in severe cases, especially when panic attacks occur.  Talk therapy (psychotherapy). Certain types of talk therapy can be helpful in treating GAD by providing support, education, and guidance. Options include: ? Cognitive behavioral therapy (CBT). People learn coping skills and self-calming techniques to ease their physical symptoms. They learn to identify unrealistic thoughts and behaviors and to replace them with more appropriate thoughts and behaviors. ? Acceptance and commitment therapy (ACT). This treatment teaches people how to be mindful as a way to cope with unwanted thoughts and feelings. ? Biofeedback. This process trains you to manage your body's response (physiological response) through breathing techniques and relaxation methods. You will work with a therapist while machines are used to monitor your physical   symptoms.  Stress management techniques. These include yoga,  meditation, and exercise. A mental health specialist can help determine which treatment is best for you. Some people see improvement with one type of therapy. However, other people require a combination of therapies.   Follow these instructions at home: Lifestyle  Maintain a consistent routine and schedule.  Anticipate stressful situations. Create a plan, and allow extra time to work with your plan.  Practice stress management or self-calming techniques that you have learned from your therapist or your health care provider. General instructions  Take over-the-counter and prescription medicines only as told by your health care provider.  Understand that you are likely to have setbacks. Accept this and be kind to yourself as you persist to take better care of yourself.  Recognize and accept your accomplishments, even if you judge them as small.  Keep all follow-up visits as told by your health care provider. This is important. Contact a health care provider if:  Your symptoms do not get better.  Your symptoms get worse.  You have signs of depression, such as: ? A persistently sad or irritable mood. ? Loss of enjoyment in activities that used to bring you joy. ? Change in weight or eating. ? Changes in sleeping habits. ? Avoiding friends or family members. ? Loss of energy for normal tasks. ? Feelings of guilt or worthlessness. Get help right away if:  You have serious thoughts about hurting yourself or others. If you ever feel like you may hurt yourself or others, or have thoughts about taking your own life, get help right away. Go to your nearest emergency department or:  Call your local emergency services (911 in the [place]  Call a suicide crisis helpline, such as the National Suicide Prevention Lifeline at 1-800-273-8255. This is open 24 hours a day in the [place]  Text the Crisis Text Line at 741741 (in the [place] Summary  Generalized anxiety disorder (GAD) is a mental  health condition that involves worry that is not triggered by a specific event.  People with GAD often worry excessively about many things in their lives, such as their health and family.  GAD may cause symptoms such as restlessness, trouble concentrating, sleep problems, frequent sweating, nausea, diarrhea, headaches, and trembling or muscle twitching.  A mental health specialist can help determine which treatment is best for you. Some people see improvement with one type of therapy. However, other people require a combination of therapies. This information is not intended to replace advice given to you by your health care provider. Make sure you discuss any questions you have with your health care provider. Document Revised: 01/08/2019 Document Reviewed: 01/08/2019 Elsevier Patient Education  2021 Else[employer] Inc. Hypertension, Adult Hypertension is another name for high blood pressure. High blood pressure forces your heart to work harder to pump blood. This can cause problems over time. There are two numbers in a blood pressure reading. There is a top number (systolic) over a bottom number (diastolic). It is best to have a blood pressure that is below 120/80. Healthy choices can help lower your blood pressure, or you may need medicine to help lower it. What are the causes? The cause of this condition is not known. Some conditions may be related to high blood pressure. What increases the risk?  Smoking.  Having type 2 diabetes mellitus, high cholesterol, or both.  Not getting enough exercise or physical activity.  Being overweight.  Having too much fat, sugar, calories, or salt (sodium) in   your diet.  Drinking too much alcohol.  Having long-term (chronic) kidney disease.  Having a family history of high blood pressure.  Age. Risk increases with age.  Race. You may be at higher risk if you are African American.  Gender. Men are at higher risk than women before age 45. After age  65, women are at higher risk than men.  Having obstructive sleep apnea.  Stress. What are the signs or symptoms?  High blood pressure may not cause symptoms. Very high blood pressure (hypertensive crisis) may cause: ? Headache. ? Feelings of worry or nervousness (anxiety). ? Shortness of breath. ? Nosebleed. ? A feeling of being sick to your stomach (nausea). ? Throwing up (vomiting). ? Changes in how you see. ? Very bad chest pain. ? Seizures. How is this treated?  This condition is treated by making healthy lifestyle changes, such as: ? Eating healthy foods. ? Exercising more. ? Drinking less alcohol.  Your health care provider may prescribe medicine if lifestyle changes are not enough to get your blood pressure under control, and if: ? Your top number is above 130. ? Your bottom number is above 80.  Your personal target blood pressure may vary. Follow these instructions at home: Eating and drinking  If told, follow the DASH eating plan. To follow this plan: ? Fill one half of your plate at each meal with fruits and vegetables. ? Fill one fourth of your plate at each meal with whole grains. Whole grains include whole-wheat pasta, brown rice, and whole-grain bread. ? Eat or drink low-fat dairy products, such as skim milk or low-fat yogurt. ? Fill one fourth of your plate at each meal with low-fat (lean) proteins. Low-fat proteins include fish, chicken without skin, eggs, beans, and tofu. ? Avoid fatty meat, cured and processed meat, or chicken with skin. ? Avoid pre-made or processed food.  Eat less than 1,500 mg of salt each day.  Do not drink alcohol if: ? Your doctor tells you not to drink. ? You are pregnant, may be pregnant, or are planning to become pregnant.  If you drink alcohol: ? Limit how much you use to:  0-1 drink a day for women.  0-2 drinks a day for men. ? Be aware of how much alcohol is in your drink. In the [place] one drink equals one 12 oz bottle  of beer (355 mL), one 5 oz glass of wine (148 mL), or one 1 oz glass of hard liquor (44 mL).   Lifestyle  Work with your doctor to stay at a healthy weight or to lose weight. Ask your doctor what the best weight is for you.  Get at least 30 minutes of exercise most days of the week. This may include walking, swimming, or biking.  Get at least 30 minutes of exercise that strengthens your muscles (resistance exercise) at least 3 days a week. This may include lifting weights or doing Pilates.  Do not use any products that contain nicotine or tobacco, such as cigarettes, e-cigarettes, and chewing tobacco. If you need help quitting, ask your doctor.  Check your blood pressure at home as told by your doctor.  Keep all follow-up visits as told by your doctor. This is important.   Medicines  Take over-the-counter and prescription medicines only as told by your doctor. Follow directions carefully.  Do not skip doses of blood pressure medicine. The medicine does not work as well if you skip doses. Skipping doses also puts you at risk   for problems.  Ask your doctor about side effects or reactions to medicines that you should watch for. Contact a doctor if you:  Think you are having a reaction to the medicine you are taking.  Have headaches that keep coming back (recurring).  Feel dizzy.  Have swelling in your ankles.  Have trouble with your vision. Get help right away if you:  Get a very bad headache.  Start to feel mixed up (confused).  Feel weak or numb.  Feel faint.  Have very bad pain in your: ? Chest. ? Belly (abdomen).  Throw up more than once.  Have trouble breathing. Summary  Hypertension is another name for high blood pressure.  High blood pressure forces your heart to work harder to pump blood.  For most people, a normal blood pressure is less than 120/80.  Making healthy choices can help lower blood pressure. If your blood pressure does not get lower with  healthy choices, you may need to take medicine. This information is not intended to replace advice given to you by your health care provider. Make sure you discuss any questions you have with your health care provider. Document Revised: 11/28/2017 Document Reviewed: 11/28/2017 Elsevier Patient Education  2021 Elsevier Inc.  

## 2020-05-21 NOTE — Progress Notes (Signed)
Subjective:  Patient ID: Arthur Foster, male    DOB: 06-May-1950,  MRN: [id number]  Chief Complaint  Patient presents with  . Routine Post Op    Post op Achilles tendon       70 y.o. male returns for post-op check.  Date of surgery 05/03/2020.  Right Achilles detachment/reattachment with repair with resection of Haglund's deformity with gastrocnemius recession.  Patient states he does not have any pain.  He is doing well overall.  He has been nonweightbearing to the right lower extremity with a knee scooter.  Review of Systems: Negative except as noted in the HPI. Denies N/V/F/Ch.  Past Medical History:  Diagnosis Date  . Anxiety   . BPH (benign prostatic hyperplasia)   . Depression   . Hyperlipidemia   . Hypertension   . Testosterone deficiency     Current Outpatient Medications:  .  ALPRAZolam (XANAX) 0.25 MG tablet, TAKE 1 OR 2 TABLETS DAILY AS NEEDED, Disp: 45 tablet, Rfl: 5 .  Ascorbic Acid (VITAMIN C) 1000 MG tablet, Take 1,000 mg by mouth daily., Disp: , Rfl:  .  benzonatate (TESSALON PERLES) 100 MG capsule, Take 1 capsule (100 mg total) by mouth 3 (three) times daily as needed for cough., Disp: 20 capsule, Rfl: 11 .  cetirizine (ZYRTEC) 10 MG tablet, TAKE 1 TABLET DAILY, Disp: 90 tablet, Rfl: 1 .  diclofenac Sodium (VOLTAREN) 1 % GEL, Apply 4 g topically 4 (four) times daily., Disp: 350 g, Rfl: 4 .  escitalopram (LEXAPRO) 20 MG tablet, Take 1 tablet (20 mg total) by mouth daily., Disp: 90 tablet, Rfl: 3 .  ibuprofen (ADVIL) 800 MG tablet, Take 1 tablet (800 mg total) by mouth every 6 (six) hours as needed., Disp: 60 tablet, Rfl: 1 .  lisinopril (ZESTRIL) 20 MG tablet, Take 1 tablet (20 mg total) by mouth daily., Disp: 90 tablet, Rfl: 3 .  naproxen (NAPROSYN) 500 MG tablet, TAKE 1 TABLET 2 TIMES A DAY WITH MEALS, Disp: 60 tablet, Rfl: 0 .  oxyCODONE-acetaminophen (PERCOCET) 10-325 MG tablet, Take 1 tablet by mouth every 4 (four) hours as needed for pain., Disp: 30 tablet, Rfl:  0 .  Vitamin D, Ergocalciferol, (DRISDOL) 1.25 MG (50000 UNIT) CAPS capsule, Take 1 capsule (50,000 Units total) by mouth once a week., Disp: 13 capsule, Rfl: 5  Social History   Tobacco Use  Smoking Status Former Smoker  . Types: Cigarettes  . Quit date: 71  . Years since quitting: 24.1  Smokeless Tobacco Former Systems developer  . Types: Chew  . Quit date: 2002    Allergies  Allergen Reactions  . Other   . Short Ragweed Pollen Ext   . Ambien [Zolpidem Tartrate] Other (See Comments)   Objective:  There were no vitals filed for this visit. There is no height or weight on file to calculate BMI. Constitutional Well developed. Well nourished.  Vascular Foot warm and well perfused. Capillary refill normal to all digits.   Neurologic Normal speech. Oriented to person, place, and time. Epicritic sensation to light touch grossly present bilaterally.  Dermatologic Skin healing well without signs of infection. Skin edges well coapted without signs of infection except for distal incision there is superficial dehiscence without any clinical signs of infection.  Orthopedic: Tenderness to palpation noted about the surgical site.   Radiographs: None Assessment:   1. Superficial disruption or dehiscence of operation wound, initial encounter   2. Achilles tendinitis, right leg   3. Gastrocnemius equinus, right    Plan:  Patient was evaluated and treated and all questions answered.  S/p foot surgery right -Progressing as expected post-operatively. -XR: None -WB Status: Nonweightbearing to the right lower extremity in knee scooter -Sutures/staples: Intact.  No signs of dehiscence noted.  No complication noted. -Medications: None -Superficial dehiscence noted measured to be 8.2 cm x 0.3 cm x 0.2 cm.  Fibrogranular wound bed.  I encouraged Betadine wet-to-dry dressing changes to the distal incision site.  He would do it every other day.  No follow-ups on file.

## 2020-05-22 ENCOUNTER — Other Ambulatory Visit: Payer: Self-pay | Admitting: Family Medicine

## 2020-05-22 DIAGNOSIS — F339 Major depressive disorder, recurrent, unspecified: Secondary | ICD-10-CM

## 2020-05-22 DIAGNOSIS — F411 Generalized anxiety disorder: Secondary | ICD-10-CM

## 2020-05-24 ENCOUNTER — Ambulatory Visit: Payer: Medicare Other | Admitting: Nurse Practitioner

## 2020-05-25 ENCOUNTER — Other Ambulatory Visit: Payer: Self-pay

## 2020-05-25 ENCOUNTER — Other Ambulatory Visit: Payer: Medicare Other

## 2020-05-25 DIAGNOSIS — I10 Essential (primary) hypertension: Secondary | ICD-10-CM | POA: Diagnosis not present

## 2020-05-25 DIAGNOSIS — E782 Mixed hyperlipidemia: Secondary | ICD-10-CM | POA: Diagnosis not present

## 2020-05-25 DIAGNOSIS — R7309 Other abnormal glucose: Secondary | ICD-10-CM | POA: Diagnosis not present

## 2020-05-26 ENCOUNTER — Other Ambulatory Visit: Payer: Self-pay | Admitting: Nurse Practitioner

## 2020-05-26 ENCOUNTER — Ambulatory Visit: Payer: Medicare Other | Admitting: Family Medicine

## 2020-05-26 DIAGNOSIS — R7309 Other abnormal glucose: Secondary | ICD-10-CM

## 2020-05-26 LAB — CBC WITH DIFFERENTIAL/PLATELET
Basophils Absolute: 0 10*3/uL (ref 0.0–0.2)
Basos: 1 %
EOS (ABSOLUTE): 0.3 10*3/uL (ref 0.0–0.4)
Eos: 5 %
Hematocrit: 47 % (ref 37.5–51.0)
Hemoglobin: 15.6 g/dL (ref 13.0–17.7)
Immature Grans (Abs): 0.1 10*3/uL (ref 0.0–0.1)
Immature Granulocytes: 1 %
Lymphocytes Absolute: 1.7 10*3/uL (ref 0.7–3.1)
Lymphs: 28 %
MCH: 29.9 pg (ref 26.6–33.0)
MCHC: 33.2 g/dL (ref 31.5–35.7)
MCV: 90 fL (ref 79–97)
Monocytes Absolute: 0.5 10*3/uL (ref 0.1–0.9)
Monocytes: 9 %
Neutrophils Absolute: 3.5 10*3/uL (ref 1.4–7.0)
Neutrophils: 56 %
Platelets: 377 10*3/uL (ref 150–450)
RBC: 5.21 x10E6/uL (ref 4.14–5.80)
RDW: 12.1 % (ref 11.6–15.4)
WBC: 6.1 10*3/uL (ref 3.4–10.8)

## 2020-05-26 LAB — COMPREHENSIVE METABOLIC PANEL
ALT: 33 IU/L (ref 0–44)
AST: 26 IU/L (ref 0–40)
Albumin/Globulin Ratio: 1.7 (ref 1.2–2.2)
Albumin: 4.5 g/dL (ref 3.8–4.8)
Alkaline Phosphatase: 70 IU/L (ref 44–121)
BUN/Creatinine Ratio: 18 (ref 10–24)
BUN: 20 mg/dL (ref 8–27)
Bilirubin Total: 0.9 mg/dL (ref 0.0–1.2)
CO2: 23 mmol/L (ref 20–29)
Calcium: 9.6 mg/dL (ref 8.6–10.2)
Chloride: 103 mmol/L (ref 96–106)
Creatinine, Ser: 1.12 mg/dL (ref 0.76–1.27)
GFR calc Af Amer: 78 mL/min/{1.73_m2} (ref >59)
GFR calc non Af Amer: 68 mL/min/{1.73_m2} (ref >59)
Globulin, Total: 2.6 g/dL (ref 1.5–4.5)
Glucose: 113 mg/dL — ABNORMAL HIGH (ref 65–99)
Potassium: 5.2 mmol/L (ref 3.5–5.2)
Sodium: 143 mmol/L (ref 134–144)
Total Protein: 7.1 g/dL (ref 6.0–8.5)

## 2020-05-26 LAB — LIPID PANEL
Chol/HDL Ratio: 4.6 ratio (ref 0.0–5.0)
Cholesterol, Total: 217 mg/dL — ABNORMAL HIGH (ref 100–199)
HDL: 47 mg/dL (ref >39)
LDL Chol Calc (NIH): 140 mg/dL — ABNORMAL HIGH (ref 0–99)
Triglycerides: 168 mg/dL — ABNORMAL HIGH (ref 0–149)
VLDL Cholesterol Cal: 30 mg/dL (ref 5–40)

## 2020-05-26 LAB — TESTOSTERONE: Testosterone: 164 ng/dL — ABNORMAL LOW (ref 264–916)

## 2020-05-27 ENCOUNTER — Other Ambulatory Visit: Payer: Self-pay | Admitting: Nurse Practitioner

## 2020-05-27 DIAGNOSIS — R7989 Other specified abnormal findings of blood chemistry: Secondary | ICD-10-CM

## 2020-05-27 LAB — SPECIMEN STATUS REPORT

## 2020-05-27 LAB — HGB A1C W/O EAG: Hgb A1c MFr Bld: 5.8 % — ABNORMAL HIGH (ref 4.8–5.6)

## 2020-05-27 MED ORDER — TESTOSTERONE 20.25 MG/1.25GM (1.62%) TD GEL: 2.0000 | Freq: Every day | TRANSDERMAL | 2 refills | Status: DC

## 2020-05-28 ENCOUNTER — Other Ambulatory Visit: Payer: Self-pay | Admitting: Family Medicine

## 2020-05-28 DIAGNOSIS — R7989 Other specified abnormal findings of blood chemistry: Secondary | ICD-10-CM

## 2020-05-31 ENCOUNTER — Telehealth: Payer: Self-pay | Admitting: *Deleted

## 2020-05-31 NOTE — Telephone Encounter (Signed)
PA in process Key: [place] - PA Case ID: [id number] Need help? Call us at [phone] Status Sent to [facility] Testosterone 20.25 MG/1.25GM(1.62%) gel

## 2020-06-02 ENCOUNTER — Encounter: Payer: Medicare Other | Admitting: Podiatry

## 2020-06-03 NOTE — Telephone Encounter (Signed)
Approved on March 2 PA Case: [id number], Status: Approved, Coverage Starts on: 04/03/2020 12:00:00 AM, Coverage Ends on: 04/02/2021 12:00:00 AM. Questions? Contact [phone]

## 2020-06-03 NOTE — Telephone Encounter (Signed)
[facility] aware

## 2020-06-04 ENCOUNTER — Other Ambulatory Visit: Payer: Self-pay

## 2020-06-04 ENCOUNTER — Ambulatory Visit (INDEPENDENT_AMBULATORY_CARE_PROVIDER_SITE_OTHER): Payer: Medicare Other | Admitting: Podiatry

## 2020-06-04 ENCOUNTER — Encounter: Payer: Medicare Other | Admitting: Podiatry

## 2020-06-04 DIAGNOSIS — M216X1 Other acquired deformities of right foot: Secondary | ICD-10-CM

## 2020-06-04 DIAGNOSIS — M21861 Other specified acquired deformities of right lower leg: Secondary | ICD-10-CM

## 2020-06-04 DIAGNOSIS — T8131XA Disruption of external operation (surgical) wound, not elsewhere classified, initial encounter: Secondary | ICD-10-CM

## 2020-06-04 DIAGNOSIS — M7661 Achilles tendinitis, right leg: Secondary | ICD-10-CM

## 2020-06-08 ENCOUNTER — Encounter: Payer: Self-pay | Admitting: Podiatry

## 2020-06-08 NOTE — Progress Notes (Signed)
6   Subjective:  Patient ID: Arthur Foster, male    DOB: [birth year],  MRN: [id number]  Chief Complaint  Patient presents with  . Routine Post Op    POST OP. Achilles tendon       70 y.o. male returns for post-op check.  Date of surgery 05/03/2020.  Right Achilles detachment/reattachment with repair with resection of Haglund's deformity with gastrocnemius recession.  Patient states he does not have any pain.  He is doing well overall.  He has been nonweightbearing to the right lower extremity with a knee scooter.  Review of Systems: Negative except as noted in the HPI. Denies N/V/F/Ch.  Past Medical History:  Diagnosis Date  . Anxiety   . BPH (benign prostatic hyperplasia)   . Depression   . Hyperlipidemia   . Hypertension   . Testosterone deficiency     Current Outpatient Medications:  .  ALPRAZolam (XANAX) 0.25 MG tablet, TAKE 1 OR 2 TABLETS DAILY AS NEEDED, Disp: 45 tablet, Rfl: 0 .  naproxen (NAPROSYN) 500 MG tablet, TAKE 1 TABLET 2 TIMES A DAY WITH MEALS, Disp: 60 tablet, Rfl: 0 .  Ascorbic Acid (VITAMIN C) 1000 MG tablet, Take 1,000 mg by mouth daily., Disp: , Rfl:  .  cetirizine (ZYRTEC) 10 MG tablet, TAKE 1 TABLET DAILY, Disp: 90 tablet, Rfl: 1 .  escitalopram (LEXAPRO) 20 MG tablet, Take 1 tablet (20 mg total) by mouth daily., Disp: 90 tablet, Rfl: 3 .  lisinopril (ZESTRIL) 20 MG tablet, Take 1 tablet (20 mg total) by mouth daily., Disp: 90 tablet, Rfl: 3 .  Testosterone (ANDROGEL) 20.25 MG/1.25GM (1.62%) GEL, Place 2 Pump onto the skin daily., Disp: 1.25 g, Rfl: 2 .  Vitamin D, Ergocalciferol, (DRISDOL) 1.25 MG (50000 UNIT) CAPS capsule, Take 1 capsule (50,000 Units total) by mouth once a week., Disp: 13 capsule, Rfl: 5  Social History   Tobacco Use  Smoking Status Former Smoker  . Types: Cigarettes  . Quit date: 9  . Years since quitting: 24.1  Smokeless Tobacco Former Systems developer  . Types: Chew  . Quit date: 2002    Allergies  Allergen Reactions  . Other   .  Short Ragweed Pollen Ext   . Ambien [Zolpidem Tartrate] Other (See Comments)   Objective:  There were no vitals filed for this visit. There is no height or weight on file to calculate BMI. Constitutional Well developed. Well nourished.  Vascular Foot warm and well perfused. Capillary refill normal to all digits.   Neurologic Normal speech. Oriented to person, place, and time. Epicritic sensation to light touch grossly present bilaterally.  Dermatologic Skin healing well without signs of infection. Skin edges well coapted without signs of infection except for distal incision there is superficial dehiscence without any clinical signs of infection.  Achilles strength manual muscle testing is 5 out of 5.  The contour of the Achilles is intact.  No ruptures identified.  Orthopedic: Tenderness to palpation noted about the surgical site.   Radiographs: None Assessment:   1. Superficial disruption or dehiscence of operation wound, initial encounter   2. Achilles tendinitis, right leg   3. Gastrocnemius equinus, right    Plan:  Patient was evaluated and treated and all questions answered.  S/p foot surgery right -Progressing as expected post-operatively. -XR: None -WB Status: Nonweightbearing to the right lower extremity in knee scooter -Sutures/staples: Intact.  No signs of dehiscence noted.  No complication noted. -Medications: None -Superficial dehiscence noted measured to 0.2 x 0.3 cm  x 0.2 cm.  Fibrogranular wound bed.  Continue doing Betadine wet-to-dry dressing changes daily. -Achilles strength is 5 out of 5.  Overall the ankles are intact.  The contour of the Achilles is well intact. No follow-ups on file.

## 2020-06-16 ENCOUNTER — Other Ambulatory Visit: Payer: Self-pay

## 2020-06-16 ENCOUNTER — Ambulatory Visit (INDEPENDENT_AMBULATORY_CARE_PROVIDER_SITE_OTHER): Payer: Medicare Other | Admitting: Podiatry

## 2020-06-16 DIAGNOSIS — M7661 Achilles tendinitis, right leg: Secondary | ICD-10-CM

## 2020-06-16 DIAGNOSIS — Z9889 Other specified postprocedural states: Secondary | ICD-10-CM

## 2020-06-16 DIAGNOSIS — M216X1 Other acquired deformities of right foot: Secondary | ICD-10-CM

## 2020-06-16 DIAGNOSIS — M21861 Other specified acquired deformities of right lower leg: Secondary | ICD-10-CM

## 2020-06-22 ENCOUNTER — Encounter: Payer: Self-pay | Admitting: Podiatry

## 2020-06-22 NOTE — Progress Notes (Signed)
6   Subjective:  Patient ID: Arthur Foster, male    DOB: [birth year],  MRN: [id number]  Chief Complaint  Patient presents with  . Routine Post Op    POST OP      70 y.o. male returns for post-op check.  Date of surgery 05/03/2020.  Right Achilles detachment/reattachment with repair with resection of Haglund's deformity with gastrocnemius recession.  Patient states he does not have any pain.  He is doing well overall.  He has been nonweightbearing to the right lower extremity with a knee scooter.  Review of Systems: Negative except as noted in the HPI. Denies N/V/F/Ch.  Past Medical History:  Diagnosis Date  . Anxiety   . BPH (benign prostatic hyperplasia)   . Depression   . Hyperlipidemia   . Hypertension   . Testosterone deficiency     Current Outpatient Medications:  .  ALPRAZolam (XANAX) 0.25 MG tablet, TAKE 1 OR 2 TABLETS DAILY AS NEEDED, Disp: 45 tablet, Rfl: 0 .  naproxen (NAPROSYN) 500 MG tablet, TAKE 1 TABLET 2 TIMES A DAY WITH MEALS, Disp: 60 tablet, Rfl: 0 .  Ascorbic Acid (VITAMIN C) 1000 MG tablet, Take 1,000 mg by mouth daily., Disp: , Rfl:  .  cetirizine (ZYRTEC) 10 MG tablet, TAKE 1 TABLET DAILY, Disp: 90 tablet, Rfl: 1 .  escitalopram (LEXAPRO) 20 MG tablet, Take 1 tablet (20 mg total) by mouth daily., Disp: 90 tablet, Rfl: 3 .  lisinopril (ZESTRIL) 20 MG tablet, Take 1 tablet (20 mg total) by mouth daily., Disp: 90 tablet, Rfl: 3 .  Testosterone (ANDROGEL) 20.25 MG/1.25GM (1.62%) GEL, Place 2 Pump onto the skin daily., Disp: 1.25 g, Rfl: 2 .  Vitamin D, Ergocalciferol, (DRISDOL) 1.25 MG (50000 UNIT) CAPS capsule, Take 1 capsule (50,000 Units total) by mouth once a week., Disp: 13 capsule, Rfl: 5  Social History   Tobacco Use  Smoking Status Former Smoker  . Types: Cigarettes  . Quit date: 46  . Years since quitting: 24.2  Smokeless Tobacco Former Systems developer  . Types: Chew  . Quit date: 2002    Allergies  Allergen Reactions  . Other   . Short Ragweed Pollen  Ext   . Ambien [Zolpidem Tartrate] Other (See Comments)   Objective:  There were no vitals filed for this visit. There is no height or weight on file to calculate BMI. Constitutional Well developed. Well nourished.  Vascular Foot warm and well perfused. Capillary refill normal to all digits.   Neurologic Normal speech. Oriented to person, place, and time. Epicritic sensation to light touch grossly present bilaterally.  Dermatologic  skin completely reepithelialized.  No further dehiscence noted..  Achilles strength manual muscle testing is 5 out of 5.  The contour of the Achilles is intact.  No ruptures identified.  Orthopedic: Tenderness to palpation noted about the surgical site.   Radiographs: None Assessment:   1. Achilles tendinitis, right leg   2. Gastrocnemius equinus, right   3. Status post right foot surgery    Plan:  Patient was evaluated and treated and all questions answered.  S/p foot surgery right -Progressing as expected post-operatively. -XR: None -WB Status: Nonweightbearing to the right lower extremity in knee scooter -Sutures/staples: Intact.  No signs of dehiscence noted.  No complication noted. -Medications: None -Achilles strength is 5 out of 5.  Overall the ankles are intact.  The contour of the Achilles is well intact. -I also believe patient will benefit from physical therapy as he may have a  difficult time given his size and the length of time he has been nonweightbearing to be able to condition back to walking without any limping.  He will be referred to physical therapy in [facility]. No follow-ups on file.

## 2020-06-24 ENCOUNTER — Ambulatory Visit (INDEPENDENT_AMBULATORY_CARE_PROVIDER_SITE_OTHER): Payer: Medicare Other | Admitting: Nurse Practitioner

## 2020-06-24 ENCOUNTER — Other Ambulatory Visit: Payer: Self-pay

## 2020-06-24 ENCOUNTER — Encounter: Payer: Self-pay | Admitting: Nurse Practitioner

## 2020-06-24 VITALS — BP 138/83 | HR 77 | Temp 97.1°F | Ht 73.0 in | Wt 321.0 lb

## 2020-06-24 DIAGNOSIS — Z23 Encounter for immunization: Secondary | ICD-10-CM

## 2020-06-24 DIAGNOSIS — M545 Low back pain, unspecified: Secondary | ICD-10-CM | POA: Diagnosis not present

## 2020-06-24 DIAGNOSIS — R300 Dysuria: Secondary | ICD-10-CM | POA: Insufficient documentation

## 2020-06-24 DIAGNOSIS — M549 Dorsalgia, unspecified: Secondary | ICD-10-CM | POA: Insufficient documentation

## 2020-06-24 LAB — URINALYSIS, COMPLETE
Bilirubin, UA: NEGATIVE
Glucose, UA: NEGATIVE
Leukocytes,UA: NEGATIVE
Nitrite, UA: NEGATIVE
Protein,UA: NEGATIVE
RBC, UA: NEGATIVE
Specific Gravity, UA: 1.025 (ref 1.005–1.030)
Urobilinogen, Ur: 1 mg/dL (ref 0.2–1.0)
pH, UA: 5.5 (ref 5.0–7.5)

## 2020-06-24 LAB — MICROSCOPIC EXAMINATION: RBC, Urine: NONE SEEN /hpf (ref 0–2)

## 2020-06-24 MED ORDER — IBUPROFEN 800 MG PO TABS: 800.0000 mg | ORAL_TABLET | Freq: Three times a day (TID) | ORAL | 0 refills | Status: DC | PRN

## 2020-06-24 MED ORDER — POLYETHYLENE GLYCOL 3350 17 GM/SCOOP PO POWD: 17.0000 g | Freq: Two times a day (BID) | ORAL | 1 refills | Status: DC | PRN

## 2020-06-24 MED ORDER — METHOCARBAMOL 500 MG PO TABS: 500.0000 mg | ORAL_TABLET | Freq: Four times a day (QID) | ORAL | 0 refills | Status: DC

## 2020-06-24 NOTE — Progress Notes (Signed)
Acute Office Visit  Subjective:    Patient ID: Arthur Foster, male    DOB: [birth year], 70 y.o.   MRN: [id number]  No chief complaint on file.   HPI   Patient is in today for Back Pain: Patient presents for presents evaluation of low back problems.  Symptoms have been present for a few days and include soreness. Initial inciting event: none. Symptoms are worst: mid-day, afternoon. Alleviating factors identifiable by patient are bending forwards. Exacerbating factors identifiable by patient are sitting. Treatments so far initiated by patient: Naproxen Previous lower back problems: none. Previous workup: none. Previous treatments: none  Urinary symptoms  He reports new onset dysuria. The current episode started a few days ago and is gradually improving. Patient states symptoms are mild in intensity, occurring intermittently. He  has not been recently treated for similar symptoms.    Associated symptoms: No abdominal pain Yes back pain  No chills Yes constipation  No cramping No diarrhea  No discharge No fever  No hematuria No nausea  No vomiting    ---------------------------------------------------------------------------------------   Past Medical History:  Diagnosis Date  . Anxiety   . BPH (benign prostatic hyperplasia)   . Depression   . Hyperlipidemia   . Hypertension   . Testosterone deficiency     Past Surgical History:  Procedure Laterality Date  . FOOT SURGERY    . HERNIA REPAIR Left inguinal    Family History  Problem Relation Age of Onset  . Hypertension Mother   . Alzheimer's disease Mother   . Hypertension Father   . Alzheimer's disease Father     Social History   Socioeconomic History  . Marital status: Married    Spouse name: [name]  . Number of children: 3  . Years of education: 79  . Highest education level: High school graduate  Occupational History  . Occupation: retired    Comment: works a few hours a week  Tobacco Use  . Smoking  status: Former Smoker    Types: Cigarettes    Quit date: 1998    Years since quitting: 24.2  . Smokeless tobacco: Former Systems developer    Types: [place] date: 2002  Media planner  . Vaping Use: Never used  Substance and Sexual Activity  . Alcohol use: Yes    Alcohol/week: 14.0 standard drinks    Types: 14 Cans of beer per week  . Drug use: No  . Sexual activity: Yes    Birth control/protection: None  Other Topics Concern  . Not on file  Social History Narrative  . Not on file   Social Determinants of Health   Financial Resource Strain: Not on file  Food Insecurity: Not on file  Transportation Needs: Not on file  Physical Activity: Not on file  Stress: Not on file  Social Connections: Not on file  Intimate Partner Violence: Not on file    Outpatient Medications Prior to Visit  Medication Sig Dispense Refill  . ALPRAZolam (XANAX) 0.25 MG tablet TAKE 1 OR 2 TABLETS DAILY AS NEEDED 45 tablet 0  . naproxen (NAPROSYN) 500 MG tablet TAKE 1 TABLET 2 TIMES A DAY WITH MEALS 60 tablet 0  . Ascorbic Acid (VITAMIN C) 1000 MG tablet Take 1,000 mg by mouth daily.    . cetirizine (ZYRTEC) 10 MG tablet TAKE 1 TABLET DAILY 90 tablet 1  . escitalopram (LEXAPRO) 20 MG tablet Take 1 tablet (20 mg total) by mouth daily. 90 tablet 3  . lisinopril (  ZESTRIL) 20 MG tablet Take 1 tablet (20 mg total) by mouth daily. 90 tablet 3  . Testosterone (ANDROGEL) 20.25 MG/1.25GM (1.62%) GEL Place 2 Pump onto the skin daily. 1.25 g 2  . Vitamin D, Ergocalciferol, (DRISDOL) 1.25 MG (50000 UNIT) CAPS capsule Take 1 capsule (50,000 Units total) by mouth once a week. 13 capsule 5   No facility-administered medications prior to visit.    Allergies  Allergen Reactions  . Other   . Short Ragweed Pollen Ext   . Ambien [Zolpidem Tartrate] Other (See Comments)    Review of Systems  Constitutional: Negative.   HENT: Negative.   Cardiovascular: Negative.   Gastrointestinal: Negative.   Genitourinary: Positive for  dysuria. Negative for flank pain, frequency, hematuria, penile discharge and urgency.  Musculoskeletal: Positive for back pain.  All other systems reviewed and are negative.      Objective:    Physical Exam Vitals reviewed.  Constitutional:      General: He is awake.     Appearance: Normal appearance. He is well-groomed and overweight.     Interventions: Face mask in place.  HENT:     Head: Normocephalic.  Eyes:     Conjunctiva/sclera: Conjunctivae normal.  Cardiovascular:     Rate and Rhythm: Normal rate and regular rhythm.     Pulses: Normal pulses.     Heart sounds: Normal heart sounds.  Pulmonary:     Effort: Pulmonary effort is normal.     Breath sounds: Normal breath sounds.  Abdominal:     General: Bowel sounds are normal.     Tenderness: There is no right CVA tenderness or left CVA tenderness.  Musculoskeletal:        General: Tenderness present.  Skin:    General: Skin is warm.  Neurological:     Mental Status: He is alert and oriented to person, place, and time.  Psychiatric:        Behavior: Behavior normal. Behavior is cooperative.     There were no vitals taken for this visit. Wt Readings from Last 3 Encounters:  03/01/20 (!) 317 lb (143.8 kg)  11/24/19 (!) 312 lb (141.5 kg)  05/26/19 (!) 320 lb (145.2 kg)    Health Maintenance Due  Topic Date Due  . Hepatitis C Screening  Never done  . COVID-19 Vaccine (1) Never done  . PNA vac Low Risk Adult (2 of 2 - PPSV23) 05/25/2020    There are no preventive care reminders to display for this patient.   Lab Results  Component Value Date   TSH 1.970 05/26/2019   Lab Results  Component Value Date   WBC 6.1 05/25/2020   HGB 15.6 05/25/2020   HCT 47.0 05/25/2020   MCV 90 05/25/2020   PLT 377 05/25/2020   Lab Results  Component Value Date   NA 143 05/25/2020   K 5.2 05/25/2020   CO2 23 05/25/2020   GLUCOSE 113 (H) 05/25/2020   BUN 20 05/25/2020   CREATININE 1.12 05/25/2020   BILITOT 0.9  05/25/2020   ALKPHOS 70 05/25/2020   AST 26 05/25/2020   ALT 33 05/25/2020   PROT 7.1 05/25/2020   ALBUMIN 4.5 05/25/2020   CALCIUM 9.6 05/25/2020   Lab Results  Component Value Date   CHOL 217 (H) 05/25/2020   Lab Results  Component Value Date   HDL 47 05/25/2020   Lab Results  Component Value Date   LDLCALC 140 (H) 05/25/2020   Lab Results  Component Value Date  TRIG 168 (H) 05/25/2020   Lab Results  Component Value Date   CHOLHDL 4.6 05/25/2020   Lab Results  Component Value Date   HGBA1C 5.8 (H) 05/25/2020       Assessment & Plan:   Problem List Items Addressed This Visit      Other   Back pain - Primary    Acute low back pain in the last few days.  Patient reports bending over awkwardly and when he got up he started experiencing back pain.  Patient is reporting been sedentary more because of his leg surgery and that has also caused him to have constipation that may have led to his back pain. Education provided to patient with printed handouts given.  Advised patient to increase fiber and hydration, MiraLAX as needed. Follow-up with worsening unresolved symptoms.   Encouraged to use ibuprofen 800 mg as needed for back pain with meals, advised patient to not take ibuprofen at the same time with Lexapro.  Robaxin as needed.  Apply ice and back strengthening exercises.  Patient verbalized understanding.  Rx sent to pharmacy.      Relevant Medications   methocarbamol (ROBAXIN) 500 MG tablet   ibuprofen (ADVIL) 800 MG tablet   Dysuria    Patient reporting symptoms of dysuria in the last few days.  Patient is denying fever, nausea vomiting, pelvic pressure, discharge, foul urine and frequency.  Urinalysis negative for leukocytes, nitrites, only few bacteria present.  No CVA tenderness no signs and symptoms of active urinary tract infection.  Advised patient to continue hydration, monitor any worsening signs and follow-up appropriately.      Relevant Orders    Urinalysis, Complete (Completed)       Meds ordered this encounter  Medications  . methocarbamol (ROBAXIN) 500 MG tablet    Sig: Take 1 tablet (500 mg total) by mouth 4 (four) times daily.    Dispense:  30 tablet    Refill:  0    Order Specific Question:   Supervising Provider    Answer:   Janora Norlander [[id number]]  . ibuprofen (ADVIL) 800 MG tablet    Sig: Take 1 tablet (800 mg total) by mouth every 8 (eight) hours as needed.    Dispense:  30 tablet    Refill:  0    Order Specific Question:   Supervising Provider    Answer:   Janora Norlander [[id number]]  . polyethylene glycol powder (GLYCOLAX/MIRALAX) 17 GM/SCOOP powder    Sig: Take 17 g by mouth 2 (two) times daily as needed.    Dispense:  3350 g    Refill:  1    Order Specific Question:   Supervising Provider    Answer:   Janora Norlander [[id number]]     Ivy Lynn, NP

## 2020-06-24 NOTE — Assessment & Plan Note (Addendum)
Acute low back pain in the last few days.  Patient reports bending over awkwardly and when he got up he started experiencing back pain.  Patient is reporting been sedentary more because of his leg surgery and that has also caused him to have constipation that may have led to his back pain. Education provided to patient with printed handouts given.  Advised patient to increase fiber and hydration, MiraLAX as needed. Follow-up with worsening unresolved symptoms.   Encouraged to use ibuprofen 800 mg as needed for back pain with meals, advised patient to not take ibuprofen at the same time with Lexapro.  Robaxin as needed.  Apply ice and back strengthening exercises.  Patient verbalized understanding.  Rx sent to pharmacy.

## 2020-06-24 NOTE — Patient Instructions (Addendum)
Urinary Tract Infection, Adult A urinary tract infection (UTI) is an infection of any part of the urinary tract. The urinary tract includes:  The kidneys.  The ureters.  The bladder.  The urethra. These organs make, store, and get rid of pee (urine) in the body. What are the causes? This infection is caused by germs (bacteria) in your genital area. These germs grow and cause swelling (inflammation) of your urinary tract. What increases the risk? The following factors may make you more likely to develop this condition:  Using a small, thin tube (catheter) to drain pee.  Not being able to control when you pee or poop (incontinence).  Being male. If you are male, these things can increase the risk: ? Using these methods to prevent pregnancy:  A medicine that kills sperm (spermicide).  A device that blocks sperm (diaphragm). ? Having low levels of a male hormone (estrogen). ? Being pregnant. You are more likely to develop this condition if:  You have genes that add to your risk.  You are sexually active.  You take antibiotic medicines.  You have trouble peeing because of: ? A prostate that is bigger than normal, if you are male. ? A blockage in the part of your body that drains pee from the bladder. ? A kidney stone. ? A nerve condition that affects your bladder. ? Not getting enough to drink. ? Not peeing often enough.  You have other conditions, such as: ? Diabetes. ? A weak disease-fighting system (immune system). ? Sickle cell disease. ? Gout. ? Injury of the spine. What are the signs or symptoms? Symptoms of this condition include:  Needing to pee right away.  Peeing small amounts often.  Pain or burning when peeing.  Blood in the pee.  Pee that smells bad or not like normal.  Trouble peeing.  Pee that is cloudy.  Fluid coming from the vagina, if you are male.  Pain in the belly or lower back. Other symptoms include:  Vomiting.  Not  feeling hungry.  Feeling mixed up (confused). This may be the first symptom in older adults.  Being tired and grouchy (irritable).  A fever.  Watery poop (diarrhea). How is this treated?  Taking antibiotic medicine.  Taking other medicines.  Drinking enough water. In some cases, you may need to see a specialist. Follow these instructions at home: Medicines  Take over-the-counter and prescription medicines only as told by your doctor.  If you were prescribed an antibiotic medicine, take it as told by your doctor. Do not stop taking it even if you start to feel better. General instructions  Make sure you: ? Pee until your bladder is empty. ? Do not hold pee for a long time. ? Empty your bladder after sex. ? Wipe from front to back after peeing or pooping if you are a male. Use each tissue one time when you wipe.  Drink enough fluid to keep your pee pale yellow.  Keep all follow-up visits.   Contact a doctor if:  You do not get better after 1-2 days.  Your symptoms go away and then come back. Get help right away if:  You have very bad back pain.  You have very bad pain in your lower belly.  You have a fever.  You have chills.  You feeling like you will vomit or you vomit. Summary  A urinary tract infection (UTI) is an infection of any part of the urinary tract.  This condition is caused by   germs in your genital area.  There are many risk factors for a UTI.  Treatment includes antibiotic medicines.  Drink enough fluid to keep your pee pale yellow. This information is not intended to replace advice given to you by your health care provider. Make sure you discuss any questions you have with your health care provider. Document Revised: 10/31/2019 Document Reviewed: 10/31/2019 Elsevier Patient Education  2021 [employer].  Acute Back Pain, Adult Acute back pain is sudden and usually short-lived. It is often caused by an injury to the muscles and tissues  in the back. The injury may result from: A muscle or ligament getting overstretched or torn (strained). Ligaments are tissues that connect bones to each other. Lifting something improperly can cause a back strain. Wear and tear (degeneration) of the spinal disks. Spinal disks are circular tissue that provide cushioning between the bones of the spine (vertebrae). Twisting motions, such as while playing sports or doing yard work. A hit to the back. Arthritis. You may have a physical exam, lab tests, and imaging tests to find the cause of your pain. Acute back pain usually goes away with rest and home care. Follow these instructions at home: Managing pain, stiffness, and swelling Treatment may include medicines for pain and inflammation that are taken by mouth or applied to the skin, prescription pain medicine, or muscle relaxants. Take over-the-counter and prescription medicines only as told by your health care provider. Your health care provider may recommend applying ice during the first 24-48 hours after your pain starts. To do this: Put ice in a plastic bag. Place a towel between your skin and the bag. Leave the ice on for 20 minutes, 2-3 times a day. If directed, apply heat to the affected area as often as told by your health care provider. Use the heat source that your health care provider recommends, such as a moist heat pack or a heating pad. Place a towel between your skin and the heat source. Leave the heat on for 20-30 minutes. Remove the heat if your skin turns bright red. This is especially important if you are unable to feel pain, heat, or cold. You have a greater risk of getting burned. Activity Do not stay in bed. Staying in bed for more than 1-2 days can delay your recovery. Sit up and stand up straight. Avoid leaning forward when you sit or hunching over when you stand. If you work at a desk, sit close to it so you do not need to lean over. Keep your chin tucked in. Keep your  neck drawn back, and keep your elbows bent at a 90-degree angle (right angle). Sit high and close to the steering wheel when you drive. Add lower back (lumbar) support to your car seat, if needed. Take short walks on even surfaces as soon as you are able. Try to increase the length of time you walk each day. Do not sit, drive, or stand in one place for more than 30 minutes at a time. Sitting or standing for long periods of time can put stress on your back. Do not drive or use heavy machinery while taking prescription pain medicine. Use proper lifting techniques. When you bend and lift, use positions that put less stress on your back: [place] your knees. Keep the load close to your body. Avoid twisting. Exercise regularly as told by your health care provider. Exercising helps your back heal faster and helps prevent back injuries by keeping muscles strong and flexible. Work with  a physical therapist to make a safe exercise program, as recommended by your health care provider. Do any exercises as told by your physical therapist.   Lifestyle Maintain a healthy weight. Extra weight puts stress on your back and makes it difficult to have good posture. Avoid activities or situations that make you feel anxious or stressed. Stress and anxiety increase muscle tension and can make back pain worse. Learn ways to manage anxiety and stress, such as through exercise. General instructions Sleep on a firm mattress in a comfortable position. Try lying on your side with your knees slightly bent. If you lie on your back, put a pillow under your knees. Follow your treatment plan as told by your health care provider. This may include: Cognitive or behavioral therapy. Acupuncture or massage therapy. Meditation or yoga. Contact a health care provider if: You have pain that is not relieved with rest or medicine. You have increasing pain going down into your legs or buttocks. Your pain does not improve after 2 weeks. You  have pain at night. You lose weight without trying. You have a fever or chills. Get help right away if: You develop new bowel or bladder control problems. You have unusual weakness or numbness in your arms or legs. You develop nausea or vomiting. You develop abdominal pain. You feel faint. Summary Acute back pain is sudden and usually short-lived. Use proper lifting techniques. When you bend and lift, use positions that put less stress on your back. Take over-the-counter and prescription medicines and apply heat or ice as directed by your health care provider. This information is not intended to replace advice given to you by your health care provider. Make sure you discuss any questions you have with your health care provider. Document Revised: 12/12/2019 Document Reviewed: 12/12/2019 Elsevier Patient Education  2021 [employer].

## 2020-06-24 NOTE — Assessment & Plan Note (Signed)
Patient reporting symptoms of dysuria in the last few days.  Patient is denying fever, nausea vomiting, pelvic pressure, discharge, foul urine and frequency.  Urinalysis negative for leukocytes, nitrites, only few bacteria present.  No CVA tenderness no signs and symptoms of active urinary tract infection.  Advised patient to continue hydration, monitor any worsening signs and follow-up appropriately.

## 2020-06-25 ENCOUNTER — Other Ambulatory Visit: Payer: Medicare Other

## 2020-06-29 ENCOUNTER — Ambulatory Visit: Payer: Medicare Other | Attending: Podiatry | Admitting: Physical Therapy

## 2020-06-29 ENCOUNTER — Encounter: Payer: Self-pay | Admitting: Physical Therapy

## 2020-06-29 ENCOUNTER — Other Ambulatory Visit: Payer: Self-pay

## 2020-06-29 DIAGNOSIS — R600 Localized edema: Secondary | ICD-10-CM | POA: Diagnosis not present

## 2020-06-29 DIAGNOSIS — M25571 Pain in right ankle and joints of right foot: Secondary | ICD-10-CM

## 2020-06-29 DIAGNOSIS — M25671 Stiffness of right ankle, not elsewhere classified: Secondary | ICD-10-CM

## 2020-06-29 NOTE — Therapy (Signed)
[facility] [place], [place], [address] Phone: [phone]   Fax:  [phone]  Physical Therapy Evaluation  Patient Details  Name: Arthur Foster MRN: [id number] Date of Birth: Mar 04, 1951 Referring Provider (PT): Murvin Natal DPM   Encounter Date: 06/29/2020   PT End of Session - 06/29/20 1251    Visit Number 1    Number of Visits 12    Date for PT Re-Evaluation 08/10/20    Authorization Type FOTO AT LEAST EVERY 5TH VISIT.  PROGRESS NOTE AT 10TH VISIT.  KX MODIFIER AFTER 15 VISITS.    PT Start Time 0825    PT Stop Time 0913    PT Time Calculation (min) 48 min    Activity Tolerance Patient tolerated treatment well    Behavior During Therapy WFL for tasks assessed/performed           Past Medical History:  Diagnosis Date  . Anxiety   . BPH (benign prostatic hyperplasia)   . Depression   . Hyperlipidemia   . Hypertension   . Testosterone deficiency     Past Surgical History:  Procedure Laterality Date  . FOOT SURGERY    . HERNIA REPAIR Left inguinal    There were no vitals filed for this visit.    Subjective Assessment - 06/29/20 1026    Subjective COVID-19 screen performed prior to patient entering clinic.  The patient presents to the clinic today s/p right Achilles tendon reconstruction performed on 05/03/20.  He presented to the clinic today out of his CAM boot though he states tomorrow is the day he is cleared to do so.  He does admits that there have been times over the last several days in which he has walked without his CAM boot.  His pain has stayed quite low, however.  Today, he reports no pain.    Pertinent History Prior right foot surgery.    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    Currently in Pain? No/denies              [facility] PT Assessment - 06/29/20 0001      Assessment   Medical Diagnosis S/p right foot surgery.     Referring Provider (PT) Murvin Natal DPM    Onset Date/Surgical Date --   05/03/20 (surgery date).     Precautions   Precaution Comments Progress per Achilles reconstruction protocol.      Restrictions   Weight Bearing Restrictions No      Balance Screen   Has the patient fallen in the past 6 months Yes    How many times? 1.    Has the patient had a decrease in activity level because of a fear of falling?  No    Is the patient reluctant to leave their home because of a fear of falling?  No      Home Environment   Living Environment Private residence      Observation/Other Assessments   Observations Scabbed/dry skin at right Achilles at heel.      Observation/Other Assessments-Edema    Edema --   Minright/foot ankle swelling per contralateral comparison     ROM / Strength   AROM / PROM / Strength AROM      AROM   Overall AROM Comments Active right ankle dorsiflexion with knee in full extension is 8 degrees and plantarflexion is 30 degrees.  Palpation   Palpation comment Mild palpable tenderness over patient's right calf and Achilles attachment on calcaneus.      Ambulation/Gait   Gait Comments The patient presented to the clinic in normal footwear and he is walking flatfooted and wbat over his right LE.                      Objective measurements completed on examination: See above findings.       [facility] Adult PT Treatment/Exercise - 06/29/20 0001      Modalities   Modalities Vasopneumatic      Vasopneumatic   Number Minutes Vasopneumatic  15 minutes    Vasopnuematic Location  --   RT ankle.   Vasopneumatic Pressure Low                  PT Education - 06/29/20 1246    Education Details Ankle ABC's.    Person(s) Educated Patient    Methods Explanation;Demonstration    Comprehension Returned demonstration;Verbalized understanding               PT Long Term Goals - 06/29/20 1259      PT LONG TERM GOAL #1   Title ind with a HEP.     Time 6    Period Weeks    Status New      PT LONG TERM GOAL #2   Title Perform ADL's with pain not > 3/10.    Time 6    Period Weeks    Status New      PT LONG TERM GOAL #3   Title Return to work.    Time 6    Period Weeks    Status New      PT LONG TERM GOAL #4   Title Walk a community distance with deviation.    Time 6    Period Weeks    Status New                  Plan - 06/29/20 1252    Clinical Impression Statement The patient presents to [facility] s/p right Achilles tendon reconstruction.  He presented to the clinic in normal footwear and wbat over his right LE and is walking in a flat-footed fashion.  His active right ankle dorsiflexion is quite good.  Plantarflexion is limited currently.  His right ankle edema in minimal per contralateral comparison.  He reported no pain this morning.  Patient will benefit from skilled physical therapy intervention to address pain and deficits.    Personal Factors and Comorbidities Comorbidity 1;Other    Comorbidities Prior right foot surgery.    Examination-Activity Limitations Other;Locomotion Level    Examination-Participation Restrictions Tour manager    Stability/Clinical Decision Making Stable/Uncomplicated    Clinical Decision Making Low    Rehab Potential Excellent    PT Frequency 2x / week    PT Duration 6 weeks    PT Treatment/Interventions ADLs/Self Care Home Management;Cryotherapy;Electrical Stimulation;Ultrasound;Moist Heat;Iontophoresis 4mg /ml Dexamethasone;Gait training;Functional mobility training;Therapeutic exercise;Neuromuscular re-education;Therapeutic activities;Patient/family education;Manual techniques;Vasopneumatic Device    PT Next Visit Plan Please progress per Achilles reconstruction protocol.  Begin with seated Rockerboard/BAPB board.  Vasopneumatic and e'stim as needed.    Consulted and Agree with Plan of Care Patient           Patient will benefit from skilled therapeutic intervention in order  to improve the following deficits and impairments:  Pain,Abnormal gait,Decreased range of motion,Increased edema,Decreased activity tolerance  Visit Diagnosis: Pain in right  ankle and joints of right foot - Plan: PT plan of care cert/re-cert  Stiffness of right ankle, not elsewhere classified - Plan: PT plan of care cert/re-cert  Localized edema - Plan: PT plan of care cert/re-cert     Problem List Patient Active Problem List   Diagnosis Date Noted  . Back pain 06/24/2020  . Dysuria 06/24/2020  . Controlled substance agreement signed 11/22/2018  . Blood glucose elevated 11/22/2018  . Vitamin D deficiency 11/22/2018  . Sensorineural hearing loss (SNHL), bilateral 06/17/2018  . 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  . Mixed dyslipidemia 08/08/2017  . Abnormal EKG 08/08/2017  . Cervical somatic dysfunction 03/06/2016  . 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 Generalized anxiety disorder 03/06/2016  . Left knee pain 11/05/2014  . Allergic rhinitis 06/18/2013  . Morbid obesity ([place]) 06/18/2013  . BPH (benign prostatic hypertrophy) 07/03/2012  . Essential hypertension, benign 07/03/2012  . Depression, recurrent ([place]) 07/03/2012  . GERD (gastroesophageal reflux disease) 07/03/2012  . Testosterone deficiency 07/03/2012    Yehudis Monceaux, [name] 06/29/2020, 1:01 PM  [facility] [address] [place], [place], [address] Phone: [phone]   Fax:  [phone]  Name: Hillis Mcphatter MRN: [id number] Date of Birth: February 11, 1951

## 2020-07-05 ENCOUNTER — Other Ambulatory Visit: Payer: Self-pay

## 2020-07-05 ENCOUNTER — Ambulatory Visit: Payer: Medicare Other | Attending: Podiatry | Admitting: Physical Therapy

## 2020-07-05 DIAGNOSIS — M25571 Pain in right ankle and joints of right foot: Secondary | ICD-10-CM | POA: Insufficient documentation

## 2020-07-05 DIAGNOSIS — M25671 Stiffness of right ankle, not elsewhere classified: Secondary | ICD-10-CM | POA: Insufficient documentation

## 2020-07-05 DIAGNOSIS — R600 Localized edema: Secondary | ICD-10-CM | POA: Diagnosis not present

## 2020-07-05 NOTE — Therapy (Signed)
[facility] [place], [place], [address] Phone: [phone]   Fax:  [phone]  Physical Therapy Treatment  Patient Details  Name: Arthur Foster MRN: [id number] Date of Birth: [birth year] Referring Provider (PT): Murvin Natal DPM   Encounter Date: 07/05/2020   PT End of Session - 07/05/20 0942    Visit Number 2    Number of Visits 12    Date for PT Re-Evaluation 08/10/20    Authorization Type FOTO AT LEAST EVERY 5TH VISIT.  PROGRESS NOTE AT 10TH VISIT.  KX MODIFIER AFTER 15 VISITS.    PT Start Time [phone]    PT Stop Time 0853    PT Time Calculation (min) 36 min    Activity Tolerance Patient tolerated treatment well    Behavior During Therapy [facility] for tasks assessed/performed           Past Medical History:  Diagnosis Date  . Anxiety   . BPH (benign prostatic hyperplasia)   . Depression   . Hyperlipidemia   . Hypertension   . Testosterone deficiency     Past Surgical History:  Procedure Laterality Date  . FOOT SURGERY    . HERNIA REPAIR Left inguinal    There were no vitals filed for this visit.   Subjective Assessment - 07/05/20 0840    Subjective COVID-19 screen performed prior to patient entering clinic.  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  Ankle swelled a lot.    Pertinent History Prior right foot surgery.    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    Currently in Pain? Yes    Pain Score 2     Pain Location Ankle    Pain Orientation Right    Pain Descriptors / Indicators Aching                             [facility] Adult PT Treatment/Exercise - 07/05/20 0001      Modalities   Modalities Electrical Stimulation;Vasopneumatic      Electrical Stimulation   Electrical Stimulation Location Right heel    Electrical Stimulation Action Pre-mod.    Electrical Stimulation Parameters 80-150 Hz x 20 minutes.       Vasopneumatic   Number Minutes Vasopneumatic  20 minutes    Vasopnuematic Location  --   RT ankle.   Vasopneumatic Pressure Low      Manual Therapy   Manual Therapy Soft tissue mobilization    Soft tissue mobilization STW/M and gentle range of motion (AA) x 10 minutes to patient's right ankle.                       PT Long Term Goals - 06/29/20 1259      PT LONG TERM GOAL #1   Title ind with a HEP.    Time 6    Period Weeks    Status New      PT LONG TERM GOAL #2   Title Perform ADL's with pain not > 3/10.    Time 6    Period Weeks    Status New      PT LONG TERM GOAL #3   Title Return to work.    Time 6    Period Weeks    Status New      PT LONG  TERM GOAL #4   Title Walk a community distance with deviation.    Time 6    Period Weeks    Status New                 Plan - 07/05/20 5456    Clinical Impression Statement Patients admits to doing a lot at home and increased ankle swelling but he did very well with treatment today. We discussed beginning seated Rockerboard next visit.    Personal Factors and Comorbidities Comorbidity 1;Other    Comorbidities Prior right foot surgery.    Examination-Activity Limitations Other;Locomotion Level    Examination-Participation Restrictions Tour manager    Stability/Clinical Decision Making Stable/Uncomplicated    Rehab Potential Excellent    PT Duration 6 weeks    PT Treatment/Interventions ADLs/Self Care Home Management;Cryotherapy;Electrical Stimulation;Ultrasound;Moist Heat;Iontophoresis 4mg /ml Dexamethasone;Gait training;Functional mobility training;Therapeutic exercise;Neuromuscular re-education;Therapeutic activities;Patient/family education;Manual techniques;Vasopneumatic Device    PT Next Visit Plan Please progress per Achilles reconstruction protocol.  Begin with seated Rockerboard/BAPB board.  Vasopneumatic and e'stim as needed.    Consulted and Agree with Plan of Care Patient            Patient will benefit from skilled therapeutic intervention in order to improve the following deficits and impairments:  Pain,Abnormal gait,Decreased range of motion,Increased edema,Decreased activity tolerance  Visit Diagnosis: Pain in right ankle and joints of right foot  Stiffness of right ankle, not elsewhere classified  Localized edema     Problem List Patient Active Problem List   Diagnosis Date Noted  . Back pain 06/24/2020  . Dysuria 06/24/2020  . Controlled substance agreement signed 11/22/2018  . Blood glucose elevated 11/22/2018  . Vitamin D deficiency 11/22/2018  . Sensorineural hearing loss (SNHL), bilateral 06/17/2018  . 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  . Mixed dyslipidemia 08/08/2017  . Abnormal EKG 08/08/2017  . Cervical somatic dysfunction 03/06/2016  . 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 Generalized anxiety disorder 03/06/2016  . Left knee pain 11/05/2014  . Allergic rhinitis 06/18/2013  . Morbid obesity ([place]) 06/18/2013  . BPH (benign prostatic hypertrophy) 07/03/2012  . Essential hypertension, benign 07/03/2012  . Depression, recurrent ([place]) 07/03/2012  . GERD (gastroesophageal reflux disease) 07/03/2012  . Testosterone deficiency 07/03/2012    Larra Crunkleton, [name] 07/05/2020, 9:43 AM  [facility] [address] [place], [place], [address] Phone: [phone]   Fax:  [phone]  Name: Arthur Foster MRN: [id number] Date of Birth: 20-Apr-1950

## 2020-07-08 ENCOUNTER — Ambulatory Visit: Payer: Medicare Other | Admitting: Physical Therapy

## 2020-07-08 ENCOUNTER — Other Ambulatory Visit: Payer: Self-pay

## 2020-07-08 ENCOUNTER — Encounter: Payer: Self-pay | Admitting: Physical Therapy

## 2020-07-08 DIAGNOSIS — M25571 Pain in right ankle and joints of right foot: Secondary | ICD-10-CM | POA: Diagnosis not present

## 2020-07-08 DIAGNOSIS — M25671 Stiffness of right ankle, not elsewhere classified: Secondary | ICD-10-CM

## 2020-07-08 DIAGNOSIS — R600 Localized edema: Secondary | ICD-10-CM

## 2020-07-08 NOTE — Therapy (Signed)
[facility] [place], [place], [address] Phone: [phone]   Fax:  [phone]  Physical Therapy Treatment  Patient Details  Name: Arthur Foster MRN: [id number] Date of Birth: 1950-07-03 Referring Provider (PT): Murvin Natal DPM   Encounter Date: 07/08/2020   PT End of Session - 07/08/20 0812    Visit Number 3    Number of Visits 12    Date for PT Re-Evaluation 08/10/20    Authorization Type FOTO AT LEAST EVERY 5TH VISIT.  PROGRESS NOTE AT 10TH VISIT.  KX MODIFIER AFTER 15 VISITS.    PT Start Time 0819    PT Stop Time 0905    PT Time Calculation (min) 46 min    Activity Tolerance Patient tolerated treatment well    Behavior During Therapy [facility] for tasks assessed/performed           Past Medical History:  Diagnosis Date  . Anxiety   . BPH (benign prostatic hyperplasia)   . Depression   . Hyperlipidemia   . Hypertension   . Testosterone deficiency     Past Surgical History:  Procedure Laterality Date  . FOOT SURGERY    . HERNIA REPAIR Left inguinal    There were no vitals filed for this visit.   Subjective Assessment - 07/08/20 0811    Subjective COVID-19 screen performed prior to patient entering clinic. Reports that he went down a hill yesterday but his foot is still swelling. Reports using ice packs at home but states that in the mornings when he gets up he sees the biggest difference in swelling.    Pertinent History Prior right foot surgery.    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    Currently in Pain? No/denies              [facility] PT Assessment - 07/08/20 0001      Assessment   Medical Diagnosis S/p right foot surgery.    Referring Provider (PT) Murvin Natal DPM    Next MD Visit 07/15/2020      Precautions   Precaution Comments Progress per Achilles reconstruction protocol.      Restrictions   Weight Bearing Restrictions No                          [facility] Adult PT Treatment/Exercise - 07/08/20 0001      Exercises   Exercises Ankle      Modalities   Modalities Vasopneumatic      Vasopneumatic   Number Minutes Vasopneumatic  10 minutes    Vasopnuematic Location  Ankle    Vasopneumatic Pressure Low    Vasopneumatic Temperature  34/edema      Ankle Exercises: Aerobic   Nustep L4, seat 15 x10 min   LEs only     Ankle Exercises: Seated   Other Seated Ankle Exercises Rockerboard x5 min (DF/PF), BAPS x5 min (PF/DF, inv/ev)    Other Seated Ankle Exercises dynadisc (PF/DF, inv/ev, circles)                       PT Long Term Goals - 06/29/20 1259      PT LONG TERM GOAL #1   Title ind with a HEP.    Time 6    Period Weeks    Status New      PT LONG TERM  GOAL #2   Title Perform ADL's with pain not > 3/10.    Time 6    Period Weeks    Status New      PT LONG TERM GOAL #3   Title Return to work.    Time 6    Period Weeks    Status New      PT LONG TERM GOAL #4   Title Walk a community distance with deviation.    Time 6    Period Weeks    Status New                 Plan - 07/08/20 0959    Clinical Impression Statement Patient presented in clinic with reports of increased edema by the end of the day. Patient walked up the hill at his home but trying to limit overdoing and progressing himself too much. Patient introducted to light, seated stretching and ROM. Stretching sensation indicated by patient but no pain. Normal vasopneumatic response noted following removal of the modality.    Personal Factors and Comorbidities Comorbidity 1;Other    Comorbidities Prior right foot surgery.    Examination-Activity Limitations Other;Locomotion Level    Examination-Participation Restrictions Tour manager    Stability/Clinical Decision Making Stable/Uncomplicated    Rehab Potential Excellent    PT Frequency 2x / week    PT Duration 6 weeks    PT Treatment/Interventions  ADLs/Self Care Home Management;Cryotherapy;Electrical Stimulation;Ultrasound;Moist Heat;Iontophoresis 4mg /ml Dexamethasone;Gait training;Functional mobility training;Therapeutic exercise;Neuromuscular re-education;Therapeutic activities;Patient/family education;Manual techniques;Vasopneumatic Device    PT Next Visit Plan Please progress per Achilles reconstruction protocol.  Begin with seated Rockerboard/BAPB board.  Vasopneumatic and e'stim as needed.    Consulted and Agree with Plan of Care Patient           Patient will benefit from skilled therapeutic intervention in order to improve the following deficits and impairments:  Pain,Abnormal gait,Decreased range of motion,Increased edema,Decreased activity tolerance  Visit Diagnosis: Pain in right ankle and joints of right foot  Stiffness of right ankle, not elsewhere classified  Localized edema     Problem List Patient Active Problem List   Diagnosis Date Noted  . Back pain 06/24/2020  . Dysuria 06/24/2020  . Controlled substance agreement signed 11/22/2018  . Blood glucose elevated 11/22/2018  . Vitamin D deficiency 11/22/2018  . Sensorineural hearing loss (SNHL), bilateral 06/17/2018  . 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  . Mixed dyslipidemia 08/08/2017  . Abnormal EKG 08/08/2017  . Cervical somatic dysfunction 03/06/2016  . 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 Generalized anxiety disorder 03/06/2016  . Left knee pain 11/05/2014  . Allergic rhinitis 06/18/2013  . Morbid obesity ([place]) 06/18/2013  . BPH (benign prostatic hypertrophy) 07/03/2012  . Essential hypertension, benign 07/03/2012  . Depression, recurrent ([place]) 07/03/2012  . GERD (gastroesophageal reflux disease) 07/03/2012  . Testosterone deficiency 07/03/2012    Standley Brooking, PTA 07/08/2020, 10:04 AM  [facility] [place], [place], [address] Phone: [phone]   Fax:  [phone]  Name:  Arthur Foster MRN: [id number] Date of Birth: 31-May-1950

## 2020-07-12 ENCOUNTER — Ambulatory Visit: Payer: Medicare Other | Admitting: Physical Therapy

## 2020-07-12 ENCOUNTER — Other Ambulatory Visit: Payer: Self-pay

## 2020-07-12 DIAGNOSIS — M25571 Pain in right ankle and joints of right foot: Secondary | ICD-10-CM

## 2020-07-12 DIAGNOSIS — R600 Localized edema: Secondary | ICD-10-CM

## 2020-07-12 DIAGNOSIS — M25671 Stiffness of right ankle, not elsewhere classified: Secondary | ICD-10-CM

## 2020-07-12 NOTE — Therapy (Signed)
[facility] [place], [place], [address] Phone: [phone]   Fax:  [phone]  Physical Therapy Treatment  Patient Details  Name: Arthur Foster MRN: [id number] Date of Birth: March 07, 1951 Referring Provider (PT): Murvin Natal DPM   Encounter Date: 07/12/2020   PT End of Session - 07/12/20 0851    Visit Number 4    Number of Visits 12    Date for PT Re-Evaluation 08/10/20    Authorization Type FOTO AT LEAST EVERY 5TH VISIT.  PROGRESS NOTE AT 10TH VISIT.  KX MODIFIER AFTER 15 VISITS.    PT Start Time 0815    PT Stop Time 0856    PT Time Calculation (min) 41 min    Activity Tolerance Patient tolerated treatment well    Behavior During Therapy [facility] for tasks assessed/performed           Past Medical History:  Diagnosis Date  . Anxiety   . BPH (benign prostatic hyperplasia)   . Depression   . Hyperlipidemia   . Hypertension   . Testosterone deficiency     Past Surgical History:  Procedure Laterality Date  . FOOT SURGERY    . HERNIA REPAIR Left inguinal    There were no vitals filed for this visit.   Subjective Assessment - 07/12/20 0848    Subjective COVID-19 screen performed prior to patient entering clinic.  Spent a lot of time in yard last weekend.  Sore but doing good.    Pertinent History Prior right foot surgery.    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    Currently in Pain? Yes    Pain Location Ankle    Pain Orientation Right    Pain Descriptors / Indicators Aching                             [facility] Adult PT Treatment/Exercise - 07/12/20 0001      Modalities   Modalities Electrical Stimulation;Vasopneumatic      Electrical Stimulation   Electrical Stimulation Location RT heel/calf.    Electrical Stimulation Action Pre-mod.    Electrical Stimulation Parameters 80-150 Hz x 20 minutes.    Electrical Stimulation  Goals Pain;Edema      Vasopneumatic   Number Minutes Vasopneumatic  20 minutes    Vasopnuematic Location  --   RT ankle.   Vasopneumatic Pressure Low      Manual Therapy   Manual Therapy Soft tissue mobilization    Soft tissue mobilization STW/M and gentle PA/AAROM to patient's right ankle x 10 minutes.                       PT Long Term Goals - 06/29/20 1259      PT LONG TERM GOAL #1   Title ind with a HEP.    Time 6    Period Weeks    Status New      PT LONG TERM GOAL #2   Title Perform ADL's with pain not > 3/10.    Time 6    Period Weeks    Status New      PT LONG TERM GOAL #3   Title Return to work.    Time 6    Period Weeks    Status New      PT LONG TERM GOAL #4  Title Walk a community distance with deviation.    Time 6    Period Weeks    Status New                 Plan - 07/12/20 9518    Clinical Impression Statement Patient doing well.  Sore today due to a busy weekend.  Continued right ankel swelling.    Personal Factors and Comorbidities Comorbidity 1;Other    Comorbidities Prior right foot surgery.    Examination-Activity Limitations Other;Locomotion Level           Patient will benefit from skilled therapeutic intervention in order to improve the following deficits and impairments:     Visit Diagnosis: Pain in right ankle and joints of right foot  Stiffness of right ankle, not elsewhere classified  Localized edema     Problem List Patient Active Problem List   Diagnosis Date Noted  . Back pain 06/24/2020  . Dysuria 06/24/2020  . Controlled substance agreement signed 11/22/2018  . Blood glucose elevated 11/22/2018  . Vitamin D deficiency 11/22/2018  . Sensorineural hearing loss (SNHL), bilateral 06/17/2018  . 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  . Mixed dyslipidemia 08/08/2017  . Abnormal EKG 08/08/2017  . Cervical somatic dysfunction 03/06/2016  . 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 Generalized anxiety  disorder 03/06/2016  . Left knee pain 11/05/2014  . Allergic rhinitis 06/18/2013  . Morbid obesity ([place]) 06/18/2013  . BPH (benign prostatic hypertrophy) 07/03/2012  . Essential hypertension, benign 07/03/2012  . Depression, recurrent ([place]) 07/03/2012  . GERD (gastroesophageal reflux disease) 07/03/2012  . Testosterone deficiency 07/03/2012    Jareth Pardee, [place]  MPT 07/12/2020, 9:08 AM  [facility] [address] [place], [place], [address] Phone: [phone]   Fax:  [phone]  Name: Elsworth Ledin MRN: [id number] Date of Birth: 1950-06-06

## 2020-07-13 ENCOUNTER — Other Ambulatory Visit: Payer: Self-pay

## 2020-07-13 ENCOUNTER — Other Ambulatory Visit: Payer: Medicare Other

## 2020-07-13 ENCOUNTER — Other Ambulatory Visit: Payer: Self-pay | Admitting: *Deleted

## 2020-07-13 DIAGNOSIS — R7989 Other specified abnormal findings of blood chemistry: Secondary | ICD-10-CM

## 2020-07-14 ENCOUNTER — Ambulatory Visit (INDEPENDENT_AMBULATORY_CARE_PROVIDER_SITE_OTHER): Payer: Medicare Other | Admitting: Family Medicine

## 2020-07-14 ENCOUNTER — Ambulatory Visit (INDEPENDENT_AMBULATORY_CARE_PROVIDER_SITE_OTHER): Payer: Medicare Other | Admitting: Podiatry

## 2020-07-14 ENCOUNTER — Encounter: Payer: Self-pay | Admitting: Podiatry

## 2020-07-14 ENCOUNTER — Encounter: Payer: Self-pay | Admitting: Family Medicine

## 2020-07-14 VITALS — BP 143/72 | HR 87 | Ht 73.0 in | Wt 319.0 lb

## 2020-07-14 DIAGNOSIS — J3081 Allergic rhinitis due to animal (cat) (dog) hair and dander: Secondary | ICD-10-CM

## 2020-07-14 DIAGNOSIS — M7661 Achilles tendinitis, right leg: Secondary | ICD-10-CM

## 2020-07-14 DIAGNOSIS — M21861 Other specified acquired deformities of right lower leg: Secondary | ICD-10-CM

## 2020-07-14 DIAGNOSIS — Z9889 Other specified postprocedural states: Secondary | ICD-10-CM

## 2020-07-14 DIAGNOSIS — M216X1 Other acquired deformities of right foot: Secondary | ICD-10-CM

## 2020-07-14 MED ORDER — MONTELUKAST SODIUM 10 MG PO TABS: 10.0000 mg | ORAL_TABLET | Freq: Every day | ORAL | 0 refills | Status: DC

## 2020-07-14 MED ORDER — EPINEPHRINE 0.3 MG/0.3ML IJ SOAJ: 0.3000 mg | INTRAMUSCULAR | 3 refills | Status: DC | PRN

## 2020-07-14 NOTE — Progress Notes (Signed)
BP (!) 143/72   Pulse 87   Ht 6\' 1"  (1.854 m)   Wt (!) 319 lb (144.7 kg)   SpO2 98%   BMI 42.09 kg/m    Subjective:   Patient ID: Arthur Foster, male    DOB: [birth year], 70 y.o.   MRN: [id number]  HPI: Arthur Foster is a 70 y.o. male presenting on 07/14/2020 for Allergies (When around cats has facial swelling and body itching)   HPI Patient gets or born allergies when he goes to his sister's house because she has cats in his face will swell up and get really bad allergies and is antihistamine and Benadryl was not enough.  He wonders if there is something that can help a little bit more with it.  He is about to go to his sister's house for the holidays.  He currently does not have any allergy symptoms.  Relevant past medical, surgical, family and social history reviewed and updated as indicated. Interim medical history since our last visit reviewed. Allergies and medications reviewed and updated.  Review of Systems  Constitutional: Negative for chills and fever.  Respiratory: Negative for shortness of breath and wheezing.   Cardiovascular: Negative for chest pain and leg swelling.  Musculoskeletal: Negative for back pain and gait problem.  Skin: Negative for rash.  All other systems reviewed and are negative.   Per HPI unless specifically indicated above   Allergies as of 07/14/2020      Reactions   Bee Venom    Other    Short Ragweed Pollen Ext    Ambien [zolpidem Tartrate] Other (See Comments)      Medication List       Accurate as of July 14, 2020  3:32 PM. If you have any questions, ask your nurse or doctor.        STOP taking these medications   methocarbamol 500 MG tablet Commonly known as: Robaxin Stopped by: Fransisca Kaufmann Arthur Bergen, MD   polyethylene glycol powder 17 GM/SCOOP powder Commonly known as: GLYCOLAX/MIRALAX Stopped by: Fransisca Kaufmann Arthur Daniele, MD     TAKE these medications   ALPRAZolam 0.25 MG tablet Commonly known as: XANAX TAKE 1 OR 2 TABLETS DAILY  AS NEEDED   cetirizine 10 MG tablet Commonly known as: ZYRTEC TAKE 1 TABLET DAILY   EPINEPHrine 0.3 mg/0.3 mL Soaj injection Commonly known as: EPI-PEN Inject 0.3 mg into the muscle as needed for anaphylaxis. Started by: Fransisca Kaufmann Arthur Cumpton, MD   escitalopram 20 MG tablet Commonly known as: LEXAPRO Take 1 tablet (20 mg total) by mouth daily.   ibuprofen 800 MG tablet Commonly known as: ADVIL Take 1 tablet (800 mg total) by mouth every 8 (eight) hours as needed.   lisinopril 20 MG tablet Commonly known as: ZESTRIL Take 1 tablet (20 mg total) by mouth daily.   montelukast 10 MG tablet Commonly known as: SINGULAIR Take 1 tablet (10 mg total) by mouth at bedtime. Started by: Worthy Rancher, MD   Testosterone 20.25 MG/1.25GM (1.62%) Gel Commonly known as: AndroGel Place 2 Pump onto the skin daily.   vitamin C 1000 MG tablet Take 1,000 mg by mouth daily.   Vitamin D (Ergocalciferol) 1.25 MG (50000 UNIT) Caps capsule Commonly known as: DRISDOL Take 1 capsule (50,000 Units total) by mouth once a week.        Objective:   BP (!) 143/72   Pulse 87   Ht 6\' 1"  (1.854 m)   Wt (!) 319 lb (144.7 kg)  SpO2 98%   BMI 42.09 kg/m   Wt Readings from Last 3 Encounters:  07/14/20 (!) 319 lb (144.7 kg)  06/24/20 (!) 321 lb (145.6 kg)  03/01/20 (!) 317 lb (143.8 kg)    Physical Exam Vitals and nursing note reviewed.  Constitutional:      Appearance: Normal appearance.  HENT:     Nose: Nose normal.     Mouth/Throat:     Mouth: Mucous membranes are moist.     Pharynx: No oropharyngeal exudate or posterior oropharyngeal erythema.  Neurological:     Mental Status: He is alert.       Assessment & Plan:   Problem List Items Addressed This Visit   None   Visit Diagnoses    Airborne cat allergy    -  Primary   Relevant Medications   EPINEPHrine 0.3 mg/0.3 mL IJ SOAJ injection   montelukast (SINGULAIR) 10 MG tablet      Will try adding Singulair, he will mainly  take this when he goes to his sister's house around cats.  Continue with the Zyrtec and take Benadryl as well. Follow up plan: Return if symptoms worsen or fail to improve.  Counseling provided for all of the vaccine components No orders of the defined types were placed in this encounter.   Caryl Pina, MD [facility] 07/14/2020, 3:32 PM

## 2020-07-14 NOTE — Progress Notes (Signed)
6   Subjective:  Patient ID: Arthur Foster, male    DOB: March 24, 1951,  MRN: [id number]  Chief Complaint  Patient presents with  . Routine Post Op    POST OP  Achilles Tendinitis       70 y.o. male returns for post-op check.  Date of surgery 05/03/2020.  Right Achilles detachment/reattachment with repair with resection of Haglund's deformity with gastrocnemius recession.  Patient states he does not have any pain.  He is doing well overall.  He has been weightbearing as tolerated in his regular shoes  Review of Systems: Negative except as noted in the HPI. Denies N/V/F/Ch.  Past Medical History:  Diagnosis Date  . Anxiety   . BPH (benign prostatic hyperplasia)   . Depression   . Hyperlipidemia   . Hypertension   . Testosterone deficiency     Current Outpatient Medications:  .  ALPRAZolam (XANAX) 0.25 MG tablet, TAKE 1 OR 2 TABLETS DAILY AS NEEDED, Disp: 45 tablet, Rfl: 0 .  Ascorbic Acid (VITAMIN C) 1000 MG tablet, Take 1,000 mg by mouth daily., Disp: , Rfl:  .  cetirizine (ZYRTEC) 10 MG tablet, TAKE 1 TABLET DAILY, Disp: 90 tablet, Rfl: 1 .  escitalopram (LEXAPRO) 20 MG tablet, Take 1 tablet (20 mg total) by mouth daily., Disp: 90 tablet, Rfl: 3 .  ibuprofen (ADVIL) 800 MG tablet, Take 1 tablet (800 mg total) by mouth every 8 (eight) hours as needed., Disp: 30 tablet, Rfl: 0 .  lisinopril (ZESTRIL) 20 MG tablet, Take 1 tablet (20 mg total) by mouth daily., Disp: 90 tablet, Rfl: 3 .  methocarbamol (ROBAXIN) 500 MG tablet, Take 1 tablet (500 mg total) by mouth 4 (four) times daily., Disp: 30 tablet, Rfl: 0 .  polyethylene glycol powder (GLYCOLAX/MIRALAX) 17 GM/SCOOP powder, Take 17 g by mouth 2 (two) times daily as needed., Disp: 3350 g, Rfl: 1 .  Testosterone (ANDROGEL) 20.25 MG/1.25GM (1.62%) GEL, Place 2 Pump onto the skin daily., Disp: 1.25 g, Rfl: 2 .  Vitamin D, Ergocalciferol, (DRISDOL) 1.25 MG (50000 UNIT) CAPS capsule, Take 1 capsule (50,000 Units total) by mouth once a week.,  Disp: 13 capsule, Rfl: 5  Social History   Tobacco Use  Smoking Status Former Smoker  . Types: Cigarettes  . Quit date: 38  . Years since quitting: 24.2  Smokeless Tobacco Former Systems developer  . Types: Chew  . Quit date: 2002    Allergies  Allergen Reactions  . Other   . Short Ragweed Pollen Ext   . Ambien [Zolpidem Tartrate] Other (See Comments)   Objective:  There were no vitals filed for this visit. There is no height or weight on file to calculate BMI. Constitutional Well developed. Well nourished.  Vascular Foot warm and well perfused. Capillary refill normal to all digits.   Neurologic Normal speech. Oriented to person, place, and time. Epicritic sensation to light touch grossly present bilaterally.  Dermatologic  skin completely reepithelialized.  No further dehiscence noted..  Achilles strength manual muscle testing is 5 out of 5.  The contour of the Achilles is intact.  No ruptures identified.  Orthopedic: Tenderness to palpation noted about the surgical site.   Radiographs: None Assessment:   1. Achilles tendinitis, right leg   2. Gastrocnemius equinus, right   3. Status post right foot surgery    Plan:  Patient was evaluated and treated and all questions answered.  S/p foot surgery right -Progressing as expected post-operatively. -XR: None -WB Status: Weightbearing as tolerated regular  shoes -Sutures/staples: None -Medications: None -Clinically has improved considerably.  His strength has returned back to normal.  He is able to ambulate without any restrictions.  At this time patient is officially discharged from my care.  If any foot and ankle issues happened come see me right away.  He states understanding No follow-ups on file.

## 2020-07-15 ENCOUNTER — Ambulatory Visit: Payer: Medicare Other | Admitting: Physical Therapy

## 2020-07-15 ENCOUNTER — Other Ambulatory Visit: Payer: Self-pay

## 2020-07-15 DIAGNOSIS — M25671 Stiffness of right ankle, not elsewhere classified: Secondary | ICD-10-CM

## 2020-07-15 DIAGNOSIS — M25571 Pain in right ankle and joints of right foot: Secondary | ICD-10-CM

## 2020-07-15 DIAGNOSIS — R600 Localized edema: Secondary | ICD-10-CM

## 2020-07-15 LAB — TESTOSTERONE,FREE AND TOTAL
Testosterone, Free: 4.5 pg/mL — ABNORMAL LOW (ref 6.6–18.1)
Testosterone: 247 ng/dL — ABNORMAL LOW (ref 264–916)

## 2020-07-15 NOTE — Therapy (Signed)
[facility] [place], [place], [address] Phone: [phone]   Fax:  [phone]  Patient Details  Name: Arthur Foster MRN: [id number] Date of Birth: 1951/03/06 Referring Provider:  Ivy Lynn, NP  Encounter Date: 07/15/2020  Patient to MD yesterday.  Very pleased and states he no longer needs PT.  He is cleared for ladders making sure his foot is securely on the rung.  Will hold treatment at this time and patient to call clinic should he have a flare-up.   Arthur Foster, [name] 07/15/2020, 8:58 AM  [facility] [address] [place], [place], [address] Phone: [phone]   Fax:  [phone]

## 2020-07-19 ENCOUNTER — Ambulatory Visit: Payer: Medicare Other | Admitting: Physical Therapy

## 2020-07-19 ENCOUNTER — Other Ambulatory Visit: Payer: Self-pay | Admitting: Nurse Practitioner

## 2020-07-19 DIAGNOSIS — R7989 Other specified abnormal findings of blood chemistry: Secondary | ICD-10-CM

## 2020-07-22 ENCOUNTER — Ambulatory Visit: Payer: Medicare Other | Admitting: *Deleted

## 2020-08-17 ENCOUNTER — Other Ambulatory Visit: Payer: Self-pay | Admitting: Family

## 2020-09-01 ENCOUNTER — Ambulatory Visit (INDEPENDENT_AMBULATORY_CARE_PROVIDER_SITE_OTHER): Payer: Medicare Other | Admitting: Family

## 2020-09-01 ENCOUNTER — Encounter: Payer: Self-pay | Admitting: Family

## 2020-09-01 ENCOUNTER — Other Ambulatory Visit: Payer: Self-pay

## 2020-09-01 VITALS — BP 147/84 | HR 75 | Temp 98.7°F | Ht 73.0 in | Wt 322.2 lb

## 2020-09-01 DIAGNOSIS — T24431A Corrosion of unspecified degree of right lower leg, initial encounter: Secondary | ICD-10-CM

## 2020-09-01 MED ORDER — SILVER SULFADIAZINE 1 % EX CREA: 1.0000 "application " | TOPICAL_CREAM | Freq: Every day | CUTANEOUS | 0 refills | Status: DC
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
## 2020-09-01 NOTE — Patient Instructions (Signed)
Chemical Burn, Adult A chemical burn is an injury to the skin. The structures below the skin can also be injured, such as the lungs and other internal organs. This type of burn is caused by coming into contact with a chemical that can damage and kill tissue (caustic chemical). Common caustic chemicals are found in fertilizers, household cleaners, and drain cleaners. A chemical burn can be more serious than other burns. Some chemicals continue to cause damage even after they have been removed from the skin. What are the causes? This condition is caused by swallowing, breathing in, touching, or being touched by a caustic chemical. What increases the risk? You are more likely to get a chemical burn if you work in a place where chemicals are made, stored, or used. These include working in [employer], educational, [place], farming, and mining. What are the signs or symptoms? Symptoms of this condition depend on the type of chemical that caused the burn and the way the chemical came in contact with the body. Symptoms may continue to get worse even after the chemical has been removed.  Common symptoms include: ? Color changes of the skin. Your skin may lose color (blanch), turn red, or turn darker. ? Blistered skin. ? Rash. ? Dry, flaky skin. ? A type of acne (chloracne) that results from exposure to certain chemicals. ? Burning or aching pain. ? Itching.  If the chemical is breathed in, or inhaled, symptoms include eye or nose irritation, sore throat, or coughing.  If the chemical is swallowed (ingested) or absorbed into the body through a wound, it can damage: ? Organs, including the liver, kidneys, and bladder. ? The immune system, the nervous system, or the nose, throat, windpipe, and lungs (respiratory system). Later symptoms may include scarring, shrinking of the skin, and permanent change in skin color.   How is this diagnosed? This condition is diagnosed with a medical history and physical exam.  Your health care provider will check how deep the burn is and how much of your skin surface it covers. You may be diagnosed with a:  First-degree burn, if the burn only affects the outer layer of skin (epidermis).  Second-degree burn, if the burn extends into the second layer of skin (dermis).  Third-degree burn, if the burn extends through the dermis and into deeper tissue (hypodermis). Your blood pressure, heart rate, and urine output may also be measured. If the injury is severe, you may also have:  Blood tests.  A test to check the heart's electrical activity (electrocardiogram, or ECG).  A chest X-ray. How is this treated? This condition may be treated by removing the caustic chemical. The skin will be washed or brushed to remove the chemical. Clothes will be removed if they have the chemical on them. After the chemical is removed, you may receive:  Oxygen to help you breathe.  Antibiotic medicine to fight infection.  Pain medicine.  Fluids through an IV.  Bandages (dressings).  A procedure to remove dead tissue (debridement).  A tetanus shot. Long-term burn care may include:  Breathing support. You may be given oxygen using a machine (ventilator).  Frequent wound dressing changes.  Antibiotics.  Surgery, including debridement, skin graft, or repairing of damaged tissue or structures.  Physical therapy. Follow these instructions at home: Medicines  Take and apply over-the-counter and prescription medicines only as told by your health care provider.  If you were prescribed an antibiotic medicine, take or apply it as told by your health care provider. Do  not stop using the antibiotic even if you start to feel better. Burn care  Follow instructions from your health care provider about how to take care of your burn, including: ? How to clean your burn. ? When and how you should remove or change your dressing.  Check your burn every day for signs of infection.  Check for: ? More redness, swelling, or pain. ? Fluid or blood. ? Warmth. ? Pus or a bad smell.  Keep the dressing dry until your health care provider says it can be removed.  Do not take baths, swim, use a hot tub, or do anything that would put your burn underwater until your health care provider approves. Ask your health care provider if you may take showers. You may only be allowed to take sponge baths.  Do not put ice on your burn. This can cause more damage. Activity  Rest as told by your health care provider. Do not exercise until your health care provider approves.  Do range-of-motion movements, if told by your health care provider. General instructions  Raise (elevate) the injured area above the level of your heart while you are sitting or lying down.  Do not scratch or pick at the burn.  Do not break any blisters you may have. Do not peel any skin.  Protect your burn from the sun.  Drink enough fluid to keep your urine pale yellow.  Do not put butter, oil, or other home remedies on your burn. This can lead to an infection.  Do not use any products that contain nicotine or tobacco, such as cigarettes, e-cigarettes, and chewing tobacco. These can delay healing. If you need help quitting, ask your health care provider.  Keep all follow-up visits as told by your health care provider. This is important. How is this prevented?  Avoid exposure to caustic chemicals.  Wear protective gloves and equipment when you handle caustic chemicals.  Make sure all caustic chemicals are labeled.  Talk to your employer about the caustic chemicals that they use. Ask whether those can be replaced with chemicals that are less harmful.  Make sure there is proper airflow (ventilation) in any area with caustic chemicals.  Keep your skin clean and moisturized. Dry skin is more likely to be damaged by chemicals. Contact a health care provider if:  You received a tetanus shot and you have  any of the following symptoms at the injection site: ? Swelling. ? Severe pain. ? Redness. ? Bleeding.  Your symptoms do not improve with treatment.  Your pain is not controlled with medicine.  You have more redness, swelling, or pain around your burn.  Your burn feels warm to the touch. Get help right away if you:  Develop any signs of infection such as: ? Red streaks near the burn. ? Fluid, blood, or pus coming from the burn. ? A bad smell coming from your burn.  Develop severe swelling.  Develop severe pain.  Have a fever.  Have numbness or tingling in the burned area or farther down your legs or arms.  Have trouble breathing, or you develop coughing or noisy breathing (wheezing).  Have chest pain. Summary  A chemical burn is an injury to the skin that is caused by a caustic chemical. Some chemicals continue to cause damage even after they have been removed from the skin.  This condition is more likely to develop in people who are exposed to chemicals at work.  Avoid exposure to caustic chemicals that can  cause burns. Wear protective gloves and equipment when you handle dangerous chemicals. This information is not intended to replace advice given to you by your health care provider. Make sure you discuss any questions you have with your health care provider. Document Revised: 09/03/2018 Document Reviewed: 09/03/2018 Elsevier Patient Education  [place].

## 2020-09-23 ENCOUNTER — Ambulatory Visit (INDEPENDENT_AMBULATORY_CARE_PROVIDER_SITE_OTHER): Payer: Medicare Other | Admitting: Nurse Practitioner

## 2020-09-23 ENCOUNTER — Encounter: Payer: Self-pay | Admitting: Nurse Practitioner

## 2020-09-23 ENCOUNTER — Other Ambulatory Visit: Payer: Self-pay

## 2020-09-23 ENCOUNTER — Ambulatory Visit (INDEPENDENT_AMBULATORY_CARE_PROVIDER_SITE_OTHER): Payer: Medicare Other

## 2020-09-23 VITALS — BP 138/85 | HR 75 | Temp 97.8°F | Ht 73.0 in | Wt 322.0 lb

## 2020-09-23 DIAGNOSIS — G8929 Other chronic pain: Secondary | ICD-10-CM

## 2020-09-23 DIAGNOSIS — M25562 Pain in left knee: Secondary | ICD-10-CM | POA: Diagnosis not present

## 2020-09-23 DIAGNOSIS — M1711 Unilateral primary osteoarthritis, right knee: Secondary | ICD-10-CM | POA: Diagnosis not present

## 2020-09-23 NOTE — Patient Instructions (Signed)
Acute Knee Pain, Adult °Many things can cause knee pain. Sometimes, knee pain is sudden (acute) and may be caused by damage, swelling, or irritation of the muscles and tissues that support your knee. °The pain often goes away on its own with time and rest. If the pain does not go away, tests may be done to find out what is causing the pain. °Follow these instructions at home: °If you have a knee sleeve or brace: ° °Wear the knee sleeve or brace as told by your doctor. Take it off only as told by your doctor. °Loosen it if your toes: °Tingle. °Become numb. °Turn cold and blue. °Keep it clean. °If the knee sleeve or brace is not waterproof: °Do not let it get wet. °Cover it with a watertight covering when you take a bath or shower. °Activity °Rest your knee. °Do not do things that cause pain or make pain worse. °Avoid activities where both feet leave the ground at the same time (high-impact activities). Examples are running, jumping rope, and doing jumping jacks. °Work with a physical therapist to make a safe exercise program, as told by your doctor. °Managing pain, stiffness, and swelling ° °If told, put ice on the knee. To do this: °If you have a removable knee sleeve or brace, take it off as told by your doctor. °Put ice in a plastic bag. °Place a towel between your skin and the bag. °Leave the ice on for 20 minutes, 2-3 times a day. °Take off the ice if your skin turns bright red. This is very important. If you cannot feel pain, heat, or cold, you have a greater risk of damage to the area. °If told, use an elastic bandage to put pressure (compression) on your injured knee. °Raise your knee above the level of your heart while you are sitting or lying down. °Sleep with a pillow under your knee. °General instructions °Take over-the-counter and prescription medicines only as told by your doctor. °Do not smoke or use any products that contain nicotine or tobacco. If you need help quitting, ask your doctor. °If you are  overweight, work with your doctor and a food expert (dietitian) to set goals to lose weight. Being overweight can make your knee hurt more. °Watch for any changes in your symptoms. °Keep all follow-up visits. °Contact a doctor if: °The knee pain does not stop. °The knee pain changes or gets worse. °You have a fever along with knee pain. °Your knee is red or feels warm when you touch it. °Your knee gives out or locks up. °Get help right away if: °Your knee swells, and the swelling gets worse. °You cannot move your knee. °You have very bad knee pain that does not get better with pain medicine. °Summary °Many things can cause knee pain. The pain often goes away on its own with time and rest. °Your doctor may do tests to find out the cause of the pain. °Watch for any changes in your symptoms. Relieve your pain with rest, medicines, light activity, and use of ice. °Get help right away if you cannot move your knee or your knee pain is very bad. °This information is not intended to replace advice given to you by your health care provider. Make sure you discuss any questions you have with your health care provider. °Document Revised: 09/03/2019 Document Reviewed: 09/03/2019 °Elsevier Patient Education © 2022 Else[employer] Inc. ° °

## 2020-09-23 NOTE — Assessment & Plan Note (Signed)
Chronic knee pain, symptoms not resolved.  Patient presents with swelling and tenderness.  Completed referral to orthopedic for reassessment, knee aspiration and steroid shot.  X-ray completed results pending.  Follow-up with worsening or unresolved symptoms.

## 2020-09-23 NOTE — Progress Notes (Addendum)
Acute Office Visit  Subjective:    Patient ID: Arthur Foster, male    DOB: 08-03-1950, 70 y.o.   MRN: [id number]  Chief Complaint  Patient presents with   Knee Pain   Joint Swelling    Knee Pain  There was no injury mechanism. The pain is present in the left leg. The quality of the pain is described as aching. The pain is at a severity of 3/10. The pain is mild. The pain has been Constant since onset. Pertinent negatives include no numbness or tingling. He reports no foreign bodies present. The symptoms are aggravated by movement. He has tried NSAIDs and rest for the symptoms. The treatment provided no relief.    Past Medical History:  Diagnosis Date   Anxiety    BPH (benign prostatic hyperplasia)    Depression    Hyperlipidemia    Hypertension    Testosterone deficiency     Past Surgical History:  Procedure Laterality Date   FOOT SURGERY     HERNIA REPAIR Left inguinal    Family History  Problem Relation Age of Onset   Hypertension Mother    Alzheimer's disease Mother    Hypertension Father    Alzheimer's disease Father     Social History   Socioeconomic History   Marital status: Married    Spouse name: [name]   Number of children: 3   Years of education: 12   Highest education level: High school graduate  Occupational History   Occupation: retired    Comment: works a few hours a week  Tobacco Use   Smoking status: Former    Pack years: 0.00    Types: Cigarettes    Quit date: 1998    Years since quitting: 24.4   Smokeless tobacco: Former    Types: Chew    Quit date: 2002  Vaping Use   Vaping Use: Never used  Substance and Sexual Activity   Alcohol use: Yes    Alcohol/week: 14.0 standard drinks    Types: 14 Cans of beer per week   Drug use: No   Sexual activity: Yes    Birth control/protection: None  Other Topics Concern   Not on file  Social History Narrative   Not on file   Social Determinants of Health   Financial Resource Strain: Not on  file  Food Insecurity: Not on file  Transportation Needs: Not on file  Physical Activity: Not on file  Stress: Not on file  Social Connections: Not on file  Intimate Partner Violence: Not on file    Outpatient Medications Prior to Visit  Medication Sig Dispense Refill   ALPRAZolam (XANAX) 0.25 MG tablet TAKE 1 OR 2 TABLETS DAILY AS NEEDED 45 tablet 0   Ascorbic Acid (VITAMIN C) 1000 MG tablet Take 1,000 mg by mouth daily.     cetirizine (ZYRTEC) 10 MG tablet TAKE 1 TABLET DAILY 90 tablet 1   EPINEPHrine 0.3 mg/0.3 mL IJ SOAJ injection Inject 0.3 mg into the muscle as needed for anaphylaxis. 1 each 3   escitalopram (LEXAPRO) 20 MG tablet Take 1 tablet (20 mg total) by mouth daily. 90 tablet 3   ibuprofen (ADVIL) 800 MG tablet Take 1 tablet (800 mg total) by mouth every 8 (eight) hours as needed. 30 tablet 0   lisinopril (ZESTRIL) 20 MG tablet Take 1 tablet (20 mg total) by mouth daily. 90 tablet 3   montelukast (SINGULAIR) 10 MG tablet Take 1 tablet (10 mg total) by mouth at  bedtime. 30 tablet 0   silver sulfADIAZINE (SILVADENE) 1 % cream Apply 1 application topically daily. 100 g 0   Testosterone (ANDROGEL) 20.25 MG/1.25GM (1.62%) GEL Place 2 Pump onto the skin daily. 1.25 g 2   Vitamin D, Ergocalciferol, (DRISDOL) 1.25 MG (50000 UNIT) CAPS capsule Take 1 capsule (50,000 Units total) by mouth once a week. 13 capsule 5   No facility-administered medications prior to visit.    Allergies  Allergen Reactions   Bee Venom    Other    Short Ragweed Pollen Ext    Ambien [Zolpidem Tartrate] Other (See Comments)    Review of Systems  Constitutional: Negative.   HENT: Negative.    Respiratory: Negative.    Gastrointestinal: Negative.   Genitourinary: Negative.   Musculoskeletal: Negative.   Skin:  Negative for rash.  Neurological:  Negative for tingling and numbness.  All other systems reviewed and are negative.     Objective:    Physical Exam Vitals and nursing note reviewed.   Constitutional:      Appearance: Normal appearance. He is normal weight.  HENT:     Head: Normocephalic.     Nose: Nose normal.  Eyes:     Conjunctiva/sclera: Conjunctivae normal.  Cardiovascular:     Rate and Rhythm: Normal rate and regular rhythm.  Pulmonary:     Effort: Pulmonary effort is normal.     Breath sounds: Normal breath sounds.  Abdominal:     General: Bowel sounds are normal.  Musculoskeletal:     Left knee: Swelling present. Decreased range of motion. Tenderness present.  Neurological:     Mental Status: He is alert and oriented to person, place, and time.  Psychiatric:        Behavior: Behavior normal.    BP 138/85   Pulse 75   Temp 97.8 F (36.6 C) (Temporal)   Ht 6\' 1"  (1.854 m)   Wt (!) 322 lb (146.1 kg)   SpO2 95%   BMI 42.48 kg/m  Wt Readings from Last 3 Encounters:  09/23/20 (!) 322 lb (146.1 kg)  09/01/20 (!) 322 lb 3.2 oz (146.1 kg)  07/14/20 (!) 319 lb (144.7 kg)    There are no preventive care reminders to display for this patient.  There are no preventive care reminders to display for this patient.   Lab Results  Component Value Date   TSH 1.970 05/26/2019   Lab Results  Component Value Date   WBC 6.1 05/25/2020   HGB 15.6 05/25/2020   HCT 47.0 05/25/2020   MCV 90 05/25/2020   PLT 377 05/25/2020   Lab Results  Component Value Date   NA 143 05/25/2020   K 5.2 05/25/2020   CO2 23 05/25/2020   GLUCOSE 113 (H) 05/25/2020   BUN 20 05/25/2020   CREATININE 1.12 05/25/2020   BILITOT 0.9 05/25/2020   ALKPHOS 70 05/25/2020   AST 26 05/25/2020   ALT 33 05/25/2020   PROT 7.1 05/25/2020   ALBUMIN 4.5 05/25/2020   CALCIUM 9.6 05/25/2020   Lab Results  Component Value Date   CHOL 217 (H) 05/25/2020   Lab Results  Component Value Date   HDL 47 05/25/2020   Lab Results  Component Value Date   LDLCALC 140 (H) 05/25/2020   Lab Results  Component Value Date   TRIG 168 (H) 05/25/2020   Lab Results  Component Value Date    CHOLHDL 4.6 05/25/2020   Lab Results  Component Value Date   HGBA1C 5.8 (  H) 05/25/2020       Assessment & Plan:   Problem List Items Addressed This Visit       Other   Left knee pain - Primary    Chronic knee pain, symptoms not resolved.  Patient presents with swelling and tenderness.  Completed referral to orthopedic for reassessment, knee aspiration and steroid shot.  X-ray completed results pending.  Follow-up with worsening or unresolved symptoms.       Relevant Orders   DG Knee 1-2 Views Left   Ambulatory referral to Orthopedic Surgery     No orders of the defined types were placed in this encounter.    Ivy Lynn, NP

## 2020-09-24 ENCOUNTER — Telehealth: Payer: Self-pay | Admitting: Nurse Practitioner

## 2020-09-24 NOTE — Telephone Encounter (Signed)
Called pt and LM about xray results. Pt saw on MyChart. Told pt to call for any questions.

## 2020-09-24 NOTE — Telephone Encounter (Signed)
Please review patients xray and call patient with result ASAP.  [phone]

## 2020-09-29 ENCOUNTER — Other Ambulatory Visit: Payer: Self-pay | Admitting: Family

## 2020-09-29 ENCOUNTER — Other Ambulatory Visit: Payer: Self-pay | Admitting: *Deleted

## 2020-09-29 DIAGNOSIS — R7989 Other specified abnormal findings of blood chemistry: Secondary | ICD-10-CM

## 2020-09-29 DIAGNOSIS — M25562 Pain in left knee: Secondary | ICD-10-CM | POA: Diagnosis not present

## 2020-10-01 MED ORDER — TESTOSTERONE 20.25 MG/1.25GM (1.62%) TD GEL: 2.0000 | Freq: Every day | TRANSDERMAL | 2 refills | Status: DC

## 2020-10-05 ENCOUNTER — Other Ambulatory Visit: Payer: Self-pay | Admitting: Family

## 2020-10-05 DIAGNOSIS — F339 Major depressive disorder, recurrent, unspecified: Secondary | ICD-10-CM

## 2020-10-05 DIAGNOSIS — F411 Generalized anxiety disorder: Secondary | ICD-10-CM

## 2020-10-08 ENCOUNTER — Ambulatory Visit (INDEPENDENT_AMBULATORY_CARE_PROVIDER_SITE_OTHER): Payer: Medicare Other | Admitting: Nurse Practitioner

## 2020-10-08 ENCOUNTER — Encounter: Payer: Self-pay | Admitting: Nurse Practitioner

## 2020-10-08 ENCOUNTER — Other Ambulatory Visit: Payer: Self-pay

## 2020-10-08 VITALS — BP 133/82 | HR 72 | Temp 97.6°F | Ht 73.0 in | Wt 315.0 lb

## 2020-10-08 DIAGNOSIS — Z79899 Other long term (current) drug therapy: Secondary | ICD-10-CM

## 2020-10-08 DIAGNOSIS — H903 Sensorineural hearing loss, bilateral: Secondary | ICD-10-CM

## 2020-10-08 DIAGNOSIS — K2100 Gastro-esophageal reflux disease with esophagitis, without bleeding: Secondary | ICD-10-CM

## 2020-10-08 DIAGNOSIS — F339 Major depressive disorder, recurrent, unspecified: Secondary | ICD-10-CM | POA: Diagnosis not present

## 2020-10-08 DIAGNOSIS — F411 Generalized anxiety disorder: Secondary | ICD-10-CM

## 2020-10-08 DIAGNOSIS — E782 Mixed hyperlipidemia: Secondary | ICD-10-CM | POA: Diagnosis not present

## 2020-10-08 DIAGNOSIS — I10 Essential (primary) hypertension: Secondary | ICD-10-CM | POA: Diagnosis not present

## 2020-10-08 MED ORDER — ESCITALOPRAM OXALATE 20 MG PO TABS: 20.0000 mg | ORAL_TABLET | Freq: Every day | ORAL | 3 refills | Status: DC

## 2020-10-08 MED ORDER — ALPRAZOLAM 0.25 MG PO TABS
ORAL_TABLET | ORAL | 0 refills | Status: DC
Start: 2020-10-08 — End: 2020-11-23

## 2020-10-08 MED ORDER — LISINOPRIL 20 MG PO TABS: 20.0000 mg | ORAL_TABLET | Freq: Every day | ORAL | 3 refills | Status: DC

## 2020-10-08 NOTE — Progress Notes (Signed)
Established Patient Office Visit  Subjective:  Patient ID: Arthur Foster, male    DOB: 1950-12-24  Age: 70 y.o. MRN: [id number]  CC:  Chief Complaint  Patient presents with   Medical Management of Chronic Issues    HPI Arthur Foster  presents for follow up of hypertension. Patient was diagnosed in 07/03/2012. The patient is tolerating the medication well without side effects. Compliance with treatment has been good; including taking medication as directed , maintains a healthy diet and regular exercise regimen , and following up as directed.   GERD, Follow up:  The patient was last seen for GERD 7 years ago. Changes made since that visit include no changes.  He reports good compliance with treatment. He is not having side effects. [name]  He IS experiencing  no new signs or symptoms. [name] He is NOT experiencing abdominal bloating, belching, belching and eructation, choking on food, cough, or deep pressure at base of neck  Anxiety: Patient complains of anxiety disorder.  He has the following symptoms: insomnia. Onset of symptoms was approximately 5 years ago, gradually improving since that time. He denies current suicidal and homicidal ideation. Family history significant for no psychiatric illness.Possible organic causes contributing are: none. Risk factors: none Previous treatment includes Xanax and .  He complains of the following side effects from the treatment: none.    Mixed hyperlipidemia  Pt presents with hyperlipidemia. Patient was diagnosed in 08/08/2017 compliance with treatment has been good the patient is compliant with medications, maintains a low cholesterol diet , follows up as directed , and maintains an exercise regimen . The patient denies experiencing any hypercholesterolemia related symptoms.      Past Medical History:  Diagnosis Date   Anxiety    BPH (benign prostatic hyperplasia)    Depression    Hyperlipidemia    Hypertension    Testosterone deficiency     Past  Surgical History:  Procedure Laterality Date   FOOT SURGERY     HERNIA REPAIR Left inguinal    Family History  Problem Relation Age of Onset   Hypertension Mother    Alzheimer's disease Mother    Hypertension Father    Alzheimer's disease Father     Social History   Socioeconomic History   Marital status: Married    Spouse name: [name]   Number of children: 3   Years of education: 12   Highest education level: High school graduate  Occupational History   Occupation: retired    Comment: works a few hours a week  Tobacco Use   Smoking status: Former    Pack years: 0.00    Types: Cigarettes    Quit date: 1998    Years since quitting: 24.5   Smokeless tobacco: Former    Types: Chew    Quit date: 2002  Vaping Use   Vaping Use: Never used  Substance and Sexual Activity   Alcohol use: Yes    Alcohol/week: 14.0 standard drinks    Types: 14 Cans of beer per week   Drug use: No   Sexual activity: Yes    Birth control/protection: None  Other Topics Concern   Not on file  Social History Narrative   Not on file   Social Determinants of Health   Financial Resource Strain: Not on file  Food Insecurity: Not on file  Transportation Needs: Not on file  Physical Activity: Not on file  Stress: Not on file  Social Connections: Not on file  Intimate Partner Violence:  Not on file    Outpatient Medications Prior to Visit  Medication Sig Dispense Refill   Ascorbic Acid (VITAMIN C) 1000 MG tablet Take 1,000 mg by mouth daily.     cetirizine (ZYRTEC) 10 MG tablet TAKE 1 TABLET DAILY 90 tablet 1   EPINEPHrine 0.3 mg/0.3 mL IJ SOAJ injection Inject 0.3 mg into the muscle as needed for anaphylaxis. 1 each 3   montelukast (SINGULAIR) 10 MG tablet Take 1 tablet (10 mg total) by mouth at bedtime. 30 tablet 0   silver sulfADIAZINE (SILVADENE) 1 % cream Apply 1 application topically daily. 100 g 0   Testosterone (ANDROGEL) 20.25 MG/1.25GM (1.62%) GEL Place 2 Pump onto the skin daily.  1.25 g 2   Vitamin D, Ergocalciferol, (DRISDOL) 1.25 MG (50000 UNIT) CAPS capsule Take 1 capsule (50,000 Units total) by mouth once a week. 13 capsule 5   ALPRAZolam (XANAX) 0.25 MG tablet TAKE 1 OR 2 TABLETS DAILY AS NEEDED 45 tablet 0   escitalopram (LEXAPRO) 20 MG tablet Take 1 tablet (20 mg total) by mouth daily. 90 tablet 3   ibuprofen (ADVIL) 800 MG tablet Take 1 tablet (800 mg total) by mouth every 8 (eight) hours as needed. 30 tablet 0   lisinopril (ZESTRIL) 20 MG tablet Take 1 tablet (20 mg total) by mouth daily. 90 tablet 3   No facility-administered medications prior to visit.    Allergies  Allergen Reactions   Bee Venom    Other    Short Ragweed Pollen Ext    Ambien [Zolpidem Tartrate] Other (See Comments)    ROS Review of Systems  Constitutional: Negative.   HENT: Negative.    Respiratory: Negative.    Gastrointestinal: Negative.   Musculoskeletal: Negative.   Skin: Negative.  Negative for rash.  Psychiatric/Behavioral: Negative.    All other systems reviewed and are negative.    Objective:    Physical Exam Vitals and nursing note reviewed.  Constitutional:      Appearance: Normal appearance. He is obese.  HENT:     Head: Normocephalic.  Eyes:     Conjunctiva/sclera: Conjunctivae normal.  Cardiovascular:     Rate and Rhythm: Normal rate and regular rhythm.     Pulses: Normal pulses.     Heart sounds: Normal heart sounds.  Pulmonary:     Effort: Pulmonary effort is normal.     Breath sounds: Normal breath sounds.  Abdominal:     General: Bowel sounds are normal.  Neurological:     Mental Status: He is alert and oriented to person, place, and time.  Psychiatric:        Behavior: Behavior normal. Behavior is cooperative.    BP 133/82   Pulse 72   Temp 97.6 F (36.4 C) (Temporal)   Ht 6\' 1"  (1.854 m)   Wt (!) 315 lb (142.9 kg)   SpO2 97%   BMI 41.56 kg/m  Wt Readings from Last 3 Encounters:  10/08/20 (!) 315 lb (142.9 kg)  09/23/20 (!) 322 lb  (146.1 kg)  09/01/20 (!) 322 lb 3.2 oz (146.1 kg)     There are no preventive care reminders to display for this patient.  There are no preventive care reminders to display for this patient.  Lab Results  Component Value Date   TSH 1.970 05/26/2019   Lab Results  Component Value Date   WBC 6.1 05/25/2020   HGB 15.6 05/25/2020   HCT 47.0 05/25/2020   MCV 90 05/25/2020   PLT 377 05/25/2020  Lab Results  Component Value Date   NA 143 05/25/2020   K 5.2 05/25/2020   CO2 23 05/25/2020   GLUCOSE 113 (H) 05/25/2020   BUN 20 05/25/2020   CREATININE 1.12 05/25/2020   BILITOT 0.9 05/25/2020   ALKPHOS 70 05/25/2020   AST 26 05/25/2020   ALT 33 05/25/2020   PROT 7.1 05/25/2020   ALBUMIN 4.5 05/25/2020   CALCIUM 9.6 05/25/2020   Lab Results  Component Value Date   CHOL 217 (H) 05/25/2020   Lab Results  Component Value Date   HDL 47 05/25/2020   Lab Results  Component Value Date   LDLCALC 140 (H) 05/25/2020   Lab Results  Component Value Date   TRIG 168 (H) 05/25/2020   Lab Results  Component Value Date   CHOLHDL 4.6 05/25/2020   Lab Results  Component Value Date   HGBA1C 5.8 (H) 05/25/2020      Assessment & Plan:   Problem List Items Addressed This Visit       Cardiovascular and Mediastinum   Essential hypertension, benign - Primary    Symptoms well controlled on current medication lisinopril 20 mg tablet by mouth daily.  Continue low-sodium diet, weight loss and exercise.  Completed lab CBC, CMP and lipid.  Education provided to patient printed handouts given.  Follow-up in 3 months.       Relevant Medications   lisinopril (ZESTRIL) 20 MG tablet   Other Relevant Orders   CBC with Differential   Comprehensive metabolic panel     Digestive   GERD (gastroesophageal reflux disease)     Nervous and Auditory   Sensorineural hearing loss (SNHL), bilateral    No new symptoms of GERD, symptoms well controlled.         Other   Depression,  recurrent ([place])    No new symptoms or worsening symptoms of depression continue Lexapro 20 mg tablet by mouth daily.       Relevant Medications   ALPRAZolam (XANAX) 0.25 MG tablet   escitalopram (LEXAPRO) 20 MG tablet   Generalized anxiety disorder    Anxiety well controlled on 0.25 mg tablet of Xanax by mouth 1 to 2 tablets daily as needed.  Continue generalized anxiety management, take medication as prescribed, follow-up with worsening or unresolved symptoms.  Completed GAD-7, Rx refill sent to pharmacy.       Relevant Medications   ALPRAZolam (XANAX) 0.25 MG tablet   escitalopram (LEXAPRO) 20 MG tablet   Mixed dyslipidemia    Completed labs-lipid panel.  No new symptoms of hyper or hypolipidemia.  Continue low-cholesterol diet exercise and weight loss.  Patient reports losing a total of 19 pounds from changing diet in the last few weeks.       Relevant Orders   Lipid Panel   Controlled substance agreement signed   Relevant Orders   ToxASSURE Select 13 (MW), Urine    Meds ordered this encounter  Medications   ALPRAZolam (XANAX) 0.25 MG tablet    Sig: Take 1 or 2 tablet daily as needed    Dispense:  45 tablet    Refill:  0    Order Specific Question:   Supervising Provider    Answer:   Janora Norlander [[id number]]   escitalopram (LEXAPRO) 20 MG tablet    Sig: Take 1 tablet (20 mg total) by mouth daily.    Dispense:  90 tablet    Refill:  3    Order Specific Question:   Supervising Provider  Answer:   Janora Norlander [[id number]]   lisinopril (ZESTRIL) 20 MG tablet    Sig: Take 1 tablet (20 mg total) by mouth daily.    Dispense:  90 tablet    Refill:  3    Order Specific Question:   Supervising Provider    Answer:   Janora Norlander [[id number]]    Follow-up: Return in about 3 months (around 01/08/2021).    Ivy Lynn, NP

## 2020-10-08 NOTE — Assessment & Plan Note (Signed)
No new symptoms of GERD, symptoms well controlled.

## 2020-10-08 NOTE — Assessment & Plan Note (Signed)
Symptoms well controlled on current medication lisinopril 20 mg tablet by mouth daily.  Continue low-sodium diet, weight loss and exercise.  Completed lab CBC, CMP and lipid.  Education provided to patient printed handouts given.  Follow-up in 3 months.

## 2020-10-08 NOTE — Patient Instructions (Signed)
Cholesterol Content in Foods Cholesterol is a waxy, fat-like substance that helps to carry fat in the blood. The body needs cholesterol in small amounts, but too much cholesterol can causedamage to the arteries and heart. Most people should eat less than 200 milligrams (mg) of cholesterol a day. Foods with cholesterol  Cholesterol is found in animal-based foods, such as meat, seafood, and dairy. Generally, low-fat dairy and lean meats have less cholesterol than full-fat dairy and fatty meats. The milligrams of cholesterol per serving (mg per serving) of common cholesterol-containing foods are listed below. Meat and other proteins Egg -- one large whole egg has 186 mg. Veal shank -- 4 oz has 141 mg. Lean ground turkey (93% lean) -- 4 oz has 118 mg. Fat-trimmed lamb loin -- 4 oz has 106 mg. Lean ground beef (90% lean) -- 4 oz has 100 mg. Lobster -- 3.5 oz has 90 mg. Pork loin chops -- 4 oz has 86 mg. Canned salmon -- 3.5 oz has 83 mg. Fat-trimmed beef top loin -- 4 oz has 78 mg. Frankfurter -- 1 frank (3.5 oz) has 77 mg. Crab -- 3.5 oz has 71 mg. Roasted chicken without skin, white meat -- 4 oz has 66 mg. Light bologna -- 2 oz has 45 mg. Deli-cut turkey -- 2 oz has 31 mg. Canned tuna -- 3.5 oz has 31 mg. Bacon -- 1 oz has 29 mg. Oysters and mussels (raw) -- 3.5 oz has 25 mg. Mackerel -- 1 oz has 22 mg. Trout -- 1 oz has 20 mg. Pork sausage -- 1 link (1 oz) has 17 mg. Salmon -- 1 oz has 16 mg. Tilapia -- 1 oz has 14 mg. Dairy Soft-serve ice cream --  cup (4 oz) has 103 mg. Whole-milk yogurt -- 1 cup (8 oz) has 29 mg. Cheddar cheese -- 1 oz has 28 mg. American cheese -- 1 oz has 28 mg. Whole milk -- 1 cup (8 oz) has 23 mg. 2% milk -- 1 cup (8 oz) has 18 mg. Cream cheese -- 1 tablespoon (Tbsp) has 15 mg. Cottage cheese --  cup (4 oz) has 14 mg. Low-fat (1%) milk -- 1 cup (8 oz) has 10 mg. Sour cream -- 1 Tbsp has 8.5 mg. Low-fat yogurt -- 1 cup (8 oz) has 8 mg. Nonfat Greek  yogurt -- 1 cup (8 oz) has 7 mg. Half-and-half cream -- 1 Tbsp has 5 mg. Fats and oils Cod liver oil -- 1 tablespoon (Tbsp) has 82 mg. Butter -- 1 Tbsp has 15 mg. Lard -- 1 Tbsp has 14 mg. Bacon grease -- 1 Tbsp has 14 mg. Mayonnaise -- 1 Tbsp has 5-10 mg. Margarine -- 1 Tbsp has 3-10 mg. Exact amounts of cholesterol in these foods may vary depending on specificingredients and brands. Foods without cholesterol Most plant-based foods do not have cholesterol unless you combine them with a food that has cholesterol. Foods without cholesterol include: Grains and cereals. Vegetables. Fruits. Vegetable oils, such as olive, canola, and sunflower oil. Legumes, such as peas, beans, and lentils. Nuts and seeds. Egg whites. Summary The body needs cholesterol in small amounts, but too much cholesterol can cause damage to the arteries and heart. Most people should eat less than 200 milligrams (mg) of cholesterol a day. This information is not intended to replace advice given to you by your health care provider. Make sure you discuss any questions you have with your healthcare provider. Document Revised: 07/01/2019 Document Reviewed: 08/11/2019 Elsevier Patient Education  2022   [place]. Hypertension, Adult Hypertension is another name for high blood pressure. High blood pressure forces your heart to work harder to pump blood. This can cause problems overtime. There are two numbers in a blood pressure reading. There is a top number (systolic) over a bottom number (diastolic). It is best to have a blood pressure that is below 120/80. Healthy choicescan help lower your blood pressure, or you may need medicine to help lower it. What are the causes? The cause of this condition is not known. Some conditions may be related tohigh blood pressure. What increases the risk? Smoking. Having type 2 diabetes mellitus, high cholesterol, or both. Not getting enough exercise or physical activity. Being  overweight. Having too much fat, sugar, calories, or salt (sodium) in your diet. Drinking too much alcohol. Having long-term (chronic) kidney disease. Having a family history of high blood pressure. Age. Risk increases with age. Race. You may be at higher risk if you are African American. Gender. Men are at higher risk than women before age 19. After age 9, women are at higher risk than men. Having obstructive sleep apnea. Stress. What are the signs or symptoms? High blood pressure may not cause symptoms. Very high blood pressure (hypertensive crisis) may cause: Headache. Feelings of worry or nervousness (anxiety). Shortness of breath. Nosebleed. A feeling of being sick to your stomach (nausea). Throwing up (vomiting). Changes in how you see. Very bad chest pain. Seizures. How is this treated? This condition is treated by making healthy lifestyle changes, such as: Eating healthy foods. Exercising more. Drinking less alcohol. Your health care provider may prescribe medicine if lifestyle changes are not enough to get your blood pressure under control, and if: Your top number is above 130. Your bottom number is above 80. Your personal target blood pressure may vary. Follow these instructions at home: Eating and drinking  If told, follow the DASH eating plan. To follow this plan: Fill one half of your plate at each meal with fruits and vegetables. Fill one fourth of your plate at each meal with whole grains. Whole grains include whole-wheat pasta, brown rice, and whole-grain bread. Eat or drink low-fat dairy products, such as skim milk or low-fat yogurt. Fill one fourth of your plate at each meal with low-fat (lean) proteins. Low-fat proteins include fish, chicken without skin, eggs, beans, and tofu. Avoid fatty meat, cured and processed meat, or chicken with skin. Avoid pre-made or processed food. Eat less than 1,500 mg of salt each day. Do not drink alcohol if: Your doctor  tells you not to drink. You are pregnant, may be pregnant, or are planning to become pregnant. If you drink alcohol: Limit how much you use to: 0-1 drink a day for women. 0-2 drinks a day for men. Be aware of how much alcohol is in your drink. In the [place] one drink equals one 12 oz bottle of beer (355 mL), one 5 oz glass of wine (148 mL), or one 1 oz glass of hard liquor (44 mL).  Lifestyle  Work with your doctor to stay at a healthy weight or to lose weight. Ask your doctor what the best weight is for you. Get at least 30 minutes of exercise most days of the week. This may include walking, swimming, or biking. Get at least 30 minutes of exercise that strengthens your muscles (resistance exercise) at least 3 days a week. This may include lifting weights or doing Pilates. Do not use any products that contain nicotine or tobacco,  such as cigarettes, e-cigarettes, and chewing tobacco. If you need help quitting, ask your doctor. Check your blood pressure at home as told by your doctor. Keep all follow-up visits as told by your doctor. This is important.  Medicines Take over-the-counter and prescription medicines only as told by your doctor. Follow directions carefully. Do not skip doses of blood pressure medicine. The medicine does not work as well if you skip doses. Skipping doses also puts you at risk for problems. Ask your doctor about side effects or reactions to medicines that you should watch for. Contact a doctor if you: Think you are having a reaction to the medicine you are taking. Have headaches that keep coming back (recurring). Feel dizzy. Have swelling in your ankles. Have trouble with your vision. Get help right away if you: Get a very bad headache. Start to feel mixed up (confused). Feel weak or numb. Feel faint. Have very bad pain in your: Chest. Belly (abdomen). Throw up more than once. Have trouble breathing. Summary Hypertension is another name for high blood  pressure. High blood pressure forces your heart to work harder to pump blood. For most people, a normal blood pressure is less than 120/80. Making healthy choices can help lower blood pressure. If your blood pressure does not get lower with healthy choices, you may need to take medicine. This information is not intended to replace advice given to you by your health care provider. Make sure you discuss any questions you have with your healthcare provider. Document Revised: 11/28/2017 Document Reviewed: 11/28/2017 Elsevier Patient Education  [place].

## 2020-10-08 NOTE — Assessment & Plan Note (Addendum)
Anxiety well controlled on 0.25 mg tablet of Xanax by mouth 1 to 2 tablets daily as needed.  Continue generalized anxiety management, take medication as prescribed, follow-up with worsening or unresolved symptoms.  Completed GAD-7, Rx refill sent to pharmacy.

## 2020-10-08 NOTE — Assessment & Plan Note (Signed)
Completed labs-lipid panel.  No new symptoms of hyper or hypolipidemia.  Continue low-cholesterol diet exercise and weight loss.  Patient reports losing a total of 19 pounds from changing diet in the last few weeks.

## 2020-10-08 NOTE — Assessment & Plan Note (Signed)
No new symptoms or worsening symptoms of depression continue Lexapro 20 mg tablet by mouth daily.

## 2020-10-11 ENCOUNTER — Other Ambulatory Visit: Payer: Medicare Other

## 2020-10-11 ENCOUNTER — Other Ambulatory Visit: Payer: Self-pay

## 2020-10-11 DIAGNOSIS — E782 Mixed hyperlipidemia: Secondary | ICD-10-CM | POA: Diagnosis not present

## 2020-10-11 DIAGNOSIS — I10 Essential (primary) hypertension: Secondary | ICD-10-CM

## 2020-10-12 LAB — CBC WITH DIFFERENTIAL/PLATELET
Basophils Absolute: 0.1 10*3/uL (ref 0.0–0.2)
Basos: 1 %
EOS (ABSOLUTE): 0.2 10*3/uL (ref 0.0–0.4)
Eos: 2 %
Hematocrit: 48.8 % (ref 37.5–51.0)
Hemoglobin: 16.3 g/dL (ref 13.0–17.7)
Immature Grans (Abs): 0.1 10*3/uL (ref 0.0–0.1)
Immature Granulocytes: 1 %
Lymphocytes Absolute: 2.3 10*3/uL (ref 0.7–3.1)
Lymphs: 26 %
MCH: 29.7 pg (ref 26.6–33.0)
MCHC: 33.4 g/dL (ref 31.5–35.7)
MCV: 89 fL (ref 79–97)
Monocytes Absolute: 0.6 10*3/uL (ref 0.1–0.9)
Monocytes: 6 %
Neutrophils Absolute: 5.6 10*3/uL (ref 1.4–7.0)
Neutrophils: 64 %
Platelets: 360 10*3/uL (ref 150–450)
RBC: 5.49 x10E6/uL (ref 4.14–5.80)
RDW: 12.3 % (ref 11.6–15.4)
WBC: 8.7 10*3/uL (ref 3.4–10.8)

## 2020-10-12 LAB — COMPREHENSIVE METABOLIC PANEL
ALT: 27 IU/L (ref 0–44)
AST: 24 IU/L (ref 0–40)
Albumin/Globulin Ratio: 1.8 (ref 1.2–2.2)
Albumin: 4.4 g/dL (ref 3.8–4.8)
Alkaline Phosphatase: 69 IU/L (ref 44–121)
BUN/Creatinine Ratio: 13 (ref 10–24)
BUN: 14 mg/dL (ref 8–27)
Bilirubin Total: 0.8 mg/dL (ref 0.0–1.2)
CO2: 20 mmol/L (ref 20–29)
Calcium: 9.9 mg/dL (ref 8.6–10.2)
Chloride: 103 mmol/L (ref 96–106)
Creatinine, Ser: 1.04 mg/dL (ref 0.76–1.27)
Globulin, Total: 2.4 g/dL (ref 1.5–4.5)
Glucose: 121 mg/dL — ABNORMAL HIGH (ref 65–99)
Potassium: 5.2 mmol/L (ref 3.5–5.2)
Sodium: 142 mmol/L (ref 134–144)
Total Protein: 6.8 g/dL (ref 6.0–8.5)
eGFR: 79 mL/min/{1.73_m2} (ref >59)

## 2020-10-12 LAB — LIPID PANEL
Chol/HDL Ratio: 4.9 ratio (ref 0.0–5.0)
Cholesterol, Total: 206 mg/dL — ABNORMAL HIGH (ref 100–199)
HDL: 42 mg/dL (ref >39)
LDL Chol Calc (NIH): 134 mg/dL — ABNORMAL HIGH (ref 0–99)
Triglycerides: 170 mg/dL — ABNORMAL HIGH (ref 0–149)
VLDL Cholesterol Cal: 30 mg/dL (ref 5–40)

## 2020-10-14 LAB — TOXASSURE SELECT 13 (MW), URINE

## 2020-11-19 ENCOUNTER — Ambulatory Visit: Payer: Medicare Other | Admitting: Nurse Practitioner

## 2020-11-23 ENCOUNTER — Other Ambulatory Visit: Payer: Self-pay | Admitting: Family Medicine

## 2020-11-23 DIAGNOSIS — F411 Generalized anxiety disorder: Secondary | ICD-10-CM

## 2020-11-23 DIAGNOSIS — F339 Major depressive disorder, recurrent, unspecified: Secondary | ICD-10-CM

## 2020-11-23 NOTE — Addendum Note (Signed)
Addended by: Antonietta Barcelona D on: 11/23/2020 04:22 PM   Modules accepted: Orders

## 2020-11-29 MED ORDER — ALPRAZOLAM 0.25 MG PO TABS: ORAL_TABLET | ORAL | 1 refills | Status: DC

## 2020-11-29 NOTE — Telephone Encounter (Signed)
Pt aware refill has been sent to pharmacy

## 2020-11-29 NOTE — Addendum Note (Signed)
Addended by: Zannie Cove on: 11/29/2020 09:31 AM   Modules accepted: Orders

## 2020-11-29 NOTE — Telephone Encounter (Signed)
Je, can this be refilled this time. He was just here for this on 7/8 and only 1 fill sent in (should be 3?? Was told to f/u in 3 month)   Also - signed contract 10/2020 as well.

## 2020-12-15 ENCOUNTER — Telehealth: Payer: Self-pay | Admitting: *Deleted

## 2020-12-15 NOTE — Telephone Encounter (Signed)
They called this morning saying that we would need to schedule him an appt to come in to do the EKG for his surgical clearance. They don't want to schedule his surgery without him having a normal EKG first if you feel he needs one prior to surgery.

## 2020-12-15 NOTE — Telephone Encounter (Signed)
Pt has put off surgery until first of year. No EKG needed.

## 2021-01-11 ENCOUNTER — Other Ambulatory Visit: Payer: Self-pay

## 2021-01-11 ENCOUNTER — Ambulatory Visit (INDEPENDENT_AMBULATORY_CARE_PROVIDER_SITE_OTHER): Payer: Medicare Other | Admitting: Nurse Practitioner

## 2021-01-11 ENCOUNTER — Encounter: Payer: Self-pay | Admitting: Nurse Practitioner

## 2021-01-11 VITALS — BP 136/82 | Temp 97.2°F | Ht 73.0 in | Wt 314.0 lb

## 2021-01-11 DIAGNOSIS — Z01818 Encounter for other preprocedural examination: Secondary | ICD-10-CM | POA: Insufficient documentation

## 2021-01-11 DIAGNOSIS — Z23 Encounter for immunization: Secondary | ICD-10-CM | POA: Diagnosis not present

## 2021-01-11 DIAGNOSIS — E782 Mixed hyperlipidemia: Secondary | ICD-10-CM

## 2021-01-11 DIAGNOSIS — K219 Gastro-esophageal reflux disease without esophagitis: Secondary | ICD-10-CM | POA: Diagnosis not present

## 2021-01-11 DIAGNOSIS — I10 Essential (primary) hypertension: Secondary | ICD-10-CM | POA: Diagnosis not present

## 2021-01-11 LAB — CBC WITH DIFFERENTIAL/PLATELET
Basophils Absolute: 0.1 10*3/uL (ref 0.0–0.2)
Basos: 1 %
EOS (ABSOLUTE): 0.3 10*3/uL (ref 0.0–0.4)
Eos: 4 %
Hematocrit: 48.2 % (ref 37.5–51.0)
Hemoglobin: 16.1 g/dL (ref 13.0–17.7)
Immature Grans (Abs): 0.1 10*3/uL (ref 0.0–0.1)
Immature Granulocytes: 1 %
Lymphocytes Absolute: 1.9 10*3/uL (ref 0.7–3.1)
Lymphs: 28 %
MCH: 29.7 pg (ref 26.6–33.0)
MCHC: 33.4 g/dL (ref 31.5–35.7)
MCV: 89 fL (ref 79–97)
Monocytes Absolute: 0.5 10*3/uL (ref 0.1–0.9)
Monocytes: 7 %
Neutrophils Absolute: 4.1 10*3/uL (ref 1.4–7.0)
Neutrophils: 59 %
Platelets: 371 10*3/uL (ref 150–450)
RBC: 5.43 x10E6/uL (ref 4.14–5.80)
RDW: 12.7 % (ref 11.6–15.4)
WBC: 6.8 10*3/uL (ref 3.4–10.8)

## 2021-01-11 NOTE — Assessment & Plan Note (Signed)
Working with patient to consider [facility], dietitian and for now cutting back on all high calorie foods, cookies, bear,  Steak and potatoes, increase fresh fruits and vegetables, grilled , broil and boil foods and tolerated, follow up in 3 months to reevaluate weigh loss.

## 2021-01-11 NOTE — Assessment & Plan Note (Addendum)
preop exam completed with EKG, results faxed.  patient is unable to have surgery until he is 300 LB or less. patient is 314 lb and will work on loosing 14 lb by January.

## 2021-01-11 NOTE — Assessment & Plan Note (Signed)
Blood pressure well controlled. No changes to current medication, continue low sodium diet and weight loss. Follow up in 3 months, education provided to patient with printed hand out given

## 2021-01-11 NOTE — Assessment & Plan Note (Signed)
Labs completed to day for lipid panel, CBC and CMP, results pending. Continue weight loss and low cholesterol diet. No changes to medication until lab results come back no new signs and symptoms of hyperlipidemia. Education provided to patient printed hand out given.

## 2021-01-11 NOTE — Progress Notes (Signed)
Established Patient Office Visit  Subjective:  Patient ID: Arthur Foster, Arthur Foster    DOB: Aug 02, 1950  Age: 70 y.o. MRN: [id number]  CC:  Chief Complaint  Patient presents with   Medical Management of Chronic Issues    HPI Arthur Foster presents for Mixed hyperlipidemia . Patient was diagnosed in 07/03/2012. Compliance with treatment has been  The patient is compliant with medications, maintains a low cholesterol diet , follows up as directed , and maintains an exercise regimen . The patient denies experiencing any hypercholesterolemia related symptoms.    Pt presents for follow up of hypertension. Patient was diagnosed in 07/03/2012. The patient is tolerating the medication well without side effects. Compliance with treatment has been good; including taking medication as directed , maintains a healthy diet and regular exercise regimen , and following up as directed.   GERD, Follow up:  The patient was last seen for GERD 6 months ago. Changes made since that visit include none.  He reports good compliance with treatment. He is not having side effects. [name]  He IS experiencing  no new symptoms . He is NOT experiencing belching, belching and eructation, bilious reflux, chest pain, choking on food, cough, or deep pressure at base of neck  For morbid obesity, patient is working on calorie counting to reduce overall body weight and BMI.patient is unable to actively incorporate exercise ito his daily routine due to work schedule.   Past Medical History:  Diagnosis Date   Anxiety    BPH (benign prostatic hyperplasia)    Depression    Hyperlipidemia    Hypertension    Testosterone deficiency     Past Surgical History:  Procedure Laterality Date   FOOT SURGERY     HERNIA REPAIR Left inguinal    Family History  Problem Relation Age of Onset   Hypertension Mother    Alzheimer's disease Mother    Hypertension Father    Alzheimer's disease Father     Social History   Socioeconomic  History   Marital status: Married    Spouse name: [name]   Number of children: 3   Years of education: 12   Highest education level: High school graduate  Occupational History   Occupation: retired    Comment: works a few hours a week  Tobacco Use   Smoking status: Former    Types: Cigarettes    Quit date: 1998    Years since quitting: 24.7   Smokeless tobacco: Former    Types: Chew    Quit date: 2002  Vaping Use   Vaping Use: Never used  Substance and Sexual Activity   Alcohol use: Yes    Alcohol/week: 14.0 standard drinks    Types: 14 Cans of beer per week   Drug use: No   Sexual activity: Yes    Birth control/protection: None  Other Topics Concern   Not on file  Social History Narrative   Not on file   Social Determinants of Health   Financial Resource Strain: Not on file  Food Insecurity: Not on file  Transportation Needs: Not on file  Physical Activity: Not on file  Stress: Not on file  Social Connections: Not on file  Intimate Partner Violence: Not on file    Outpatient Medications Prior to Visit  Medication Sig Dispense Refill   ALPRAZolam (XANAX) 0.25 MG tablet Take 1 or 2 tablet daily as needed 45 tablet 1   cetirizine (ZYRTEC) 10 MG tablet TAKE 1 TABLET DAILY 90 tablet 1  EPINEPHrine 0.3 mg/0.3 mL IJ SOAJ injection Inject 0.3 mg into the muscle as needed for anaphylaxis. 1 each 3   escitalopram (LEXAPRO) 20 MG tablet Take 1 tablet (20 mg total) by mouth daily. 90 tablet 3   lisinopril (ZESTRIL) 20 MG tablet Take 1 tablet (20 mg total) by mouth daily. 90 tablet 3   Vitamin D, Ergocalciferol, (DRISDOL) 1.25 MG (50000 UNIT) CAPS capsule Take 1 capsule (50,000 Units total) by mouth once a week. 13 capsule 5   Testosterone (ANDROGEL) 20.25 MG/1.25GM (1.62%) GEL Place 2 Pump onto the skin daily. (Patient not taking: Reported on 01/11/2021) 1.25 g 2   Ascorbic Acid (VITAMIN C) 1000 MG tablet Take 1,000 mg by mouth daily.     montelukast (SINGULAIR) 10 MG tablet  Take 1 tablet (10 mg total) by mouth at bedtime. 30 tablet 0   silver sulfADIAZINE (SILVADENE) 1 % cream Apply 1 application topically daily. 100 g 0   No facility-administered medications prior to visit.    Allergies  Allergen Reactions   Bee Venom    Other    Short Ragweed Pollen Ext    Ambien [Zolpidem Tartrate] Other (See Comments)    ROS Review of Systems    Objective:    Physical Exam  BP 136/82   Temp (!) 97.2 F (36.2 C) (Temporal)   Ht _0  (1.854 m)   Wt (!) 314 lb (142.4 kg)   SpO2 97%   BMI 41.43 kg/m  Wt Readings from Last 3 Encounters:  01/11/21 (!) 314 lb (142.4 kg)  10/08/20 (!) 315 lb (142.9 kg)  09/23/20 (!) 322 lb (146.1 kg)     Health Maintenance Due  Topic Date Due   INFLUENZA VACCINE  11/01/2020    There are no preventive care reminders to display for this patient.  Lab Results  Component Value Date   TSH 1.970 05/26/2019   Lab Results  Component Value Date   WBC 8.7 10/11/2020   HGB 16.3 10/11/2020   HCT 48.8 10/11/2020   MCV 89 10/11/2020   PLT 360 10/11/2020   Lab Results  Component Value Date   NA 142 10/11/2020   K 5.2 10/11/2020   CO2 20 10/11/2020   GLUCOSE 121 (H) 10/11/2020   BUN 14 10/11/2020   CREATININE 1.04 10/11/2020   BILITOT 0.8 10/11/2020   ALKPHOS 69 10/11/2020   AST 24 10/11/2020   ALT 27 10/11/2020   PROT 6.8 10/11/2020   ALBUMIN 4.4 10/11/2020   CALCIUM 9.9 10/11/2020   EGFR 79 10/11/2020   Lab Results  Component Value Date   CHOL 206 (H) 10/11/2020   Lab Results  Component Value Date   HDL 42 10/11/2020   Lab Results  Component Value Date   LDLCALC 134 (H) 10/11/2020   Lab Results  Component Value Date   TRIG 170 (H) 10/11/2020   Lab Results  Component Value Date   CHOLHDL 4.9 10/11/2020   Lab Results  Component Value Date   HGBA1C 5.8 (H) 05/25/2020      Assessment & Plan:   Problem List Items Addressed This Visit       Cardiovascular and Mediastinum   Essential  hypertension, benign - Primary    Blood pressure well controlled. No changes to current medication, continue low sodium diet and weight loss. Follow up in 3 months, education provided to patient with printed hand out given      Relevant Orders   CBC with Differential/Platelet   Comprehensive metabolic  panel   Lipid Panel     Digestive   GERD (gastroesophageal reflux disease)    Continue current medication management no changes necessary. Follow up with any new symptoms.        Other   Morbid obesity ([place])    Working with patient to consider [facility], dietitian and for now cutting back on all high calorie foods, cookies, bear,  Steak and potatoes, increase fresh fruits and vegetables, grilled , broil and boil foods and tolerated, follow up in 3 months to reevaluate weigh loss.      Mixed dyslipidemia    Labs completed to day for lipid panel, CBC and CMP, results pending. Continue weight loss and low cholesterol diet. No changes to medication until lab results come back no new signs and symptoms of hyperlipidemia. Education provided to patient printed hand out given.      Preop examination    preop exam completed with EKG, results faxed.  patient is unable to have surgery until he is 300 LB or less. patient is 314 lb and will work on loosing 14 lb by January.      Relevant Orders   EKG 12-Lead (Completed)    No orders of the defined types were placed in this encounter.   Follow-up: Return in about 3 months (around 04/13/2021).    Ivy Lynn, NP

## 2021-01-11 NOTE — Assessment & Plan Note (Signed)
Continue current medication management no changes necessary. Follow up with any new symptoms.

## 2021-01-11 NOTE — Patient Instructions (Signed)
High Cholesterol High cholesterol is a condition in which the blood has high levels of a white, waxy substance similar to fat (cholesterol). The liver makes all the cholesterol that the body needs. The human body needs small amounts of cholesterol to help build cells. A person gets extra or excess cholesterol from the food that he or she eats. The blood carries cholesterol from the liver to the rest of the body. If you have high cholesterol, deposits (plaques) may build up on the walls of your arteries. Arteries are the blood vessels that carry blood away from your heart. These plaques make the arteries narrow and stiff. Cholesterol plaques increase your risk for heart attack and stroke. Work with your health care provider to keep your cholesterol levels in a healthy range. What increases the risk? The following factors may make you more likely to develop this condition: Eating foods that are high in animal fat (saturated fat) or cholesterol. Being overweight. Not getting enough exercise. A family history of high cholesterol (familial hypercholesterolemia). Use of tobacco products. Having diabetes. What are the signs or symptoms? In most cases, high cholesterol does not usually cause any symptoms. In severe cases, very high cholesterol levels can cause: Fatty bumps under the skin (xanthomas). A white or gray ring around the black center (pupil) of the eye. How is this diagnosed? This condition may be diagnosed based on the results of a blood test. If you are older than 68 years of age, your health care provider may check your cholesterol levels every 4-6 years. You may be checked more often if you have high cholesterol or other risk factors for heart disease. The blood test for cholesterol measures: "Bad" cholesterol, or LDL cholesterol. This is the main type of cholesterol that causes heart disease. The desired level is less than 100 mg/dL (2.59 mmol/L). "Good" cholesterol, or HDL  cholesterol. HDL helps protect against heart disease by cleaning the arteries and carrying the LDL to the liver for processing. The desired level for HDL is 60 mg/dL (1.55 mmol/L) or higher. Triglycerides. These are fats that your body can store or burn for energy. The desired level is less than 150 mg/dL (1.69 mmol/L). Total cholesterol. This measures the total amount of cholesterol in your blood and includes LDL, HDL, and triglycerides. The desired level is less than 200 mg/dL (5.17 mmol/L). How is this treated? Treatment for high cholesterol starts with lifestyle changes, such as diet and exercise. Diet changes. You may be asked to eat foods that have more fiber and less saturated fats or added sugar. Lifestyle changes. These may include regular exercise, maintaining a healthy weight, and quitting use of tobacco products. Medicines. These are given when diet and lifestyle changes have not worked. You may be prescribed a statin medicine to help lower your cholesterol levels. Follow these instructions at home: Eating and drinking  Eat a healthy, balanced diet. This diet includes: Daily servings of a variety of fresh, frozen, or canned fruits and vegetables. Daily servings of whole grain foods that are rich in fiber. Foods that are low in saturated fats and trans fats. These include poultry and fish without skin, lean cuts of meat, and low-fat dairy products. A variety of fish, especially oily fish that contain omega-3 fatty acids. Aim to eat fish at least 2 times a week. Avoid foods and drinks that have added sugar. Use healthy cooking methods, such as roasting, grilling, broiling, baking, poaching, steaming, and stir-frying. Do not fry your food except for stir-frying.  If you drink alcohol: Limit how much you have to: 0-1 drink a day for women who are not pregnant. 0-2 drinks a day for men. Know how much alcohol is in a drink. In the [place] one drink equals one 12 oz bottle of beer (355 mL),  one 5 oz glass of wine (148 mL), or one 1 oz glass of hard liquor (44 mL). Lifestyle  Get regular exercise. Aim to exercise for a total of 150 minutes a week. Increase your activity level by doing activities such as gardening, walking, and taking the stairs. Do not use any products that contain nicotine or tobacco. These products include cigarettes, chewing tobacco, and vaping devices, such as e-cigarettes. If you need help quitting, ask your health care provider. General instructions Take over-the-counter and prescription medicines only as told by your health care provider. Keep all follow-up visits. This is important. Where to find more information American Heart Association: www.heart.org National Heart, Lung, and Blood Institute: https://wilson-eaton.com/ Contact a health care provider if: You have trouble achieving or maintaining a healthy diet or weight. You are starting an exercise program. You are unable to stop smoking. Get help right away if: You have chest pain. You have trouble breathing. You have discomfort or pain in your jaw, neck, back, shoulder, or arm. You have any symptoms of a stroke. "BE FAST" is an easy way to remember the main warning signs of a stroke: B - Balance. Signs are dizziness, sudden trouble walking, or loss of balance. E - Eyes. Signs are trouble seeing or a sudden change in vision. F - Face. Signs are sudden weakness or numbness of the face, or the face or eyelid drooping on one side. A - Arms. Signs are weakness or numbness in an arm. This happens suddenly and usually on one side of the body. S - Speech. Signs are sudden trouble speaking, slurred speech, or trouble understanding what people say. T - Time. Time to call emergency services. Write down what time symptoms started. You have other signs of a stroke, such as: A sudden, severe headache with no known cause. Nausea or vomiting. Seizure. These symptoms may represent a serious problem that is an  emergency. Do not wait to see if the symptoms will go away. Get medical help right away. Call your local emergency services (911 in the [place] Do not drive yourself to the hospital. Summary Cholesterol plaques increase your risk for heart attack and stroke. Work with your health care provider to keep your cholesterol levels in a healthy range. Eat a healthy, balanced diet, get regular exercise, and maintain a healthy weight. Do not use any products that contain nicotine or tobacco. These products include cigarettes, chewing tobacco, and vaping devices, such as e-cigarettes. Get help right away if you have any symptoms of a stroke. This information is not intended to replace advice given to you by your health care provider. Make sure you discuss any questions you have with your health care provider. Document Revised: 06/03/2020 Document Reviewed: 05/24/2020 Elsevier Patient Education  2022 [place]. Hypertension, Adult Hypertension is another name for high blood pressure. High blood pressure forces your heart to work harder to pump blood. This can cause problems over time. There are two numbers in a blood pressure reading. There is a top number (systolic) over a bottom number (diastolic). It is best to have a blood pressure that is below 120/80. Healthy choices can help lower your blood pressure, or you may need medicine to help lower  it. What are the causes? The cause of this condition is not known. Some conditions may be related to high blood pressure. What increases the risk? Smoking. Having type 2 diabetes mellitus, high cholesterol, or both. Not getting enough exercise or physical activity. Being overweight. Having too much fat, sugar, calories, or salt (sodium) in your diet. Drinking too much alcohol. Having long-term (chronic) kidney disease. Having a family history of high blood pressure. Age. Risk increases with age. Race. You may be at higher risk if you are African  American. Gender. Men are at higher risk than women before age 80. After age 52, women are at higher risk than men. Having obstructive sleep apnea. Stress. What are the signs or symptoms? High blood pressure may not cause symptoms. Very high blood pressure (hypertensive crisis) may cause: Headache. Feelings of worry or nervousness (anxiety). Shortness of breath. Nosebleed. A feeling of being sick to your stomach (nausea). Throwing up (vomiting). Changes in how you see. Very bad chest pain. Seizures. How is this treated? This condition is treated by making healthy lifestyle changes, such as: Eating healthy foods. Exercising more. Drinking less alcohol. Your health care provider may prescribe medicine if lifestyle changes are not enough to get your blood pressure under control, and if: Your top number is above 130. Your bottom number is above 80. Your personal target blood pressure may vary. Follow these instructions at home: Eating and drinking  If told, follow the DASH eating plan. To follow this plan: Fill one half of your plate at each meal with fruits and vegetables. Fill one fourth of your plate at each meal with whole grains. Whole grains include whole-wheat pasta, brown rice, and whole-grain bread. Eat or drink low-fat dairy products, such as skim milk or low-fat yogurt. Fill one fourth of your plate at each meal with low-fat (lean) proteins. Low-fat proteins include fish, chicken without skin, eggs, beans, and tofu. Avoid fatty meat, cured and processed meat, or chicken with skin. Avoid pre-made or processed food. Eat less than 1,500 mg of salt each day. Do not drink alcohol if: Your doctor tells you not to drink. You are pregnant, may be pregnant, or are planning to become pregnant. If you drink alcohol: Limit how much you use to: 0-1 drink a day for women. 0-2 drinks a day for men. Be aware of how much alcohol is in your drink. In the [place] one drink equals one 12  oz bottle of beer (355 mL), one 5 oz glass of wine (148 mL), or one 1 oz glass of hard liquor (44 mL). Lifestyle  Work with your doctor to stay at a healthy weight or to lose weight. Ask your doctor what the best weight is for you. Get at least 30 minutes of exercise most days of the week. This may include walking, swimming, or biking. Get at least 30 minutes of exercise that strengthens your muscles (resistance exercise) at least 3 days a week. This may include lifting weights or doing Pilates. Do not use any products that contain nicotine or tobacco, such as cigarettes, e-cigarettes, and chewing tobacco. If you need help quitting, ask your doctor. Check your blood pressure at home as told by your doctor. Keep all follow-up visits as told by your doctor. This is important. Medicines Take over-the-counter and prescription medicines only as told by your doctor. Follow directions carefully. Do not skip doses of blood pressure medicine. The medicine does not work as well if you skip doses. Skipping doses also puts  you at risk for problems. Ask your doctor about side effects or reactions to medicines that you should watch for. Contact a doctor if you: Think you are having a reaction to the medicine you are taking. Have headaches that keep coming back (recurring). Feel dizzy. Have swelling in your ankles. Have trouble with your vision. Get help right away if you: Get a very bad headache. Start to feel mixed up (confused). Feel weak or numb. Feel faint. Have very bad pain in your: Chest. Belly (abdomen). Throw up more than once. Have trouble breathing. Summary Hypertension is another name for high blood pressure. High blood pressure forces your heart to work harder to pump blood. For most people, a normal blood pressure is less than 120/80. Making healthy choices can help lower blood pressure. If your blood pressure does not get lower with healthy choices, you may need to take  medicine. This information is not intended to replace advice given to you by your health care provider. Make sure you discuss any questions you have with your health care provider. Document Revised: 11/28/2017 Document Reviewed: 11/28/2017 Elsevier Patient Education  [place].

## 2021-01-12 LAB — COMPREHENSIVE METABOLIC PANEL
ALT: 27 IU/L (ref 0–44)
AST: 22 IU/L (ref 0–40)
Albumin/Globulin Ratio: 1.8 (ref 1.2–2.2)
Albumin: 4.5 g/dL (ref 3.8–4.8)
Alkaline Phosphatase: 77 IU/L (ref 44–121)
BUN/Creatinine Ratio: 15 (ref 10–24)
BUN: 18 mg/dL (ref 8–27)
Bilirubin Total: 1.1 mg/dL (ref 0.0–1.2)
CO2: 25 mmol/L (ref 20–29)
Calcium: 9.7 mg/dL (ref 8.6–10.2)
Chloride: 102 mmol/L (ref 96–106)
Creatinine, Ser: 1.18 mg/dL (ref 0.76–1.27)
Globulin, Total: 2.5 g/dL (ref 1.5–4.5)
Glucose: 116 mg/dL — ABNORMAL HIGH (ref 70–99)
Potassium: 5.3 mmol/L — ABNORMAL HIGH (ref 3.5–5.2)
Sodium: 141 mmol/L (ref 134–144)
Total Protein: 7 g/dL (ref 6.0–8.5)
eGFR: 67 mL/min/{1.73_m2} (ref >59)

## 2021-01-12 LAB — LIPID PANEL
Chol/HDL Ratio: 5.1 ratio — ABNORMAL HIGH (ref 0.0–5.0)
Cholesterol, Total: 230 mg/dL — ABNORMAL HIGH (ref 100–199)
HDL: 45 mg/dL (ref >39)
LDL Chol Calc (NIH): 153 mg/dL — ABNORMAL HIGH (ref 0–99)
Triglycerides: 174 mg/dL — ABNORMAL HIGH (ref 0–149)
VLDL Cholesterol Cal: 32 mg/dL (ref 5–40)

## 2021-01-26 ENCOUNTER — Other Ambulatory Visit: Payer: Self-pay | Admitting: Nurse Practitioner

## 2021-01-26 ENCOUNTER — Telehealth: Payer: Self-pay

## 2021-01-26 DIAGNOSIS — F339 Major depressive disorder, recurrent, unspecified: Secondary | ICD-10-CM

## 2021-01-26 DIAGNOSIS — F411 Generalized anxiety disorder: Secondary | ICD-10-CM

## 2021-01-26 NOTE — Telephone Encounter (Signed)
Sent RX request to PCP for Xanax as patient has had recent OV and med was not refilled at that time. Refills are coming due following OV as this has happened with pervious refill also. Called patient lmtcb - will schedule patient 03/31/2021 or a few days sooner as this will place his follow ups in time for his refills with his OVs.

## 2021-01-26 NOTE — Telephone Encounter (Signed)
Patient was just seen on 01/11/2021 and RX was not refilled at [facility] as it had been filled outside of an OV previously. Med was last written 11/29/2020 for #45 1 refill. Patient is due for follow up in January (3 months) but does not have appointment schedule at this moment. Called patient lmtcb - will schedule patient 03/31/2021 or a few days sooner as this will place his follow ups in time for his refills with his OVs.  Per previous note patient has contract on file this year  Please review and advise if refill approved this time since he was recent seen.

## 2021-02-03 ENCOUNTER — Telehealth: Payer: Self-pay | Admitting: Nurse Practitioner

## 2021-02-03 NOTE — Telephone Encounter (Signed)
Have you seen surgical clearance form?

## 2021-02-04 ENCOUNTER — Other Ambulatory Visit: Payer: Self-pay | Admitting: Nurse Practitioner

## 2021-02-04 DIAGNOSIS — Z01818 Encounter for other preprocedural examination: Secondary | ICD-10-CM

## 2021-02-21 DIAGNOSIS — M25561 Pain in right knee: Secondary | ICD-10-CM | POA: Diagnosis not present

## 2021-03-01 DIAGNOSIS — Z20822 Contact with and (suspected) exposure to covid-19: Secondary | ICD-10-CM | POA: Diagnosis not present

## 2021-03-02 ENCOUNTER — Ambulatory Visit (INDEPENDENT_AMBULATORY_CARE_PROVIDER_SITE_OTHER): Payer: Medicare Other

## 2021-03-02 VITALS — Ht 73.5 in | Wt 314.0 lb

## 2021-03-02 DIAGNOSIS — Z0000 Encounter for general adult medical examination without abnormal findings: Secondary | ICD-10-CM | POA: Diagnosis not present

## 2021-03-02 NOTE — Patient Instructions (Signed)
Mr. Arthur Foster , Thank you for taking time to come for your Medicare Wellness Visit. I appreciate your ongoing commitment to your health goals. Please review the following plan we discussed and let me know if I can assist you in the future.   Screening recommendations/referrals: Colonoscopy: Done 07/18/2012 Repeat in 10 years  Recommended yearly ophthalmology/optometry visit for glaucoma screening and checkup Recommended yearly dental visit for hygiene and checkup  Vaccinations: Influenza vaccine: Done 01/11/2021 Repeat annually  Pneumococcal vaccine: Done 05/26/2019 and 06/24/2020 Tdap vaccine: Done 12/03/2011 Repeat in 10 years  Shingles vaccine: Done 12/23/2018 and 02/24/2019   Covid-19: Declined  Advanced directives: Please bring a copy of your health care power of attorney and living will to the office to be added to your chart at your convenience.   Conditions/risks identified: Aim for 30 minutes of exercise or brisk walking each day, drink 6-8 glasses of water and eat lots of fruits and vegetables. KEEP UP THE GOOD WORK!!  Next appointment: Follow up in one year for your annual wellness visit. 2023.  Preventive Care 68 Years and Older, Male  Preventive care refers to lifestyle choices and visits with your health care provider that can promote health and wellness. What does preventive care include? A yearly physical exam. This is also called an annual well check. Dental exams once or twice a year. Routine eye exams. Ask your health care provider how often you should have your eyes checked. Personal lifestyle choices, including: Daily care of your teeth and gums. Regular physical activity. Eating a healthy diet. Avoiding tobacco and drug use. Limiting alcohol use. Practicing safe sex. Taking low doses of aspirin every day. Taking vitamin and mineral supplements as recommended by your health care provider. What happens during an annual well check? The services and screenings done  by your health care provider during your annual well check will depend on your age, overall health, lifestyle risk factors, and family history of disease. Counseling  Your health care provider may ask you questions about your: Alcohol use. Tobacco use. Drug use. Emotional well-being. Home and relationship well-being. Sexual activity. Eating habits. History of falls. Memory and ability to understand (cognition). Work and work Statistician. Screening  You may have the following tests or measurements: Height, weight, and BMI. Blood pressure. Lipid and cholesterol levels. These may be checked every 5 years, or more frequently if you are over 84 years old. Skin check. Lung cancer screening. You may have this screening every year starting at age 19 if you have a 30-pack-year history of smoking and currently smoke or have quit within the past 15 years. Fecal occult blood test (FOBT) of the stool. You may have this test every year starting at age 48. Flexible sigmoidoscopy or colonoscopy. You may have a sigmoidoscopy every 5 years or a colonoscopy every 10 years starting at age 81. Prostate cancer screening. Recommendations will vary depending on your family history and other risks. Hepatitis C blood test. Hepatitis B blood test. Sexually transmitted disease (STD) testing. Diabetes screening. This is done by checking your blood sugar (glucose) after you have not eaten for a while (fasting). You may have this done every 1-3 years. Abdominal aortic aneurysm (AAA) screening. You may need this if you are a current or former smoker. Osteoporosis. You may be screened starting at age 75 if you are at high risk. Talk with your health care provider about your test results, treatment options, and if necessary, the need for more tests. Vaccines  Your health  care provider may recommend certain vaccines, such as: Influenza vaccine. This is recommended every year. Tetanus, diphtheria, and acellular  pertussis (Tdap, Td) vaccine. You may need a Td booster every 10 years. Zoster vaccine. You may need this after age 34. Pneumococcal 13-valent conjugate (PCV13) vaccine. One dose is recommended after age 24. Pneumococcal polysaccharide (PPSV23) vaccine. One dose is recommended after age 22. Talk to your health care provider about which screenings and vaccines you need and how often you need them. This information is not intended to replace advice given to you by your health care provider. Make sure you discuss any questions you have with your health care provider. Document Released: 04/16/2015 Document Revised: 12/08/2015 Document Reviewed: 01/19/2015 Elsevier Interactive Patient Education  2017 [employer] Prevention in the Home Falls can cause injuries. They can happen to people of all ages. There are many things you can do to make your home safe and to help prevent falls. What can I do on the outside of my home? Regularly fix the edges of walkways and driveways and fix any cracks. Remove anything that might make you trip as you walk through a door, such as a raised step or threshold. Trim any bushes or trees on the path to your home. Use bright outdoor lighting. Clear any walking paths of anything that might make someone trip, such as rocks or tools. Regularly check to see if handrails are loose or broken. Make sure that both sides of any steps have handrails. Any raised decks and porches should have guardrails on the edges. Have any leaves, snow, or ice cleared regularly. Use sand or salt on walking paths during winter. Clean up any spills in your garage right away. This includes oil or grease spills. What can I do in the bathroom? Use night lights. Install grab bars by the toilet and in the tub and shower. Do not use towel bars as grab bars. Use non-skid mats or decals in the tub or shower. If you need to sit down in the shower, use a plastic, non-slip stool. Keep the floor  dry. Clean up any water that spills on the floor as soon as it happens. Remove soap buildup in the tub or shower regularly. Attach bath mats securely with double-sided non-slip rug tape. Do not have throw rugs and other things on the floor that can make you trip. What can I do in the bedroom? Use night lights. Make sure that you have a light by your bed that is easy to reach. Do not use any sheets or blankets that are too big for your bed. They should not hang down onto the floor. Have a firm chair that has side arms. You can use this for support while you get dressed. Do not have throw rugs and other things on the floor that can make you trip. What can I do in the kitchen? Clean up any spills right away. Avoid walking on wet floors. Keep items that you use a lot in easy-to-reach places. If you need to reach something above you, use a strong step stool that has a grab bar. Keep electrical cords out of the way. Do not use floor polish or wax that makes floors slippery. If you must use wax, use non-skid floor wax. Do not have throw rugs and other things on the floor that can make you trip. What can I do with my stairs? Do not leave any items on the stairs. Make sure that there are handrails on  both sides of the stairs and use them. Fix handrails that are broken or loose. Make sure that handrails are as long as the stairways. Check any carpeting to make sure that it is firmly attached to the stairs. Fix any carpet that is loose or worn. Avoid having throw rugs at the top or bottom of the stairs. If you do have throw rugs, attach them to the floor with carpet tape. Make sure that you have a light switch at the top of the stairs and the bottom of the stairs. If you do not have them, ask someone to add them for you. What else can I do to help prevent falls? Wear shoes that: Do not have high heels. Have rubber bottoms. Are comfortable and fit you well. Are closed at the toe. Do not wear  sandals. If you use a stepladder: Make sure that it is fully opened. Do not climb a closed stepladder. Make sure that both sides of the stepladder are locked into place. Ask someone to hold it for you, if possible. Clearly mark and make sure that you can see: Any grab bars or handrails. First and last steps. Where the edge of each step is. Use tools that help you move around (mobility aids) if they are needed. These include: Canes. Walkers. Scooters. Crutches. Turn on the lights when you go into a dark area. Replace any light bulbs as soon as they burn out. Set up your furniture so you have a clear path. Avoid moving your furniture around. If any of your floors are uneven, fix them. If there are any pets around you, be aware of where they are. Review your medicines with your doctor. Some medicines can make you feel dizzy. This can increase your chance of falling. Ask your doctor what other things that you can do to help prevent falls. This information is not intended to replace advice given to you by your health care provider. Make sure you discuss any questions you have with your health care provider. Document Released: 01/14/2009 Document Revised: 08/26/2015 Document Reviewed: 04/24/2014 Elsevier Interactive Patient Education  2017 [employer].

## 2021-03-02 NOTE — Progress Notes (Signed)
Subjective:   Arthur Foster is a 70 y.o. male who presents for Medicare Annual/Subsequent preventive examination. Virtual Visit via Telephone Note  I connected with  Arthur Foster on 03/02/21 at  9:00 AM EST by telephone and verified that I am speaking with the correct person using two identifiers.  Location: Patient: Home Provider: [facility] Persons participating in the virtual visit: patient/Nurse Health Advisor   I discussed the limitations, risks, security and privacy concerns of performing an evaluation and management service by telephone and the availability of in person appointments. The patient expressed understanding and agreed to proceed.  Interactive audio and video telecommunications were attempted between this nurse and patient, however failed, due to patient having technical difficulties OR patient did not have access to video capability.  We continued and completed visit with audio only.  Some vital signs may be absent or patient reported.   Chriss Driver, LPN  Review of Systems     Cardiac Risk Factors include: advanced age (>64men, >12 women);hypertension;dyslipidemia;obesity (BMI >30kg/m2)     Objective:    Today's Vitals   03/02/21 0905  Weight: (!) 314 lb (142.4 kg)  Height: 6' 1.5" (1.867 m)   Body mass index is 40.87 kg/m.  Advanced Directives 03/02/2021 04/01/2019 02/04/2019  Does Patient Have a Medical Advance Directive? Yes Yes Yes  Type of Paramedic of [place];Living will - [place];Living will  Does patient want to make changes to medical advance directive? - - No - Patient declined  Copy of [place] in Chart? No - copy requested - No - copy requested    Current Medications (verified) Outpatient Encounter Medications as of 03/02/2021  Medication Sig   ALPRAZolam (XANAX) 0.25 MG tablet TAKE 1 TO 2 TABLETS DAILY AS NEEDED   cetirizine (ZYRTEC) 10 MG tablet TAKE 1 TABLET DAILY    EPINEPHrine 0.3 mg/0.3 mL IJ SOAJ injection Inject 0.3 mg into the muscle as needed for anaphylaxis.   escitalopram (LEXAPRO) 20 MG tablet Take 1 tablet (20 mg total) by mouth daily.   lisinopril (ZESTRIL) 20 MG tablet Take 1 tablet (20 mg total) by mouth daily.   Testosterone (ANDROGEL) 20.25 MG/1.25GM (1.62%) GEL Place 2 Pump onto the skin daily.   Vitamin D, Ergocalciferol, (DRISDOL) 1.25 MG (50000 UNIT) CAPS capsule Take 1 capsule (50,000 Units total) by mouth once a week.   No facility-administered encounter medications on file as of 03/02/2021.    Allergies (verified) Bee venom, Other, Short ragweed pollen ext, and Ambien [zolpidem tartrate]   History: Past Medical History:  Diagnosis Date   Anxiety    BPH (benign prostatic hyperplasia)    Depression    Hyperlipidemia    Hypertension    Testosterone deficiency    Past Surgical History:  Procedure Laterality Date   FOOT SURGERY     HERNIA REPAIR Left inguinal   Family History  Problem Relation Age of Onset   Hypertension Mother    Alzheimer's disease Mother    Hypertension Father    Alzheimer's disease Father    Social History   Socioeconomic History   Marital status: Married    Spouse name: [name]   Number of children: 3   Years of education: 12   Highest education level: High school graduate  Occupational History   Occupation: retired    Comment: works a few hours a week  Tobacco Use   Smoking status: Former    Types: Cigarettes  Quit date: 74    Years since quitting: 24.9   Smokeless tobacco: Former    Types: Chew    Quit date: 2002  Vaping Use   Vaping Use: Never used  Substance and Sexual Activity   Alcohol use: Yes    Alcohol/week: 14.0 standard drinks    Types: 14 Cans of beer per week   Drug use: No   Sexual activity: Yes    Birth control/protection: None  Other Topics Concern   Not on file  Social History Narrative   3 daughters, 2 live in [place]. 6 grandchildren.   Was married x 33  years prior to first wife's death.   Remarried to [employer].   Social Determinants of Health   Financial Resource Strain: Low Risk    Difficulty of Paying Living Expenses: Not hard at all  Food Insecurity: No Food Insecurity   Worried About Charity fundraiser in the Last Year: Never true   [place] in the Last Year: Never true  Transportation Needs: No Transportation Needs   Lack of Transportation (Medical): No   Lack of Transportation (Non-Medical): No  Physical Activity: Sufficiently Active   Days of Exercise per Week: 5 days   Minutes of Exercise per Session: 30 min  Stress: No Stress Concern Present   Feeling of Stress : Not at all  Social Connections: Socially Isolated   Frequency of Communication with Friends and Family: More than three times a week   Frequency of Social Gatherings with Friends and Family: More than three times a week   Attends Religious Services: Never   Marine scientist or Organizations: No   Attends Archivist Meetings: Never   Marital Status: Widowed    Tobacco Counseling Counseling given: Not Answered   Clinical Intake:  Pre-visit preparation completed: Yes  Pain : No/denies pain     BMI - recorded: 40.87 Nutritional Status: BMI > 30  Obese Nutritional Risks: None Diabetes: No  How often do you need to have someone help you when you read instructions, pamphlets, or other written materials from your doctor or pharmacy?: 1 - Never  Diabetic?No  Interpreter Needed?: No  Information entered by :: MJ Lysha Schrade, LPN   Activities of Daily Living In your present state of health, do you have any difficulty performing the following activities: 03/02/2021  Hearing? N  Vision? N  Difficulty concentrating or making decisions? N  Walking or climbing stairs? N  Dressing or bathing? N  Doing errands, shopping? N  Preparing Food and eating ? N  Using the Toilet? N  In the past six months, have you accidently leaked urine? N   Do you have problems with loss of bowel control? N  Managing your Medications? N  Managing your Finances? N  Housekeeping or managing your Housekeeping? N  Some recent data might be hidden    Patient Care Team: Ivy Lynn, NP as PCP - General (Nurse Practitioner) Ivy Lynn, NP as Nurse Practitioner (Nurse Practitioner)  Indicate any recent Medical Services you may have received from other than [facility] providers in the past year (date may be approximate).     Assessment:   This is a routine wellness examination for [place].  Hearing/Vision screen Hearing Screening - Comments:: Some hearing loss to L ear.  Vision Screening - Comments:: No glasses. [facility]. 2022.  Dietary issues and exercise activities discussed: Current Exercise Habits: Home exercise routine, Type of exercise: walking, Time (Minutes):  30, Frequency (Times/Week): 5, Weekly Exercise (Minutes/Week): 150, Intensity: Mild, Exercise limited by: cardiac condition(s);orthopedic condition(s)   Goals Addressed             This Visit's Progress    DIET - INCREASE WATER INTAKE   On track    Try to drink 6-8 glasses of water daily        Depression Screen PHQ 2/9 Scores 03/02/2021 01/11/2021 10/08/2020 09/23/2020 09/01/2020 07/14/2020 06/24/2020  PHQ - 2 Score 0 0 0 0 0 0 0  PHQ- 9 Score - - 0 - - - 0    Fall Risk Fall Risk  03/02/2021 01/11/2021 10/08/2020 09/23/2020 09/01/2020  Falls in the past year? 0 0 0 0 0  Number falls in past yr: 0 - - - -  Injury with Fall? 0 - - - -  Risk for fall due to : No Fall Risks - - - -  Follow up Falls prevention discussed - - - -    FALL RISK PREVENTION PERTAINING TO THE HOME:  Any stairs in or around the home? No  If so, are there any without handrails? No  Home free of loose throw rugs in walkways, pet beds, electrical cords, etc? Yes  Adequate lighting in your home to reduce risk of falls? Yes   ASSISTIVE DEVICES UTILIZED TO PREVENT FALLS:  Life alert? No   Use of a cane, walker or w/c? No  Grab bars in the bathroom? Yes  Shower chair or bench in shower? No  Elevated toilet seat or a handicapped toilet? No   TIMED UP AND GO:  Was the test performed? No .  Phone visit.  Cognitive Function: MMSE - Mini Mental State Exam 12/19/2012  Orientation to time 5  Orientation to Place 5  Registration 3  Attention/ Calculation 5  Recall 2  Language- name 2 objects 2  Language- repeat 1  Language- follow 3 step command 3  Language- read & follow direction 1  Write a sentence 1  Copy design 1  Total score 29     6CIT Screen 03/02/2021 02/04/2019  What Year? 0 points 0 points  What month? 0 points 0 points  What time? 0 points 0 points  Count back from 20 0 points 0 points  Months in reverse 0 points 0 points  Repeat phrase 0 points 0 points  Total Score 0 0    Immunizations Immunization History  Administered Date(s) Administered   Fluad Quad(high Dose 65+) 12/21/2018, 01/11/2021   Influenza Whole 03/03/2012   Influenza,inj,Quad PF,6+ Mos 01/08/2014, 03/06/2016   Influenza-Unspecified 01/11/2018, 12/13/2018   Pneumococcal Conjugate-13 05/26/2019   Pneumococcal Polysaccharide-23 06/24/2020   Tdap 12/03/2011   Zoster Recombinat (Shingrix) 12/23/2018, 02/24/2019    TDAP status: Up to date  Flu Vaccine status: Up to date  Pneumococcal vaccine status: Up to date  Covid-19 vaccine status: Declined, Education has been provided regarding the importance of this vaccine but patient still declined. Advised may receive this vaccine at local pharmacy or Health Dept.or vaccine clinic. Aware to provide a copy of the vaccination record if obtained from local pharmacy or Health Dept. Verbalized acceptance and understanding.  Qualifies for Shingles Vaccine? Yes   Zostavax completed Yes   Shingrix Completed?: Yes  Screening Tests Health Maintenance  Topic Date Due   COVID-19 Vaccine (1) Never done   Hepatitis C Screening  07/14/2021  (Originally 01/09/1969)   TETANUS/TDAP  12/02/2021   COLONOSCOPY (Pts 45-29yrs Insurance coverage will need to be confirmed)  07/19/2022   Pneumonia Vaccine 30+ Years old  Completed   INFLUENZA VACCINE  Completed   Zoster Vaccines- Shingrix  Completed   HPV VACCINES  Aged Out    Health Maintenance  Health Maintenance Due  Topic Date Due   COVID-19 Vaccine (1) Never done    Colorectal cancer screening: Type of screening: Colonoscopy. Completed 07/18/2012. Repeat every 10 years  Lung Cancer Screening: (Low Dose CT Chest recommended if Age 16-80 years, 30 pack-year currently smoking OR have quit w/in 15years.) does not qualify.  Quit over 15 years.  Additional Screening:  Hepatitis C Screening: does qualify; Completed due.  Vision Screening: Recommended annual ophthalmology exams for early detection of glaucoma and other disorders of the eye. Is the patient up to date with their annual eye exam?  Yes  Who is the provider or what is the name of the office in which the patient attends annual eye exams? [facility] If pt is not established with a provider, would they like to be referred to a provider to establish care? No .   Dental Screening: Recommended annual dental exams for proper oral hygiene  Community Resource Referral / Chronic Care Management: CRR required this visit?  No   CCM required this visit?  No      Plan:     I have personally reviewed and noted the following in the patient's chart:   Medical and social history Use of alcohol, tobacco or illicit drugs  Current medications and supplements including opioid prescriptions. Patient is not currently taking opioid prescriptions. Functional ability and status Nutritional status Physical activity Advanced directives List of other physicians Hospitalizations, surgeries, and ER visits in previous 12 months Vitals Screenings to include cognitive, depression, and falls Referrals and appointments  In  addition, I have reviewed and discussed with patient certain preventive protocols, quality metrics, and best practice recommendations. A written personalized care plan for preventive services as well as general preventive health recommendations were provided to patient.     Chriss Driver, LPN   02/72/[id number]   Nurse Notes: Phone visit. Pt up to date on all age appropriate health maintenance and vaccines.

## 2021-04-11 DIAGNOSIS — Z01812 Encounter for preprocedural laboratory examination: Secondary | ICD-10-CM | POA: Diagnosis not present

## 2021-04-11 DIAGNOSIS — M25562 Pain in left knee: Secondary | ICD-10-CM | POA: Diagnosis not present

## 2021-04-11 DIAGNOSIS — R791 Abnormal coagulation profile: Secondary | ICD-10-CM | POA: Diagnosis not present

## 2021-04-22 ENCOUNTER — Other Ambulatory Visit: Payer: Self-pay | Admitting: Nurse Practitioner

## 2021-04-22 DIAGNOSIS — F339 Major depressive disorder, recurrent, unspecified: Secondary | ICD-10-CM

## 2021-04-22 DIAGNOSIS — F411 Generalized anxiety disorder: Secondary | ICD-10-CM

## 2021-04-25 ENCOUNTER — Encounter: Payer: Self-pay | Admitting: Nurse Practitioner

## 2021-04-25 ENCOUNTER — Ambulatory Visit (INDEPENDENT_AMBULATORY_CARE_PROVIDER_SITE_OTHER): Payer: Medicare Other | Admitting: Nurse Practitioner

## 2021-04-25 VITALS — BP 119/69 | HR 61 | Temp 97.9°F | Ht 73.5 in | Wt 290.0 lb

## 2021-04-25 DIAGNOSIS — I10 Essential (primary) hypertension: Secondary | ICD-10-CM

## 2021-04-25 DIAGNOSIS — E782 Mixed hyperlipidemia: Secondary | ICD-10-CM

## 2021-04-25 DIAGNOSIS — F411 Generalized anxiety disorder: Secondary | ICD-10-CM

## 2021-04-25 DIAGNOSIS — F339 Major depressive disorder, recurrent, unspecified: Secondary | ICD-10-CM | POA: Diagnosis not present

## 2021-04-25 DIAGNOSIS — K2100 Gastro-esophageal reflux disease with esophagitis, without bleeding: Secondary | ICD-10-CM | POA: Diagnosis not present

## 2021-04-25 LAB — COMPREHENSIVE METABOLIC PANEL
ALT: 23 IU/L (ref 0–44)
AST: 16 IU/L (ref 0–40)
Albumin/Globulin Ratio: 1.7 (ref 1.2–2.2)
Albumin: 4.4 g/dL (ref 3.8–4.8)
Alkaline Phosphatase: 72 IU/L (ref 44–121)
BUN/Creatinine Ratio: 15 (ref 10–24)
BUN: 17 mg/dL (ref 8–27)
Bilirubin Total: 1.1 mg/dL (ref 0.0–1.2)
CO2: 24 mmol/L (ref 20–29)
Calcium: 9.6 mg/dL (ref 8.6–10.2)
Chloride: 104 mmol/L (ref 96–106)
Creatinine, Ser: 1.13 mg/dL (ref 0.76–1.27)
Globulin, Total: 2.6 g/dL (ref 1.5–4.5)
Glucose: 105 mg/dL — ABNORMAL HIGH (ref 70–99)
Potassium: 5.2 mmol/L (ref 3.5–5.2)
Sodium: 140 mmol/L (ref 134–144)
Total Protein: 7 g/dL (ref 6.0–8.5)
eGFR: 71 mL/min/{1.73_m2} (ref >59)

## 2021-04-25 LAB — CBC WITH DIFFERENTIAL/PLATELET
Basophils Absolute: 0 10*3/uL (ref 0.0–0.2)
Basos: 1 %
EOS (ABSOLUTE): 0.2 10*3/uL (ref 0.0–0.4)
Eos: 2 %
Hematocrit: 46.4 % (ref 37.5–51.0)
Hemoglobin: 15.6 g/dL (ref 13.0–17.7)
Immature Grans (Abs): 0.1 10*3/uL (ref 0.0–0.1)
Immature Granulocytes: 1 %
Lymphocytes Absolute: 2 10*3/uL (ref 0.7–3.1)
Lymphs: 26 %
MCH: 30.4 pg (ref 26.6–33.0)
MCHC: 33.6 g/dL (ref 31.5–35.7)
MCV: 90 fL (ref 79–97)
Monocytes Absolute: 0.5 10*3/uL (ref 0.1–0.9)
Monocytes: 7 %
Neutrophils Absolute: 5.1 10*3/uL (ref 1.4–7.0)
Neutrophils: 63 %
Platelets: 373 10*3/uL (ref 150–450)
RBC: 5.13 x10E6/uL (ref 4.14–5.80)
RDW: 12.2 % (ref 11.6–15.4)
WBC: 7.9 10*3/uL (ref 3.4–10.8)

## 2021-04-25 LAB — LIPID PANEL
Chol/HDL Ratio: 4.4 ratio (ref 0.0–5.0)
Cholesterol, Total: 198 mg/dL (ref 100–199)
HDL: 45 mg/dL (ref >39)
LDL Chol Calc (NIH): 129 mg/dL — ABNORMAL HIGH (ref 0–99)
Triglycerides: 134 mg/dL (ref 0–149)
VLDL Cholesterol Cal: 24 mg/dL (ref 5–40)

## 2021-04-25 MED ORDER — ALPRAZOLAM 0.25 MG PO TABS: ORAL_TABLET | ORAL | 1 refills | Status: DC

## 2021-04-25 MED ORDER — LISINOPRIL 20 MG PO TABS: 20.0000 mg | ORAL_TABLET | Freq: Every day | ORAL | 3 refills | Status: DC

## 2021-04-25 MED ORDER — ESCITALOPRAM OXALATE 20 MG PO TABS: 20.0000 mg | ORAL_TABLET | Freq: Every day | ORAL | 3 refills | Status: DC

## 2021-04-25 NOTE — Assessment & Plan Note (Addendum)
Patient is progressively losing weight with calorie counting, diet and exercise. Patient has lost a total of 24 lb and will continue to work on current plan.

## 2021-04-25 NOTE — Progress Notes (Signed)
Established Patient Office Visit  Subjective:  Patient ID: Arthur Foster, male    DOB: June 28, 1950  Age: 71 y.o. MRN: [id number]  CC:  Chief Complaint  Patient presents with   Anxiety   Depression   Hypertension    HPI [name]  presents for follow up of hypertension. Patient was diagnosed in 07/04/2022 The patient is tolerating the medication well without side effects. Compliance with treatment has been good; including taking medication as directed , maintains a healthy diet and regular exercise regimen , and following up as directed.      Depression screen [name] 2/9 04/25/2021 03/02/2021 01/11/2021  Decreased Interest 0 0 0  Down, Depressed, Hopeless 0 0 0  PHQ - 2 Score 0 0 0  Altered sleeping 0 - -  Tired, decreased energy 0 - -  Change in appetite 0 - -  Feeling bad or failure about yourself  0 - -  Trouble concentrating 0 - -  Moving slowly or fidgety/restless 0 - -  Suicidal thoughts 0 - -  PHQ-9 Score 0 - -  Difficult doing work/chores Not difficult at all - -  Some recent data might be hidden   Anxiety, Follow-up  He was last seen for anxiety 3 months ago. Changes made at last visit include continue xanax as prescribed daily and lexapro.   He reports excellent compliance with treatment. He reports good tolerance of treatment. He is not having side effects.  He feels his anxiety is mild and Improved since last visit.  Symptoms: No chest pain No difficulty concentrating  No dizziness No fatigue  No feelings of losing control No insomnia  No irritable No palpitations  No panic attacks No racing thoughts  No shortness of breath No sweating  No tremors/shakes    GAD-7 Results GAD-7 Generalized Anxiety Disorder Screening Tool 04/25/2021 09/01/2020 05/21/2020  1. Feeling Nervous, Anxious, or on Edge 0 0 0  2. Not Being Able to Stop or Control Worrying 0 0 0  3. Worrying Too Much About Different Things 0 0 0  4. Trouble Relaxing 0 0 0  5. Being So Restless it's  Hard To Sit Still 0 0 0  6. Becoming Easily Annoyed or Irritable 0 0 0  7. Feeling Afraid As If Something Awful Might Happen 0 0 0  Total GAD-7 Score 0 0 0  Difficulty At Work, Home, or Getting  Along With Others? Not difficult at all - -    PHQ-9 Scores PHQ9 SCORE ONLY 04/25/2021 03/02/2021 01/11/2021  PHQ-9 Total Score 0 0 0    GERD, Follow up:  The patient was last seen for GERD 3 months ago. Changes made since that visit include continue diet and exercise modification with Tums OTC as needed.[name]  He reports good compliance with treatment. He is not having side effects. [name]  He IS experiencing  no new symptoms . He is NOT experiencing belching and eructation, bilious reflux, choking on food, cough, or deep pressure at base of neck  Obesity: Patient complains of obesity. Patient cites health, increased physical ability, self-image, surgical clearance as reasons for wanting to lose weight.  Obesity History Weight in late teens: Patient can not remember lb. Period of greatest weight gain: 314 lb during late adult years Lowest adult weight: patient can not remember Highest adult weight: 314 lb Amount of time at present weight: few years    History of Weight Loss Efforts Greatest amount of weight lost: 24 lb over 3 months Amount  of time that loss was maintained: 3 months Circumstances associated with regain of weight: no weight has been gained back in the past 3 months Successful weight loss techniques attempted: very low calorie diet and diet and exercise Unsuccessful weight loss techniques attempted:  Never tried  Current Exercise Habits none  Current Eating Habits Number of regular meals per day: 3 Number of snacking episodes per day: 0 Who shops for food? patient and spouse Who prepares food? patient and spouse Who eats with patient? patient and wife Binge behavior?: no Purge behavior? no Anorexic behavior? no Eating precipitated by stress? no Guilt feelings associated  with eating? no  Other Potential Contributing Factors Use of alcohol: average once in a while Use of medications that may cause weight gain none History of past abuse? none Psych History: anxiety Comorbidities: GERD and hypertension     Past Medical History:  Diagnosis Date   Anxiety    BPH (benign prostatic hyperplasia)    Depression    Hyperlipidemia    Hypertension    Testosterone deficiency     Past Surgical History:  Procedure Laterality Date   FOOT SURGERY     HERNIA REPAIR Left inguinal    Family History  Problem Relation Age of Onset   Hypertension Mother    Alzheimer's disease Mother    Hypertension Father    Alzheimer's disease Father     Social History   Socioeconomic History   Marital status: Married    Spouse name: [name]   Number of children: 3   Years of education: 12   Highest education level: High school graduate  Occupational History   Occupation: retired    Comment: works a few hours a week  Tobacco Use   Smoking status: Former    Types: Cigarettes    Quit date: 1998    Years since quitting: 25.0   Smokeless tobacco: Former    Types: Chew    Quit date: 2002  Vaping Use   Vaping Use: Never used  Substance and Sexual Activity   Alcohol use: Yes    Alcohol/week: 14.0 standard drinks    Types: 14 Cans of beer per week   Drug use: No   Sexual activity: Yes    Birth control/protection: None  Other Topics Concern   Not on file  Social History Narrative   3 daughters, 2 live in [place]. 6 grandchildren.   Was married x 33 years prior to first wife's death.   Remarried to [employer].   Social Determinants of Health   Financial Resource Strain: Low Risk    Difficulty of Paying Living Expenses: Not hard at all  Food Insecurity: No Food Insecurity   Worried About Charity fundraiser in the Last Year: Never true   [place] in the Last Year: Never true  Transportation Needs: No Transportation Needs   Lack of Transportation  (Medical): No   Lack of Transportation (Non-Medical): No  Physical Activity: Sufficiently Active   Days of Exercise per Week: 5 days   Minutes of Exercise per Session: 30 min  Stress: No Stress Concern Present   Feeling of Stress : Not at all  Social Connections: Socially Isolated   Frequency of Communication with Friends and Family: More than three times a week   Frequency of Social Gatherings with Friends and Family: More than three times a week   Attends Religious Services: Never   Marine scientist or Organizations: No   Attends Club or  Organization Meetings: Never   Marital Status: Widowed  Human resources officer Violence: Not At Risk   Fear of Current or Ex-Partner: No   Emotionally Abused: No   Physically Abused: No   Sexually Abused: No    Outpatient Medications Prior to Visit  Medication Sig Dispense Refill   cetirizine (ZYRTEC) 10 MG tablet TAKE 1 TABLET DAILY 90 tablet 1   Vitamin D, Ergocalciferol, (DRISDOL) 1.25 MG (50000 UNIT) CAPS capsule Take 1 capsule (50,000 Units total) by mouth once a week. 13 capsule 5   ALPRAZolam (XANAX) 0.25 MG tablet TAKE 1 TO 2 TABLETS DAILY AS NEEDED 45 tablet 1   escitalopram (LEXAPRO) 20 MG tablet Take 1 tablet (20 mg total) by mouth daily. 90 tablet 3   lisinopril (ZESTRIL) 20 MG tablet Take 1 tablet (20 mg total) by mouth daily. 90 tablet 3   EPINEPHrine 0.3 mg/0.3 mL IJ SOAJ injection Inject 0.3 mg into the muscle as needed for anaphylaxis. (Patient not taking: Reported on 04/25/2021) 1 each 3   Testosterone (ANDROGEL) 20.25 MG/1.25GM (1.62%) GEL Place 2 Pump onto the skin daily. (Patient not taking: Reported on 04/25/2021) 1.25 g 2   No facility-administered medications prior to visit.    Allergies  Allergen Reactions   Bee Venom    Other    Short Ragweed Pollen Ext    Ambien [Zolpidem Tartrate] Other (See Comments)    ROS Review of Systems  Constitutional: Negative.   HENT: Negative.    Eyes: Negative.   Respiratory:  Negative.    Cardiovascular: Negative.   Gastrointestinal: Negative.   Genitourinary: Negative.   Musculoskeletal: Negative.   All other systems reviewed and are negative.    Objective:    Physical Exam Vitals and nursing note reviewed.  Constitutional:      Appearance: He is obese.  HENT:     Head: Normocephalic.     Right Ear: External ear normal.     Left Ear: External ear normal.     Nose: Nose normal.     Mouth/Throat:     Mouth: Mucous membranes are moist.     Pharynx: Oropharynx is clear.  Eyes:     Conjunctiva/sclera: Conjunctivae normal.  Cardiovascular:     Rate and Rhythm: Normal rate and regular rhythm.  Pulmonary:     Effort: Pulmonary effort is normal.     Breath sounds: Normal breath sounds.  Abdominal:     General: Bowel sounds are normal.  Musculoskeletal:        General: Normal range of motion.  Skin:    General: Skin is warm.  Neurological:     Mental Status: He is oriented to person, place, and time.    BP 119/69    Pulse 61    Temp 97.9 F (36.6 C) (Temporal)    Ht 6' 1.5" (1.867 m)    Wt 290 lb (131.5 kg)    BMI 37.74 kg/m  Wt Readings from Last 3 Encounters:  04/25/21 290 lb (131.5 kg)  03/02/21 (!) 314 lb (142.4 kg)  01/11/21 (!) 314 lb (142.4 kg)     Health Maintenance Due  Topic Date Due   COVID-19 Vaccine (1) Never done    There are no preventive care reminders to display for this patient.  Lab Results  Component Value Date   TSH 1.970 05/26/2019   Lab Results  Component Value Date   WBC 6.8 01/11/2021   HGB 16.1 01/11/2021   HCT 48.2 01/11/2021   MCV 89  01/11/2021   PLT 371 01/11/2021   Lab Results  Component Value Date   NA 141 01/11/2021   K 5.3 (H) 01/11/2021   CO2 25 01/11/2021   GLUCOSE 116 (H) 01/11/2021   BUN 18 01/11/2021   CREATININE 1.18 01/11/2021   BILITOT 1.1 01/11/2021   ALKPHOS 77 01/11/2021   AST 22 01/11/2021   ALT 27 01/11/2021   PROT 7.0 01/11/2021   ALBUMIN 4.5 01/11/2021   CALCIUM 9.7  01/11/2021   EGFR 67 01/11/2021   Lab Results  Component Value Date   CHOL 230 (H) 01/11/2021   Lab Results  Component Value Date   HDL 45 01/11/2021   Lab Results  Component Value Date   LDLCALC 153 (H) 01/11/2021   Lab Results  Component Value Date   TRIG 174 (H) 01/11/2021   Lab Results  Component Value Date   CHOLHDL 5.1 (H) 01/11/2021   Lab Results  Component Value Date   HGBA1C 5.8 (H) 05/25/2020      Assessment & Plan:   Problem List Items Addressed This Visit       Cardiovascular and Mediastinum   Essential hypertension, benign - Primary    Hypertension well controlled with current       Relevant Medications   lisinopril (ZESTRIL) 20 MG tablet   Other Relevant Orders   CBC with Differential   Comprehensive metabolic panel   Lipid Panel     Digestive   GERD (gastroesophageal reflux disease)     Other   Depression, recurrent (HCC)    No signs or symptoms of depression , PHQ completed       Relevant Medications   ALPRAZolam (XANAX) 0.25 MG tablet   escitalopram (LEXAPRO) 20 MG tablet   Morbid obesity ([place])    Patient is progressively losing weight with calorie counting, diet and exercise. Patient has lost a total of 24 lb and will continue to work on current plan.      Generalized anxiety disorder    Anxiety well controlled on current medication. Xanax .25 mg tablet by mouth daily. Completed GAD-9, education provided, printed hand out given. RX refill sent to pharmcacy      Relevant Medications   ALPRAZolam (XANAX) 0.25 MG tablet   escitalopram (LEXAPRO) 20 MG tablet   Mixed dyslipidemia    Labs completed results pending. No new signs of hyperlipidemia. continue low cholesterol diet, exercise and weight loss       Meds ordered this encounter  Medications   ALPRAZolam (XANAX) 0.25 MG tablet    Sig: TAKE 1 TO 2 TABLETS DAILY AS NEEDED    Dispense:  45 tablet    Refill:  1    Order Specific Question:   Supervising Provider    Answer:    Jeneen Rinks   lisinopril (ZESTRIL) 20 MG tablet    Sig: Take 1 tablet (20 mg total) by mouth daily.    Dispense:  90 tablet    Refill:  3    Order Specific Question:   Supervising Provider    Answer:   Claretta Fraise [[id number]]   escitalopram (LEXAPRO) 20 MG tablet    Sig: Take 1 tablet (20 mg total) by mouth daily.    Dispense:  90 tablet    Refill:  3    Order Specific Question:   Supervising Provider    Answer:   Claretta Fraise [[id number]]    Follow-up: Return in about 6 months (around 10/23/2021).    Quinn Axe  Christiana Pellant, NP

## 2021-04-25 NOTE — Assessment & Plan Note (Signed)
Anxiety well controlled on current medication. Xanax .25 mg tablet by mouth daily. Completed GAD-9, education provided, printed hand out given. RX refill sent to pharmcacy

## 2021-04-25 NOTE — Assessment & Plan Note (Signed)
No signs or symptoms of depression , PHQ completed

## 2021-04-25 NOTE — Assessment & Plan Note (Signed)
Labs completed results pending. No new signs of hyperlipidemia. continue low cholesterol diet, exercise and weight loss

## 2021-04-25 NOTE — Assessment & Plan Note (Signed)
Hypertension well controlled with current

## 2021-04-25 NOTE — Patient Instructions (Signed)
Hypertension, Adult Hypertension is another name for high blood pressure. High blood pressure forces your heart to work harder to pump blood. This can cause problems over time. There are two numbers in a blood pressure reading. There is a top number (systolic) over a bottom number (diastolic). It is best to have a blood pressure that is below 120/80. Healthy choices can help lower your blood pressure, or you may need medicine to help lower it. What are the causes? The cause of this condition is not known. Some conditions may be related to high blood pressure. What increases the risk? Smoking. Having type 2 diabetes mellitus, high cholesterol, or both. Not getting enough exercise or physical activity. Being overweight. Having too much fat, sugar, calories, or salt (sodium) in your diet. Drinking too much alcohol. Having long-term (chronic) kidney disease. Having a family history of high blood pressure. Age. Risk increases with age. Race. You may be at higher risk if you are African American. Gender. Men are at higher risk than women before age 29. After age 92, women are at higher risk than men. Having obstructive sleep apnea. Stress. What are the signs or symptoms? High blood pressure may not cause symptoms. Very high blood pressure (hypertensive crisis) may cause: Headache. Feelings of worry or nervousness (anxiety). Shortness of breath. Nosebleed. A feeling of being sick to your stomach (nausea). Throwing up (vomiting). Changes in how you see. Very bad chest pain. Seizures. How is this treated? This condition is treated by making healthy lifestyle changes, such as: Eating healthy foods. Exercising more. Drinking less alcohol. Your health care provider may prescribe medicine if lifestyle changes are not enough to get your blood pressure under control, and if: Your top number is above 130. Your bottom number is above 80. Your personal target blood pressure may vary. Follow  these instructions at home: Eating and drinking  If told, follow the DASH eating plan. To follow this plan: Fill one half of your plate at each meal with fruits and vegetables. Fill one fourth of your plate at each meal with whole grains. Whole grains include whole-wheat pasta, brown rice, and whole-grain bread. Eat or drink low-fat dairy products, such as skim milk or low-fat yogurt. Fill one fourth of your plate at each meal with low-fat (lean) proteins. Low-fat proteins include fish, chicken without skin, eggs, beans, and tofu. Avoid fatty meat, cured and processed meat, or chicken with skin. Avoid pre-made or processed food. Eat less than 1,500 mg of salt each day. Do not drink alcohol if: Your doctor tells you not to drink. You are pregnant, may be pregnant, or are planning to become pregnant. If you drink alcohol: Limit how much you use to: 0-1 drink a day for women. 0-2 drinks a day for men. Be aware of how much alcohol is in your drink. In the [place] one drink equals one 12 oz bottle of beer (355 mL), one 5 oz glass of wine (148 mL), or one 1 oz glass of hard liquor (44 mL). Lifestyle  Work with your doctor to stay at a healthy weight or to lose weight. Ask your doctor what the best weight is for you. Get at least 30 minutes of exercise most days of the week. This may include walking, swimming, or biking. Get at least 30 minutes of exercise that strengthens your muscles (resistance exercise) at least 3 days a week. This may include lifting weights or doing Pilates. Do not use any products that contain nicotine or tobacco, such  as cigarettes, e-cigarettes, and chewing tobacco. If you need help quitting, ask your doctor. Check your blood pressure at home as told by your doctor. Keep all follow-up visits as told by your doctor. This is important. Medicines Take over-the-counter and prescription medicines only as told by your doctor. Follow directions carefully. Do not skip doses of  blood pressure medicine. The medicine does not work as well if you skip doses. Skipping doses also puts you at risk for problems. Ask your doctor about side effects or reactions to medicines that you should watch for. Contact a doctor if you: Think you are having a reaction to the medicine you are taking. Have headaches that keep coming back (recurring). Feel dizzy. Have swelling in your ankles. Have trouble with your vision. Get help right away if you: Get a very bad headache. Start to feel mixed up (confused). Feel weak or numb. Feel faint. Have very bad pain in your: Chest. Belly (abdomen). Throw up more than once. Have trouble breathing. Summary Hypertension is another name for high blood pressure. High blood pressure forces your heart to work harder to pump blood. For most people, a normal blood pressure is less than 120/80. Making healthy choices can help lower blood pressure. If your blood pressure does not get lower with healthy choices, you may need to take medicine. This information is not intended to replace advice given to you by your health care provider. Make sure you discuss any questions you have with your health care provider. Document Revised: 11/28/2017 Document Reviewed: 11/28/2017 Elsevier Patient Education  2022 [place]. Managing Depression, Adult Depression is a mental health condition that affects your thoughts, feelings, and actions. Being diagnosed with depression can bring you relief if you did not know why you have felt or behaved a certain way. It could also leave you feeling overwhelmed with uncertainty about your future. Preparing yourself to manage your symptoms can help you feel more positive about your future. How to manage lifestyle changes Managing stress Stress is your body's reaction to life changes and events, both good and bad. Stress can add to your feelings of depression. Learning to manage your stress can help lessen your feelings of  depression. Try some of the following approaches to reducing your stress (stress reduction techniques): Listen to music that you enjoy and that inspires you. Try using a meditation app or take a meditation class. Develop a practice that helps you connect with your spiritual self. Walk in nature, pray, or go to a place of worship. Do some deep breathing. To do this, inhale slowly through your nose. Pause at the top of your inhale for a few seconds and then exhale slowly, letting your muscles relax. Practice yoga to help relax and work your muscles. Choose a stress reduction technique that suits your lifestyle and personality. These techniques take time and practice to develop. Set aside 5-15 minutes a day to do them. Therapists can offer training in these techniques. Other things you can do to manage stress include: Keeping a stress diary. Knowing your limits and saying no when you think something is too much. Paying attention to how you react to certain situations. You may not be able to control everything, but you can change your reaction. Adding humor to your life by watching funny films or TV shows. Making time for activities that you enjoy and that relax you.  Medicines Medicines, such as antidepressants, are often a part of treatment for depression. Talk with your pharmacist or health  care provider about all the medicines, supplements, and herbal products that you take, their possible side effects, and what medicines and other products are safe to take together. Make sure to report any side effects you may have to your health care provider. Relationships Your health care provider may suggest family therapy, couples therapy, or individual therapy as part of your treatment. How to recognize changes Everyone responds differently to treatment for depression. As you recover from depression, you may start to: Have more interest in doing activities. Feel less hopeless. Have more  energy. Overeat less often, or have a better appetite. Have better mental focus. It is important to recognize if your depression is not getting better or is getting worse. The symptoms you had in the beginning may return, such as: Tiredness (fatigue) or low energy. Eating too much or too little. Sleeping too much or too little. Feeling restless, agitated, or hopeless. Trouble focusing or making decisions. Unexplained physical complaints. Feeling irritable, angry, or aggressive. If you or your family members notice these symptoms coming back, let your health care provider know right away. Follow these instructions at home: Activity  Try to get some form of exercise each day, such as walking, biking, swimming, or lifting weights. Practice stress reduction techniques. Engage your mind by taking a class or doing some volunteer work. Lifestyle Get the right amount and quality of sleep. Cut down on using caffeine, tobacco, alcohol, and other potentially harmful substances. Eat a healthy diet that includes plenty of vegetables, fruits, whole grains, low-fat dairy products, and lean protein. Do not eat a lot of foods that are high in solid fats, added sugars, or salt (sodium). General instructions Take over-the-counter and prescription medicines only as told by your health care provider. Keep all follow-up visits as told by your health care provider. This is important. Where to find support Talking to others Friends and family members can be sources of support and guidance. Talk to trusted friends or family members about your condition. Explain your symptoms to them, and let them know that you are working with a health care provider to treat your depression. Tell friends and family members how they also can be helpful. Finances Find appropriate mental health providers that fit with your financial situation. Talk with your health care provider about options to get reduced prices on your  medicines. Where to find more information You can find support in your area from: Anxiety and Depression Association of America (ADAA): www.adaa.org Mental Health America: www.mentalhealthamerica.net [employer] on Mental Illness: www.nami.org Contact a health care provider if: You stop taking your antidepressant medicines, and you have any of these symptoms: Nausea. Headache. Light-headedness. Chills and body aches. Not being able to sleep (insomnia). You or your friends and family think your depression is getting worse. Get help right away if: You have thoughts of hurting yourself or others. If you ever feel like you may hurt yourself or others, or have thoughts about taking your own life, get help right away. Go to your nearest emergency department or: Call your local emergency services (911 in the [place] Call a suicide crisis helpline, such as the [place] at 825-417-5761 or 988 in the [place]. This is open 24 hours a day in the [place] Text the Crisis Text Line at 7650553041 (in the [place].). Summary If you are diagnosed with depression, preparing yourself to manage your symptoms is a good way to feel positive about your future. Work with your health care provider on a  management plan that includes stress reduction techniques, medicines (if applicable), therapy, and healthy lifestyle habits. Keep talking with your health care provider about how your treatment is working. If you have thoughts about taking your own life, call a suicide crisis helpline or text a crisis text line. This information is not intended to replace advice given to you by your health care provider. Make sure you discuss any questions you have with your health care provider. Document Revised: 10/13/2020 Document Reviewed: 01/29/2019 Elsevier Patient Education  2022 [place]. Generalized Anxiety Disorder, Adult Generalized anxiety disorder (GAD) is a mental health condition. Unlike  normal worries, anxiety related to GAD is not triggered by a specific event. These worries do not fade or get better with time. GAD interferes with relationships, work, and school. GAD symptoms can vary from mild to severe. People with severe GAD can have intense waves of anxiety with physical symptoms that are similar to panic attacks. What are the causes? The exact cause of GAD is not known, but the following are believed to have an impact: Differences in natural brain chemicals. Genes passed down from parents to children. Differences in the way threats are perceived. Development and stress during childhood. Personality. What increases the risk? The following factors may make you more likely to develop this condition: Being male. Having a family history of anxiety disorders. Being very shy. Experiencing very stressful life events, such as the death of a loved one. Having a very stressful family environment. What are the signs or symptoms? People with GAD often worry excessively about many things in their lives, such as their health and family. Symptoms may also include: Mental and emotional symptoms: Worrying excessively about natural disasters. Fear of being late. Difficulty concentrating. Fears that others are judging your performance. Physical symptoms: Fatigue. Headaches, muscle tension, muscle twitches, trembling, or feeling shaky. Feeling like your heart is pounding or beating very fast. Feeling out of breath or like you cannot take a deep breath. Having trouble falling asleep or staying asleep, or experiencing restlessness. Sweating. Nausea, diarrhea, or irritable bowel syndrome (IBS). Behavioral symptoms: Experiencing erratic moods or irritability. Avoidance of new situations. Avoidance of people. Extreme difficulty making decisions. How is this diagnosed? This condition is diagnosed based on your symptoms and medical history. You will also have a physical exam. Your  health care provider may perform tests to rule out other possible causes of your symptoms. To be diagnosed with GAD, a person must have anxiety that: Is out of his or her control. Affects several different aspects of his or her life, such as work and relationships. Causes distress that makes him or her unable to take part in normal activities. Includes at least three symptoms of GAD, such as restlessness, fatigue, trouble concentrating, irritability, muscle tension, or sleep problems. Before your health care provider can confirm a diagnosis of GAD, these symptoms must be present more days than they are not, and they must last for 6 months or longer. How is this treated? This condition may be treated with: Medicine. Antidepressant medicine is usually prescribed for long-term daily control. Anti-anxiety medicines may be added in severe cases, especially when panic attacks occur. Talk therapy (psychotherapy). Certain types of talk therapy can be helpful in treating GAD by providing support, education, and guidance. Options include: Cognitive behavioral therapy (CBT). People learn coping skills and self-calming techniques to ease their physical symptoms. They learn to identify unrealistic thoughts and behaviors and to replace them with more appropriate thoughts and behaviors. Acceptance  and commitment therapy (ACT). This treatment teaches people how to be mindful as a way to cope with unwanted thoughts and feelings. Biofeedback. This process trains you to manage your body's response (physiological response) through breathing techniques and relaxation methods. You will work with a therapist while machines are used to monitor your physical symptoms. Stress management techniques. These include yoga, meditation, and exercise. A mental health specialist can help determine which treatment is best for you. Some people see improvement with one type of therapy. However, other people require a combination of  therapies. Follow these instructions at home: Lifestyle Maintain a consistent routine and schedule. Anticipate stressful situations. Create a plan and allow extra time to work with your plan. Practice stress management or self-calming techniques that you have learned from your therapist or your health care provider. Exercise regularly and spend time outdoors. Eat a healthy diet that includes plenty of vegetables, fruits, whole grains, low-fat dairy products, and lean protein. Do not eat a lot of foods that are high in fat, added sugar, or salt (sodium). Drink plenty of water. Avoid alcohol. Alcohol can increase anxiety. Avoid caffeine and certain over-the-counter cold medicines. These may make you feel worse. Ask your pharmacist which medicines to avoid. General instructions Take over-the-counter and prescription medicines only as told by your health care provider. Understand that you are likely to have setbacks. Accept this and be kind to yourself as you persist to take better care of yourself. Anticipate stressful situations. Create a plan and allow extra time to work with your plan. Recognize and accept your accomplishments, even if you judge them as small. Spend time with people who care about you. Keep all follow-up visits. This is important. Where to find more information [place]: https://carter.com/ Substance Abuse and Mental Health Services: ktimeonline.com Contact a health care provider if: Your symptoms do not get better. Your symptoms get worse. You have signs of depression, such as: A persistently sad or irritable mood. Loss of enjoyment in activities that used to bring you joy. Change in weight or eating. Changes in sleeping habits. Get help right away if: You have thoughts about hurting yourself or others. If you ever feel like you may hurt yourself or others, or have thoughts about taking your own life, get help right away. Go to your nearest  emergency department or: Call your local emergency services (911 in the [place] Call a suicide crisis helpline, such as the [facility] at 715-481-6658 or 988 in the [place]. This is open 24 hours a day in the [place] Text the Crisis Text Line at 478-074-0039 (in the [place].). Summary Generalized anxiety disorder (GAD) is a mental health condition that involves worry that is not triggered by a specific event. People with GAD often worry excessively about many things in their lives, such as their health and family. GAD may cause symptoms such as restlessness, trouble concentrating, sleep problems, frequent sweating, nausea, diarrhea, headaches, and trembling or muscle twitching. A mental health specialist can help determine which treatment is best for you. Some people see improvement with one type of therapy. However, other people require a combination of therapies. This information is not intended to replace advice given to you by your health care provider. Make sure you discuss any questions you have with your health care provider. Document Revised: 10/13/2020 Document Reviewed: 07/11/2020 Elsevier Patient Education  [place].

## 2021-04-28 DIAGNOSIS — M170 Bilateral primary osteoarthritis of knee: Secondary | ICD-10-CM | POA: Diagnosis not present

## 2021-04-28 DIAGNOSIS — M1712 Unilateral primary osteoarthritis, left knee: Secondary | ICD-10-CM | POA: Diagnosis not present

## 2021-04-28 DIAGNOSIS — G8918 Other acute postprocedural pain: Secondary | ICD-10-CM | POA: Diagnosis not present

## 2021-05-02 ENCOUNTER — Ambulatory Visit: Payer: Medicare Other | Attending: Orthopedic Surgery | Admitting: Physical Therapy

## 2021-05-02 ENCOUNTER — Encounter: Payer: Self-pay | Admitting: Physical Therapy

## 2021-05-02 ENCOUNTER — Other Ambulatory Visit: Payer: Self-pay

## 2021-05-02 DIAGNOSIS — G8929 Other chronic pain: Secondary | ICD-10-CM | POA: Diagnosis not present

## 2021-05-02 DIAGNOSIS — R600 Localized edema: Secondary | ICD-10-CM | POA: Insufficient documentation

## 2021-05-02 DIAGNOSIS — M25662 Stiffness of left knee, not elsewhere classified: Secondary | ICD-10-CM | POA: Diagnosis not present

## 2021-05-02 DIAGNOSIS — M25562 Pain in left knee: Secondary | ICD-10-CM | POA: Insufficient documentation

## 2021-05-02 NOTE — Therapy (Signed)
[facility] [place], [place], [address] Phone: [phone]   Fax:  [phone]  Physical Therapy Evaluation  Patient Details  Name: Arthur Foster MRN: [id number] Date of Birth: [birth year] Referring Provider (PT): Edmonia Lynch MD   Encounter Date: 05/02/2021   PT End of Session - 05/02/21 1109     Visit Number 1    Number of Visits 12    Date for PT Re-Evaluation 06/13/21    Authorization Type FOTO AT LEAST EVERY 5TH VISIT.  PROGRESS NOTE AT 10TH VISIT.  KX MODIFIER AFTER 15 VISITS.    PT Start Time 1030    PT Stop Time 1114    PT Time Calculation (min) 44 min    Activity Tolerance Patient tolerated treatment well    Behavior During Therapy WFL for tasks assessed/performed             Past Medical History:  Diagnosis Date   Anxiety    BPH (benign prostatic hyperplasia)    Depression    Hyperlipidemia    Hypertension    Testosterone deficiency     Past Surgical History:  Procedure Laterality Date   FOOT SURGERY     HERNIA REPAIR Left inguinal    There were no vitals filed for this visit.    Subjective Assessment - 05/02/21 1110     Subjective COVID-19 screen performed prior to patient entering clinic.  The patient presenst to the clinic today s/p left partial knee replacement.  He is currently reporting a a 5/10 pain-level.[name]  He doing a HEP that includes the Zero Knee to improve his extension.  Being up a lot and range of motion increases his pain.  Rest and ice decrease is pain.  He is using a FWW for safe ambulation.    Pertinent History Prior right foot surgery, HTN, hernia repair.    How long can you walk comfortably? Around home with walker.    Patient Stated Goals Get back to normal life.    Currently in Pain? Yes    Pain Score 5     Pain Orientation Left    Pain Descriptors / Indicators Aching;Throbbing    Pain Type Chronic pain    Pain Onset More than a month ago    Aggravating Factors  See  above.    Pain Relieving Factors See above.                [facility] PT Assessment - 05/02/21 0001       Assessment   Medical Diagnosis S/p left partial knee replacement    Referring Provider (PT) Edmonia Lynch MD    Onset Date/Surgical Date --   04/28/21 (surgery date).     Precautions   Precaution Comments No ultrasound.      Restrictions   Weight Bearing Restrictions No      Balance Screen   Has the patient fallen in the past 6 months No    Has the patient had a decrease in activity level because of a fear of falling?  No    Is the patient reluctant to leave their home because of a fear of falling?  No      Home Environment   Living Environment Private residence      Observation/Other Assessments   Observations Aquacel and TED hose intact on left.      Observation/Other Assessments-Edema    Edema Circumferential      Circumferential Edema   Circumferential - Left  LT  3 cms > RT.      ROM / Strength   AROM / PROM / Strength AROM;Strength      AROM   Overall AROM Comments Left knee extension to -15 degrees adn passively to -10 degrees, active flexion to 95 degrees and passive to 100 degrees.      Strength   Overall Strength Comments Left hip and knee 4+/5.      Palpation   Palpation comment Especially tender over left knee medial joint line.      Ambulation/Gait   Gait Comments The patient is walking safely with a FWW.                        Objective measurements completed on examination: See above findings.       [facility] Adult PT Treatment/Exercise - 05/02/21 0001       Modalities   Modalities Vasopneumatic      Vasopneumatic   Number Minutes Vasopneumatic  15 minutes    Vasopnuematic Location  --   Left knee.   Vasopneumatic Pressure Low                          PT Long Term Goals - 05/02/21 1201       PT LONG TERM GOAL #1   Title Independent with a HEP.    Period Weeks    Status New      PT LONG TERM GOAL  #2   Title Perform ADL's with pain not > 3/10.    Time 6    Period Weeks    Status New      PT LONG TERM GOAL #3   Title Full active left knee extension in order to normalize gait.    Time 6    Period Weeks    Status New      PT LONG TERM GOAL #4   Title Active knee flexion to 120 degrees+ so the patient can perform functional tasks and do so with pain not > 2-3/10.    Time 6    Period Weeks    Status New      PT LONG TERM GOAL #5   Title Perform a reciprocating stair gait with one railing with pain not > 2-3/10.    Time 6    Period Weeks    Status New                    Plan - 05/02/21 1156     Clinical Impression Statement The patient presents to OPPT s/p left partial knee replacement performed on 04/28/21.   He is lacking some extension currently and his active flexion is to 95 degrees.  He has an expected amount of edema.  His CC is medial joint line pain.  He is walking safely with a FWW.  He is complinat to wearing his TED hose and states he is also using a Zero Knee to improve his extension.  Patient will benefit from skilled physical therapy intervention to address pain and deficits.    Personal Factors and Comorbidities Comorbidity 1;Other    Comorbidities Prior right foot surgery, HTN, hearnia repair.    Examination-Activity Limitations Other;Locomotion Level    Examination-Participation Restrictions Other    Stability/Clinical Decision Making Stable/Uncomplicated    Clinical Decision Making Low    Rehab Potential Excellent    PT Frequency 3x / week    PT Duration --  4-6 weeks.   PT Treatment/Interventions ADLs/Self Care Home Management;Cryotherapy;Electrical Stimulation;Moist Heat;Iontophoresis 4mg /ml Dexamethasone;Gait training;Functional mobility training;Therapeutic exercise;Neuromuscular re-education;Therapeutic activities;Patient/family education;Manual techniques;Vasopneumatic Device    PT Next Visit Plan Nustep with progression to recumbent bike.   Progress per TKA protocol.  Vasopneumatic.    Consulted and Agree with Plan of Care Patient             Patient will benefit from skilled therapeutic intervention in order to improve the following deficits and impairments:  Pain, Abnormal gait, Decreased range of motion, Increased edema, Decreased activity tolerance  Visit Diagnosis: Chronic pain of left knee - Plan: PT plan of care cert/re-cert  Localized edema  Stiffness of left knee, not elsewhere classified - Plan: PT plan of care cert/re-cert     Problem List Patient Active Problem List   Diagnosis Date Noted   Preop examination 01/11/2021   Back pain 06/24/2020   Dysuria 06/24/2020   Tendonitis, Achilles, right 05/27/2019   Controlled substance agreement signed 11/22/2018   Blood glucose elevated 11/22/2018   Vitamin D deficiency 11/22/2018   Sensorineural hearing loss (SNHL), bilateral 06/17/2018   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   Mixed dyslipidemia 08/08/2017   Abnormal EKG 08/08/2017   Cervical somatic dysfunction 03/06/2016   Hemorrhoid thrombosis 03/06/2016   Generalized anxiety disorder 03/06/2016   Left knee pain 11/05/2014   Allergic rhinitis 06/18/2013   Morbid obesity ([place]) 06/18/2013   BPH (benign prostatic hypertrophy) 07/03/2012   Essential hypertension, benign 07/03/2012   Depression, recurrent ([place]) 07/03/2012   GERD (gastroesophageal reflux disease) 07/03/2012   Testosterone deficiency 07/03/2012    Oyinkansola Truax, [place], PT 05/02/2021, 12:05 PM  [facility] [address] [place], [place], [address] Phone: [phone]   Fax:  [phone]  Name: Tilak Oakley MRN: [id number] Date of Birth: 03/07/1951

## 2021-05-04 ENCOUNTER — Other Ambulatory Visit: Payer: Self-pay

## 2021-05-04 ENCOUNTER — Ambulatory Visit: Payer: Medicare Other | Attending: Orthopedic Surgery

## 2021-05-04 DIAGNOSIS — M25562 Pain in left knee: Secondary | ICD-10-CM | POA: Diagnosis not present

## 2021-05-04 DIAGNOSIS — R600 Localized edema: Secondary | ICD-10-CM | POA: Insufficient documentation

## 2021-05-04 DIAGNOSIS — M25662 Stiffness of left knee, not elsewhere classified: Secondary | ICD-10-CM | POA: Diagnosis not present

## 2021-05-04 DIAGNOSIS — G8929 Other chronic pain: Secondary | ICD-10-CM | POA: Insufficient documentation

## 2021-05-04 NOTE — Therapy (Signed)
[facility] [place], [place], [address] Phone: [phone]   Fax:  [phone]  Physical Therapy Treatment  Patient Details  Name: Arthur Foster MRN: [id number] Date of Birth: March 07, 1951 Referring Provider (PT): Edmonia Lynch MD   Encounter Date: 05/04/2021   PT End of Session - 05/04/21 1034     Visit Number 2    Number of Visits 12    Date for PT Re-Evaluation 06/13/21    Authorization Type FOTO AT LEAST EVERY 5TH VISIT.  PROGRESS NOTE AT 10TH VISIT.  KX MODIFIER AFTER 15 VISITS.    PT Start Time 1031    PT Stop Time 1132    PT Time Calculation (min) 61 min    Activity Tolerance Patient tolerated treatment well    Behavior During Therapy WFL for tasks assessed/performed             Past Medical History:  Diagnosis Date   Anxiety    BPH (benign prostatic hyperplasia)    Depression    Hyperlipidemia    Hypertension    Testosterone deficiency     Past Surgical History:  Procedure Laterality Date   FOOT SURGERY     HERNIA REPAIR Left inguinal    There were no vitals filed for this visit.   Subjective Assessment - 05/04/21 1033     Subjective COVID-19 screen performed prior to patient entering clinic. Patient reports that he still has moderate pain in his knee.    Pertinent History Prior right foot surgery, HTN, hernia repair.    How long can you walk comfortably? Around home with walker.    Patient Stated Goals Get back to normal life.    Currently in Pain? Yes    Pain Score 3     Pain Orientation Left;Medial    Pain Type Surgical pain    Pain Onset More than a month ago                               [facility] Adult PT Treatment/Exercise - 05/04/21 0001       Exercises   Exercises Knee/Hip      Knee/Hip Exercises: Aerobic   Nustep L7 x 15 minutes; seat 9      Knee/Hip Exercises: Standing   Forward Lunges Left   2 minutes   Rocker Board 4 minutes      Modalities    Modalities Vasopneumatic      Vasopneumatic   Number Minutes Vasopneumatic  15 minutes    Vasopnuematic Location  Knee    Vasopneumatic Pressure Low    Vasopneumatic Temperature  34      Manual Therapy   Manual Therapy Joint mobilization;Soft tissue mobilization;Passive ROM    Joint Mobilization Tibiofemoral grade I-III for knee extension    Soft tissue mobilization to left quad, IT band, and distal hip adductors    Passive ROM flexion and extension to tolerance                          PT Long Term Goals - 05/02/21 1201       PT LONG TERM GOAL #1   Title Independent with a HEP.    Period Weeks    Status New      PT LONG TERM GOAL #2   Title Perform ADL's with pain not > 3/10.    Time 6    Period  Weeks    Status New      PT LONG TERM GOAL #3   Title Full active left knee extension in order to normalize gait.    Time 6    Period Weeks    Status New      PT LONG TERM GOAL #4   Title Active knee flexion to 120 degrees+ so the patient can perform functional tasks and do so with pain not > 2-3/10.    Time 6    Period Weeks    Status New      PT LONG TERM GOAL #5   Title Perform a reciprocating stair gait with one railing with pain not > 2-3/10.    Time 6    Period Weeks    Status New                   Plan - 05/04/21 1035     Clinical Impression Statement Patient was progressed with multiple new interventions for improved knee mobility. He required minimal cuing with today's new interventions for proper pacing to avoid a significant increase in his familiar symptoms. Manual therapy focused on improved knee mobility through the use of soft tissue mobilization, tibiofemoral joint mobilizations, and PROM. He reported that his knee did not feel any worse upon the conclusion of treatment. He continues to require skilled physical therapy to address his remaining impairments to return to his prior level of function.    Personal Factors and  Comorbidities Comorbidity 1;Other    Comorbidities Prior right foot surgery, HTN, hearnia repair.    Examination-Activity Limitations Other;Locomotion Level    Examination-Participation Restrictions Other    Stability/Clinical Decision Making Stable/Uncomplicated    Rehab Potential Excellent    PT Frequency 3x / week    PT Duration --   4-6 weeks.   PT Treatment/Interventions ADLs/Self Care Home Management;Cryotherapy;Electrical Stimulation;Moist Heat;Iontophoresis 4mg /ml Dexamethasone;Gait training;Functional mobility training;Therapeutic exercise;Neuromuscular re-education;Therapeutic activities;Patient/family education;Manual techniques;Vasopneumatic Device    PT Next Visit Plan Nustep with progression to recumbent bike.  Progress per TKA protocol.  Vasopneumatic.    Consulted and Agree with Plan of Care Patient             Patient will benefit from skilled therapeutic intervention in order to improve the following deficits and impairments:  Pain, Abnormal gait, Decreased range of motion, Increased edema, Decreased activity tolerance  Visit Diagnosis: Chronic pain of left knee  Localized edema  Stiffness of left knee, not elsewhere classified     Problem List Patient Active Problem List   Diagnosis Date Noted   Preop examination 01/11/2021   Back pain 06/24/2020   Dysuria 06/24/2020   Tendonitis, Achilles, right 05/27/2019   Controlled substance agreement signed 11/22/2018   Blood glucose elevated 11/22/2018   Vitamin D deficiency 11/22/2018   Sensorineural hearing loss (SNHL), bilateral 06/17/2018   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   Mixed dyslipidemia 08/08/2017   Abnormal EKG 08/08/2017   Cervical somatic dysfunction 03/06/2016   Hemorrhoid thrombosis 03/06/2016   Generalized anxiety disorder 03/06/2016   Left knee pain 11/05/2014   Allergic rhinitis 06/18/2013   Morbid obesity ([place]) 06/18/2013   BPH (benign prostatic hypertrophy) 07/03/2012    Essential hypertension, benign 07/03/2012   Depression, recurrent ([place]) 07/03/2012   GERD (gastroesophageal reflux disease) 07/03/2012   Testosterone deficiency 07/03/2012    Darlin Coco, PT 05/04/2021, 12:39 PM  [facility] [address] [place], [place], [address] Phone: [phone]   Fax:  [phone]  Name: Arthur Foster MRN: [id number] Date of Birth: [birth year]

## 2021-05-05 DIAGNOSIS — Z20822 Contact with and (suspected) exposure to covid-19: Secondary | ICD-10-CM | POA: Diagnosis not present

## 2021-05-06 ENCOUNTER — Encounter: Payer: Self-pay | Admitting: Physical Therapy

## 2021-05-06 ENCOUNTER — Ambulatory Visit: Payer: Medicare Other | Admitting: Physical Therapy

## 2021-05-06 ENCOUNTER — Other Ambulatory Visit: Payer: Self-pay

## 2021-05-06 DIAGNOSIS — G8929 Other chronic pain: Secondary | ICD-10-CM

## 2021-05-06 DIAGNOSIS — M25562 Pain in left knee: Secondary | ICD-10-CM | POA: Diagnosis not present

## 2021-05-06 DIAGNOSIS — R600 Localized edema: Secondary | ICD-10-CM

## 2021-05-06 DIAGNOSIS — M25662 Stiffness of left knee, not elsewhere classified: Secondary | ICD-10-CM | POA: Diagnosis not present

## 2021-05-06 NOTE — Therapy (Signed)
[facility] [place], [place], [address] Phone: [phone]   Fax:  [phone]  Physical Therapy Treatment  Patient Details  Name: Arthur Foster MRN: [id number] Date of Birth: 1950/08/26 Referring Provider (PT): Edmonia Lynch MD   Encounter Date: 05/06/2021   PT End of Session - 05/06/21 1316     Visit Number 3    Number of Visits 12    Date for PT Re-Evaluation 06/13/21    Authorization Type FOTO AT LEAST EVERY 5TH VISIT.  PROGRESS NOTE AT 10TH VISIT.  KX MODIFIER AFTER 15 VISITS.    PT Start Time 0900    PT Stop Time 0949    PT Time Calculation (min) 49 min    Activity Tolerance Patient tolerated treatment well    Behavior During Therapy WFL for tasks assessed/performed             Past Medical History:  Diagnosis Date   Anxiety    BPH (benign prostatic hyperplasia)    Depression    Hyperlipidemia    Hypertension    Testosterone deficiency     Past Surgical History:  Procedure Laterality Date   FOOT SURGERY     HERNIA REPAIR Left inguinal    There were no vitals filed for this visit.   Subjective Assessment - 05/06/21 1318     Subjective COVID-19 screen performed prior to patient entering clinic.  Hurt a lot after last treatment.    Pertinent History Prior right foot surgery, HTN, hernia repair.    How long can you walk comfortably? Around home with walker.    Patient Stated Goals Get back to normal life.    Currently in Pain? Yes    Pain Score 5     Pain Location Knee    Pain Orientation Left    Pain Descriptors / Indicators Aching;Throbbing    Pain Type Surgical pain    Pain Onset More than a month ago                               [facility] Adult PT Treatment/Exercise - 05/06/21 0001       Exercises   Exercises Knee/Hip      Knee/Hip Exercises: Aerobic   Recumbent Bike 10 minutes.    Nustep Level 3 x 8 minutes.      Vasopneumatic   Number Minutes Vasopneumatic  15  minutes    Vasopnuematic Location  --   Left knee.   Vasopneumatic Pressure Medium      Manual Therapy   Passive ROM In supine:  Left knee PROM x 5 minutes into flexion and extension.                          PT Long Term Goals - 05/02/21 1201       PT LONG TERM GOAL #1   Title Independent with a HEP.    Period Weeks    Status New      PT LONG TERM GOAL #2   Title Perform ADL's with pain not > 3/10.    Time 6    Period Weeks    Status New      PT LONG TERM GOAL #3   Title Full active left knee extension in order to normalize gait.    Time 6    Period Weeks    Status New  PT LONG TERM GOAL #4   Title Active knee flexion to 120 degrees+ so the patient can perform functional tasks and do so with pain not > 2-3/10.    Time 6    Period Weeks    Status New      PT LONG TERM GOAL #5   Title Perform a reciprocating stair gait with one railing with pain not > 2-3/10.    Time 6    Period Weeks    Status New                   Plan - 05/06/21 1330     Clinical Impression Statement Patient in a lot of pain after last treatment but doing better today.  Excellent progress with progression to recumbent bike today.    Personal Factors and Comorbidities Comorbidity 1;Other    Comorbidities Prior right foot surgery, HTN, hearnia repair.    Examination-Activity Limitations Other;Locomotion Level    Examination-Participation Restrictions Other    Stability/Clinical Decision Making Stable/Uncomplicated    Rehab Potential Excellent    PT Frequency 3x / week    PT Treatment/Interventions ADLs/Self Care Home Management;Cryotherapy;Electrical Stimulation;Moist Heat;Iontophoresis 4mg /ml Dexamethasone;Gait training;Functional mobility training;Therapeutic exercise;Neuromuscular re-education;Therapeutic activities;Patient/family education;Manual techniques;Vasopneumatic Device    PT Next Visit Plan Nustep with progression to recumbent bike.  Progress per TKA  protocol.  Vasopneumatic.    Consulted and Agree with Plan of Care Patient             Patient will benefit from skilled therapeutic intervention in order to improve the following deficits and impairments:  Pain, Abnormal gait, Decreased range of motion, Increased edema, Decreased activity tolerance  Visit Diagnosis: Chronic pain of left knee  Localized edema  Stiffness of left knee, not elsewhere classified     Problem List Patient Active Problem List   Diagnosis Date Noted   Preop examination 01/11/2021   Back pain 06/24/2020   Dysuria 06/24/2020   Tendonitis, Achilles, right 05/27/2019   Controlled substance agreement signed 11/22/2018   Blood glucose elevated 11/22/2018   Vitamin D deficiency 11/22/2018   Sensorineural hearing loss (SNHL), bilateral 06/17/2018   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   Mixed dyslipidemia 08/08/2017   Abnormal EKG 08/08/2017   Cervical somatic dysfunction 03/06/2016   Hemorrhoid thrombosis 03/06/2016   Generalized anxiety disorder 03/06/2016   Left knee pain 11/05/2014   Allergic rhinitis 06/18/2013   Morbid obesity ([place]) 06/18/2013   BPH (benign prostatic hypertrophy) 07/03/2012   Essential hypertension, benign 07/03/2012   Depression, recurrent ([place]) 07/03/2012   GERD (gastroesophageal reflux disease) 07/03/2012   Testosterone deficiency 07/03/2012    Calayah Guadarrama, [place], PT 05/06/2021, 1:33 PM  [facility] [address] [place], [place], [address] Phone: [phone]   Fax:  [phone]  Name: Arthur Foster MRN: [id number] Date of Birth: 30-Apr-1950

## 2021-05-10 ENCOUNTER — Other Ambulatory Visit: Payer: Self-pay

## 2021-05-10 ENCOUNTER — Ambulatory Visit: Payer: Medicare Other

## 2021-05-10 DIAGNOSIS — G8929 Other chronic pain: Secondary | ICD-10-CM

## 2021-05-10 DIAGNOSIS — M25562 Pain in left knee: Secondary | ICD-10-CM

## 2021-05-10 DIAGNOSIS — M25662 Stiffness of left knee, not elsewhere classified: Secondary | ICD-10-CM

## 2021-05-10 DIAGNOSIS — R600 Localized edema: Secondary | ICD-10-CM | POA: Diagnosis not present

## 2021-05-10 NOTE — Therapy (Signed)
[facility] [place], [place], [address] Phone: [phone]   Fax:  [phone]  Physical Therapy Treatment  Patient Details  Name: Arthur Foster MRN: [id number] Date of Birth: 11/08/1950 Referring Provider (PT): Edmonia Lynch MD   Encounter Date: 05/10/2021   PT End of Session - 05/10/21 0950     Visit Number 4    Number of Visits 12    Date for PT Re-Evaluation 06/13/21    Authorization Type FOTO AT LEAST EVERY 5TH VISIT.  PROGRESS NOTE AT 10TH VISIT.  KX MODIFIER AFTER 15 VISITS.    PT Start Time 0945    PT Stop Time 9449    PT Time Calculation (min) 59 min    Activity Tolerance Patient tolerated treatment well    Behavior During Therapy WFL for tasks assessed/performed             Past Medical History:  Diagnosis Date   Anxiety    BPH (benign prostatic hyperplasia)    Depression    Hyperlipidemia    Hypertension    Testosterone deficiency     Past Surgical History:  Procedure Laterality Date   FOOT SURGERY     HERNIA REPAIR Left inguinal    There were no vitals filed for this visit.   Subjective Assessment - 05/10/21 0946     Subjective COVID-19 screen performed prior to patient entering clinic.  He reports that his knee does not feel too bad right now. He notes that it has been easier to sleep the last three nights.    Pertinent History Prior right foot surgery, HTN, hernia repair.    How long can you walk comfortably? Around home with walker.    Patient Stated Goals Get back to normal life.    Currently in Pain? Yes    Pain Score 2     Pain Location Knee    Pain Orientation Left    Pain Onset More than a month ago                [facility] PT Assessment - 05/10/21 0001       AROM   AROM Assessment Site Knee    Right/Left Knee Left    Left Knee Extension 3    Left Knee Flexion 118                           [facility] Adult PT Treatment/Exercise - 05/10/21 0001        Knee/Hip Exercises: Aerobic   Recumbent Bike L4 x 15 minutes; seat 11      Knee/Hip Exercises: Standing   Rocker Board 2 minutes      Modalities   Modalities Vasopneumatic      Vasopneumatic   Vasopnuematic Location  Knee    Vasopneumatic Pressure Low    Vasopneumatic Temperature  34      Manual Therapy   Manual Therapy Soft tissue mobilization;Passive ROM    Soft tissue mobilization to left quad, IT band, and distal hip adductors    Passive ROM in supine; flexion and extension to tolerance                 Balance Exercises - 05/10/21 0001       Balance Exercises: Standing   Tandem Stance Foam/compliant surface;Intermittent upper extremity support;4 reps;30 secs    Sidestepping Foam/compliant support;Upper extremity support   2 minutes   Marching Foam/compliant surface;Upper extremity assist 2;Static  1.5 minutes; limited by left knee pain                    PT Long Term Goals - 05/02/21 1201       PT LONG TERM GOAL #1   Title Independent with a HEP.    Period Weeks    Status New      PT LONG TERM GOAL #2   Title Perform ADL's with pain not > 3/10.    Time 6    Period Weeks    Status New      PT LONG TERM GOAL #3   Title Full active left knee extension in order to normalize gait.    Time 6    Period Weeks    Status New      PT LONG TERM GOAL #4   Title Active knee flexion to 120 degrees+ so the patient can perform functional tasks and do so with pain not > 2-3/10.    Time 6    Period Weeks    Status New      PT LONG TERM GOAL #5   Title Perform a reciprocating stair gait with one railing with pain not > 2-3/10.    Time 6    Period Weeks    Status New                   Plan - 05/10/21 0950     Clinical Impression Statement Patient was introduced to multiple new interventions for improved left knee stability with moderate difficulty. He required minimal cuing with the rockerboard for slow and controlled mobility to  facilitate improved gastroc mobility. He experienced increased difficulty with today's balance interventions as he required intermittent upper extremity support. He experienced a mild increase in his familiar left knee pain with hip flexion while marching on the BOSU. However, this was able to be reduced with sof tissue mobilization to the distal hip adductors. He reported that his knee felt alright upon the conclusion of treatment. Patient would benefit from continued physical therapy to address his remaining impairments to return to his prior level of function.    Personal Factors and Comorbidities Comorbidity 1;Other    Comorbidities Prior right foot surgery, HTN, hearnia repair.    Examination-Activity Limitations Other;Locomotion Level    Examination-Participation Restrictions Other    Stability/Clinical Decision Making Stable/Uncomplicated    Rehab Potential Excellent    PT Frequency 3x / week    PT Treatment/Interventions ADLs/Self Care Home Management;Cryotherapy;Electrical Stimulation;Moist Heat;Iontophoresis 4mg /ml Dexamethasone;Gait training;Functional mobility training;Therapeutic exercise;Neuromuscular re-education;Therapeutic activities;Patient/family education;Manual techniques;Vasopneumatic Device    PT Next Visit Plan Nustep with progression to recumbent bike.  Progress per TKA protocol.  Vasopneumatic.    Consulted and Agree with Plan of Care Patient             Patient will benefit from skilled therapeutic intervention in order to improve the following deficits and impairments:  Pain, Abnormal gait, Decreased range of motion, Increased edema, Decreased activity tolerance  Visit Diagnosis: Chronic pain of left knee  Localized edema  Stiffness of left knee, not elsewhere classified     Problem List Patient Active Problem List   Diagnosis Date Noted   Preop examination 01/11/2021   Back pain 06/24/2020   Dysuria 06/24/2020   Tendonitis, Achilles, right 05/27/2019    Controlled substance agreement signed 11/22/2018   Blood glucose elevated 11/22/2018   Vitamin D deficiency 11/22/2018   Sensorineural hearing loss (SNHL), bilateral 06/17/2018  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   Mixed dyslipidemia 08/08/2017   Abnormal EKG 08/08/2017   Cervical somatic dysfunction 03/06/2016   Hemorrhoid thrombosis 03/06/2016   Generalized anxiety disorder 03/06/2016   Left knee pain 11/05/2014   Allergic rhinitis 06/18/2013   Morbid obesity ([place]) 06/18/2013   BPH (benign prostatic hypertrophy) 07/03/2012   Essential hypertension, benign 07/03/2012   Depression, recurrent ([place]) 07/03/2012   GERD (gastroesophageal reflux disease) 07/03/2012   Testosterone deficiency 07/03/2012    Darlin Coco, PT 05/10/2021, 12:17 PM  [facility] [address] [place], [place], [address] Phone: [phone]   Fax:  [phone]  Name: Arthur Foster MRN: [id number] Date of Birth: [birth year]

## 2021-05-11 DIAGNOSIS — M1712 Unilateral primary osteoarthritis, left knee: Secondary | ICD-10-CM | POA: Diagnosis not present

## 2021-05-12 ENCOUNTER — Other Ambulatory Visit: Payer: Self-pay

## 2021-05-12 ENCOUNTER — Ambulatory Visit: Payer: Medicare Other | Admitting: Physical Therapy

## 2021-05-12 DIAGNOSIS — M25662 Stiffness of left knee, not elsewhere classified: Secondary | ICD-10-CM | POA: Diagnosis not present

## 2021-05-12 DIAGNOSIS — G8929 Other chronic pain: Secondary | ICD-10-CM

## 2021-05-12 DIAGNOSIS — R600 Localized edema: Secondary | ICD-10-CM | POA: Diagnosis not present

## 2021-05-12 DIAGNOSIS — M25562 Pain in left knee: Secondary | ICD-10-CM

## 2021-05-12 NOTE — Therapy (Signed)
[facility] [place], [place], [address] Phone: [phone]   Fax:  [phone]  Physical Therapy Treatment  Patient Details  Name: Arthur Foster MRN: [id number] Date of Birth: 1950/09/06 Referring Provider (PT): Edmonia Lynch MD   Encounter Date: 05/12/2021   PT End of Session - 05/12/21 1143     Visit Number 5    Number of Visits 12    Date for PT Re-Evaluation 06/13/21    Authorization Type FOTO AT LEAST EVERY 5TH VISIT.  PROGRESS NOTE AT 10TH VISIT.  KX MODIFIER AFTER 15 VISITS.    PT Start Time 0945    PT Stop Time 1029    PT Time Calculation (min) 44 min    Activity Tolerance Patient tolerated treatment well    Behavior During Therapy WFL for tasks assessed/performed             Past Medical History:  Diagnosis Date   Anxiety    BPH (benign prostatic hyperplasia)    Depression    Hyperlipidemia    Hypertension    Testosterone deficiency     Past Surgical History:  Procedure Laterality Date   FOOT SURGERY     HERNIA REPAIR Left inguinal    There were no vitals filed for this visit.   Subjective Assessment - 05/12/21 1144     Subjective COVID-19 screen performed prior to patient entering clinic.  Surgeon was very impressed with progress.  Patient states he did increase his pain some by attempting to put on his TED hose.    Pertinent History Prior right foot surgery, HTN, hernia repair.    How long can you walk comfortably? Around home with walker.    Patient Stated Goals Get back to normal life.    Currently in Pain? Yes    Pain Score 4     Pain Location Knee    Pain Orientation Left    Pain Descriptors / Indicators Aching;Throbbing    Pain Type Surgical pain    Pain Onset More than a month ago                [facility] PT Assessment - 05/12/21 0001       AROM   Left Knee Flexion 120                           [facility] Adult PT Treatment/Exercise - 05/12/21 0001        Exercises   Exercises Knee/Hip      Knee/Hip Exercises: Aerobic   Recumbent Bike Level 3 x 15 minutes moving seat forward x 2 to increase flexion.      Modalities   Modalities Electrical Stimulation;Vasopneumatic      Electrical Stimulation   Electrical Stimulation Location Left medial knee.    Electrical Stimulation Action Pre-mod. at 80-150 Hz x 15 minutes.      Vasopneumatic   Number Minutes Vasopneumatic  15 minutes    Vasopnuematic Location  --   Left knee.   Vasopneumatic Pressure Medium      Manual Therapy   Manual Therapy Passive ROM    Passive ROM In supine:  PROM into flexion and extension x 8 minutes.                          PT Long Term Goals - 05/02/21 1201       PT LONG TERM GOAL #1  Title Independent with a HEP.    Period Weeks    Status New      PT LONG TERM GOAL #2   Title Perform ADL's with pain not > 3/10.    Time 6    Period Weeks    Status New      PT LONG TERM GOAL #3   Title Full active left knee extension in order to normalize gait.    Time 6    Period Weeks    Status New      PT LONG TERM GOAL #4   Title Active knee flexion to 120 degrees+ so the patient can perform functional tasks and do so with pain not > 2-3/10.    Time 6    Period Weeks    Status New      PT LONG TERM GOAL #5   Title Perform a reciprocating stair gait with one railing with pain not > 2-3/10.    Time 6    Period Weeks    Status New                   Plan - 05/12/21 1154     Clinical Impression Statement The patient is making excellent progress.  His left knee flexion has improved to 120 degrees.  He had increased left knee medial joint line pain today due to bending his left hip and knee to attempt putting on his TED hose.    Personal Factors and Comorbidities Comorbidity 1;Other    Comorbidities Prior right foot surgery, HTN, hearnia repair.    Examination-Activity Limitations Other;Locomotion Level    Examination-Participation  Restrictions Other    Stability/Clinical Decision Making Stable/Uncomplicated    Rehab Potential Excellent    PT Frequency 3x / week    PT Treatment/Interventions ADLs/Self Care Home Management;Cryotherapy;Electrical Stimulation;Moist Heat;Iontophoresis 4mg /ml Dexamethasone;Gait training;Functional mobility training;Therapeutic exercise;Neuromuscular re-education;Therapeutic activities;Patient/family education;Manual techniques;Vasopneumatic Device    PT Next Visit Plan Nustep with progression to recumbent bike.  Progress per TKA protocol.  Vasopneumatic.    Consulted and Agree with Plan of Care Patient             Patient will benefit from skilled therapeutic intervention in order to improve the following deficits and impairments:  Pain, Abnormal gait, Decreased range of motion, Increased edema, Decreased activity tolerance  Visit Diagnosis: Chronic pain of left knee  Localized edema  Stiffness of left knee, not elsewhere classified     Problem List Patient Active Problem List   Diagnosis Date Noted   Preop examination 01/11/2021   Back pain 06/24/2020   Dysuria 06/24/2020   Tendonitis, Achilles, right 05/27/2019   Controlled substance agreement signed 11/22/2018   Blood glucose elevated 11/22/2018   Vitamin D deficiency 11/22/2018   Sensorineural hearing loss (SNHL), bilateral 06/17/2018   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 [place], [place], [address] Phone: [phone]   Fax:  [phone]  Name: Arthur Foster MRN: [id number] Date of  Birth: 14-May-1950

## 2021-05-17 ENCOUNTER — Ambulatory Visit: Payer: Medicare Other | Admitting: Physical Therapy

## 2021-05-17 ENCOUNTER — Other Ambulatory Visit: Payer: Self-pay

## 2021-05-17 DIAGNOSIS — M25662 Stiffness of left knee, not elsewhere classified: Secondary | ICD-10-CM | POA: Diagnosis not present

## 2021-05-17 DIAGNOSIS — R600 Localized edema: Secondary | ICD-10-CM

## 2021-05-17 DIAGNOSIS — G8929 Other chronic pain: Secondary | ICD-10-CM

## 2021-05-17 DIAGNOSIS — M25562 Pain in left knee: Secondary | ICD-10-CM | POA: Diagnosis not present

## 2021-05-17 NOTE — Therapy (Signed)
[facility] [place], [place], [address] Phone: [phone]   Fax:  [phone]  Physical Therapy Treatment  Patient Details  Name: Arthur Foster MRN: [id number] Date of Birth: 05-Sep-1950 Referring Provider (PT): Edmonia Lynch MD   Encounter Date: 05/17/2021   PT End of Session - 05/17/21 1246     Visit Number 6    Number of Visits 12    Date for PT Re-Evaluation 06/13/21    Authorization Type FOTO AT LEAST EVERY 5TH VISIT.  PROGRESS NOTE AT 10TH VISIT.  KX MODIFIER AFTER 15 VISITS.    PT Start Time 0900    PT Stop Time 0950    PT Time Calculation (min) 50 min    Activity Tolerance Patient tolerated treatment well    Behavior During Therapy WFL for tasks assessed/performed             Past Medical History:  Diagnosis Date   Anxiety    BPH (benign prostatic hyperplasia)    Depression    Hyperlipidemia    Hypertension    Testosterone deficiency     Past Surgical History:  Procedure Laterality Date   FOOT SURGERY     HERNIA REPAIR Left inguinal    There were no vitals filed for this visit.   Subjective Assessment - 05/17/21 1245     Subjective Overdid it yesterday being on feet 5 hours but better today.    Pertinent History Prior right foot surgery, HTN, hernia repair.    How long can you walk comfortably? Around home with walker.    Patient Stated Goals Get back to normal life.    Currently in Pain? Yes    Pain Location Knee    Pain Orientation Left    Pain Descriptors / Indicators Aching;Throbbing    Pain Onset More than a month ago                               [facility] Adult PT Treatment/Exercise - 05/17/21 0001       Exercises   Exercises Knee/Hip      Knee/Hip Exercises: Aerobic   Recumbent Bike Level 3 progressing to seat 7 over 15 minutes.      Knee/Hip Exercises: Machines for Strengthening   Cybex Knee Extension 10# x 3 minutes.    Cybex Knee Flexion 30# x 3  minutes.      Vasopneumatic   Number Minutes Vasopneumatic  15 minutes    Vasopnuematic Location  --   Left knee.   Vasopneumatic Pressure Low      Manual Therapy   Manual Therapy Passive ROM    Passive ROM In supine:  PROM x 5 minutes into left knee flexion and extension to patient tolerance.                          PT Long Term Goals - 05/02/21 1201       PT LONG TERM GOAL #1   Title Independent with a HEP.    Period Weeks    Status New      PT LONG TERM GOAL #2   Title Perform ADL's with pain not > 3/10.    Time 6    Period Weeks    Status New      PT LONG TERM GOAL #3   Title Full active left knee extension in order to normalize gait.  Time 6    Period Weeks    Status New      PT LONG TERM GOAL #4   Title Active knee flexion to 120 degrees+ so the patient can perform functional tasks and do so with pain not > 2-3/10.    Time 6    Period Weeks    Status New      PT LONG TERM GOAL #5   Title Perform a reciprocating stair gait with one railing with pain not > 2-3/10.    Time 6    Period Weeks    Status New                   Plan - 05/17/21 1248     Clinical Impression Statement Patient was up a lot over the weekend and his left knee pain beacme very high.  Today, his pain is down.  He was able to tolerate weight machines with no complaint and performed with excellent technique.    Personal Factors and Comorbidities Comorbidity 1;Other    Comorbidities Prior right foot surgery, HTN, hernia repair.    Examination-Activity Limitations Other;Locomotion Level    Stability/Clinical Decision Making Stable/Uncomplicated    Rehab Potential Excellent    PT Frequency 3x / week    PT Treatment/Interventions ADLs/Self Care Home Management;Cryotherapy;Electrical Stimulation;Moist Heat;Iontophoresis 4mg /ml Dexamethasone;Gait training;Functional mobility training;Therapeutic exercise;Neuromuscular re-education;Therapeutic activities;Patient/family  education;Manual techniques;Vasopneumatic Device    PT Next Visit Plan Nustep with progression to recumbent bike.  Progress per TKA protocol.  Vasopneumatic.  Weighted quad and hamstring machines.    Consulted and Agree with Plan of Care Patient             Patient will benefit from skilled therapeutic intervention in order to improve the following deficits and impairments:  Pain, Abnormal gait, Decreased range of motion, Increased edema, Decreased activity tolerance  Visit Diagnosis: Chronic pain of left knee  Localized edema  Stiffness of left knee, not elsewhere classified     Problem List Patient Active Problem List   Diagnosis Date Noted   Preop examination 01/11/2021   Back pain 06/24/2020   Dysuria 06/24/2020   Tendonitis, Achilles, right 05/27/2019   Controlled substance agreement signed 11/22/2018   Blood glucose elevated 11/22/2018   Vitamin D deficiency 11/22/2018   Sensorineural hearing loss (SNHL), bilateral 06/17/2018   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   Mixed dyslipidemia 08/08/2017   Abnormal EKG 08/08/2017   Cervical somatic dysfunction 03/06/2016   Hemorrhoid thrombosis 03/06/2016   Generalized anxiety disorder 03/06/2016   Left knee pain 11/05/2014   Allergic rhinitis 06/18/2013   Morbid obesity ([place]) 06/18/2013   BPH (benign prostatic hypertrophy) 07/03/2012   Essential hypertension, benign 07/03/2012   Depression, recurrent ([place]) 07/03/2012   GERD (gastroesophageal reflux disease) 07/03/2012   Testosterone deficiency 07/03/2012    Kamariya Blevens, [place], PT 05/17/2021, 12:50 PM  [facility] [address] [place], [place], [address] Phone: [phone]   Fax:  [phone]  Name: Arthur Foster MRN: [id number] Date of Birth: [birth year]

## 2021-05-20 ENCOUNTER — Other Ambulatory Visit: Payer: Self-pay

## 2021-05-20 ENCOUNTER — Ambulatory Visit: Payer: Medicare Other | Admitting: Physical Therapy

## 2021-05-20 DIAGNOSIS — G8929 Other chronic pain: Secondary | ICD-10-CM | POA: Diagnosis not present

## 2021-05-20 DIAGNOSIS — R600 Localized edema: Secondary | ICD-10-CM | POA: Diagnosis not present

## 2021-05-20 DIAGNOSIS — M25662 Stiffness of left knee, not elsewhere classified: Secondary | ICD-10-CM

## 2021-05-20 DIAGNOSIS — M25562 Pain in left knee: Secondary | ICD-10-CM

## 2021-05-20 NOTE — Therapy (Signed)
[facility] [place], [place], [address] Phone: [phone]   Fax:  [phone]  Physical Therapy Treatment  Patient Details  Name: Arthur Foster MRN: [id number] Date of Birth: 1950-09-05 Referring Provider (PT): Edmonia Lynch MD   Encounter Date: 05/20/2021   PT End of Session - 05/20/21 0957     Visit Number 7    Number of Visits 12    Date for PT Re-Evaluation 06/13/21    Authorization Type FOTO AT LEAST EVERY 5TH VISIT.  PROGRESS NOTE AT 10TH VISIT.  KX MODIFIER AFTER 15 VISITS.    PT Start Time 0900    PT Stop Time 0946    PT Time Calculation (min) 46 min    Activity Tolerance Patient tolerated treatment well    Behavior During Therapy WFL for tasks assessed/performed             Past Medical History:  Diagnosis Date   Anxiety    BPH (benign prostatic hyperplasia)    Depression    Hyperlipidemia    Hypertension    Testosterone deficiency     Past Surgical History:  Procedure Laterality Date   FOOT SURGERY     HERNIA REPAIR Left inguinal    There were no vitals filed for this visit.   Subjective Assessment - 05/20/21 0957     Subjective Doing okay.    Pertinent History Prior right foot surgery, HTN, hernia repair.    How long can you walk comfortably? Around home with walker.    Patient Stated Goals Get back to normal life.    Currently in Pain? Yes    Pain Score 3     Pain Location Knee    Pain Orientation Left    Pain Descriptors / Indicators Aching;Throbbing    Pain Type Surgical pain    Pain Onset More than a month ago                               [facility] Adult PT Treatment/Exercise - 05/20/21 0001       Exercises   Exercises Knee/Hip      Knee/Hip Exercises: Aerobic   Recumbent Bike Level 3 x 15 minutes.      Knee/Hip Exercises: Machines for Strengthening   Cybex Knee Extension 10# x 3 minutes.    Cybex Knee Flexion 40# x 3 minutes.    Cybex Leg Press 2  plates x 3 minutes.      Modalities   Modalities Vasopneumatic      Vasopneumatic   Number Minutes Vasopneumatic  15 minutes    Vasopnuematic Location  --   Left knee.   Vasopneumatic Pressure Low                          PT Long Term Goals - 05/02/21 1201       PT LONG TERM GOAL #1   Title Independent with a HEP.    Period Weeks    Status New      PT LONG TERM GOAL #2   Title Perform ADL's with pain not > 3/10.    Time 6    Period Weeks    Status New      PT LONG TERM GOAL #3   Title Full active left knee extension in order to normalize gait.    Time 6    Period Weeks  Status New      PT LONG TERM GOAL #4   Title Active knee flexion to 120 degrees+ so the patient can perform functional tasks and do so with pain not > 2-3/10.    Time 6    Period Weeks    Status New      PT LONG TERM GOAL #5   Title Perform a reciprocating stair gait with one railing with pain not > 2-3/10.    Time 6    Period Weeks    Status New                   Plan - 05/20/21 1003     Clinical Impression Statement Patient did very well with the addition of the leg press today.  He is progressing well toward goals.    Personal Factors and Comorbidities Comorbidity 1;Other    Comorbidities Prior right foot surgery, HTN, hernia repair.    Examination-Activity Limitations Other;Locomotion Level    Examination-Participation Restrictions Other    Stability/Clinical Decision Making Stable/Uncomplicated    Rehab Potential Excellent    PT Frequency 3x / week    PT Treatment/Interventions ADLs/Self Care Home Management;Cryotherapy;Electrical Stimulation;Moist Heat;Iontophoresis 4mg /ml Dexamethasone;Gait training;Functional mobility training;Therapeutic exercise;Neuromuscular re-education;Therapeutic activities;Patient/family education;Manual techniques;Vasopneumatic Device    PT Next Visit Plan Nustep with progression to recumbent bike.  Progress per TKA protocol.   Vasopneumatic.  Weighted quad and hamstring machines and leg press.    Consulted and Agree with Plan of Care Patient             Patient will benefit from skilled therapeutic intervention in order to improve the following deficits and impairments:  Pain, Abnormal gait, Decreased range of motion, Increased edema, Decreased activity tolerance  Visit Diagnosis: Chronic pain of left knee  Localized edema  Stiffness of left knee, not elsewhere classified     Problem List Patient Active Problem List   Diagnosis Date Noted   Preop examination 01/11/2021   Back pain 06/24/2020   Dysuria 06/24/2020   Tendonitis, Achilles, right 05/27/2019   Controlled substance agreement signed 11/22/2018   Blood glucose elevated 11/22/2018   Vitamin D deficiency 11/22/2018   Sensorineural hearing loss (SNHL), bilateral 06/17/2018   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   Mixed dyslipidemia 08/08/2017   Abnormal EKG 08/08/2017   Cervical somatic dysfunction 03/06/2016   Hemorrhoid thrombosis 03/06/2016   Generalized anxiety disorder 03/06/2016   Left knee pain 11/05/2014   Allergic rhinitis 06/18/2013   Morbid obesity ([place]) 06/18/2013   BPH (benign prostatic hypertrophy) 07/03/2012   Essential hypertension, benign 07/03/2012   Depression, recurrent ([place]) 07/03/2012   GERD (gastroesophageal reflux disease) 07/03/2012   Testosterone deficiency 07/03/2012    Latanya Hemmer, [place], PT 05/20/2021, 11:13 AM  [facility] [address] [place], [place], [address] Phone: [phone]   Fax:  [phone]  Name: Arthur Foster MRN: [id number] Date of Birth: 31-Mar-1951

## 2021-05-23 ENCOUNTER — Ambulatory Visit: Payer: Medicare Other

## 2021-05-23 ENCOUNTER — Other Ambulatory Visit: Payer: Self-pay

## 2021-05-23 DIAGNOSIS — G8929 Other chronic pain: Secondary | ICD-10-CM

## 2021-05-23 DIAGNOSIS — M25662 Stiffness of left knee, not elsewhere classified: Secondary | ICD-10-CM

## 2021-05-23 DIAGNOSIS — R600 Localized edema: Secondary | ICD-10-CM | POA: Diagnosis not present

## 2021-05-23 DIAGNOSIS — M25562 Pain in left knee: Secondary | ICD-10-CM | POA: Diagnosis not present

## 2021-05-23 NOTE — Therapy (Signed)
[facility] [place], [place], [address] Phone: [phone]   Fax:  [phone]  Physical Therapy Treatment  Patient Details  Name: Arthur Foster MRN: [id number] Date of Birth: Jan 12, 1951 Referring Provider (PT): Edmonia Lynch MD   Encounter Date: 05/23/2021   PT End of Session - 05/23/21 2202     Visit Number 8    Number of Visits 12    Date for PT Re-Evaluation 06/13/21    Authorization Type FOTO AT LEAST EVERY 5TH VISIT.  PROGRESS NOTE AT 10TH VISIT.  KX MODIFIER AFTER 15 VISITS.    PT Start Time 0815    PT Stop Time 0919    PT Time Calculation (min) 64 min    Activity Tolerance Patient tolerated treatment well    Behavior During Therapy WFL for tasks assessed/performed             Past Medical History:  Diagnosis Date   Anxiety    BPH (benign prostatic hyperplasia)    Depression    Hyperlipidemia    Hypertension    Testosterone deficiency     Past Surgical History:  Procedure Laterality Date   FOOT SURGERY     HERNIA REPAIR Left inguinal    There were no vitals filed for this visit.   Subjective Assessment - 05/23/21 0808     Subjective Patient reports that he has begun to notice improvement in his knee as he is able to put his socks on easier. He notes that his medication is still helping with his pain.    Pertinent History Prior right foot surgery, HTN, hernia repair.    How long can you walk comfortably? Around home with walker.    Patient Stated Goals Get back to normal life.    Currently in Pain? No/denies    Pain Onset More than a month ago                [facility] PT Assessment - 05/23/21 0001       ROM / Strength   AROM / PROM / Strength PROM      AROM   Left Knee Flexion 118      PROM   PROM Assessment Site Knee    Right/Left Knee Left    Left Knee Flexion 125                           [facility] Adult PT Treatment/Exercise - 05/23/21 0001       Knee/Hip  Exercises: Aerobic   Recumbent Bike Level 3 x 15 minutes; seat 6-5      Knee/Hip Exercises: Machines for Strengthening   Cybex Knee Extension 30# x 3 minutes    Cybex Knee Flexion 50# x 3 minutes      Knee/Hip Exercises: Standing   Lateral Step Up Both;20 reps;Step Height: 6";Hand Hold: 2   focus on eccentric lower   Functional Squat Other (comment)   to standard height chair; 2 minutes     Modalities   Modalities Vasopneumatic      Vasopneumatic   Number Minutes Vasopneumatic  15 minutes    Vasopnuematic Location  Knee    Vasopneumatic Pressure Low    Vasopneumatic Temperature  34                     PT Education - 05/23/21 0909     Education Details ROM measurements, progress with therapy, expectations for healing, HEP  Person(s) Educated Patient    Methods Explanation    Comprehension Verbalized understanding                 PT Long Term Goals - 05/02/21 1201       PT LONG TERM GOAL #1   Title Independent with a HEP.    Period Weeks    Status New      PT LONG TERM GOAL #2   Title Perform ADL's with pain not > 3/10.    Time 6    Period Weeks    Status New      PT LONG TERM GOAL #3   Title Full active left knee extension in order to normalize gait.    Time 6    Period Weeks    Status New      PT LONG TERM GOAL #4   Title Active knee flexion to 120 degrees+ so the patient can perform functional tasks and do so with pain not > 2-3/10.    Time 6    Period Weeks    Status New      PT LONG TERM GOAL #5   Title Perform a reciprocating stair gait with one railing with pain not > 2-3/10.    Time 6    Period Weeks    Status New                   Plan - 05/23/21 2248     Clinical Impression Statement Patient was progressed with functional squats and lateral step ups in addition to familiar interventions with moderate difficulty and fatigue. He required minimal cuing with lateral step ups for improved eccentric control to  facilitate eccentric quadriceps control. He experienced no significant pain or discomfort with any of today's interventions. He reported that his knee felt good upon the conclusion of treatment. He continues to require skilled physical therapy to address his remaining impairments to return to his prior level of function.    Personal Factors and Comorbidities Comorbidity 1;Other    Comorbidities Prior right foot surgery, HTN, hernia repair.    Examination-Activity Limitations Other;Locomotion Level    Examination-Participation Restrictions Other    Stability/Clinical Decision Making Stable/Uncomplicated    Rehab Potential Excellent    PT Frequency 3x / week    PT Treatment/Interventions ADLs/Self Care Home Management;Cryotherapy;Electrical Stimulation;Moist Heat;Iontophoresis 4mg /ml Dexamethasone;Gait training;Functional mobility training;Therapeutic exercise;Neuromuscular re-education;Therapeutic activities;Patient/family education;Manual techniques;Vasopneumatic Device    PT Next Visit Plan Nustep with progression to recumbent bike.  Progress per TKA protocol.  Vasopneumatic.  Weighted quad and hamstring machines and leg press.    Consulted and Agree with Plan of Care Patient             Patient will benefit from skilled therapeutic intervention in order to improve the following deficits and impairments:  Pain, Abnormal gait, Decreased range of motion, Increased edema, Decreased activity tolerance  Visit Diagnosis: Chronic pain of left knee  Localized edema  Stiffness of left knee, not elsewhere classified     Problem List Patient Active Problem List   Diagnosis Date Noted   Preop examination 01/11/2021   Back pain 06/24/2020   Dysuria 06/24/2020   Tendonitis, Achilles, right 05/27/2019   Controlled substance agreement signed 11/22/2018   Blood glucose elevated 11/22/2018   Vitamin D deficiency 11/22/2018   Sensorineural hearing loss (SNHL), bilateral 06/17/2018   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 [place], [place], [address] Phone: [phone]   Fax:  [phone]  Name: Arthur Foster MRN: [id number] Date of Birth: [birth year]

## 2021-05-25 ENCOUNTER — Encounter: Payer: No Typology Code available for payment source | Admitting: Physical Therapy

## 2021-05-27 ENCOUNTER — Ambulatory Visit: Payer: Medicare Other | Admitting: Physical Therapy

## 2021-05-27 ENCOUNTER — Other Ambulatory Visit: Payer: Self-pay

## 2021-05-27 DIAGNOSIS — R600 Localized edema: Secondary | ICD-10-CM | POA: Diagnosis not present

## 2021-05-27 DIAGNOSIS — M25662 Stiffness of left knee, not elsewhere classified: Secondary | ICD-10-CM | POA: Diagnosis not present

## 2021-05-27 DIAGNOSIS — G8929 Other chronic pain: Secondary | ICD-10-CM | POA: Diagnosis not present

## 2021-05-27 DIAGNOSIS — M25562 Pain in left knee: Secondary | ICD-10-CM | POA: Diagnosis not present

## 2021-05-27 NOTE — Therapy (Signed)
[facility] [place], [place], [address] Phone: [phone]   Fax:  [phone]  Physical Therapy Treatment  Patient Details  Name: Arthur Foster MRN: [id number] Date of Birth: 1950/09/30 Referring Provider (PT): Edmonia Lynch MD   Encounter Date: 05/27/2021   PT End of Session - 05/27/21 0931     Visit Number 9    Number of Visits 12    Date for PT Re-Evaluation 06/13/21    Authorization Type FOTO AT LEAST EVERY 5TH VISIT.  PROGRESS NOTE AT 10TH VISIT.  KX MODIFIER AFTER 15 VISITS.    PT Start Time 0815    PT Stop Time 0901    PT Time Calculation (min) 46 min    Activity Tolerance Patient tolerated treatment well    Behavior During Therapy WFL for tasks assessed/performed             Past Medical History:  Diagnosis Date   Anxiety    BPH (benign prostatic hyperplasia)    Depression    Hyperlipidemia    Hypertension    Testosterone deficiency     Past Surgical History:  Procedure Laterality Date   FOOT SURGERY     HERNIA REPAIR Left inguinal    There were no vitals filed for this visit.   Subjective Assessment - 05/27/21 0932     Subjective Very sore from last treatment.  He wants to go easy today.    Pertinent History Prior right foot surgery, HTN, hernia repair.    How long can you walk comfortably? Around home with walker.    Patient Stated Goals Get back to normal life.    Currently in Pain? Yes    Pain Score 5     Pain Location Knee    Pain Orientation Left    Pain Descriptors / Indicators Sore    Pain Onset More than a month ago                               [facility] Adult PT Treatment/Exercise - 05/27/21 0001       Exercises   Exercises Knee/Hip      Knee/Hip Exercises: Aerobic   Recumbent Bike Level 3 x 15 minutes.      Vasopneumatic   Number Minutes Vasopneumatic  15 minutes    Vasopnuematic Location  --   Left knee.   Vasopneumatic Pressure Low      Manual  Therapy   Manual Therapy Passive ROM    Passive ROM In supine:  PROM x 8 minutes into left knee flexion and extension with low load long duration stretching technique utilized.                          PT Long Term Goals - 05/02/21 1201       PT LONG TERM GOAL #1   Title Independent with a HEP.    Period Weeks    Status New      PT LONG TERM GOAL #2   Title Perform ADL's with pain not > 3/10.    Time 6    Period Weeks    Status New      PT LONG TERM GOAL #3   Title Full active left knee extension in order to normalize gait.    Time 6    Period Weeks    Status New      PT  LONG TERM GOAL #4   Title Active knee flexion to 120 degrees+ so the patient can perform functional tasks and do so with pain not > 2-3/10.    Time 6    Period Weeks    Status New      PT LONG TERM GOAL #5   Title Perform a reciprocating stair gait with one railing with pain not > 2-3/10.    Time 6    Period Weeks    Status New                   Plan - 05/27/21 4696     Clinical Impression Statement The patient was very sore from last treatment and requested an easier treatment today.  He did well with treatment today and felt better following.    Personal Factors and Comorbidities Comorbidity 1;Other    Comorbidities Prior right foot surgery, HTN, hernia repair.    Examination-Activity Limitations Other;Locomotion Level    Examination-Participation Restrictions Other    Stability/Clinical Decision Making Stable/Uncomplicated    Rehab Potential Excellent    PT Frequency 3x / week    PT Treatment/Interventions ADLs/Self Care Home Management;Cryotherapy;Electrical Stimulation;Moist Heat;Iontophoresis 4mg /ml Dexamethasone;Gait training;Functional mobility training;Therapeutic exercise;Neuromuscular re-education;Therapeutic activities;Patient/family education;Manual techniques;Vasopneumatic Device    PT Next Visit Plan Nustep with progression to recumbent bike.  Progress per TKA  protocol.  Vasopneumatic.  Weighted quad and hamstring machines and leg press.    Consulted and Agree with Plan of Care Patient             Patient will benefit from skilled therapeutic intervention in order to improve the following deficits and impairments:  Pain, Abnormal gait, Decreased range of motion, Increased edema, Decreased activity tolerance  Visit Diagnosis: Chronic pain of left knee  Localized edema  Stiffness of left knee, not elsewhere classified     Problem List Patient Active Problem List   Diagnosis Date Noted   Preop examination 01/11/2021   Back pain 06/24/2020   Dysuria 06/24/2020   Tendonitis, Achilles, right 05/27/2019   Controlled substance agreement signed 11/22/2018   Blood glucose elevated 11/22/2018   Vitamin D deficiency 11/22/2018   Sensorineural hearing loss (SNHL), bilateral 06/17/2018   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   Mixed dyslipidemia 08/08/2017   Abnormal EKG 08/08/2017   Cervical somatic dysfunction 03/06/2016   Hemorrhoid thrombosis 03/06/2016   Generalized anxiety disorder 03/06/2016   Left knee pain 11/05/2014   Allergic rhinitis 06/18/2013   Morbid obesity ([place]) 06/18/2013   BPH (benign prostatic hypertrophy) 07/03/2012   Essential hypertension, benign 07/03/2012   Depression, recurrent ([place]) 07/03/2012   GERD (gastroesophageal reflux disease) 07/03/2012   Testosterone deficiency 07/03/2012    Arthur Foster, Arthur Foster, PT 05/27/2021, 9:40 AM  [facility] [address] [place], [place], [address] Phone: [phone]   Fax:  [phone]  Name: Arthur Foster MRN: [id number] Date of Birth: [birth year]

## 2021-05-31 ENCOUNTER — Ambulatory Visit: Payer: Medicare Other | Admitting: Physical Therapy

## 2021-05-31 ENCOUNTER — Other Ambulatory Visit: Payer: Self-pay

## 2021-05-31 DIAGNOSIS — M25662 Stiffness of left knee, not elsewhere classified: Secondary | ICD-10-CM

## 2021-05-31 DIAGNOSIS — R600 Localized edema: Secondary | ICD-10-CM | POA: Diagnosis not present

## 2021-05-31 DIAGNOSIS — G8929 Other chronic pain: Secondary | ICD-10-CM | POA: Diagnosis not present

## 2021-05-31 DIAGNOSIS — M25562 Pain in left knee: Secondary | ICD-10-CM | POA: Diagnosis not present

## 2021-05-31 NOTE — Therapy (Addendum)
[facility] [place], [place], [address] Phone: [phone]   Fax:  [phone]  Physical Therapy Treatment  Patient Details  Name: Arthur Foster MRN: [id number] Date of Birth: 1951-02-17 Referring Provider (PT): Edmonia Lynch MD   Encounter Date: 05/31/2021   PT End of Session - 05/31/21 0857     Visit Number 10    Number of Visits 12    Date for PT Re-Evaluation 06/13/21    Authorization Type FOTO AT LEAST EVERY 5TH VISIT.  PROGRESS NOTE AT 10TH VISIT.  KX MODIFIER AFTER 15 VISITS.    PT Start Time 0815    PT Stop Time 0905    PT Time Calculation (min) 50 min    Activity Tolerance Patient tolerated treatment well    Behavior During Therapy WFL for tasks assessed/performed             Past Medical History:  Diagnosis Date   Anxiety    BPH (benign prostatic hyperplasia)    Depression    Hyperlipidemia    Hypertension    Testosterone deficiency     Past Surgical History:  Procedure Laterality Date   FOOT SURGERY     HERNIA REPAIR Left inguinal    There were no vitals filed for this visit.   Subjective Assessment - 05/31/21 0856     Subjective Pain about a 3 today.  Feels stiff, been putting Voltaren on it.    Pertinent History Prior right foot surgery, HTN, hernia repair.    How long can you walk comfortably? Around home with walker.    Patient Stated Goals Get back to normal life.    Currently in Pain? Yes    Pain Score 3     Pain Location Knee    Pain Orientation Left    Pain Descriptors / Indicators --   "Stiff."   Pain Type Surgical pain    Pain Onset More than a month ago                [facility] PT Assessment - 05/31/21 0001       AROM   Left Knee Flexion 115      PROM   Left Knee Flexion 120                           [facility] Adult PT Treatment/Exercise - 05/31/21 0001       Exercises   Exercises Knee/Hip      Knee/Hip Exercises: Aerobic   Recumbent Bike Level  3 x 15 minutes.      Knee/Hip Exercises: Machines for Strengthening   Cybex Knee Extension 30# x 3 minutes.    Cybex Knee Flexion 40# x 3 minutes.    Cybex Leg Press 3 plates x 3 minutes.      Vasopneumatic   Number Minutes Vasopneumatic  15 minutes    Vasopnuematic Location  --   Left knee.   Vasopneumatic Pressure Low      Manual Therapy   Passive ROM In supine:  3 minute sustained left knee flexion stretch.           Correction:  10# for knee extension exercise.               PT Long Term Goals - 05/31/21 0857       PT LONG TERM GOAL #1   Title Independent with a HEP.    Time 6    Period  Weeks    Status On-going      PT LONG TERM GOAL #2   Title Perform ADL's with pain not > 3/10.    Time 6    Period Weeks    Status Partially Met      PT LONG TERM GOAL #3   Title Full active left knee extension in order to normalize gait.    Time 6    Period Weeks    Status Achieved      PT LONG TERM GOAL #4   Title Active knee flexion to 120 degrees+ so the patient can perform functional tasks and do so with pain not > 2-3/10.    Baseline 115 degrees active and 120 degrees passive.    Time 6    Period Weeks    Status Partially Met      PT LONG TERM GOAL #5   Title Perform a reciprocating stair gait with one railing with pain not > 2-3/10.    Time 6    Period Weeks    Status Achieved                   Plan - 05/31/21 1638     Clinical Impression Statement See "Therapy Note" section.    Personal Factors and Comorbidities Comorbidity 1;Other    Comorbidities Prior right foot surgery, HTN, hernia repair.    Examination-Activity Limitations Other;Locomotion Level    Examination-Participation Restrictions Other    Stability/Clinical Decision Making Stable/Uncomplicated    Rehab Potential Excellent    PT Frequency 3x / week    Consulted and Agree with Plan of Care Patient             Patient will benefit from skilled therapeutic intervention  in order to improve the following deficits and impairments:  Pain, Abnormal gait, Decreased range of motion, Increased edema, Decreased activity tolerance  Visit Diagnosis: Chronic pain of left knee  Localized edema  Stiffness of left knee, not elsewhere classified     Problem List Patient Active Problem List   Diagnosis Date Noted   Preop examination 01/11/2021   Back pain 06/24/2020   Dysuria 06/24/2020   Tendonitis, Achilles, right 05/27/2019   Controlled substance agreement signed 11/22/2018   Blood glucose elevated 11/22/2018   Vitamin D deficiency 11/22/2018   Sensorineural hearing loss (SNHL), bilateral 06/17/2018   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   Mixed dyslipidemia 08/08/2017   Abnormal EKG 08/08/2017   Cervical somatic dysfunction 03/06/2016   Hemorrhoid thrombosis 03/06/2016   Generalized anxiety disorder 03/06/2016   Left knee pain 11/05/2014   Allergic rhinitis 06/18/2013   Morbid obesity ([place]) 06/18/2013   BPH (benign prostatic hypertrophy) 07/03/2012   Essential hypertension, benign 07/03/2012   Depression, recurrent ([place]) 07/03/2012   GERD (gastroesophageal reflux disease) 07/03/2012   Testosterone deficiency 07/03/2012   Progress Note Reporting Period  05/02/21 to 05/31/21.  See note below for Objective Data and Assessment of Progress/Goals. Excellent progress toward goals and an improved FOTO score to 36% limitation.    Lyssa Hackley, [place], PT 05/31/2021, 9:43 AM  [facility] [address] [place], [place], [address] Phone: [phone]   Fax:  [phone]  Name: Arthur Foster MRN: [id number] Date of Birth: Nov 11, 1950

## 2021-06-03 ENCOUNTER — Other Ambulatory Visit: Payer: Self-pay

## 2021-06-03 ENCOUNTER — Ambulatory Visit: Payer: Medicare Other | Attending: Orthopedic Surgery | Admitting: Physical Therapy

## 2021-06-03 DIAGNOSIS — M25662 Stiffness of left knee, not elsewhere classified: Secondary | ICD-10-CM

## 2021-06-03 DIAGNOSIS — R600 Localized edema: Secondary | ICD-10-CM | POA: Diagnosis not present

## 2021-06-03 DIAGNOSIS — G8929 Other chronic pain: Secondary | ICD-10-CM | POA: Diagnosis not present

## 2021-06-03 DIAGNOSIS — M25562 Pain in left knee: Secondary | ICD-10-CM | POA: Insufficient documentation

## 2021-06-03 NOTE — Therapy (Signed)
[facility] ?[place], [place], [address] ?Phone: [phone]   Fax:  [phone] ? ?Physical Therapy Treatment ? ?Patient Details  ?Name: Arthur Foster ?MRN: [id number] ?Date of Birth: [birth year] ?Referring Provider (PT): Arthur Lynch MD ? ? ?Encounter Date: 06/03/2021 ? ? PT End of Session - 06/03/21 1007   ? ? Visit Number 11   ? Number of Visits 12   ? Date for PT Re-Evaluation 06/13/21   ? PT Start Time 0815   ? PT Stop Time 0906   ? PT Time Calculation (min) 51 min   ? Activity Tolerance Patient tolerated treatment well   ? Behavior During Therapy [facility] for tasks assessed/performed   ? ?  ?  ? ?  ? ? ?Past Medical History:  ?Diagnosis Date  ? Anxiety   ? BPH (benign prostatic hyperplasia)   ? Depression   ? Hyperlipidemia   ? Hypertension   ? Testosterone deficiency   ? ? ?Past Surgical History:  ?Procedure Laterality Date  ? FOOT SURGERY    ? HERNIA REPAIR Left inguinal  ? ? ?There were no vitals filed for this visit. ? ? Subjective Assessment - 06/03/21 1008   ? ? Subjective Doing good.   ? Pertinent History Prior right foot surgery, HTN, hernia repair.   ? Patient Stated Goals Get back to normal life.   ? Currently in Pain? Yes   ? Pain Score 3    ? Pain Location Knee   ? Pain Orientation Left   ? Pain Descriptors / Indicators --   "Stiff."  ? Pain Type Surgical pain   ? Pain Onset More than a month ago   ? ?  ?  ? ?  ? ? ? ? ? [facility] PT Assessment - 06/03/21 0001   ? ?  ? PROM  ? Left Knee Flexion 125   ? ?  ?  ? ?  ? ? ? ? ? ? ? ? ? ? ? ? ? ? ? ? [facility] Adult PT Treatment/Exercise - 06/03/21 0001   ? ?  ? Exercises  ? Exercises Knee/Hip   ?  ? Knee/Hip Exercises: Aerobic  ? Recumbent Bike Level 3 x 15 minutes.   ?  ? Knee/Hip Exercises: Machines for Strengthening  ? Cybex Knee Extension 10# x 3 minutes.   ? Cybex Knee Flexion 40# x 3 minutes.   ? Cybex Leg Press 3 plates x 3 minutes.   ?  ? Modalities  ? Modalities Vasopneumatic   ?  ? Vasopneumatic  ?  Number Minutes Vasopneumatic  15 minutes   ? Vasopnuematic Location  --   Left knee.  ? Vasopneumatic Pressure Low   ?  ? Manual Therapy  ? Manual Therapy Passive ROM   ? Passive ROM In supine:  5 minutes, left knee flexion stretching   ? ?  ?  ? ?  ? ? ? ? ? ? ? ? ? ? ? ? ? ? ? PT Long Term Goals - 05/31/21 0857   ? ?  ? PT LONG TERM GOAL #1  ? Title Independent with a HEP.   ? Time 6   ? Period Weeks   ? Status On-going   ?  ? PT LONG TERM GOAL #2  ? Title Perform ADL's with pain not > 3/10.   ? Time 6   ? Period Weeks   ? Status Partially Met   ?  ? PT LONG  TERM GOAL #3  ? Title Full active left knee extension in order to normalize gait.   ? Time 6   ? Period Weeks   ? Status Achieved   ?  ? PT LONG TERM GOAL #4  ? Title Active knee flexion to 120 degrees+ so the patient can perform functional tasks and do so with pain not > 2-3/10.   ? Baseline 115 degrees active and 120 degrees passive.   ? Time 6   ? Period Weeks   ? Status Partially Met   ?  ? PT LONG TERM GOAL #5  ? Title Perform a reciprocating stair gait with one railing with pain not > 2-3/10.   ? Time 6   ? Period Weeks   ? Status Achieved   ? ?  ?  ? ?  ? ? ? ? ? ? ? ? Plan - 06/03/21 1018   ? ? Clinical Impression Statement The patient continues to progress very well.  He achieved passive left knee flexion to 125 degrees today.   ? Personal Factors and Comorbidities Comorbidity 1;Other   ? Comorbidities Prior right foot surgery, HTN, hernia repair.   ? Examination-Activity Limitations Other;Locomotion Level   ? Examination-Participation Restrictions Other   ? Stability/Clinical Decision Making Stable/Uncomplicated   ? Rehab Potential Excellent   ? PT Treatment/Interventions ADLs/Self Care Home Management;Cryotherapy;Electrical Stimulation;Moist Heat;Iontophoresis 82m/ml Dexamethasone;Gait training;Functional mobility training;Therapeutic exercise;Neuromuscular re-education;Therapeutic activities;Patient/family education;Manual techniques;Vasopneumatic  Device   ? PT Next Visit Plan Nustep with progression to recumbent bike.  Progress per TKA protocol.  Vasopneumatic.  Weighted quad and hamstring machines and leg press.   ? Consulted and Agree with Plan of Care Patient   ? ?  ?  ? ?  ? ? ?Patient will benefit from skilled therapeutic intervention in order to improve the following deficits and impairments:  Pain, Abnormal gait, Decreased range of motion, Increased edema, Decreased activity tolerance ? ?Visit Diagnosis: ?Chronic pain of left knee ? ?Localized edema ? ?Stiffness of left knee, not elsewhere classified ? ? ? ? ?Problem List ?Patient Active Problem List  ? Diagnosis Date Noted  ? Preop examination 01/11/2021  ? Back pain 06/24/2020  ? Dysuria 06/24/2020  ? Tendonitis, Achilles, right 05/27/2019  ? Controlled substance agreement signed 11/22/2018  ? Blood glucose elevated 11/22/2018  ? Vitamin D deficiency 11/22/2018  ? Sensorineural hearing loss (SNHL), bilateral 06/17/2018  ? 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  ? Mixed dyslipidemia 08/08/2017  ? Abnormal EKG 08/08/2017  ? Cervical somatic dysfunction 03/06/2016  ? Hemorrhoid thrombosis 03/06/2016  ? Generalized anxiety disorder 03/06/2016  ? Left knee pain 11/05/2014  ? Allergic rhinitis 06/18/2013  ? Morbid obesity ([place] 06/18/2013  ? BPH (benign prostatic hypertrophy) 07/03/2012  ? Essential hypertension, benign 07/03/2012  ? Depression, recurrent ([place] 07/03/2012  ? GERD (gastroesophageal reflux disease) 07/03/2012  ? Testosterone deficiency 07/03/2012  ? ? ?Arthur Foster, CMali PT ?06/03/2021, 10:19 AM ? ?[facility] ?[address]?[place] [address]?Phone: [phone]  Fax:  [phone]? ?Name: Arthur Foster?MRN: [id number]?Date of Birth: 1Nov 18, 1952? ? ? ?

## 2021-06-06 ENCOUNTER — Encounter: Payer: Self-pay | Admitting: Physical Therapy

## 2021-06-06 ENCOUNTER — Other Ambulatory Visit: Payer: Self-pay

## 2021-06-06 ENCOUNTER — Ambulatory Visit: Payer: Medicare Other | Admitting: Physical Therapy

## 2021-06-06 DIAGNOSIS — R600 Localized edema: Secondary | ICD-10-CM | POA: Diagnosis not present

## 2021-06-06 DIAGNOSIS — M25662 Stiffness of left knee, not elsewhere classified: Secondary | ICD-10-CM | POA: Diagnosis not present

## 2021-06-06 DIAGNOSIS — M25562 Pain in left knee: Secondary | ICD-10-CM | POA: Diagnosis not present

## 2021-06-06 DIAGNOSIS — G8929 Other chronic pain: Secondary | ICD-10-CM | POA: Diagnosis not present

## 2021-06-06 NOTE — Therapy (Signed)
[place] ?[facility] ?[address] [place] ?[place], [place], [address] ?Phone: [phone]   Fax:  [phone] ? ?Physical Therapy Treatment ? ?Patient Details  ?Name: Arthur Foster ?MRN: [id number] ?Date of Birth: 06-20-1950 ?Referring Provider (PT): Edmonia Lynch MD ? ? ?Encounter Date: 06/06/2021 ? ? PT End of Session - 06/06/21 0856   ? ? Visit Number 12   ? Number of Visits 12   ? Date for PT Re-Evaluation 06/13/21   ? Authorization Type FOTO AT LEAST EVERY 5TH VISIT.  PROGRESS NOTE AT 10TH VISIT.  KX MODIFIER AFTER 15 VISITS.   ? PT Start Time 0815   ? PT Stop Time [phone]   ? PT Time Calculation (min) 48 min   ? Activity Tolerance Patient tolerated treatment well   ? ?  ?  ? ?  ? ? ?Past Medical History:  ?Diagnosis Date  ? Anxiety   ? BPH (benign prostatic hyperplasia)   ? Depression   ? Hyperlipidemia   ? Hypertension   ? Testosterone deficiency   ? ? ?Past Surgical History:  ?Procedure Laterality Date  ? FOOT SURGERY    ? HERNIA REPAIR Left inguinal  ? ? ?There were no vitals filed for this visit. ? ? Subjective Assessment - 06/06/21 0855   ? ? Subjective No new complaints.   ? Pertinent History Prior right foot surgery, HTN, hernia repair.   ? How long can you walk comfortably? Around home with walker.   ? Patient Stated Goals Get back to normal life.   ? Currently in Pain? Yes   ? Pain Score 3    ? Pain Location Knee   ? Pain Orientation Left   ? Pain Descriptors / Indicators --   "Stiff."  ? Pain Onset More than a month ago   ? ?  ?  ? ?  ? ? ? ? ? ? ? ? ? ? ? ? ? ? ? ? ? ? ? ? [place] Adult PT Treatment/Exercise - 06/06/21 0001   ? ?  ? Exercises  ? Exercises Knee/Hip   ?  ? Knee/Hip Exercises: Aerobic  ? Recumbent Bike Level 3 progressing to 4 x 15 minutes.   ?  ? Knee/Hip Exercises: Machines for Strengthening  ? Cybex Knee Extension 10# x 3 minutes.   ? Cybex Knee Flexion 30# x 3 minutes.   ? Cybex Leg Press 3 plates x 3 minutes.   ?  ? Modalities  ? Modalities Vasopneumatic   ?  ?  Vasopneumatic  ? Number Minutes Vasopneumatic  15 minutes   ? Vasopnuematic Location  --   Left knee.  ? Vasopneumatic Pressure Low   ? ?  ?  ? ?  ? ? ? ? ? ? ? ? ? ? ? ? ? ? ? PT Long Term Goals - 06/06/21 0856   ? ?  ? PT LONG TERM GOAL #1  ? Title Independent with a HEP.   ? Time 6   ? Period Weeks   ? Status Achieved   ?  ? PT LONG TERM GOAL #2  ? Title Perform ADL's with pain not > 3/10.   ? Time 6   ? Period Weeks   ?  ? PT LONG TERM GOAL #3  ? Title Full active left knee extension in order to normalize gait.   ? Baseline Equal to right knee.   ? Time 6   ? Period Weeks   ? Status Achieved   ?  ?  PT LONG TERM GOAL #4  ? Title Active knee flexion to 120 degrees+ so the patient can perform functional tasks and do so with pain not > 2-3/10.   ? Baseline 120 degrees   ? Time 6   ? Period Weeks   ? Status Achieved   ?  ? PT LONG TERM GOAL #5  ? Title Perform a reciprocating stair gait with one railing with pain not > 2-3/10.   ? Time 6   ? Period Weeks   ? Status Achieved   ? ?  ?  ? ?  ? ? ? ? ? ? ? ? Plan - 06/06/21 0928   ? ? Clinical Impression Statement The patient has made excellent progress and achieved active left knee flexion to 120 degrees.  Bilateral knee extension is equal.  Current goals met.   ? Personal Factors and Comorbidities Comorbidity 1;Other   ? Comorbidities Prior right foot surgery, HTN, hernia repair.   ? Examination-Activity Limitations Other;Locomotion Level   ? Examination-Participation Restrictions Other   ? PT Treatment/Interventions ADLs/Self Care Home Management;Cryotherapy;Electrical Stimulation;Moist Heat;Iontophoresis 68m/ml Dexamethasone;Gait training;Functional mobility training;Therapeutic exercise;Neuromuscular re-education;Therapeutic activities;Patient/family education;Manual techniques;Vasopneumatic Device   ? PT Next Visit Plan Nustep with progression to recumbent bike.  Progress per TKA protocol.  Vasopneumatic.  Weighted quad and hamstring machines and leg press.   ?  Consulted and Agree with Plan of Care Patient   ? ?  ?  ? ?  ? ? ?Patient will benefit from skilled therapeutic intervention in order to improve the following deficits and impairments:  Pain, Abnormal gait, Decreased range of motion, Increased edema, Decreased activity tolerance ? ?Visit Diagnosis: ?Chronic pain of left knee ? ?Localized edema ? ? ? ? ?Problem List ?Patient Active Problem List  ? Diagnosis Date Noted  ? Preop examination 01/11/2021  ? Back pain 06/24/2020  ? Dysuria 06/24/2020  ? Tendonitis, Achilles, right 05/27/2019  ? Controlled substance agreement signed 11/22/2018  ? Blood glucose elevated 11/22/2018  ? Vitamin D deficiency 11/22/2018  ? Sensorineural hearing loss (SNHL), bilateral 06/17/2018  ? 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  ? Mixed dyslipidemia 08/08/2017  ? Abnormal EKG 08/08/2017  ? Cervical somatic dysfunction 03/06/2016  ? Hemorrhoid thrombosis 03/06/2016  ? Generalized anxiety disorder 03/06/2016  ? Left knee pain 11/05/2014  ? Allergic rhinitis 06/18/2013  ? Morbid obesity ([place] 06/18/2013  ? BPH (benign prostatic hypertrophy) 07/03/2012  ? Essential hypertension, benign 07/03/2012  ? Depression, recurrent ([place] 07/03/2012  ? GERD (gastroesophageal reflux disease) 07/03/2012  ? Testosterone deficiency 07/03/2012  ? ? ?Bernardine Langworthy, CMali PT ?06/06/2021, 9:30 AM ? ?[place] ?[facility] ?[address]?[place] [address]?Phone: [phone]  Fax:  [phone]? ?Name: RRembert Browe?MRN: [id number]?Date of Birth: [birth year]? ? ? ?

## 2021-06-08 DIAGNOSIS — M1712 Unilateral primary osteoarthritis, left knee: Secondary | ICD-10-CM | POA: Diagnosis not present

## 2021-06-09 ENCOUNTER — Other Ambulatory Visit: Payer: Self-pay

## 2021-06-09 ENCOUNTER — Ambulatory Visit: Payer: Medicare Other | Admitting: Physical Therapy

## 2021-06-09 ENCOUNTER — Encounter: Payer: Self-pay | Admitting: Physical Therapy

## 2021-06-09 DIAGNOSIS — G8929 Other chronic pain: Secondary | ICD-10-CM | POA: Diagnosis not present

## 2021-06-09 DIAGNOSIS — R600 Localized edema: Secondary | ICD-10-CM | POA: Diagnosis not present

## 2021-06-09 DIAGNOSIS — M25662 Stiffness of left knee, not elsewhere classified: Secondary | ICD-10-CM | POA: Diagnosis not present

## 2021-06-09 DIAGNOSIS — M25562 Pain in left knee: Secondary | ICD-10-CM | POA: Diagnosis not present

## 2021-06-09 NOTE — Therapy (Signed)
[place] ?[facility] ?[place] ?[place], [place], [address] ?Phone: [phone]   Fax:  [phone] ? ?Physical Therapy Treatment ? ?Patient Details  ?Name: Arthur Foster ?MRN: [id number] ?Date of Birth: 05/09/1950 ?Referring Provider (PT): Edmonia Lynch MD ? ? ?Encounter Date: 06/09/2021 ? ? PT End of Session - 06/09/21 1006   ? ? Visit Number 13   ? Number of Visits 13   ? Date for PT Re-Evaluation 06/13/21   ? Authorization Type FOTO AT LEAST EVERY 5TH VISIT.  PROGRESS NOTE AT 10TH VISIT.  KX MODIFIER AFTER 15 VISITS.   ? PT Start Time 0815   ? PT Stop Time 1937   ? PT Time Calculation (min) 50 min   ? Activity Tolerance Patient tolerated treatment well   ? Behavior During Therapy [facility] for tasks assessed/performed   ? ?  ?  ? ?  ? ? ?Past Medical History:  ?Diagnosis Date  ? Anxiety   ? BPH (benign prostatic hyperplasia)   ? Depression   ? Hyperlipidemia   ? Hypertension   ? Testosterone deficiency   ? ? ?Past Surgical History:  ?Procedure Laterality Date  ? FOOT SURGERY    ? HERNIA REPAIR Left inguinal  ? ? ?There were no vitals filed for this visit. ? ? Subjective Assessment - 06/09/21 0948   ? ? Subjective Doctor was highly pleased.  Today is my last day.   ? Pertinent History Prior right foot surgery, HTN, hernia repair.   ? How long can you walk comfortably? Around home with walker.   ? Patient Stated Goals Get back to normal life.   ? Currently in Pain? Yes   ? Pain Score 3    ? Pain Location Knee   ? Pain Orientation Left   ? Pain Descriptors / Indicators --   "Stiif."  ? Pain Onset More than a month ago   ? ?  ?  ? ?  ? ? ? ? ? ? ? ? ? ? ? ? ? ? ? ? ? ? ? ? [place] Adult PT Treatment/Exercise - 06/09/21 0001   ? ?  ? Exercises  ? Exercises Knee/Hip   ?  ? Knee/Hip Exercises: Aerobic  ? Recumbent Bike Level 3 x 15 minutes.   ?  ? Knee/Hip Exercises: Machines for Strengthening  ? Cybex Knee Extension 10# x 3 minutes.   ? Cybex Knee Flexion 40# x 3 minutes.   ? Cybex Leg Press  3 plates x 3 minutes.   ?  ? Modalities  ? Modalities Vasopneumatic   ?  ? Vasopneumatic  ? Number Minutes Vasopneumatic  15 minutes   ? Vasopnuematic Location  --   Left knee.  ? Vasopneumatic Pressure Low   ? ?  ?  ? ?  ? ? ? ? ? ? ? ? ? ? ? ? ? ? ? PT Long Term Goals - 06/09/21 1009   ? ?  ? PT LONG TERM GOAL #1  ? Title Independent with a HEP.   ? Time 6   ? Period Weeks   ? Status Achieved   ?  ? PT LONG TERM GOAL #2  ? Title Perform ADL's with pain not > 3/10.   ? Baseline On most days.   ? Time 6   ? Period Weeks   ? Status Achieved   ?  ? PT LONG TERM GOAL #3  ? Title Full active left knee extension in order to  normalize gait.   ? Time 6   ? Period Weeks   ? Status Achieved   ?  ? PT LONG TERM GOAL #4  ? Title Active knee flexion to 120 degrees+ so the patient can perform functional tasks and do so with pain not > 2-3/10.   ? Time 6   ? Period Weeks   ? Status Achieved   ?  ? PT LONG TERM GOAL #5  ? Time 6   ? Period Weeks   ? Status Achieved   ? ?  ?  ? ?  ? ? ? ? ? ? ? ? ? ?Patient will benefit from skilled therapeutic intervention in order to improve the following deficits and impairments:    ? ?Visit Diagnosis: ?Chronic pain of left knee ? ? ? ? ?Problem List ?Patient Active Problem List  ? Diagnosis Date Noted  ? Preop examination 01/11/2021  ? Back pain 06/24/2020  ? Dysuria 06/24/2020  ? Tendonitis, Achilles, right 05/27/2019  ? Controlled substance agreement signed 11/22/2018  ? Blood glucose elevated 11/22/2018  ? Vitamin D deficiency 11/22/2018  ? Sensorineural hearing loss (SNHL), bilateral 06/17/2018  ? 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  ? Mixed dyslipidemia 08/08/2017  ? Abnormal EKG 08/08/2017  ? Cervical somatic dysfunction 03/06/2016  ? Hemorrhoid thrombosis 03/06/2016  ? Generalized anxiety disorder 03/06/2016  ? Left knee pain 11/05/2014  ? Allergic rhinitis 06/18/2013  ? Morbid obesity ([place]) 06/18/2013  ? BPH (benign prostatic hypertrophy) 07/03/2012  ? Essential hypertension,  benign 07/03/2012  ? Depression, recurrent ([place]) 07/03/2012  ? GERD (gastroesophageal reflux disease) 07/03/2012  ? Testosterone deficiency 07/03/2012  ? ? ?Jacody Beneke, [place], PT ?06/09/2021, 10:21 AM ? ?[place] ?[facility] ?[place] ?[place], [place], [address] ?Phone: [phone]   Fax:  [phone] ? ?Name: Arthur Foster ?MRN: [id number] ?Date of Birth: Oct 08, 1950 ? ? ? ?

## 2021-06-14 ENCOUNTER — Other Ambulatory Visit: Payer: Self-pay | Admitting: Nurse Practitioner

## 2021-06-14 DIAGNOSIS — F411 Generalized anxiety disorder: Secondary | ICD-10-CM

## 2021-06-14 DIAGNOSIS — F339 Major depressive disorder, recurrent, unspecified: Secondary | ICD-10-CM

## 2021-06-14 NOTE — Telephone Encounter (Signed)
Last OV 04/25/2021. Last RF 04/25/2021 #45 1RF. Next OV 10/24/2021.  ? ?

## 2021-06-18 DIAGNOSIS — Z20822 Contact with and (suspected) exposure to covid-19: Secondary | ICD-10-CM | POA: Diagnosis not present

## 2021-06-23 DIAGNOSIS — M1712 Unilateral primary osteoarthritis, left knee: Secondary | ICD-10-CM | POA: Diagnosis not present

## 2021-07-01 DIAGNOSIS — M1712 Unilateral primary osteoarthritis, left knee: Secondary | ICD-10-CM | POA: Diagnosis not present

## 2021-08-15 ENCOUNTER — Ambulatory Visit: Payer: Medicare Other | Attending: Orthopedic Surgery | Admitting: Physical Therapy

## 2021-08-15 ENCOUNTER — Encounter: Payer: Self-pay | Admitting: Physical Therapy

## 2021-08-15 DIAGNOSIS — R600 Localized edema: Secondary | ICD-10-CM | POA: Insufficient documentation

## 2021-08-15 DIAGNOSIS — M25662 Stiffness of left knee, not elsewhere classified: Secondary | ICD-10-CM | POA: Diagnosis not present

## 2021-08-15 DIAGNOSIS — M25562 Pain in left knee: Secondary | ICD-10-CM | POA: Diagnosis not present

## 2021-08-15 DIAGNOSIS — G8929 Other chronic pain: Secondary | ICD-10-CM | POA: Insufficient documentation

## 2021-08-15 NOTE — Therapy (Signed)
[place] ?[facility] ?[place] ?[place], [place], [address] ?Phone: [phone]   Fax:  [phone] ? ?Physical Therapy Evaluation ? ?Patient Details  ?Name: Arthur Foster ?MRN: [id number] ?Date of Birth: 01-Aug-1950 ?Referring Provider (PT): Edmonia Lynch MD ? ? ?Encounter Date: 08/15/2021 ? ? PT End of Session - 08/15/21 1136   ? ? Visit Number 1   ? Number of Visits 12   ? Date for PT Re-Evaluation 09/26/21   ? Authorization Type FOTO AT LEAST EVERY 5TH VISIT.  PROGRESS NOTE AT 10TH VISIT.  KX MODIFIER AFTER 15 VISITS.   ? PT Start Time [id number]   ? PT Stop Time 0900   ? PT Time Calculation (min) 43 min   ? Activity Tolerance Patient tolerated treatment well   ? Behavior During Therapy [facility] for tasks assessed/performed   ? ?  ?  ? ?  ? ? ?Past Medical History:  ?Diagnosis Date  ? Anxiety   ? BPH (benign prostatic hyperplasia)   ? Depression   ? Hyperlipidemia   ? Hypertension   ? Testosterone deficiency   ? ? ?Past Surgical History:  ?Procedure Laterality Date  ? FOOT SURGERY    ? HERNIA REPAIR Left inguinal  ? ? ?There were no vitals filed for this visit. ? ? ? Subjective Assessment - 08/15/21 0843   ? ? Subjective The patient presents to the clinic with c/o left knee pain that can become very high (7-8+/10) after sitting for awhile and then standing, the pain is very sharp in nature.  He states that after finishing PT for a partial knee replacement he went to step off a ladder thinking that he was on the last step but was on the second.  As he came off the step his left foor hit first and he fell.  This hurt a great deal.  He sat down awhile and then got up and his left knee gave way.  He states this was exactly two months ago.  After he walks awile his pain reduces.   ? Pertinent History Prior right foot surgery, HTN, hernia repair, left partial knee replacement.   ? How long can you sit comfortably? Unlimited.   ? How long can you walk comfortably? Not an issue.   ? Patient  Stated Goals Sit to stand with low pain-level.   ? Currently in Pain? --   See above.  ? ?  ?  ? ?  ? ? ? ? ? [facility] PT Assessment - 08/15/21 0001   ? ?  ? Assessment  ? Medical Diagnosis Left hamstring and gastroc strain.   ? Referring Provider (PT) Edmonia Lynch MD   ? Onset Date/Surgical Date --   2 months ago.  ?  ? Precautions  ? Precaution Comments No ultrasound.   ?  ? Restrictions  ? Weight Bearing Restrictions No   ?  ? Balance Screen  ? Has the patient fallen in the past 6 months Yes   ? How many times? 2.   ? Has the patient had a decrease in activity level because of a fear of falling?  Yes   ? Is the patient reluctant to leave their home because of a fear of falling?  No   ?  ? Prior Function  ? Level of Independence Independent   ?  ? Observation/Other Assessments  ? Focus on Therapeutic Outcomes (FOTO)  Complete.   ?  ? Observation/Other Assessments-Edema   ? Edema --  No obvious edema.  ?  ? ROM / Strength  ? AROM / PROM / Strength AROM;Strength   ?  ? AROM  ? Overall AROM Comments Normal active left ankle active dorsiflexion.  Left knee active flexion to 110 degrees.   ?  ? Strength  ? Overall Strength Comments Normal left LE strength.   ?  ? Palpation  ? Palpation comment C/o pain in left distal hamstring, lateral head of Gastroc that exhibits increased tone and popliteal fossa.   ?  ? Special Tests  ? Other special tests Supine bilateral 90/90 test is equal.   ?  ? Ambulation/Gait  ? Gait Comments Very antalgic gait the first several steps upon rising form a seated position.   ? ?  ?  ? ?  ? ? ? ? ? ? ? ? ? ? ? ? ? ?Objective measurements completed on examination: See above findings.  ? ? ? ? ? [place] Adult PT Treatment/Exercise - 08/15/21 0001   ? ?  ? Modalities  ? Modalities Moist Heat;Electrical Stimulation   ?  ? Moist Heat Therapy  ? Number Minutes Moist Heat 20 Minutes   ? Moist Heat Location --   Left posterior knee region.  ?  ? Electrical Stimulation  ? Electrical Stimulation Location Left  distal hamstrings/gastroc heads   ? Electrical Stimulation Action IFC at 80-150 Hz. at 40% scan x 20 minutes.   ? ?  ?  ? ?  ? ? ? ? ? ? ? ? ? ? ? ? ? ? ? PT Long Term Goals - 08/15/21 1108   ? ?  ? PT LONG TERM GOAL #1  ? Title Independent with a HEP.   ? Time 6   ? Period Weeks   ? Status New   ?  ? PT LONG TERM GOAL #2  ? Title Perform ADL's with pain not > 3/10.   ? Time 6   ? Period Weeks   ? Status New   ?  ? PT LONG TERM GOAL #3  ? Title Sit to stand with pain not > 2/10.   ? Time 6   ? Period Weeks   ? Status New   ? ?  ?  ? ?  ? ? ? ? ? ? ? ? ? Plan - 08/15/21 1102   ? ? Clinical Impression Statement The patient presents to the clinic today with c/o left distal hamstring and calf pain that came on about two months ago when he came off a ladder.  He has very high pain-levels after sitting for a period of time and then standing to walk.  Once he walks a bit his pain begins to subside.  He was palpably tender and taut to palption over his left lateral Gastroc head.  He also c/o tenderness in the region of the popliteal fossa and lateral distal hamstrings.  He has some limitation of left knee flexion.  Bilateral supine 90/90 tests were equal.Patient will benefit from skilled physical therapy intervention to address pain and deficits.   ? Personal Factors and Comorbidities Comorbidity 1;Other   ? Comorbidities Prior right foot surgery, HTN, hernia repair, left partial knee replacement.   ? Examination-Activity Limitations Other;Locomotion Level   ? Examination-Participation Restrictions Other   ? Stability/Clinical Decision Making Stable/Uncomplicated   ? Rehab Potential Excellent   ? PT Frequency 2x / week   ? PT Duration 6 weeks   ? PT Treatment/Interventions ADLs/Self Care Home Management;Cryotherapy;Dealer  Stimulation;Moist Heat;Iontophoresis '4mg'$ /ml Dexamethasone;Gait training;Functional mobility training;Therapeutic exercise;Therapeutic activities;Patient/family education;Manual techniques;Vasopneumatic  Device   ? PT Next Visit Plan STW/M, Rockerboard, heel raises, pain-free hamstring curls.  HMP/CP/Vaso and electrical stimulation.   ? Consulted and Agree with Plan of Care Patient   ? ?  ?  ? ?  ? ? ?Patient will benefit from skilled therapeutic intervention in order to improve the following deficits and impairments:  Pain, Abnormal gait, Decreased range of motion, Decreased activity tolerance, Increased muscle spasms ? ?Visit Diagnosis: ?Acute pain of left knee - Plan: PT plan of care cert/re-cert ? ? ? ? ?Problem List ?Patient Active Problem List  ? Diagnosis Date Noted  ? Preop examination 01/11/2021  ? Back pain 06/24/2020  ? Dysuria 06/24/2020  ? Tendonitis, Achilles, right 05/27/2019  ? Controlled substance agreement signed 11/22/2018  ? Blood glucose elevated 11/22/2018  ? Vitamin D deficiency 11/22/2018  ? Sensorineural hearing loss (SNHL), bilateral 06/17/2018  ? 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  ? Mixed dyslipidemia 08/08/2017  ? Abnormal EKG 08/08/2017  ? Cervical somatic dysfunction 03/06/2016  ? Hemorrhoid thrombosis 03/06/2016  ? Generalized anxiety disorder 03/06/2016  ? Left knee pain 11/05/2014  ? Allergic rhinitis 06/18/2013  ? Morbid obesity ([place]) 06/18/2013  ? BPH (benign prostatic hypertrophy) 07/03/2012  ? Essential hypertension, benign 07/03/2012  ? Depression, recurrent ([place]) 07/03/2012  ? GERD (gastroesophageal reflux disease) 07/03/2012  ? Testosterone deficiency 07/03/2012  ? ? ?Sherrill Buikema, [place], PT ?08/15/2021, 11:38 AM ? ?[place] ?[facility] ?[place] ?[place], [place], [address] ?Phone: [phone]   Fax:  [phone] ? ?Name: Arthur Foster ?MRN: [id number] ?Date of Birth: July 30, 1950 ? ? ?

## 2021-08-17 ENCOUNTER — Encounter: Payer: Self-pay | Admitting: Physical Therapy

## 2021-08-17 ENCOUNTER — Ambulatory Visit: Payer: Medicare Other | Admitting: Physical Therapy

## 2021-08-17 DIAGNOSIS — R600 Localized edema: Secondary | ICD-10-CM | POA: Diagnosis not present

## 2021-08-17 DIAGNOSIS — M25662 Stiffness of left knee, not elsewhere classified: Secondary | ICD-10-CM | POA: Diagnosis not present

## 2021-08-17 DIAGNOSIS — M25562 Pain in left knee: Secondary | ICD-10-CM

## 2021-08-17 DIAGNOSIS — G8929 Other chronic pain: Secondary | ICD-10-CM

## 2021-08-17 NOTE — Therapy (Signed)
[place] ?[facility] ?[address] ?[place], [place], [address] ?Phone: [phone]   Fax:  [phone] ? ?Physical Therapy Treatment ? ?Patient Details  ?Name: Arthur Foster ?MRN: [id number] ?Date of Birth: 1950/12/20 ?Referring Provider (PT): Edmonia Lynch MD ? ? ?Encounter Date: 08/17/2021 ? ? PT End of Session - 08/17/21 0818   ? ? Visit Number 2   ? Number of Visits 12   ? Date for PT Re-Evaluation 09/26/21   ? Authorization Type FOTO AT LEAST EVERY 5TH VISIT.  PROGRESS NOTE AT 10TH VISIT.  KX MODIFIER AFTER 15 VISITS.   ? PT Start Time 0818   ? PT Stop Time 7628   ? PT Time Calculation (min) 46 min   ? Activity Tolerance Patient tolerated treatment well   ? Behavior During Therapy [facility] for tasks assessed/performed   ? ?  ?  ? ?  ? ? ?Past Medical History:  ?Diagnosis Date  ? Anxiety   ? BPH (benign prostatic hyperplasia)   ? Depression   ? Hyperlipidemia   ? Hypertension   ? Testosterone deficiency   ? ? ?Past Surgical History:  ?Procedure Laterality Date  ? FOOT SURGERY    ? HERNIA REPAIR Left inguinal  ? ? ?There were no vitals filed for this visit. ? ? Subjective Assessment - 08/17/21 0816   ? ? Subjective Reports a temporary relief the other day but by the end of the day he sat down and had more pain.   ? Pertinent History Prior right foot surgery, HTN, hernia repair, left partial knee replacement.   ? How long can you sit comfortably? Unlimited.   ? How long can you walk comfortably? Not an issue.   ? Patient Stated Goals Sit to stand with low pain-level.   ? Currently in Pain? No/denies   ? ?  ?  ? ?  ? ? ? ? ? [facility] PT Assessment - 08/17/21 0001   ? ?  ? Assessment  ? Medical Diagnosis Left hamstring and gastroc strain.   ? Referring Provider (PT) Edmonia Lynch MD   ? Next MD Visit 04/2022   ?  ? Precautions  ? Precaution Comments No ultrasound.   ?  ? Restrictions  ? Weight Bearing Restrictions No   ? ?  ?  ? ?  ? ? ? ? ? ? ? ? ? ? ? ? ? ? ? ? [place] Adult PT  Treatment/Exercise - 08/17/21 0001   ? ?  ? Knee/Hip Exercises: Standing  ? Rocker Board 5 minutes   ?  ? Modalities  ? Modalities Electrical Stimulation;Moist Heat;Ultrasound   ?  ? Moist Heat Therapy  ? Number Minutes Moist Heat 15 Minutes   ? Moist Heat Location Knee   ?  ? Electrical Stimulation  ? Electrical Stimulation Location L lateral/distal HS and gastroc   ? Electrical Stimulation Action IFC   ? Electrical Stimulation Parameters 80-150 hz x15 min   ? Electrical Stimulation Goals Pain;Tone   ?  ? Ultrasound  ? Ultrasound Location L lateral/distal HS and gastroc   ? Ultrasound Parameters Combo 1.5 w/cm2, 100%, 1 mhz x10 min   ? Ultrasound Goals Pain   ?  ? Manual Therapy  ? Manual Therapy Myofascial release   ? Myofascial Release IASTW to L distal HS and superior gastroc to reduce tone and pain   ? ?  ?  ? ?  ? ? ? ? ? ? ? ? ? ? ? ? ? ? ?  PT Long Term Goals - 08/15/21 1108   ? ?  ? PT LONG TERM GOAL #1  ? Title Independent with a HEP.   ? Time 6   ? Period Weeks   ? Status New   ?  ? PT LONG TERM GOAL #2  ? Title Perform ADL's with pain not > 3/10.   ? Time 6   ? Period Weeks   ? Status New   ?  ? PT LONG TERM GOAL #3  ? Title Sit to stand with pain not > 2/10.   ? Time 6   ? Period Weeks   ? Status New   ? ?  ?  ? ?  ? ? ? ? ? ? ? ? Plan - 08/17/21 0901   ? ? Clinical Impression Statement Patient presented in clinic with reports of more pain after standing from a sitting position for prolonged period of time. Patient tender to superior gastroc and tightness in distal lateral HS. Patient cautioned with IASTW that soreness may present. Normal modalities response noted following removal of the modalities.   ? Personal Factors and Comorbidities Comorbidity 1;Other   ? Comorbidities Prior right foot surgery, HTN, hernia repair, left partial knee replacement.   ? Examination-Activity Limitations Other;Locomotion Level   ? Examination-Participation Restrictions Other   ? Stability/Clinical Decision Making  Stable/Uncomplicated   ? Rehab Potential Excellent   ? PT Frequency 2x / week   ? PT Duration 6 weeks   ? PT Treatment/Interventions ADLs/Self Care Home Management;Cryotherapy;Electrical Stimulation;Moist Heat;Iontophoresis '4mg'$ /ml Dexamethasone;Gait training;Functional mobility training;Therapeutic exercise;Therapeutic activities;Patient/family education;Manual techniques;Vasopneumatic Device   ? PT Next Visit Plan STW/M, Rockerboard, heel raises, pain-free hamstring curls.  HMP/CP/Vaso and electrical stimulation.   ? Consulted and Agree with Plan of Care Patient   ? ?  ?  ? ?  ? ? ?Patient will benefit from skilled therapeutic intervention in order to improve the following deficits and impairments:  Pain, Abnormal gait, Decreased range of motion, Decreased activity tolerance, Increased muscle spasms ? ?Visit Diagnosis: ?Acute pain of left knee ? ?Chronic pain of left knee ? ?Localized edema ? ? ? ? ?Problem List ?Patient Active Problem List  ? Diagnosis Date Noted  ? Preop examination 01/11/2021  ? Back pain 06/24/2020  ? Dysuria 06/24/2020  ? Tendonitis, Achilles, right 05/27/2019  ? Controlled substance agreement signed 11/22/2018  ? Blood glucose elevated 11/22/2018  ? Vitamin D deficiency 11/22/2018  ? Sensorineural hearing loss (SNHL), bilateral 06/17/2018  ? 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  ? Mixed dyslipidemia 08/08/2017  ? Abnormal EKG 08/08/2017  ? Cervical somatic dysfunction 03/06/2016  ? Hemorrhoid thrombosis 03/06/2016  ? Generalized anxiety disorder 03/06/2016  ? Left knee pain 11/05/2014  ? Allergic rhinitis 06/18/2013  ? Morbid obesity ([place]) 06/18/2013  ? BPH (benign prostatic hypertrophy) 07/03/2012  ? Essential hypertension, benign 07/03/2012  ? Depression, recurrent ([place]) 07/03/2012  ? GERD (gastroesophageal reflux disease) 07/03/2012  ? Testosterone deficiency 07/03/2012  ? ? ?Standley Brooking, PTA ?08/17/2021, 9:17 AM ? ?[place] ?[facility] ?[address] ?[place], [place], [address] ?Phone: [phone]   Fax:  [phone] ? ?Name: Arthur Foster Toren ?MRN: [id number] ?Date of Birth: 24-Jun-1950 ? ? ? ?

## 2021-08-22 ENCOUNTER — Ambulatory Visit: Payer: Medicare Other | Admitting: Physical Therapy

## 2021-08-22 ENCOUNTER — Encounter: Payer: Self-pay | Admitting: Physical Therapy

## 2021-08-22 DIAGNOSIS — G8929 Other chronic pain: Secondary | ICD-10-CM | POA: Diagnosis not present

## 2021-08-22 DIAGNOSIS — M25662 Stiffness of left knee, not elsewhere classified: Secondary | ICD-10-CM | POA: Diagnosis not present

## 2021-08-22 DIAGNOSIS — R600 Localized edema: Secondary | ICD-10-CM | POA: Diagnosis not present

## 2021-08-22 DIAGNOSIS — M25562 Pain in left knee: Secondary | ICD-10-CM

## 2021-08-22 NOTE — Therapy (Signed)
[facility] [place], [place], [address] Phone: [phone]   Fax:  [phone]  Physical Therapy Treatment  Patient Details  Name: Arthur Foster MRN: [id number] Date of Birth: Sep 13, 1950 Referring Provider (PT): Edmonia Lynch MD   Encounter Date: 08/22/2021   PT End of Session - 08/22/21 1884     Visit Number 3    Number of Visits 12    Date for PT Re-Evaluation 09/26/21    Authorization Type FOTO AT LEAST EVERY 5TH VISIT.  PROGRESS NOTE AT 10TH VISIT.  KX MODIFIER AFTER 15 VISITS.    PT Start Time 0822    PT Stop Time 0901    PT Time Calculation (min) 39 min    Activity Tolerance Patient tolerated treatment well    Behavior During Therapy WFL for tasks assessed/performed             Past Medical History:  Diagnosis Date   Anxiety    BPH (benign prostatic hyperplasia)    Depression    Hyperlipidemia    Hypertension    Testosterone deficiency     Past Surgical History:  Procedure Laterality Date   FOOT SURGERY     HERNIA REPAIR Left inguinal    There were no vitals filed for this visit.   Subjective Assessment - 08/22/21 0815     Subjective Still hurting more when he stands after prolonged sitting. reports that after last PT session he went home and planted a garden and pressure washed at home and had more pain during activity.    Pertinent History Prior right foot surgery, HTN, hernia repair, left partial knee replacement.    How long can you sit comfortably? Unlimited.    How long can you walk comfortably? Not an issue.    Patient Stated Goals Sit to stand with low pain-level.    Currently in Pain? No/denies                [facility] PT Assessment - 08/22/21 0001       Assessment   Medical Diagnosis Left hamstring and gastroc strain.    Referring Provider (PT) Edmonia Lynch MD    Next MD Visit 04/2022      Precautions   Precaution Comments No ultrasound.      Restrictions   Weight Bearing  Restrictions No                           [facility] Adult PT Treatment/Exercise - 08/22/21 0001       Exercises   Exercises Knee/Hip      Knee/Hip Exercises: Standing   Heel Raises Both;20 reps    Heel Raises Limitations B toe raise x20 reps    Knee Flexion AROM;Left;20 reps    Rocker Board 5 minutes      Modalities   Modalities Electrical Stimulation;Moist Heat;Ultrasound      Moist Heat Therapy   Number Minutes Moist Heat 10 Minutes    Moist Heat Location Knee      Electrical Stimulation   Electrical Stimulation Location L lateral/distal HS and gastroc    Electrical Stimulation Action IFC    Electrical Stimulation Parameters 80-150 hz x10 min    Electrical Stimulation Goals Pain;Tone      Manual Therapy   Manual Therapy Myofascial release    Myofascial Release IASTW to L distal HS and superior gastroc to reduce tone and pain  PT Long Term Goals - 08/15/21 1108       PT LONG TERM GOAL #1   Title Independent with a HEP.    Time 6    Period Weeks    Status New      PT LONG TERM GOAL #2   Title Perform ADL's with pain not > 3/10.    Time 6    Period Weeks    Status New      PT LONG TERM GOAL #3   Title Sit to stand with pain not > 2/10.    Time 6    Period Weeks    Status New                   Plan - 08/22/21 1497     Clinical Impression Statement Patient presented in clinic with reports of continued gastroc and HS pain. Patient progressed with stretching and active HS activities with no complaints of pain. IASTW completed to L distal HS and superior gastroc. Good redness response with IASTW. Normal modalities response noted following removal of the modalities.    Personal Factors and Comorbidities Comorbidity 1;Other    Comorbidities Prior right foot surgery, HTN, hernia repair, left partial knee replacement.    Examination-Activity Limitations Other;Locomotion Level     Examination-Participation Restrictions Other    Stability/Clinical Decision Making Stable/Uncomplicated    Rehab Potential Excellent    PT Frequency 2x / week    PT Duration 6 weeks    PT Treatment/Interventions ADLs/Self Care Home Management;Cryotherapy;Electrical Stimulation;Moist Heat;Iontophoresis '4mg'$ /ml Dexamethasone;Gait training;Functional mobility training;Therapeutic exercise;Therapeutic activities;Patient/family education;Manual techniques;Vasopneumatic Device    PT Next Visit Plan STW/M, Rockerboard, heel raises, pain-free hamstring curls.  HMP/CP/Vaso and electrical stimulation.    Consulted and Agree with Plan of Care Patient             Patient will benefit from skilled therapeutic intervention in order to improve the following deficits and impairments:  Pain, Abnormal gait, Decreased range of motion, Decreased activity tolerance, Increased muscle spasms  Visit Diagnosis: Acute pain of left knee  Chronic pain of left knee  Localized edema     Problem List Patient Active Problem List   Diagnosis Date Noted   Preop examination 01/11/2021   Back pain 06/24/2020   Dysuria 06/24/2020   Tendonitis, Achilles, right 05/27/2019   Controlled substance agreement signed 11/22/2018   Blood glucose elevated 11/22/2018   Vitamin D deficiency 11/22/2018   Sensorineural hearing loss (SNHL), bilateral 06/17/2018   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   Mixed dyslipidemia 08/08/2017   Abnormal EKG 08/08/2017   Cervical somatic dysfunction 03/06/2016   Hemorrhoid thrombosis 03/06/2016   Generalized anxiety disorder 03/06/2016   Left knee pain 11/05/2014   Allergic rhinitis 06/18/2013   Morbid obesity ([place]) 06/18/2013   BPH (benign prostatic hypertrophy) 07/03/2012   Essential hypertension, benign 07/03/2012   Depression, recurrent ([place]) 07/03/2012   GERD (gastroesophageal reflux disease) 07/03/2012   Testosterone deficiency 07/03/2012    Standley Brooking,  PTA 08/22/2021, 9:56 AM  [facility] [address] [place], [place], [address] Phone: [phone]   Fax:  [phone]  Name: Arthur Foster MRN: [id number] Date of Birth: 1950/10/26

## 2021-08-25 ENCOUNTER — Encounter: Payer: Self-pay | Admitting: Physical Therapy

## 2021-08-25 ENCOUNTER — Ambulatory Visit: Payer: Medicare Other | Admitting: Physical Therapy

## 2021-08-25 DIAGNOSIS — M25562 Pain in left knee: Secondary | ICD-10-CM | POA: Diagnosis not present

## 2021-08-25 DIAGNOSIS — G8929 Other chronic pain: Secondary | ICD-10-CM | POA: Diagnosis not present

## 2021-08-25 DIAGNOSIS — M25662 Stiffness of left knee, not elsewhere classified: Secondary | ICD-10-CM | POA: Diagnosis not present

## 2021-08-25 DIAGNOSIS — R600 Localized edema: Secondary | ICD-10-CM | POA: Diagnosis not present

## 2021-08-25 NOTE — Therapy (Signed)
[facility] [place], [place], [address] Phone: [phone]   Fax:  [phone]  Physical Therapy Treatment  Patient Details  Name: Arthur Foster MRN: [id number] Date of Birth: Oct 25, 1950 Referring Provider (PT): Edmonia Lynch MD   Encounter Date: 08/25/2021   PT End of Session - 08/25/21 0845     Visit Number 4    Number of Visits 12    Date for PT Re-Evaluation 09/26/21    Authorization Type FOTO AT LEAST EVERY 5TH VISIT.  PROGRESS NOTE AT 10TH VISIT.  KX MODIFIER AFTER 15 VISITS.    PT Start Time 0820    PT Stop Time 0900    PT Time Calculation (min) 40 min    Activity Tolerance Patient tolerated treatment well    Behavior During Therapy WFL for tasks assessed/performed             Past Medical History:  Diagnosis Date   Anxiety    BPH (benign prostatic hyperplasia)    Depression    Hyperlipidemia    Hypertension    Testosterone deficiency     Past Surgical History:  Procedure Laterality Date   FOOT SURGERY     HERNIA REPAIR Left inguinal    There were no vitals filed for this visit.   Subjective Assessment - 08/25/21 0821     Subjective reports that following PT sessions he does have less pain. Now he is experiencing less pain with sit > stands.    Pertinent History Prior right foot surgery, HTN, hernia repair, left partial knee replacement.    How long can you sit comfortably? Unlimited.    How long can you walk comfortably? Not an issue.    Patient Stated Goals Sit to stand with low pain-level.    Currently in Pain? No/denies                [facility] PT Assessment - 08/25/21 0001       Assessment   Medical Diagnosis Left hamstring and gastroc strain.    Referring Provider (PT) Edmonia Lynch MD    Next MD Visit 04/2022      Precautions   Precaution Comments No ultrasound.                           [facility] Adult PT Treatment/Exercise - 08/25/21 0001       Knee/Hip  Exercises: Standing   Heel Raises Both;3 sets;20 reps    Knee Flexion AROM;Left;20 reps    Rocker Board 5 minutes      Modalities   Modalities Electrical Stimulation;Moist Heat;Ultrasound      Moist Heat Therapy   Number Minutes Moist Heat 15 Minutes    Moist Heat Location Knee      Electrical Stimulation   Electrical Stimulation Location L lateral/distal HS and gastroc    Electrical Stimulation Action Pre-Mod    Electrical Stimulation Parameters 80-150 hz x15 min    Electrical Stimulation Goals Pain;Tone      Manual Therapy   Manual Therapy Myofascial release    Myofascial Release IASTW to L distal HS and superior gastroc to reduce tone and pain                          PT Long Term Goals - 08/15/21 1108       PT LONG TERM GOAL #1   Title Independent with a HEP.  Time 6    Period Weeks    Status New      PT LONG TERM GOAL #2   Title Perform ADL's with pain not > 3/10.    Time 6    Period Weeks    Status New      PT LONG TERM GOAL #3   Title Sit to stand with pain not > 2/10.    Time 6    Period Weeks    Status New                   Plan - 08/25/21 7262     Clinical Impression Statement Patient presented in clinic with reports of less severe pain after prolonged sitting but still present. Patient able to tolerate some active ROM and strengthening without complaint of pain. Patient especially tender over the lateral HS tendon and sore in popliteal fossa. Good redness response noted with IASTW to L lateral HS and lateral knee. Normal modalities response noted following removal of the modalities.    Personal Factors and Comorbidities Comorbidity 1;Other    Comorbidities Prior right foot surgery, HTN, hernia repair, left partial knee replacement.    Examination-Activity Limitations Other;Locomotion Level    Examination-Participation Restrictions Other    Stability/Clinical Decision Making Stable/Uncomplicated    Rehab Potential Excellent     PT Frequency 2x / week    PT Duration 6 weeks    PT Treatment/Interventions ADLs/Self Care Home Management;Cryotherapy;Electrical Stimulation;Moist Heat;Iontophoresis '4mg'$ /ml Dexamethasone;Gait training;Functional mobility training;Therapeutic exercise;Therapeutic activities;Patient/family education;Manual techniques;Vasopneumatic Device    PT Next Visit Plan STW/M, Rockerboard, heel raises, pain-free hamstring curls.  HMP/CP/Vaso and electrical stimulation.    Consulted and Agree with Plan of Care Patient             Patient will benefit from skilled therapeutic intervention in order to improve the following deficits and impairments:  Pain, Abnormal gait, Decreased range of motion, Decreased activity tolerance, Increased muscle spasms  Visit Diagnosis: Acute pain of left knee  Chronic pain of left knee  Localized edema     Problem List Patient Active Problem List   Diagnosis Date Noted   Preop examination 01/11/2021   Back pain 06/24/2020   Dysuria 06/24/2020   Tendonitis, Achilles, right 05/27/2019   Controlled substance agreement signed 11/22/2018   Blood glucose elevated 11/22/2018   Vitamin D deficiency 11/22/2018   Sensorineural hearing loss (SNHL), bilateral 06/17/2018   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   Mixed dyslipidemia 08/08/2017   Abnormal EKG 08/08/2017   Cervical somatic dysfunction 03/06/2016   Hemorrhoid thrombosis 03/06/2016   Generalized anxiety disorder 03/06/2016   Left knee pain 11/05/2014   Allergic rhinitis 06/18/2013   Morbid obesity ([place]) 06/18/2013   BPH (benign prostatic hypertrophy) 07/03/2012   Essential hypertension, benign 07/03/2012   Depression, recurrent ([place]) 07/03/2012   GERD (gastroesophageal reflux disease) 07/03/2012   Testosterone deficiency 07/03/2012    Standley Brooking, PTA 08/25/2021, 9:57 AM  [facility] [address] [place], [place], [address] Phone: [phone]    Fax:  [phone]  Name: Bengie Kaucher MRN: [id number] Date of Birth: [birth year]

## 2021-08-30 ENCOUNTER — Encounter: Payer: Self-pay | Admitting: Physical Therapy

## 2021-08-30 ENCOUNTER — Ambulatory Visit: Payer: Medicare Other | Admitting: Physical Therapy

## 2021-08-30 DIAGNOSIS — M25562 Pain in left knee: Secondary | ICD-10-CM

## 2021-08-30 DIAGNOSIS — G8929 Other chronic pain: Secondary | ICD-10-CM | POA: Diagnosis not present

## 2021-08-30 DIAGNOSIS — R600 Localized edema: Secondary | ICD-10-CM

## 2021-08-30 DIAGNOSIS — M25662 Stiffness of left knee, not elsewhere classified: Secondary | ICD-10-CM | POA: Diagnosis not present

## 2021-08-30 NOTE — Therapy (Signed)
[facility] [place], [place], [address] Phone: [phone]   Fax:  [phone]  Physical Therapy Treatment  Patient Details  Name: Arthur Foster MRN: [id number] Date of Birth: 1950/07/05 Referring Provider (PT): Edmonia Lynch MD   Encounter Date: 08/30/2021   PT End of Session - 08/30/21 0828     Visit Number 5    Number of Visits 12    Date for PT Re-Evaluation 09/26/21    Authorization Type FOTO AT LEAST EVERY 5TH VISIT.  PROGRESS NOTE AT 10TH VISIT.  KX MODIFIER AFTER 15 VISITS.    PT Start Time 0818    PT Stop Time 0903    PT Time Calculation (min) 45 min    Activity Tolerance Patient tolerated treatment well    Behavior During Therapy WFL for tasks assessed/performed             Past Medical History:  Diagnosis Date   Anxiety    BPH (benign prostatic hyperplasia)    Depression    Hyperlipidemia    Hypertension    Testosterone deficiency     Past Surgical History:  Procedure Laterality Date   FOOT SURGERY     HERNIA REPAIR Left inguinal    There were no vitals filed for this visit.   Subjective Assessment - 08/30/21 0821     Subjective Reports that he had the most relief from the last treatment.    Pertinent History Prior right foot surgery, HTN, hernia repair, left partial knee replacement.    How long can you sit comfortably? Unlimited.    How long can you walk comfortably? Not an issue.    Patient Stated Goals Sit to stand with low pain-level.    Currently in Pain? No/denies                [facility] PT Assessment - 08/30/21 0001       Assessment   Medical Diagnosis Left hamstring and gastroc strain.    Referring Provider (PT) Edmonia Lynch MD    Next MD Visit 04/2022      Precautions   Precaution Comments No ultrasound.                           [facility] Adult PT Treatment/Exercise - 08/30/21 0001       Knee/Hip Exercises: Standing   Knee Flexion  Strengthening;Left;20 reps;10 reps;Limitations    Knee Flexion Limitations 2#    Hip Extension Stengthening;Left;20 reps;10 reps;Knee straight;Limitations    Extension Limitations 2#    Rocker Board 5 minutes      Modalities   Modalities Electrical Stimulation;Ultrasound      Acupuncturist Location L lateral/distal HS and gastroc    Electrical Stimulation Action IFC    Electrical Stimulation Parameters 80-150 hz x10 min    Electrical Stimulation Goals Pain;Tone      Ultrasound   Ultrasound Location L lateral HS    Ultrasound Parameters Combo 1.5 w/cm2, 100%, 1 mhz x10 min    Ultrasound Goals Pain      Manual Therapy   Manual Therapy Myofascial release    Myofascial Release IASTW to L distal HS and superior gastroc to reduce tone and pain                          PT Long Term Goals - 08/15/21 1108       PT  LONG TERM GOAL #1   Title Independent with a HEP.    Time 6    Period Weeks    Status New      PT LONG TERM GOAL #2   Title Perform ADL's with pain not > 3/10.    Time 6    Period Weeks    Status New      PT LONG TERM GOAL #3   Title Sit to stand with pain not > 2/10.    Time 6    Period Weeks    Status New                   Plan - 08/30/21 0905     Clinical Impression Statement Patient presented in clinic with reports of less symptoms since last visit until yesterday when he was cutting a tree and pulling it into the woods. Patient still having minimal pain when standing after sitting. Light resistance added to HS strengthening without complaint of pain. Good redness response to IASTW of lateral HS region. Normal modalities response noted folllowing removal of the modalities.    Personal Factors and Comorbidities Comorbidity 1;Other    Comorbidities Prior right foot surgery, HTN, hernia repair, left partial knee replacement.    Examination-Activity Limitations Other;Locomotion Level     Examination-Participation Restrictions Other    Stability/Clinical Decision Making Stable/Uncomplicated    Rehab Potential Excellent    PT Frequency 2x / week    PT Duration 6 weeks    PT Treatment/Interventions ADLs/Self Care Home Management;Cryotherapy;Electrical Stimulation;Moist Heat;Iontophoresis '4mg'$ /ml Dexamethasone;Gait training;Functional mobility training;Therapeutic exercise;Therapeutic activities;Patient/family education;Manual techniques;Vasopneumatic Device    PT Next Visit Plan STW/M, Rockerboard, heel raises, pain-free hamstring curls.  HMP/CP/Vaso and electrical stimulation.    Consulted and Agree with Plan of Care Patient             Patient will benefit from skilled therapeutic intervention in order to improve the following deficits and impairments:  Pain, Abnormal gait, Decreased range of motion, Decreased activity tolerance, Increased muscle spasms  Visit Diagnosis: Acute pain of left knee  Localized edema  Stiffness of left knee, not elsewhere classified     Problem List Patient Active Problem List   Diagnosis Date Noted   Preop examination 01/11/2021   Back pain 06/24/2020   Dysuria 06/24/2020   Tendonitis, Achilles, right 05/27/2019   Controlled substance agreement signed 11/22/2018   Blood glucose elevated 11/22/2018   Vitamin D deficiency 11/22/2018   Sensorineural hearing loss (SNHL), bilateral 06/17/2018   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 [place], [place], [address] Phone: [phone]   Fax:  [phone]  Name: Arthur Foster MRN: [id number] Date of Birth: [birth year]

## 2021-09-02 ENCOUNTER — Encounter: Payer: Self-pay | Admitting: Physical Therapy

## 2021-09-02 ENCOUNTER — Ambulatory Visit: Payer: Medicare Other | Attending: Orthopedic Surgery | Admitting: Physical Therapy

## 2021-09-02 DIAGNOSIS — M25662 Stiffness of left knee, not elsewhere classified: Secondary | ICD-10-CM | POA: Diagnosis not present

## 2021-09-02 DIAGNOSIS — M25562 Pain in left knee: Secondary | ICD-10-CM | POA: Insufficient documentation

## 2021-09-02 DIAGNOSIS — R600 Localized edema: Secondary | ICD-10-CM | POA: Diagnosis not present

## 2021-09-02 NOTE — Therapy (Signed)
[facility] [place], [place], [address] Phone: [phone]   Fax:  [phone]  Physical Therapy Treatment  Patient Details  Name: Arthur Foster MRN: [id number] Date of Birth: February 05, 1951 Referring Provider (PT): Edmonia Lynch MD   Encounter Date: 09/02/2021   PT End of Session - 09/02/21 0954     Visit Number 6    Number of Visits 12    Date for PT Re-Evaluation 09/26/21    Authorization Type FOTO AT LEAST EVERY 5TH VISIT.  PROGRESS NOTE AT 10TH VISIT.  KX MODIFIER AFTER 15 VISITS.    PT Start Time 0945    PT Stop Time 1036    PT Time Calculation (min) 51 min    Activity Tolerance Patient tolerated treatment well    Behavior During Therapy WFL for tasks assessed/performed             Past Medical History:  Diagnosis Date   Anxiety    BPH (benign prostatic hyperplasia)    Depression    Hyperlipidemia    Hypertension    Testosterone deficiency     Past Surgical History:  Procedure Laterality Date   FOOT SURGERY     HERNIA REPAIR Left inguinal    There were no vitals filed for this visit.   Subjective Assessment - 09/02/21 0953     Subjective Reports that relief from PT session on 08/30/2021 did not last as long as last week. 2-3 days compared to 4-5 days.    Pertinent History Prior right foot surgery, HTN, hernia repair, left partial knee replacement.    How long can you sit comfortably? Unlimited.    How long can you walk comfortably? Not an issue.    Patient Stated Goals Sit to stand with low pain-level.    Currently in Pain? No/denies                [facility] PT Assessment - 09/02/21 0001       Assessment   Medical Diagnosis Left hamstring and gastroc strain.    Referring Provider (PT) Edmonia Lynch MD    Next MD Visit 04/2022      Precautions   Precaution Comments No ultrasound.                           [facility] Adult PT Treatment/Exercise - 09/02/21 0001       Knee/Hip  Exercises: Stretches   Passive Hamstring Stretch Left;3 reps;30 seconds      Knee/Hip Exercises: Aerobic   Recumbent Bike L4 x13 min      Knee/Hip Exercises: Standing   Knee Flexion Strengthening;Left;20 reps;10 reps;Limitations    Knee Flexion Limitations 5#    Rocker Board 5 minutes      Modalities   Modalities Electrical Stimulation;Vasopneumatic      Electrical Stimulation   Electrical Stimulation Location L ITB/ posterior knee    Electrical Stimulation Action IFC    Electrical Stimulation Parameters 80-150 hz x10 min    Electrical Stimulation Goals Pain;Tone      Vasopneumatic   Number Minutes Vasopneumatic  10 minutes    Vasopnuematic Location  Knee    Vasopneumatic Pressure Medium    Vasopneumatic Temperature  34      Manual Therapy   Manual Therapy Myofascial release    Myofascial Release IASTW to L distal HS, ITB and superior gastroc to reduce tone and pain  PT Long Term Goals - 09/02/21 1136       PT LONG TERM GOAL #1   Title Independent with a HEP.    Time 6    Period Weeks    Status On-going      PT LONG TERM GOAL #2   Title Perform ADL's with pain not > 3/10.    Time 6    Period Weeks    Status On-going      PT LONG TERM GOAL #3   Title Sit to stand with pain not > 2/10.    Time 6    Period Weeks    Status On-going                   Plan - 09/02/21 1026     Clinical Impression Statement Patient presented in clinic with reports of continued pain less. Patient's L popliteal fossa obesrved with increased edema. Patient able complete all therex with no pain even with stationary bike added. Good redness response to the L ITB and distal HS. Patient advised to ice and elevate to help assist with edema of L knee. Normal modalities response noted following removal of the modalities.    Personal Factors and Comorbidities Comorbidity 1;Other    Comorbidities Prior right foot surgery, HTN, hernia repair, left  partial knee replacement.    Examination-Activity Limitations Other;Locomotion Level    Examination-Participation Restrictions Other    Stability/Clinical Decision Making Stable/Uncomplicated    Rehab Potential Excellent    PT Frequency 2x / week    PT Duration 6 weeks    PT Treatment/Interventions ADLs/Self Care Home Management;Cryotherapy;Electrical Stimulation;Moist Heat;Iontophoresis '4mg'$ /ml Dexamethasone;Gait training;Functional mobility training;Therapeutic exercise;Therapeutic activities;Patient/family education;Manual techniques;Vasopneumatic Device    PT Next Visit Plan STW/M, Rockerboard, heel raises, pain-free hamstring curls.  HMP/CP/Vaso and electrical stimulation.    Consulted and Agree with Plan of Care Patient             Patient will benefit from skilled therapeutic intervention in order to improve the following deficits and impairments:  Pain, Abnormal gait, Decreased range of motion, Decreased activity tolerance, Increased muscle spasms  Visit Diagnosis: Acute pain of left knee  Localized edema     Problem List Patient Active Problem List   Diagnosis Date Noted   Preop examination 01/11/2021   Back pain 06/24/2020   Dysuria 06/24/2020   Tendonitis, Achilles, right 05/27/2019   Controlled substance agreement signed 11/22/2018   Blood glucose elevated 11/22/2018   Vitamin D deficiency 11/22/2018   Sensorineural hearing loss (SNHL), bilateral 06/17/2018   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   Mixed dyslipidemia 08/08/2017   Abnormal EKG 08/08/2017   Cervical somatic dysfunction 03/06/2016   Hemorrhoid thrombosis 03/06/2016   Generalized anxiety disorder 03/06/2016   Left knee pain 11/05/2014   Allergic rhinitis 06/18/2013   Morbid obesity ([place]) 06/18/2013   BPH (benign prostatic hypertrophy) 07/03/2012   Essential hypertension, benign 07/03/2012   Depression, recurrent ([place]) 07/03/2012   GERD (gastroesophageal reflux disease) 07/03/2012    Testosterone deficiency 07/03/2012   Rationale for Evaluation and Treatment Rehabilitation   Standley Brooking, PTA 09/02/2021, 11:36 AM  [facility] [address] [place], [place], [address] Phone: [phone]   Fax:  [phone]  Name: Arthur Foster MRN: [id number] Date of Birth: 02/28/1951

## 2021-09-05 ENCOUNTER — Ambulatory Visit: Payer: Medicare Other | Admitting: Physical Therapy

## 2021-09-05 DIAGNOSIS — M25562 Pain in left knee: Secondary | ICD-10-CM

## 2021-09-05 DIAGNOSIS — R600 Localized edema: Secondary | ICD-10-CM | POA: Diagnosis not present

## 2021-09-05 DIAGNOSIS — M25662 Stiffness of left knee, not elsewhere classified: Secondary | ICD-10-CM | POA: Diagnosis not present

## 2021-09-05 NOTE — Therapy (Signed)
[facility] [place], [place], [address] Phone: [phone]   Fax:  [phone]  Physical Therapy Treatment  Patient Details  Name: Arthur Foster MRN: [id number] Date of Birth: 1950-11-30 Referring Provider (Arthur Foster): Edmonia Lynch MD   Encounter Date: 09/05/2021   Arthur Foster End of Session - 09/05/21 1341     Visit Number 7    Number of Visits 12    Date for Arthur Foster Re-Evaluation 09/26/21    Authorization Type FOTO AT LEAST EVERY 5TH VISIT.  PROGRESS NOTE AT 10TH VISIT.  KX MODIFIER AFTER 15 VISITS.    Arthur Foster Start Time 0100    Arthur Foster Stop Time 0150    Arthur Foster Time Calculation (min) 50 min    Behavior During Therapy WFL for tasks assessed/performed             Past Medical History:  Diagnosis Date   Anxiety    BPH (benign prostatic hyperplasia)    Depression    Hyperlipidemia    Hypertension    Testosterone deficiency     Past Surgical History:  Procedure Laterality Date   FOOT SURGERY     HERNIA REPAIR Left inguinal    There were no vitals filed for this visit.   Subjective Assessment - 09/05/21 1343     Subjective Temprary relief of pain from treatments thus far.    Pertinent History Prior right foot surgery, HTN, hernia repair, left partial knee replacement.  CC continues to be pain upon rising from sitting.    How long can you sit comfortably? Unlimited.    How long can you walk comfortably? Not an issue.    Patient Stated Goals Sit to stand with low pain-level.    Currently in Pain? No/denies                               [facility] Adult Arthur Foster Treatment/Exercise - 09/05/21 0001       Exercises   Exercises Knee/Hip      Knee/Hip Exercises: Aerobic   Recumbent Bike Level 3 x 12 minutes.      Insurance risk surveyor Pre-mod.    Electrical Stimulation Parameters 80-150 Hz x 20 minutes.    Electrical Stimulation Goals Pain;Tone      Vasopneumatic   Number Minutes  Vasopneumatic  20 minutes    Vasopnuematic Location  --   Left knee.   Vasopneumatic Pressure Low      Manual Therapy   Manual Therapy Soft tissue mobilization    Myofascial Release In prone:  STW/M x 12 minutes to patient's left lateral distal hamstrings, ITB and Gastroc head x 12 minutes.                          Arthur Foster Long Term Goals - 09/02/21 1136       Arthur Foster LONG TERM GOAL #1   Title Independent with a HEP.    Time 6    Period Weeks    Status On-going      Arthur Foster LONG TERM GOAL #2   Title Perform ADL's with pain not > 3/10.    Time 6    Period Weeks    Status On-going      Arthur Foster LONG TERM GOAL #3   Title Sit to stand with pain not > 2/10.    Time 6    Period Weeks  Status On-going                   Plan - 09/05/21 1407     Clinical Impression Statement The patient did very well with treatment today.  His lateral Gastroc head continues to exhibit tone.  Her tolerated soft tissue very well.    Personal Factors and Comorbidities Comorbidity 1;Other    Comorbidities Prior right foot surgery, HTN, hernia repair, left partial knee replacement.    Examination-Activity Limitations Other;Locomotion Level    Examination-Participation Restrictions Other    Stability/Clinical Decision Making Stable/Uncomplicated    Rehab Potential Excellent    Arthur Foster Frequency 2x / week    Arthur Foster Duration 6 weeks    Arthur Foster Treatment/Interventions ADLs/Self Care Home Management;Cryotherapy;Electrical Stimulation;Moist Heat;Iontophoresis '4mg'$ /ml Dexamethasone;Gait training;Functional mobility training;Therapeutic exercise;Therapeutic activities;Patient/family education;Manual techniques;Vasopneumatic Device    Arthur Foster Next Visit Plan STW/M, Rockerboard, heel raises, pain-free hamstring curls.  HMP/CP/Vaso and electrical stimulation.    Consulted and Agree with Plan of Care Patient             Patient will benefit from skilled therapeutic intervention in order to improve the following  deficits and impairments:  Pain, Abnormal gait, Decreased range of motion, Decreased activity tolerance, Increased muscle spasms  Visit Diagnosis: Acute pain of left knee     Problem List Patient Active Problem List   Diagnosis Date Noted   Preop examination 01/11/2021   Back pain 06/24/2020   Dysuria 06/24/2020   Tendonitis, Achilles, right 05/27/2019   Controlled substance agreement signed 11/22/2018   Blood glucose elevated 11/22/2018   Vitamin D deficiency 11/22/2018   Sensorineural hearing loss (SNHL), bilateral 06/17/2018   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   Mixed dyslipidemia 08/08/2017   Abnormal EKG 08/08/2017   Cervical somatic dysfunction 03/06/2016   Hemorrhoid thrombosis 03/06/2016   Generalized anxiety disorder 03/06/2016   Left knee pain 11/05/2014   Allergic rhinitis 06/18/2013   Morbid obesity ([place]) 06/18/2013   BPH (benign prostatic hypertrophy) 07/03/2012   Essential hypertension, benign 07/03/2012   Depression, recurrent ([place]) 07/03/2012   GERD (gastroesophageal reflux disease) 07/03/2012   Testosterone deficiency 07/03/2012    Arthur Foster, Arthur Foster, Arthur Foster 09/05/2021, 2:10 PM  [facility] [address] [place], [place], [address] Phone: [phone]   Fax:  [phone]  Name: Arthur Foster MRN: [id number] Date of Birth: 10-16-1950

## 2021-09-08 ENCOUNTER — Ambulatory Visit: Payer: Medicare Other | Admitting: Physical Therapy

## 2021-09-08 ENCOUNTER — Encounter: Payer: Self-pay | Admitting: Physical Therapy

## 2021-09-08 DIAGNOSIS — M25662 Stiffness of left knee, not elsewhere classified: Secondary | ICD-10-CM

## 2021-09-08 DIAGNOSIS — R600 Localized edema: Secondary | ICD-10-CM

## 2021-09-08 DIAGNOSIS — M25562 Pain in left knee: Secondary | ICD-10-CM

## 2021-09-08 NOTE — Therapy (Signed)
[facility] [place], [place], [address] Phone: [phone]   Fax:  [phone]  Physical Therapy Treatment  Patient Details  Name: Arthur Foster MRN: [id number] Date of Birth: Dec 26, 1950 Referring Provider (PT): Edmonia Lynch MD   Encounter Date: 09/08/2021   PT End of Session - 09/08/21 0819     Visit Number 8    Number of Visits 12    Date for PT Re-Evaluation 09/26/21    Authorization Type FOTO AT LEAST EVERY 5TH VISIT.  PROGRESS NOTE AT 10TH VISIT.  KX MODIFIER AFTER 15 VISITS.    PT Start Time [phone]    PT Stop Time 0902    PT Time Calculation (min) 45 min    Activity Tolerance Patient tolerated treatment well    Behavior During Therapy WFL for tasks assessed/performed             Past Medical History:  Diagnosis Date   Anxiety    BPH (benign prostatic hyperplasia)    Depression    Hyperlipidemia    Hypertension    Testosterone deficiency     Past Surgical History:  Procedure Laterality Date   FOOT SURGERY     HERNIA REPAIR Left inguinal    There were no vitals filed for this visit.   Subjective Assessment - 09/08/21 0817     Subjective Reports great relief after last session with stim pads over the medial gastroc.    Pertinent History Prior right foot surgery, HTN, hernia repair, left partial knee replacement.  CC continues to be pain upon rising from sitting.    How long can you sit comfortably? Unlimited.    How long can you walk comfortably? Not an issue.    Patient Stated Goals Sit to stand with low pain-level.    Currently in Pain? No/denies                [facility] PT Assessment - 09/08/21 0001       Assessment   Medical Diagnosis Left hamstring and gastroc strain.    Referring Provider (PT) Edmonia Lynch MD    Next MD Visit 04/2022      Precautions   Precaution Comments No ultrasound.                           [facility] Adult PT Treatment/Exercise - 09/08/21 0001        Knee/Hip Exercises: Standing   Heel Raises Both;20 reps   eccentric heel raise for stretch x20 reps 4" step   Rocker Board 5 minutes      Knee/Hip Exercises: Seated   Other Seated Knee/Hip Exercises Rockerboard for soleus stretch x5 min      Modalities   Modalities Electrical Stimulation;Moist Heat      Moist Heat Therapy   Number Minutes Moist Heat 10 Minutes    Moist Heat Location Knee      Electrical Stimulation   Electrical Stimulation Location L medial gastroc/HS    Electrical Stimulation Action Pre-Mod    Electrical Stimulation Parameters 80-150 hz x10 min    Electrical Stimulation Goals Tone      Manual Therapy   Manual Therapy Myofascial release    Myofascial Release Deep STW/IASTW to L gastroc, medial HS                          PT Long Term Goals - 09/02/21 1136  PT LONG TERM GOAL #1   Title Independent with a HEP.    Time 6    Period Weeks    Status On-going      PT LONG TERM GOAL #2   Title Perform ADL's with pain not > 3/10.    Time 6    Period Weeks    Status On-going      PT LONG TERM GOAL #3   Title Sit to stand with pain not > 2/10.    Time 6    Period Weeks    Status On-going                   Plan - 09/08/21 0916     Clinical Impression Statement Patient presented in clinic with reports of less pain since last treatment. Patient states that last session the electrode placement for e-stim was over medial calf and knee and felt much better. Patient denying any lateral knee or ITB pain at this time. Patient able to note a greater stretch with eccentric heel raises and reported more tenderness (7-8/10) with deep STW to medial gastroc and HS region. Normal modalities response noted following removal of the modalities.    Personal Factors and Comorbidities Comorbidity 1;Other    Comorbidities Prior right foot surgery, HTN, hernia repair, left partial knee replacement.    Examination-Activity Limitations  Other;Locomotion Level    Examination-Participation Restrictions Other    Stability/Clinical Decision Making Stable/Uncomplicated    Rehab Potential Excellent    PT Frequency 2x / week    PT Duration 6 weeks    PT Treatment/Interventions ADLs/Self Care Home Management;Cryotherapy;Electrical Stimulation;Moist Heat;Iontophoresis '4mg'$ /ml Dexamethasone;Gait training;Functional mobility training;Therapeutic exercise;Therapeutic activities;Patient/family education;Manual techniques;Vasopneumatic Device    PT Next Visit Plan STW/M, Rockerboard, heel raises, pain-free hamstring curls.  HMP/CP/Vaso and electrical stimulation.    Consulted and Agree with Plan of Care Patient             Patient will benefit from skilled therapeutic intervention in order to improve the following deficits and impairments:  Pain, Abnormal gait, Decreased range of motion, Decreased activity tolerance, Increased muscle spasms  Visit Diagnosis: Acute pain of left knee  Localized edema  Stiffness of left knee, not elsewhere classified     Problem List Patient Active Problem List   Diagnosis Date Noted   Preop examination 01/11/2021   Back pain 06/24/2020   Dysuria 06/24/2020   Tendonitis, Achilles, right 05/27/2019   Controlled substance agreement signed 11/22/2018   Blood glucose elevated 11/22/2018   Vitamin D deficiency 11/22/2018   Sensorineural hearing loss (SNHL), bilateral 06/17/2018   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   Mixed dyslipidemia 08/08/2017   Abnormal EKG 08/08/2017   Cervical somatic dysfunction 03/06/2016   Hemorrhoid thrombosis 03/06/2016   Generalized anxiety disorder 03/06/2016   Left knee pain 11/05/2014   Allergic rhinitis 06/18/2013   Morbid obesity ([place]) 06/18/2013   BPH (benign prostatic hypertrophy) 07/03/2012   Essential hypertension, benign 07/03/2012   Depression, recurrent ([place]) 07/03/2012   GERD (gastroesophageal reflux disease) 07/03/2012   Testosterone  deficiency 07/03/2012   Rationale for Evaluation and Treatment Rehabilitation  Standley Brooking, PTA 09/08/2021, 9:19 AM  [facility] [address] [place], [place], [address] Phone: [phone]   Fax:  [phone]  Name: Arthur Foster MRN: [id number] Date of Birth: Nov 17, 1950

## 2021-09-09 ENCOUNTER — Encounter: Payer: No Typology Code available for payment source | Admitting: *Deleted

## 2021-09-13 ENCOUNTER — Encounter: Payer: Self-pay | Admitting: Physical Therapy

## 2021-09-13 ENCOUNTER — Ambulatory Visit: Payer: Medicare Other | Admitting: Physical Therapy

## 2021-09-13 DIAGNOSIS — M25562 Pain in left knee: Secondary | ICD-10-CM

## 2021-09-13 DIAGNOSIS — R600 Localized edema: Secondary | ICD-10-CM | POA: Diagnosis not present

## 2021-09-13 DIAGNOSIS — M25662 Stiffness of left knee, not elsewhere classified: Secondary | ICD-10-CM | POA: Diagnosis not present

## 2021-09-13 NOTE — Therapy (Signed)
[facility] [place], [place], [address] Phone: [phone]   Fax:  [phone]  Physical Therapy Treatment  Patient Details  Name: Arthur Foster MRN: [id number] Date of Birth: [birth year] Referring Provider (PT): Edmonia Lynch MD   Encounter Date: 09/13/2021   PT End of Session - 09/13/21 0922     Visit Number 9    Number of Visits 12    Date for PT Re-Evaluation 09/26/21    Authorization Type FOTO AT LEAST EVERY 5TH VISIT.  PROGRESS NOTE AT 10TH VISIT.  KX MODIFIER AFTER 15 VISITS.    PT Start Time 0818    PT Stop Time 0905    PT Time Calculation (min) 47 min    Activity Tolerance Patient tolerated treatment well    Behavior During Therapy WFL for tasks assessed/performed             Past Medical History:  Diagnosis Date   Anxiety    BPH (benign prostatic hyperplasia)    Depression    Hyperlipidemia    Hypertension    Testosterone deficiency     Past Surgical History:  Procedure Laterality Date   FOOT SURGERY     HERNIA REPAIR Left inguinal    There were no vitals filed for this visit.   Subjective Assessment - 09/13/21 0915     Subjective Has some bruising along medial HS but has pain again with standing.    Pertinent History Prior right foot surgery, HTN, hernia repair, left partial knee replacement.  CC continues to be pain upon rising from sitting.    How long can you sit comfortably? Unlimited.    How long can you walk comfortably? Not an issue.    Patient Stated Goals Sit to stand with low pain-level.    Currently in Pain? Yes    Pain Score --   no pain score provided   Pain Location Knee    Pain Orientation Left    Pain Descriptors / Indicators Discomfort    Pain Type Acute pain    Pain Onset More than a month ago    Pain Frequency Intermittent    Aggravating Factors  sit > stands                [facility] PT Assessment - 09/13/21 0001       Assessment   Medical Diagnosis Left  hamstring and gastroc strain.    Referring Provider (PT) Edmonia Lynch MD    Next MD Visit 04/2022      Precautions   Precaution Comments No ultrasound.                           [facility] Adult PT Treatment/Exercise - 09/13/21 0001       Knee/Hip Exercises: Stretches   Active Hamstring Stretch Left;5 reps;20 seconds      Knee/Hip Exercises: Aerobic   Recumbent Bike Level 3 x 12 minutes.      Knee/Hip Exercises: Standing   Knee Flexion Strengthening;Left;20 reps;10 reps;Limitations    Knee Flexion Limitations 3#    Rocker Board 5 minutes    Other Standing Knee Exercises mod. RLE deadlift 10# to box x15 reps    Other Standing Knee Exercises RLE sit <>stand x15 reps      Modalities   Modalities Electrical Stimulation;Vasopneumatic      Electrical Stimulation   Electrical Stimulation Location L medial gastroc/HS    Electrical Stimulation Action pre-Mod  Electrical Stimulation Parameters 80-150 hz x10 in    Electrical Stimulation Goals Pain;Tone      Vasopneumatic   Number Minutes Vasopneumatic  10 minutes    Vasopnuematic Location  Knee    Vasopneumatic Pressure Low    Vasopneumatic Temperature  34                          PT Long Term Goals - 09/02/21 1136       PT LONG TERM GOAL #1   Title Independent with a HEP.    Time 6    Period Weeks    Status On-going      PT LONG TERM GOAL #2   Title Perform ADL's with pain not > 3/10.    Time 6    Period Weeks    Status On-going      PT LONG TERM GOAL #3   Title Sit to stand with pain not > 2/10.    Time 6    Period Weeks    Status On-going                   Plan - 09/13/21 0926     Clinical Impression Statement Patient presented in clinic with return of pain of R HS. Patient progressed to more stretching and eccentric strengthening. Ecchymosis noted over medial HS which was primary focus of IASTW in last PT sessin. Normal modalities response noted following removal of  the modalities. Patient denied any pain upon standing following treatment.    Personal Factors and Comorbidities Comorbidity 1;Other    Comorbidities Prior right foot surgery, HTN, hernia repair, left partial knee replacement.    Examination-Activity Limitations Other;Locomotion Level    Examination-Participation Restrictions Other    Stability/Clinical Decision Making Stable/Uncomplicated    Rehab Potential Excellent    PT Frequency 2x / week    PT Duration 6 weeks    PT Treatment/Interventions ADLs/Self Care Home Management;Cryotherapy;Electrical Stimulation;Moist Heat;Iontophoresis '4mg'$ /ml Dexamethasone;Gait training;Functional mobility training;Therapeutic exercise;Therapeutic activities;Patient/family education;Manual techniques;Vasopneumatic Device    PT Next Visit Plan STW/M, Rockerboard, heel raises, pain-free hamstring curls.  HMP/CP/Vaso and electrical stimulation.    Consulted and Agree with Plan of Care Patient             Patient will benefit from skilled therapeutic intervention in order to improve the following deficits and impairments:  Pain, Abnormal gait, Decreased range of motion, Decreased activity tolerance, Increased muscle spasms  Visit Diagnosis: Acute pain of left knee  Localized edema  Stiffness of left knee, not elsewhere classified     Problem List Patient Active Problem List   Diagnosis Date Noted   Preop examination 01/11/2021   Back pain 06/24/2020   Dysuria 06/24/2020   Tendonitis, Achilles, right 05/27/2019   Controlled substance agreement signed 11/22/2018   Blood glucose elevated 11/22/2018   Vitamin D deficiency 11/22/2018   Sensorineural hearing loss (SNHL), bilateral 06/17/2018   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   Mixed dyslipidemia 08/08/2017   Abnormal EKG 08/08/2017   Cervical somatic dysfunction 03/06/2016   Hemorrhoid thrombosis 03/06/2016   Generalized anxiety disorder 03/06/2016   Left knee pain 11/05/2014   Allergic  rhinitis 06/18/2013   Morbid obesity ([place]) 06/18/2013   BPH (benign prostatic hypertrophy) 07/03/2012   Essential hypertension, benign 07/03/2012   Depression, recurrent ([place]) 07/03/2012   GERD (gastroesophageal reflux disease) 07/03/2012   Testosterone deficiency 07/03/2012   Rationale for Evaluation and Treatment Rehabilitation   Orianna Biskup P  Laveda Abbe, PTA 09/13/2021, 9:32 AM  [facility] [address] [place], [place], [address] Phone: [phone]   Fax:  [phone]  Name: Arthur Foster MRN: [id number] Date of Birth: [birth year]

## 2021-09-16 ENCOUNTER — Ambulatory Visit: Payer: Medicare Other | Admitting: Physical Therapy

## 2021-09-16 ENCOUNTER — Encounter: Payer: Self-pay | Admitting: Physical Therapy

## 2021-09-16 DIAGNOSIS — M25562 Pain in left knee: Secondary | ICD-10-CM | POA: Diagnosis not present

## 2021-09-16 DIAGNOSIS — M25662 Stiffness of left knee, not elsewhere classified: Secondary | ICD-10-CM | POA: Diagnosis not present

## 2021-09-16 DIAGNOSIS — R600 Localized edema: Secondary | ICD-10-CM | POA: Diagnosis not present

## 2021-09-16 NOTE — Therapy (Addendum)
[facility] [place], [place], [address] Phone: [phone]   Fax:  [phone]  Physical Therapy Treatment  Patient Details  Name: Arthur Foster MRN: [id number] Date of Birth: July 29, 1950 Referring Provider (PT): Edmonia Lynch MD   Encounter Date: 09/16/2021   PT End of Session - 09/16/21 1020     Visit Number 10    Number of Visits 12    Date for PT Re-Evaluation 09/26/21    Authorization Type FOTO AT LEAST EVERY 5TH VISIT.  PROGRESS NOTE AT 10TH VISIT.  KX MODIFIER AFTER 15 VISITS.    PT Start Time 0808    PT Stop Time 0900    PT Time Calculation (min) 52 min    Activity Tolerance Patient tolerated treatment well    Behavior During Therapy WFL for tasks assessed/performed             Past Medical History:  Diagnosis Date   Anxiety    BPH (benign prostatic hyperplasia)    Depression    Hyperlipidemia    Hypertension    Testosterone deficiency     Past Surgical History:  Procedure Laterality Date   FOOT SURGERY     HERNIA REPAIR Left inguinal    There were no vitals filed for this visit.   Subjective Assessment - 09/16/21 1021     Subjective No new complaints.    Pertinent History Prior right foot surgery, HTN, hernia repair, left partial knee replacement.  CC continues to be pain upon rising from sitting.    How long can you sit comfortably? Unlimited.    How long can you walk comfortably? Not an issue.    Patient Stated Goals Sit to stand with low pain-level.    Currently in Pain? Yes    Pain Score 2     Pain Location Knee    Pain Orientation Left    Pain Descriptors / Indicators Dull;Aching    Pain Type Acute pain    Pain Onset More than a month ago                               [facility] Adult PT Treatment/Exercise - 09/16/21 0001       Exercises   Exercises Knee/Hip      Knee/Hip Exercises: Aerobic   Recumbent Bike Level 3 x 11 minutes.      Knee/Hip Exercises:  Standing   Other Standing Knee Exercises Rockerboard x 5 minutes.      Acupuncturist Location Left lateral gastroc head    Electrical Stimulation Action IFC at 80-150 Hz.    Electrical Stimulation Parameters 40% scan x 20 minutes.      Manual Therapy   Manual Therapy Soft tissue mobilization    Soft tissue mobilization STW/M x 10 minutes to patient's left lateral gastroc head to reduce tone.                          PT Long Term Goals - 09/16/21 1026       PT LONG TERM GOAL #1   Title Independent with a HEP.    Time 6    Period Weeks    Status On-going      PT LONG TERM GOAL #2   Title Perform ADL's with pain not > 3/10.    Baseline On most days.    Time 6  Period Weeks    Status Partially Met      PT LONG TERM GOAL #3   Title Sit to stand with pain not > 2/10.    Baseline Equal to right knee.    Time 6    Period Weeks    Status Partially Met                   Plan - 09/16/21 1024     Clinical Impression Statement Patient still exhibiting tone over his left lateral gastroc head.  He did well with treatment and felt better after treatment.    Personal Factors and Comorbidities Comorbidity 1;Other    Comorbidities Prior right foot surgery, HTN, hernia repair, left partial knee replacement.    Examination-Activity Limitations Other;Locomotion Level    Examination-Participation Restrictions Other    Stability/Clinical Decision Making Stable/Uncomplicated    Rehab Potential Excellent    PT Frequency 2x / week    PT Duration 6 weeks    PT Treatment/Interventions ADLs/Self Care Home Management;Cryotherapy;Electrical Stimulation;Moist Heat;Iontophoresis 79m/ml Dexamethasone;Gait training;Functional mobility training;Therapeutic exercise;Therapeutic activities;Patient/family education;Manual techniques;Vasopneumatic Device    PT Next Visit Plan STW/M, Rockerboard, heel raises, pain-free hamstring curls.   HMP/CP/Vaso and electrical stimulation.    Consulted and Agree with Plan of Care Patient             Patient will benefit from skilled therapeutic intervention in order to improve the following deficits and impairments:  Pain, Abnormal gait, Decreased range of motion, Decreased activity tolerance, Increased muscle spasms  Visit Diagnosis: Acute pain of left knee     Problem List Patient Active Problem List   Diagnosis Date Noted   Preop examination 01/11/2021   Back pain 06/24/2020   Dysuria 06/24/2020   Tendonitis, Achilles, right 05/27/2019   Controlled substance agreement signed 11/22/2018   Blood glucose elevated 11/22/2018   Vitamin D deficiency 11/22/2018   Sensorineural hearing loss (SNHL), bilateral 06/17/2018   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  Progress Note Reporting Period 08/15/21 to 09/16/21.  See note below for Objective Data and Assessment of Progress/Goals. Good progress toward goals.  Continued tone exhibited over left lateral gastroc head region.      Santasia Rew, CMali PT 09/16/2021, 10:27 AM  [facility] [address]Phone: [phone]  Fax:  [phone] Name: Arthur TappMRN: [id number] of Birth: 103/27/1952

## 2021-09-19 ENCOUNTER — Ambulatory Visit: Payer: Medicare Other | Admitting: Physical Therapy

## 2021-09-19 DIAGNOSIS — R600 Localized edema: Secondary | ICD-10-CM | POA: Diagnosis not present

## 2021-09-19 DIAGNOSIS — M25562 Pain in left knee: Secondary | ICD-10-CM | POA: Diagnosis not present

## 2021-09-19 DIAGNOSIS — M25662 Stiffness of left knee, not elsewhere classified: Secondary | ICD-10-CM | POA: Diagnosis not present

## 2021-09-19 NOTE — Therapy (Addendum)
[facility] [place], [place], [address] Phone: [phone]   Fax:  [phone]  Physical Therapy Treatment  Patient Details  Name: Arthur Foster MRN: [id number] Date of Birth: Aug 19, 1950 Referring Provider (PT): Edmonia Lynch MD   Encounter Date: 09/19/2021   PT End of Session - 09/19/21 1013     Visit Number 11    Number of Visits 18    Date for PT Re-Evaluation 10/17/21    Authorization Type FOTO AT LEAST EVERY 5TH VISIT.  PROGRESS NOTE AT 10TH VISIT.  KX MODIFIER AFTER 15 VISITS.    PT Start Time 0815    PT Stop Time 0907    PT Time Calculation (min) 52 min    Activity Tolerance Patient tolerated treatment well    Behavior During Therapy WFL for tasks assessed/performed             Past Medical History:  Diagnosis Date   Anxiety    BPH (benign prostatic hyperplasia)    Depression    Hyperlipidemia    Hypertension    Testosterone deficiency     Past Surgical History:  Procedure Laterality Date   FOOT SURGERY     HERNIA REPAIR Left inguinal    There were no vitals filed for this visit.   Subjective Assessment - 09/19/21 1004     Subjective Getting better.  Patient would like to continue.    Pertinent History Prior right foot surgery, HTN, hernia repair, left partial knee replacement.  CC continues to be pain upon rising from sitting.    How long can you sit comfortably? Unlimited.    How long can you walk comfortably? Not an issue.    Patient Stated Goals Sit to stand with low pain-level.    Currently in Pain? Yes    Pain Score 2     Pain Location Knee    Pain Orientation Left    Pain Descriptors / Indicators Dull    Pain Type Acute pain    Pain Onset More than a month ago                               [facility] Adult PT Treatment/Exercise - 09/19/21 0001       Exercises   Exercises Knee/Hip      Knee/Hip Exercises: Aerobic   Recumbent Bike Level 3 x 10 minutes.       Modalities   Modalities Electrical Stimulation;Moist Heat      Moist Heat Therapy   Number Minutes Moist Heat 20 Minutes    Moist Heat Location --   Left calf.     Acupuncturist Location Left lateral gastroc.    Electrical Stimulation Action IFC at 80-150 Hz.    Electrical Stimulation Parameters 40% scan x 20 minutes.    Electrical Stimulation Goals Pain;Tone      Manual Therapy   Manual Therapy Soft tissue mobilization    Soft tissue mobilization STW/M x 13 minutes to patient's left lateral head of Gastroc. to reduce tone.                          PT Long Term Goals - 09/16/21 1026       PT LONG TERM GOAL #1   Title Independent with a HEP.    Time 6    Period Weeks    Status On-going  PT LONG TERM GOAL #2   Title Perform ADL's with pain not > 3/10.    Baseline On most days.    Time 6    Period Weeks    Status Partially Met      PT LONG TERM GOAL #3   Title Sit to stand with pain not > 2/10.    Baseline Equal to right knee.    Time 6    Period Weeks    Status Partially Met                   Plan - 09/19/21 1014     Clinical Impression Statement Patient reporting he is getting better.  Stating less pain going from sit to stand.    Personal Factors and Comorbidities Comorbidity 1;Other    Comorbidities Prior right foot surgery, HTN, hernia repair, left partial knee replacement.    Examination-Activity Limitations Other;Locomotion Level    Examination-Participation Restrictions Other    Stability/Clinical Decision Making Stable/Uncomplicated    Rehab Potential Excellent    PT Frequency 2x / week    PT Duration 6 weeks    PT Treatment/Interventions ADLs/Self Care Home Management;Cryotherapy;Electrical Stimulation;Moist Heat;Iontophoresis 17m/ml Dexamethasone;Gait training;Functional mobility training;Therapeutic exercise;Therapeutic activities;Patient/family education;Manual techniques;Vasopneumatic  Device    PT Next Visit Plan STW/M to left lateral Gastroc.    Consulted and Agree with Plan of Care Patient             Patient will benefit from skilled therapeutic intervention in order to improve the following deficits and impairments:  Pain, Abnormal gait, Decreased range of motion, Decreased activity tolerance, Increased muscle spasms  Visit Diagnosis: Acute pain of left knee     Problem List Patient Active Problem List   Diagnosis Date Noted   Preop examination 01/11/2021   Back pain 06/24/2020   Dysuria 06/24/2020   Tendonitis, Achilles, right 05/27/2019   Controlled substance agreement signed 11/22/2018   Blood glucose elevated 11/22/2018   Vitamin D deficiency 11/22/2018   Sensorineural hearing loss (SNHL), bilateral 06/17/2018   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   Mixed dyslipidemia 08/08/2017   Abnormal EKG 08/08/2017   Cervical somatic dysfunction 03/06/2016   Hemorrhoid thrombosis 03/06/2016   Generalized anxiety disorder 03/06/2016   Left knee pain 11/05/2014   Allergic rhinitis 06/18/2013   Morbid obesity ([place] 06/18/2013   BPH (benign prostatic hypertrophy) 07/03/2012   Essential hypertension, benign 07/03/2012   Depression, recurrent ([place] 07/03/2012   GERD (gastroesophageal reflux disease) 07/03/2012   Testosterone deficiency 07/03/2012   Rationale for Evaluation and Treatment Rehabilitation.  [name], CMali PT 09/19/2021, 10:18 AM  [name] [address]Phone: [phone]  Fax:  [phone] Name: RMaximino CozzolinoMRN: [id number] of Birth: 108-30-1952

## 2021-09-19 NOTE — Patient Instructions (Signed)
[place] Created by Mali Kenora Spayd Jun 19th, 2023 View at [email or web address] using code: [email or web address] Total 1 Page 1 of 1 Bridges While laying on your back, contract the low abdominals and lift from the hips. Repeat 15 Times Hold 2 Seconds Complete 2 Sets Perform 2 Times a Day

## 2021-09-21 ENCOUNTER — Ambulatory Visit: Payer: Medicare Other | Admitting: Physical Therapy

## 2021-09-21 DIAGNOSIS — M25562 Pain in left knee: Secondary | ICD-10-CM

## 2021-09-21 DIAGNOSIS — M25662 Stiffness of left knee, not elsewhere classified: Secondary | ICD-10-CM | POA: Diagnosis not present

## 2021-09-21 DIAGNOSIS — R600 Localized edema: Secondary | ICD-10-CM | POA: Diagnosis not present

## 2021-09-21 NOTE — Therapy (Addendum)
[facility] [place], [place], [address] Phone: [phone]   Fax:  [phone]  Physical Therapy Treatment  Patient Details  Name: Arthur Foster MRN: [id number] Date of Birth: [birth year] Referring Provider (PT): Edmonia Lynch MD   Encounter Date: 09/21/2021   PT End of Session - 09/21/21 0854     Visit Number 12    Number of Visits 18    Date for PT Re-Evaluation 10/17/21    Authorization Type FOTO AT LEAST EVERY 5TH VISIT.  PROGRESS NOTE AT 10TH VISIT.  KX MODIFIER AFTER 15 VISITS.    PT Start Time 0815    PT Stop Time 0905    PT Time Calculation (min) 50 min    Activity Tolerance Patient tolerated treatment well    Behavior During Therapy WFL for tasks assessed/performed             Past Medical History:  Diagnosis Date   Anxiety    BPH (benign prostatic hyperplasia)    Depression    Hyperlipidemia    Hypertension    Testosterone deficiency     Past Surgical History:  Procedure Laterality Date   FOOT SURGERY     HERNIA REPAIR Left inguinal    There were no vitals filed for this visit.   Subjective Assessment - 09/21/21 0854     Subjective Getting better.  Less pain after getting up from sitting.    Pertinent History Prior right foot surgery, HTN, hernia repair, left partial knee replacement.  CC continues to be pain upon rising from sitting.    How long can you sit comfortably? Unlimited.    How long can you walk comfortably? Not an issue.    Patient Stated Goals Sit to stand with low pain-level.    Currently in Pain? Yes    Pain Score 2     Pain Location Knee    Pain Orientation Left    Pain Type Acute pain    Pain Onset More than a month ago                               [facility] Adult PT Treatment/Exercise - 09/21/21 0001       Exercises   Exercises Knee/Hip      Knee/Hip Exercises: Aerobic   Recumbent Bike Level 3 x 10 minutes.      Knee/Hip Exercises: Standing    Other Standing Knee Exercises Rockerboard x 5 minutes.      Modalities   Modalities Electrical Stimulation;Moist Heat      Moist Heat Therapy   Number Minutes Moist Heat 20 Minutes    Moist Heat Location --   Left calf.     Acupuncturist Location Left lateral gastroc head    Electrical Stimulation Action IFC at 80-150 Hz.    Electrical Stimulation Parameters 40% scan x 20 minutes.    Electrical Stimulation Goals Pain;Tone      Manual Therapy   Manual Therapy Soft tissue mobilization    Soft tissue mobilization STW/M x 8 minutes to patient's left lateral gastroc head including IASTM.                          PT Long Term Goals - 09/16/21 1026       PT LONG TERM GOAL #1   Title Independent with a HEP.    Time 6  Period Weeks    Status On-going      PT LONG TERM GOAL #2   Title Perform ADL's with pain not > 3/10.    Baseline On most days.    Time 6    Period Weeks    Status Partially Met      PT LONG TERM GOAL #3   Title Sit to stand with pain not > 2/10.    Baseline Equal to right knee.    Time 6    Period Weeks    Status Partially Met                   Plan - 09/21/21 1021     Clinical Impression Statement Patient responding very well to soft tissue work.  He states that now after sitting for awhile and then getting up to walk he knee feels better quicker.    Personal Factors and Comorbidities Comorbidity 1;Other    Comorbidities Prior right foot surgery, HTN, hernia repair, left partial knee replacement.    Examination-Activity Limitations Other;Locomotion Level    Examination-Participation Restrictions Other    Stability/Clinical Decision Making Stable/Uncomplicated    Rehab Potential Excellent    PT Frequency 2x / week    PT Duration 6 weeks    PT Treatment/Interventions ADLs/Self Care Home Management;Cryotherapy;Electrical Stimulation;Moist Heat;Iontophoresis 35m/ml Dexamethasone;Gait  training;Functional mobility training;Therapeutic exercise;Therapeutic activities;Patient/family education;Manual techniques;Vasopneumatic Device    PT Next Visit Plan STW/M to left lateral Gastroc.    Consulted and Agree with Plan of Care Patient             Patient will benefit from skilled therapeutic intervention in order to improve the following deficits and impairments:  Pain, Abnormal gait, Decreased range of motion, Decreased activity tolerance, Increased muscle spasms  Visit Diagnosis: Acute pain of left knee     Problem List Patient Active Problem List   Diagnosis Date Noted   Preop examination 01/11/2021   Back pain 06/24/2020   Dysuria 06/24/2020   Tendonitis, Achilles, right 05/27/2019   Controlled substance agreement signed 11/22/2018   Blood glucose elevated 11/22/2018   Vitamin D deficiency 11/22/2018   Sensorineural hearing loss (SNHL), bilateral 06/17/2018   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   Mixed dyslipidemia 08/08/2017   Abnormal EKG 08/08/2017   Cervical somatic dysfunction 03/06/2016   Hemorrhoid thrombosis 03/06/2016   Generalized anxiety disorder 03/06/2016   Left knee pain 11/05/2014   Allergic rhinitis 06/18/2013   Morbid obesity ([place] 06/18/2013   BPH (benign prostatic hypertrophy) 07/03/2012   Essential hypertension, benign 07/03/2012   Depression, recurrent ([place] 07/03/2012   GERD (gastroesophageal reflux disease) 07/03/2012   Testosterone deficiency 07/03/2012   Rationale for Evaluation and Treatment Rehabilitation.  [name], CMali PT 09/21/2021, 10:25 AM  [facility]493 Lakeshore StreetMFripp [place] [address]Phone: [phone]  Fax:  [phone] Name: Arthur GomillionMRN: [id number] of Birth: 127-Apr-1952

## 2021-09-26 ENCOUNTER — Ambulatory Visit: Payer: Medicare Other | Admitting: Physical Therapy

## 2021-09-26 DIAGNOSIS — M25662 Stiffness of left knee, not elsewhere classified: Secondary | ICD-10-CM | POA: Diagnosis not present

## 2021-09-26 DIAGNOSIS — M25562 Pain in left knee: Secondary | ICD-10-CM

## 2021-09-26 DIAGNOSIS — R600 Localized edema: Secondary | ICD-10-CM | POA: Diagnosis not present

## 2021-09-29 ENCOUNTER — Ambulatory Visit: Payer: Medicare Other | Admitting: Physical Therapy

## 2021-09-29 DIAGNOSIS — M25562 Pain in left knee: Secondary | ICD-10-CM | POA: Diagnosis not present

## 2021-09-29 DIAGNOSIS — R600 Localized edema: Secondary | ICD-10-CM | POA: Diagnosis not present

## 2021-09-29 DIAGNOSIS — M25662 Stiffness of left knee, not elsewhere classified: Secondary | ICD-10-CM | POA: Diagnosis not present

## 2021-09-29 NOTE — Therapy (Signed)
[facility] [place], [place], [address] Phone: [phone]   Fax:  [phone]  Physical Therapy Treatment  Patient Details  Name: Arthur Foster MRN: [id number] Date of Birth: Sep 18, 1950 Referring Provider (PT): Edmonia Lynch MD   Encounter Date: 09/29/2021   PT End of Session - 09/29/21 1318     Visit Number 14    Number of Visits 18    Authorization Type FOTO AT LEAST EVERY 5TH VISIT.  PROGRESS NOTE AT 10TH VISIT.  KX MODIFIER AFTER 15 VISITS.    PT Start Time 0815    PT Stop Time 0904    PT Time Calculation (min) 49 min    Activity Tolerance Patient tolerated treatment well    Behavior During Therapy WFL for tasks assessed/performed             Past Medical History:  Diagnosis Date   Anxiety    BPH (benign prostatic hyperplasia)    Depression    Hyperlipidemia    Hypertension    Testosterone deficiency     Past Surgical History:  Procedure Laterality Date   FOOT SURGERY     HERNIA REPAIR Left inguinal    There were no vitals filed for this visit.   Subjective Assessment - 09/29/21 1319     Subjective About the same since last visit.    Pertinent History Prior right foot surgery, HTN, hernia repair, left partial knee replacement.  CC continues to be pain upon rising from sitting.    How long can you sit comfortably? Unlimited.    Patient Stated Goals Sit to stand with low pain-level.    Currently in Pain? Yes    Pain Score 2     Pain Location Knee    Pain Orientation Left    Pain Descriptors / Indicators Dull    Pain Type Acute pain                               [facility] Adult PT Treatment/Exercise - 09/29/21 0001       Exercises   Exercises Knee/Hip      Knee/Hip Exercises: Aerobic   Recumbent Bike Level 3 x 10 minutes.      Knee/Hip Exercises: Standing   Other Standing Knee Exercises Rockerbaord x 5 minutes.      Acupuncturist  Location Left lateral head of gastroc    Electrical Stimulation Action IFC at 80-150 Hz.    Electrical Stimulation Parameters 40% scan x 15 minutes.    Electrical Stimulation Goals Pain;Tone      Manual Therapy   Manual Therapy Soft tissue mobilization    Soft tissue mobilization STW/M x 8 minutes to patient's left lateral head of gastroc to reduce tone.                          PT Long Term Goals - 09/16/21 1026       PT LONG TERM GOAL #1   Title Independent with a HEP.    Time 6    Period Weeks    Status On-going      PT LONG TERM GOAL #2   Title Perform ADL's with pain not > 3/10.    Baseline On most days.    Time 6    Period Weeks    Status Partially Met      PT LONG TERM  GOAL #3   Title Sit to stand with pain not > 2/10.    Baseline Equal to right knee.    Time 6    Period Weeks    Status Partially Met                   Plan - 09/29/21 1321     Clinical Impression Statement Patient about the same since last treatment but overall is doing much better.  He has some remaining tone in his left lateral head of gastroc.    Personal Factors and Comorbidities Comorbidity 1;Other    Comorbidities Prior right foot surgery, HTN, hernia repair, left partial knee replacement.    Examination-Activity Limitations Other;Locomotion Level    Rehab Potential Excellent    PT Frequency 2x / week    PT Duration 6 weeks    PT Treatment/Interventions ADLs/Self Care Home Management;Cryotherapy;Electrical Stimulation;Moist Heat;Iontophoresis 61m/ml Dexamethasone;Gait training;Functional mobility training;Therapeutic exercise;Therapeutic activities;Patient/family education;Manual techniques;Vasopneumatic Device    PT Next Visit Plan STW/M to left lateral Gastroc.    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  Arthur Foster, CMali PT 09/29/2021, 1:24 PM  [facility] [address]Phone: [phone]  Fax:  [phone] Name: Arthur RybarczykMRN: [id number] of Birth: 112/03/1951

## 2021-10-05 ENCOUNTER — Encounter: Payer: Self-pay | Admitting: Physical Therapy

## 2021-10-05 ENCOUNTER — Ambulatory Visit: Payer: Medicare Other | Attending: Orthopedic Surgery | Admitting: Physical Therapy

## 2021-10-05 DIAGNOSIS — R600 Localized edema: Secondary | ICD-10-CM | POA: Insufficient documentation

## 2021-10-05 DIAGNOSIS — M25662 Stiffness of left knee, not elsewhere classified: Secondary | ICD-10-CM | POA: Diagnosis not present

## 2021-10-05 DIAGNOSIS — M25562 Pain in left knee: Secondary | ICD-10-CM | POA: Insufficient documentation

## 2021-10-05 NOTE — Therapy (Addendum)
OUTPATIENT PHYSICAL THERAPY TREATMENT NOTE   Patient Name: Arthur Foster MRN: [id number] DOB:1950-08-13, 71 y.o., male Today's Date: 10/05/2021  REFERRING PROVIDER: Edmonia Lynch MD   PT End of Session - 10/05/21 0816     Visit Number 15    Number of Visits 18    Date for PT Re-Evaluation 10/17/21    PT Start Time 0809    PT Stop Time 0900    PT Time Calculation (min) 51 min    Activity Tolerance Patient tolerated treatment well    Behavior During Therapy [facility] for tasks assessed/performed             Past Medical History:  Diagnosis Date   Anxiety    BPH (benign prostatic hyperplasia)    Depression    Hyperlipidemia    Hypertension    Testosterone deficiency    Past Surgical History:  Procedure Laterality Date   FOOT SURGERY     HERNIA REPAIR Left inguinal   Patient Active Problem List   Diagnosis Date Noted   Preop examination 01/11/2021   Back pain 06/24/2020   Dysuria 06/24/2020   Tendonitis, Achilles, right 05/27/2019   Controlled substance agreement signed 11/22/2018   Blood glucose elevated 11/22/2018   Vitamin D deficiency 11/22/2018   Sensorineural hearing loss (SNHL), bilateral 06/17/2018   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  THERAPY DIAG:  Acute pain of left knee  Rationale for Evaluation and Treatment Rehabilitation  PERTINENT HISTORY: Unicompartmental left knee replacement.  PRECAUTIONS: No  ultrasound.  SUBJECTIVE: On zero turn mower.  When got off a few times knee felt like it would give way.  PAIN:  Are you having pain? Yes: NPRS scale: 1/10     TODAY'S TREATMENT:  Bike x 10 minutes f/b rockerboard x 4 minutes f/b calf raises x STW/M x 9 minutes to affected calf f/b Pre-mod e'stim.       PT Long Term Goals - 10/05/21 0820       PT LONG TERM GOAL #1   Title Independent with a HEP.    Time 6    Period Weeks    Status On-going      PT LONG TERM GOAL #2   Title Perform ADL's with pain not > 3/10.    Baseline On most days.    Time 6    Period Weeks    Status Partially Met      PT LONG TERM GOAL #3   Title Sit to stand with pain not > 2/10.    Baseline Equal to right knee.    Time 6    Period Weeks    Status Partially Met              Plan - 10/05/21 0824   Clinical impression statement: Low pain today.  Tol tx without complaint.  Personal Factors and Comorbidities Comorbidity 1;Other    Comorbidities Prior right foot surgery, HTN, hernia repair, left partial knee replacement.    Stability/Clinical Decision Making Stable/Uncomplicated  Rehab Potential Excellent    PT Frequency 2x / week    PT Treatment/Interventions ADLs/Self Care Home Management;Cryotherapy;Electrical Stimulation;Moist Heat;Iontophoresis 95m/ml Dexamethasone;Gait training;Functional mobility training;Therapeutic exercise;Therapeutic activities;Patient/family education;Manual techniques;Vasopneumatic Device    PT Next Visit Plan STW/M to left lateral Gastroc.    Consulted and Agree with Plan of Care Patient               Corretta Munce, CMali PT 10/05/2021, 9:01 AM

## 2021-10-10 ENCOUNTER — Ambulatory Visit: Payer: Medicare Other | Admitting: Physical Therapy

## 2021-10-10 ENCOUNTER — Encounter: Payer: Self-pay | Admitting: Physical Therapy

## 2021-10-10 DIAGNOSIS — R600 Localized edema: Secondary | ICD-10-CM | POA: Diagnosis not present

## 2021-10-10 DIAGNOSIS — M25562 Pain in left knee: Secondary | ICD-10-CM

## 2021-10-10 DIAGNOSIS — M25662 Stiffness of left knee, not elsewhere classified: Secondary | ICD-10-CM | POA: Diagnosis not present

## 2021-10-10 NOTE — Therapy (Addendum)
OUTPATIENT PHYSICAL THERAPY TREATMENT NOTE   Patient Name: Arthur Foster MRN: [id number] DOB:Oct 06, 1950, 71 y.o., male Today's Date: 10/10/2021  REFERRING PROVIDER: Edmonia Lynch MD   PT End of Session - 10/10/21 0959     Visit Number 16    Number of Visits 18    Date for PT Re-Evaluation 10/17/21    Authorization Type FOTO AT LEAST EVERY 5TH VISIT.  PROGRESS NOTE AT 10TH VISIT.  KX MODIFIER AFTER 15 VISITS.    PT Start Time 0900    PT Stop Time 7124    PT Time Calculation (min) 44 min    Activity Tolerance Patient tolerated treatment well    Behavior During Therapy WFL for tasks assessed/performed             Past Medical History:  Diagnosis Date   Anxiety    BPH (benign prostatic hyperplasia)    Depression    Hyperlipidemia    Hypertension    Testosterone deficiency    Past Surgical History:  Procedure Laterality Date   FOOT SURGERY     HERNIA REPAIR Left inguinal   Patient Active Problem List   Diagnosis Date Noted   Preop examination 01/11/2021   Back pain 06/24/2020   Dysuria 06/24/2020   Tendonitis, Achilles, right 05/27/2019   Controlled substance agreement signed 11/22/2018   Blood glucose elevated 11/22/2018   Vitamin D deficiency 11/22/2018   Sensorineural hearing loss (SNHL), bilateral 06/17/2018   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  THERAPY DIAG:  Acute pain of left knee  Rationale for  Evaluation and Treatment Rehabilitation  PERTINENT HISTORY: Unicompartmental left knee replacement.  PRECAUTIONS: No ultrasound.  SUBJECTIVE: Patient had to get down on his knees and do a job and when he got up he was in a great deal of pain for about 7 steps. PAIN:  Are you having pain? Yes: NPRS scale: 1/10     TODAY'S TREATMENT:  Bike x 10 minutes f/b rockerboard x  minutes f/b 5 minutes f/b calf raises x 3 minutes f/b STW/M x 5 minutes to affected calf f/b pre-mod e'stim at 80-150 Hz x 15 minutes.         PT Long Term Goals - 10/05/21 0820       PT LONG TERM GOAL #1   Title Independent with a HEP.    Time 6    Period Weeks    Status On-going      PT LONG TERM GOAL #2   Title Perform ADL's with pain not > 3/10.    Baseline On most days.    Time 6    Period Weeks    Status Partially Met      PT LONG TERM GOAL #3   Title Sit to stand with pain not > 2/10.    Baseline Equal to right knee.    Time 6    Period Weeks    Status Partially Met  Plan - 10/10/21 0824   Clinical impression statement: On knee recently producing a lot of pain after rising for about 7 steps.  He felt good after treatment today.  Personal Factors and Comorbidities Comorbidity 1;Other    Comorbidities Prior right foot surgery, HTN, hernia repair, left partial knee replacement.    Stability/Clinical Decision Making Stable/Uncomplicated    Rehab Potential Excellent    PT Frequency 2x / week    PT Treatment/Interventions ADLs/Self Care Home Management;Cryotherapy;Electrical Stimulation;Moist Heat;Iontophoresis 19m/ml Dexamethasone;Gait training;Functional mobility training;Therapeutic exercise;Therapeutic activities;Patient/family education;Manual techniques;Vasopneumatic Device    PT Next Visit Plan STW/M to left lateral Gastroc.    Consulted and Agree with Plan of Care Patient               Arthur Foster, CMali PT 10/10/2021, 10:01 AM

## 2021-10-12 ENCOUNTER — Other Ambulatory Visit: Payer: Self-pay | Admitting: Nurse Practitioner

## 2021-10-12 DIAGNOSIS — F339 Major depressive disorder, recurrent, unspecified: Secondary | ICD-10-CM

## 2021-10-12 DIAGNOSIS — F411 Generalized anxiety disorder: Secondary | ICD-10-CM

## 2021-10-13 ENCOUNTER — Encounter: Payer: No Typology Code available for payment source | Admitting: Physical Therapy

## 2021-10-17 ENCOUNTER — Ambulatory Visit: Payer: Medicare Other | Admitting: Physical Therapy

## 2021-10-17 ENCOUNTER — Encounter: Payer: Self-pay | Admitting: Physical Therapy

## 2021-10-17 DIAGNOSIS — M25562 Pain in left knee: Secondary | ICD-10-CM | POA: Diagnosis not present

## 2021-10-17 DIAGNOSIS — R600 Localized edema: Secondary | ICD-10-CM

## 2021-10-17 DIAGNOSIS — M25662 Stiffness of left knee, not elsewhere classified: Secondary | ICD-10-CM | POA: Diagnosis not present

## 2021-10-17 NOTE — Therapy (Signed)
OUTPATIENT PHYSICAL THERAPY TREATMENT NOTE   Patient Name: Arthur Foster MRN: [id number] DOB:Oct 09, 1950, 71 y.o., male Today's Date: 10/17/2021  REFERRING PROVIDER: Edmonia Lynch MD   PT End of Session - 10/17/21 0832     Visit Number 17    Number of Visits 18    Date for PT Re-Evaluation 10/17/21    Authorization Type FOTO AT LEAST EVERY 5TH VISIT.  PROGRESS NOTE AT 10TH VISIT.  KX MODIFIER AFTER 15 VISITS.    PT Start Time 0816    PT Stop Time 0907    PT Time Calculation (min) 51 min    Activity Tolerance Patient tolerated treatment well    Behavior During Therapy WFL for tasks assessed/performed             Past Medical History:  Diagnosis Date   Anxiety    BPH (benign prostatic hyperplasia)    Depression    Hyperlipidemia    Hypertension    Testosterone deficiency    Past Surgical History:  Procedure Laterality Date   FOOT SURGERY     HERNIA REPAIR Left inguinal   Patient Active Problem List   Diagnosis Date Noted   Preop examination 01/11/2021   Back pain 06/24/2020   Dysuria 06/24/2020   Tendonitis, Achilles, right 05/27/2019   Controlled substance agreement signed 11/22/2018   Blood glucose elevated 11/22/2018   Vitamin D deficiency 11/22/2018   Sensorineural hearing loss (SNHL), bilateral 06/17/2018   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   Mixed dyslipidemia 08/08/2017   Abnormal EKG 08/08/2017   Cervical somatic dysfunction 03/06/2016   Hemorrhoid thrombosis 03/06/2016   Generalized anxiety disorder 03/06/2016   Left knee pain 11/05/2014   Allergic rhinitis 06/18/2013   Morbid obesity ([place]) 06/18/2013   BPH (benign prostatic hypertrophy) 07/03/2012   Essential hypertension, benign 07/03/2012   Depression, recurrent ([place]) 07/03/2012   GERD (gastroesophageal reflux disease) 07/03/2012   Testosterone deficiency 07/03/2012    REFERRING DIAG: Left hamstring and gastoc strain.  THERAPY DIAG:  Acute pain of left knee  Localized  edema  Stiffness of left knee, not elsewhere classified  Rationale for Evaluation and Treatment Rehabilitation  PERTINENT HISTORY: Unicompartmental left knee replacement.  PRECAUTIONS: No ultrasound.  SUBJECTIVE: Used legs to pull recliner seat down and felt pain in back of leg and knee ("hamstring.") PAIN:  Are you having pain? Yes: NPRS scale: 1/10     TODAY'S TREATMENT:  Bike x 10 minutes f/b ham curls with 40# x 3 minutes f/b STW/M x 10 minutes to affected left calf and hamstrings /b pre-mod e'stim at 80-150 Hz x 20 minutes.         PT Long Term Goals - 10/05/21 0820       PT LONG TERM GOAL #1   Title Independent with a HEP.    Time 6    Period Weeks    Status On-going      PT LONG TERM GOAL #2   Title Perform ADL's with pain not > 3/10.    Baseline On most days.    Time 6    Period Weeks    Status Partially Met      PT LONG TERM GOAL #3   Title Sit to stand with pain not > 2/10.    Baseline Equal to right knee.    Time 6    Period Weeks    Status Partially Met              Plan - 10/17/21 4098  Clinical impression statement: Patient felt pain in left posterior knee and hamstrings when he used bilateral knee flexion to ring recliner leg rest down.  He did well with STW/M to his left hamstrings today.  Personal Factors and Comorbidities Comorbidity 1;Other    Comorbidities Prior right foot surgery, HTN, hernia repair, left partial knee replacement.    Stability/Clinical Decision Making Stable/Uncomplicated    Rehab Potential Excellent    PT Frequency 2x / week    PT Treatment/Interventions ADLs/Self Care Home Management;Cryotherapy;Electrical Stimulation;Moist Heat;Iontophoresis 71m/ml Dexamethasone;Gait training;Functional mobility training;Therapeutic exercise;Therapeutic activities;Patient/family education;Manual techniques;Vasopneumatic Device    PT Next Visit Plan STW/M to left lateral Gastroc.    Consulted and Agree with Plan of Care Patient                Shenica Holzheimer, CMali PT 10/17/2021, 10:16 AM

## 2021-10-19 ENCOUNTER — Ambulatory Visit: Payer: Medicare Other | Admitting: Physical Therapy

## 2021-10-19 ENCOUNTER — Encounter: Payer: Self-pay | Admitting: Physical Therapy

## 2021-10-19 DIAGNOSIS — M25662 Stiffness of left knee, not elsewhere classified: Secondary | ICD-10-CM | POA: Diagnosis not present

## 2021-10-19 DIAGNOSIS — R600 Localized edema: Secondary | ICD-10-CM

## 2021-10-19 DIAGNOSIS — M25562 Pain in left knee: Secondary | ICD-10-CM | POA: Diagnosis not present

## 2021-10-19 NOTE — Therapy (Signed)
OUTPATIENT PHYSICAL THERAPY TREATMENT NOTE   Patient Name: Arthur Foster MRN: [id number] DOB:1950-10-17, 71 y.o., male Today's Date: 10/19/2021  REFERRING PROVIDER: Edmonia Lynch MD   PT End of Session - 10/19/21 0850     Visit Number 18    Number of Visits 18    Date for PT Re-Evaluation 10/17/21    Authorization Type FOTO AT LEAST EVERY 5TH VISIT.  PROGRESS NOTE AT 10TH VISIT.  KX MODIFIER AFTER 15 VISITS.    PT Start Time [phone]    Activity Tolerance Patient tolerated treatment well    Behavior During Therapy [facility] for tasks assessed/performed             Past Medical History:  Diagnosis Date   Anxiety    BPH (benign prostatic hyperplasia)    Depression    Hyperlipidemia    Hypertension    Testosterone deficiency    Past Surgical History:  Procedure Laterality Date   FOOT SURGERY     HERNIA REPAIR Left inguinal   Patient Active Problem List   Diagnosis Date Noted   Preop examination 01/11/2021   Back pain 06/24/2020   Dysuria 06/24/2020   Tendonitis, Achilles, right 05/27/2019   Controlled substance agreement signed 11/22/2018   Blood glucose elevated 11/22/2018   Vitamin D deficiency 11/22/2018   Sensorineural hearing loss (SNHL), bilateral 06/17/2018   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  THERAPY DIAG:  Acute pain of left knee  Localized edema  Stiffness of left knee, not elsewhere classified  Rationale  for Evaluation and Treatment Rehabilitation  PERTINENT HISTORY: Unicompartmental left knee replacement.  PRECAUTIONS: No ultrasound.  SUBJECTIVE: Last day. PAIN:  Are you having pain? Yes: NPRS scale: 1/10     TODAY'S TREATMENT:  Bike x 10 minutes f/b ham curls with 40# x 45mnutes f/b STW/M x 10 minutes to affected left calf and hamstrings /b pre-mod e'stim at 80-150 Hz x 15 minutes.         PT Long Term Goals - 10/05/21 0820       PT LONG TERM GOAL #1   Title Independent with a HEP.    Time 6    Period Weeks    Status On-going      PT LONG TERM GOAL #2   Title Perform ADL's with pain not > 3/10.    Baseline On most days.    Time 6    Period Weeks    Status Partially Met      PT LONG TERM GOAL #3   Title Sit to stand with pain not > 2/10.    Baseline Equal to right knee.    Time 6    Period Weeks    Status Partially Met              Plan - 10/17/21 0824   Clinical impression statement: See discharge summary.  Personal Factors and Comorbidities Comorbidity 1;Other    Comorbidities Prior right foot surgery, HTN, hernia repair, left partial knee replacement.  Stability/Clinical Decision Making Stable/Uncomplicated    Rehab Potential Excellent    PT Frequency 2x / week    PT Treatment/Interventions ADLs/Self Care Home Management;Cryotherapy;Electrical Stimulation;Moist Heat;Iontophoresis 49m/ml Dexamethasone;Gait training;Functional mobility training;Therapeutic exercise;Therapeutic activities;Patient/family education;Manual techniques;Vasopneumatic Device    PT Next Visit Plan STW/M to left lateral Gastroc.    Consulted and Agree with Plan of Care Patient             PHYSICAL THERAPY DISCHARGE SUMMARY  Visits from Start of Care: 18.  Current functional level related to goals / functional outcomes: See above.   Remaining deficits: Patient pleased with his progress but he still experience posterior left knee pain  when going from sit to stand.    Education / Equipment: HEP.   Patient agrees to discharge. Patient goals were partially met. Patient is being discharged due to  completing course of PT.   Rael Yo, CMali PT 10/19/2021, 8:55 AM

## 2021-10-24 ENCOUNTER — Ambulatory Visit (INDEPENDENT_AMBULATORY_CARE_PROVIDER_SITE_OTHER): Payer: Medicare Other | Admitting: Nurse Practitioner

## 2021-10-24 ENCOUNTER — Encounter: Payer: Self-pay | Admitting: Nurse Practitioner

## 2021-10-24 DIAGNOSIS — F339 Major depressive disorder, recurrent, unspecified: Secondary | ICD-10-CM

## 2021-10-24 DIAGNOSIS — F411 Generalized anxiety disorder: Secondary | ICD-10-CM

## 2021-10-24 DIAGNOSIS — I10 Essential (primary) hypertension: Secondary | ICD-10-CM | POA: Diagnosis not present

## 2021-10-24 DIAGNOSIS — R7989 Other specified abnormal findings of blood chemistry: Secondary | ICD-10-CM | POA: Diagnosis not present

## 2021-10-24 MED ORDER — ALPRAZOLAM 0.25 MG PO TABS: ORAL_TABLET | ORAL | 3 refills | Status: DC

## 2021-10-24 MED ORDER — LISINOPRIL 20 MG PO TABS: 20.0000 mg | ORAL_TABLET | Freq: Every day | ORAL | 1 refills | Status: DC

## 2021-10-24 MED ORDER — ESCITALOPRAM OXALATE 20 MG PO TABS: 20.0000 mg | ORAL_TABLET | Freq: Every day | ORAL | 1 refills | Status: DC

## 2021-10-24 NOTE — Assessment & Plan Note (Signed)
Symptoms well controlled on alprazolam 0.25 mg tablet by mouth as needed.  Completed GAD-7.  Follow-up in 6 months

## 2021-10-24 NOTE — Progress Notes (Signed)
Established Patient Office Visit  Subjective   Patient ID: Arthur Foster, male    DOB: 12/03/1950  Age: 71 y.o. MRN: [id number]  Chief Complaint  Patient presents with   Medication Refill    HPI  Pt presents for follow up of hypertension. Patient was diagnosed in 07/03/2012.  The patient is tolerating the medication well without side effects. Compliance with treatment has been good; including taking medication as directed , maintains a healthy diet and regular exercise regimen , and following up as directed.   Depression, Follow-up  He  was last seen for this 6 months ago. Changes made at last visit include alprazolam 0.25 mg tablet by mouth as needed for anxiety.[name]   He reports good compliance with treatment. He is not having side effects.   He reports good tolerance of treatment. Current symptoms include: depressed mood He feels he is Improved since last visit.     10/24/2021    7:55 AM 04/25/2021    8:10 AM 03/02/2021    9:11 AM  Depression screen PHQ 2/9  Decreased Interest 0 0 0  Down, Depressed, Hopeless 0 0 0  PHQ - 2 Score 0 0 0  Altered sleeping  0   Tired, decreased energy  0   Change in appetite  0   Feeling bad or failure about yourself   0   Trouble concentrating  0   Moving slowly or fidgety/restless  0   Suicidal thoughts  0   PHQ-9 Score  0   Difficult doing work/chores  Not difficult at all      Anxiety, Follow-up  He was last seen for anxiety 6 months ago. Changes made at last visit include alprazolam 0.25 mg tablet by mouth as needed.[name]   He reports good compliance with treatment. He reports good tolerance of treatment. He is not having side effects.   He feels his anxiety is moderate and Improved since last visit.  Symptoms: No chest pain No difficulty concentrating  No dizziness No fatigue  No feelings of losing control No insomnia  No irritable No palpitations  No panic attacks No racing thoughts  No shortness of breath No sweating  No  tremors/shakes    GAD-7 Results    10/24/2021    7:55 AM 04/25/2021    8:10 AM 09/01/2020    8:50 AM  GAD-7 Generalized Anxiety Disorder Screening Tool  1. Feeling Nervous, Anxious, or on Edge 0 0 0  2. Not Being Able to Stop or Control Worrying 0 0 0  3. Worrying Too Much About Different Things 0 0 0  4. Trouble Relaxing 0 0 0  5. Being So Restless it's Hard To Sit Still 0 0 0  6. Becoming Easily Annoyed or Irritable 0 0 0  7. Feeling Afraid As If Something Awful Might Happen 0 0 0  Total GAD-7 Score 0 0 0  Difficulty At Work, Home, or Getting  Along With Others?  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   Mixed dyslipidemia 08/08/2017   Abnormal EKG 08/08/2017   Cervical somatic dysfunction 03/06/2016   Hemorrhoid thrombosis 03/06/2016   Generalized anxiety disorder 03/06/2016   Left knee pain 11/05/2014   Allergic rhinitis 06/18/2013   Morbid obesity ([place]) 06/18/2013   BPH (benign prostatic hypertrophy) 07/03/2012   Essential hypertension, benign 07/03/2012   Depression, recurrent ([place]) 07/03/2012   GERD (gastroesophageal reflux disease) 07/03/2012   Testosterone deficiency 07/03/2012   Past Medical History:  Diagnosis Date   Anxiety    BPH (benign prostatic hyperplasia)    Depression    Hyperlipidemia    Hypertension    Testosterone deficiency    Past Surgical History:  Procedure Laterality Date   FOOT SURGERY     HERNIA REPAIR Left inguinal   Social History    Tobacco Use   Smoking status: Former    Types: Cigarettes    Quit date: 1998    Years since quitting: 25.5   Smokeless tobacco: Former    Types: Chew    Quit date: 2002  Vaping Use   Vaping Use: Never used  Substance Use Topics   Alcohol use: Yes    Alcohol/week: 14.0 standard drinks of alcohol    Types: 14 Cans of beer per week   Drug use: No   Social History   Socioeconomic History   Marital status: Married    Spouse name: [name]   Number of children: 3   Years of education: 12   Highest education level: High school graduate  Occupational History   Occupation: retired    Comment: works a few hours a week  Tobacco Use   Smoking status: Former    Types: Cigarettes    Quit date: 1998    Years since quitting: 25.5   Smokeless tobacco: Former    Types: Chew    Quit date: 2002  Vaping Use   Vaping Use: Never used  Substance and Sexual Activity   Alcohol use: Yes    Alcohol/week: 14.0 standard drinks of alcohol    Types: 14 Cans of beer per week   Drug use: No   Sexual activity: Yes    Birth control/protection: None  Other Topics Concern   Not on file  Social History Narrative   3 daughters, 2 live in [place]. 6 grandchildren.   Was married x 33 years prior to first wife's death.   Remarried to [employer].   Social Determinants of Health   Financial Resource Strain: Low Risk  (03/02/2021)   Overall Financial Resource Strain (CARDIA)    Difficulty of Paying Living Expenses: Not hard at all  Food Insecurity: No Food Insecurity (03/02/2021)   Hunger Vital Sign    Worried About Running Out of Food in the Last Year: Never true    Ran Out of Food in the Last Year: Never true  Transportation Needs: No Transportation Needs (03/02/2021)   PRAPARE - Hydrologist (Medical): No    Lack of Transportation (Non-Medical): No  Physical Activity: Sufficiently Active (03/02/2021)   Exercise Vital Sign    Days of Exercise per Week: 5 days     Minutes of Exercise per Session: 30 min  Stress: No Stress Concern Present (03/02/2021)   [place]    Feeling of Stress : Not at all  Social Connections: Socially Isolated (03/02/2021)  Social Licensed conveyancer [NHANES]    Frequency of Communication with Friends and Family: More than three times a week    Frequency of Social Gatherings with Friends and Family: More than three times a week    Attends Religious Services: Never    Marine scientist or Organizations: No    Attends Archivist Meetings: Never    Marital Status: Widowed  Intimate Partner Violence: Not At Risk (03/02/2021)   Humiliation, Afraid, Rape, and Kick questionnaire    Fear of Current or Ex-Partner: No    Emotionally Abused: No    Physically Abused: No    Sexually Abused: No   Family Status  Relation Name Status   Mother  Deceased at age 77   Father  Deceased   MGM  Deceased   MGF  Deceased   PGM  Deceased   PGF  Deceased   Family History  Problem Relation Age of Onset   Hypertension Mother    Alzheimer's disease Mother    Hypertension Father    Alzheimer's disease Father    Allergies  Allergen Reactions   Bee Venom    Other    Short Ragweed Pollen Ext    Ambien [Zolpidem Tartrate] Other (See Comments)      ROS    Objective:     BP 133/86   Pulse 88   Temp 98.6 F (37 C)   Ht _0  (1.854 m)   Wt 286 lb 6.4 oz (129.9 kg)   SpO2 96%   BMI 37.79 kg/m  BP Readings from Last 3 Encounters:  10/24/21 133/86  04/25/21 119/69  01/11/21 136/82   Wt Readings from Last 3 Encounters:  10/24/21 286 lb 6.4 oz (129.9 kg)  04/25/21 290 lb (131.5 kg)  03/02/21 (!) 314 lb (142.4 kg)   SpO2 Readings from Last 3 Encounters:  10/24/21 96%  01/11/21 97%  10/08/20 97%      Physical Exam   No results found for any visits on 10/24/21.  Last CBC Lab Results  Component Value Date   WBC 7.9  04/25/2021   HGB 15.6 04/25/2021   HCT 46.4 04/25/2021   MCV 90 04/25/2021   MCH 30.4 04/25/2021   RDW 12.2 04/25/2021   PLT 373 95/28/4132   Last metabolic panel Lab Results  Component Value Date   GLUCOSE 105 (H) 04/25/2021   NA 140 04/25/2021   K 5.2 04/25/2021   CL 104 04/25/2021   CO2 24 04/25/2021   BUN 17 04/25/2021   CREATININE 1.13 04/25/2021   EGFR 71 04/25/2021   CALCIUM 9.6 04/25/2021   PROT 7.0 04/25/2021   ALBUMIN 4.4 04/25/2021   LABGLOB 2.6 04/25/2021   AGRATIO 1.7 04/25/2021   BILITOT 1.1 04/25/2021   ALKPHOS 72 04/25/2021   AST 16 04/25/2021   ALT 23 04/25/2021   Last lipids Lab Results  Component Value Date   CHOL 198 04/25/2021   HDL 45 04/25/2021   LDLCALC 129 (H) 04/25/2021   TRIG 134 04/25/2021   CHOLHDL 4.4 04/25/2021   Last hemoglobin A1c Lab Results  Component Value Date   HGBA1C 5.8 (H) 05/25/2020   Last thyroid functions Lab Results  Component Value Date   TSH 1.970 05/26/2019   T4TOTAL 6.5 05/26/2019   Last vitamin D Lab Results  Component Value Date   VD25OH 31.6 11/24/2019      The 10-year ASCVD risk score (Arnett DK, et al., 2019) is: 20.3%  Assessment & Plan:   Problem List Items Addressed This Visit       Cardiovascular and Mediastinum   Essential hypertension, benign    Patient's hypertension well-controlled on current medication no changes necessary.  Completed labs today CBC, CMP lipid panel results pending.      Relevant Medications   lisinopril (ZESTRIL) 20 MG tablet   Other Relevant Orders   Lipid Panel   CBC with Differential   Comprehensive metabolic panel     Other   Depression, recurrent (HCC)    Symptoms well controlled no changes necessary.  Completed PHQ-9.  Follow-up in 6 months      Relevant Medications   escitalopram (LEXAPRO) 20 MG tablet   ALPRAZolam (XANAX) 0.25 MG tablet   Generalized anxiety disorder    Symptoms well controlled on alprazolam 0.25 mg tablet by mouth as needed.   Completed GAD-7.  Follow-up in 6 months      Relevant Medications   escitalopram (LEXAPRO) 20 MG tablet   ALPRAZolam (XANAX) 0.25 MG tablet   Other Visit Diagnoses     Low testosterone in male           Return in about 6 months (around 04/26/2022).    Ivy Lynn, NP

## 2021-10-24 NOTE — Assessment & Plan Note (Signed)
Patient's hypertension well-controlled on current medication no changes necessary.  Completed labs today CBC, CMP lipid panel results pending.

## 2021-10-24 NOTE — Patient Instructions (Signed)
Generalized Anxiety Disorder, Adult ?Generalized anxiety disorder (GAD) is a mental health condition. Unlike normal worries, anxiety related to GAD is not triggered by a specific event. These worries do not fade or get better with time. GAD interferes with relationships, work, and school. ?GAD symptoms can vary from mild to severe. People with severe GAD can have intense waves of anxiety with physical symptoms that are similar to panic attacks. ?What are the causes? ?The exact cause of GAD is not known, but the following are believed to have an impact: ?Differences in natural brain chemicals. ?Genes passed down from parents to children. ?Differences in the way threats are perceived. ?Development and stress during childhood. ?Personality. ?What increases the risk? ?The following factors may make you more likely to develop this condition: ?Being male. ?Having a family history of anxiety disorders. ?Being very shy. ?Experiencing very stressful life events, such as the death of a loved one. ?Having a very stressful family environment. ?What are the signs or symptoms? ?People with GAD often worry excessively about many things in their lives, such as their health and family. Symptoms may also include: ?Mental and emotional symptoms: ?Worrying excessively about natural disasters. ?Fear of being late. ?Difficulty concentrating. ?Fears that others are judging your performance. ?Physical symptoms: ?Fatigue. ?Headaches, muscle tension, muscle twitches, trembling, or feeling shaky. ?Feeling like your heart is pounding or beating very fast. ?Feeling out of breath or like you cannot take a deep breath. ?Having trouble falling asleep or staying asleep, or experiencing restlessness. ?Sweating. ?Nausea, diarrhea, or irritable bowel syndrome (IBS). ?Behavioral symptoms: ?Experiencing erratic moods or irritability. ?Avoidance of new situations. ?Avoidance of people. ?Extreme difficulty making decisions. ?How is this diagnosed? ?This  condition is diagnosed based on your symptoms and medical history. You will also have a physical exam. Your health care provider may perform tests to rule out other possible causes of your symptoms. ?To be diagnosed with GAD, a person must have anxiety that: ?Is out of his or her control. ?Affects several different aspects of his or her life, such as work and relationships. ?Causes distress that makes him or her unable to take part in normal activities. ?Includes at least three symptoms of GAD, such as restlessness, fatigue, trouble concentrating, irritability, muscle tension, or sleep problems. ?Before your health care provider can confirm a diagnosis of GAD, these symptoms must be present more days than they are not, and they must last for 6 months or longer. ?How is this treated? ?This condition may be treated with: ?Medicine. Antidepressant medicine is usually prescribed for long-term daily control. Anti-anxiety medicines may be added in severe cases, especially when panic attacks occur. ?Talk therapy (psychotherapy). Certain types of talk therapy can be helpful in treating GAD by providing support, education, and guidance. Options include: ?Cognitive behavioral therapy (CBT). People learn coping skills and self-calming techniques to ease their physical symptoms. They learn to identify unrealistic thoughts and behaviors and to replace them with more appropriate thoughts and behaviors. ?Acceptance and commitment therapy (ACT). This treatment teaches people how to be mindful as a way to cope with unwanted thoughts and feelings. ?Biofeedback. This process trains you to manage your body's response (physiological response) through breathing techniques and relaxation methods. You will work with a therapist while machines are used to monitor your physical symptoms. ?Stress management techniques. These include yoga, meditation, and exercise. ?A mental health specialist can help determine which treatment is best for you.  Some people see improvement with one type of therapy. However, other people require   a combination of therapies. Follow these instructions at home: Lifestyle Maintain a consistent routine and schedule. Anticipate stressful situations. Create a plan and allow extra time to work with your plan. Practice stress management or self-calming techniques that you have learned from your therapist or your health care provider. Exercise regularly and spend time outdoors. Eat a healthy diet that includes plenty of vegetables, fruits, whole grains, low-fat dairy products, and lean protein. Do not eat a lot of foods that are high in fat, added sugar, or salt (sodium). Drink plenty of water. Avoid alcohol. Alcohol can increase anxiety. Avoid caffeine and certain over-the-counter cold medicines. These may make you feel worse. Ask your pharmacist which medicines to avoid. General instructions Take over-the-counter and prescription medicines only as told by your health care provider. Understand that you are likely to have setbacks. Accept this and be kind to yourself as you persist to take better care of yourself. Anticipate stressful situations. Create a plan and allow extra time to work with your plan. Recognize and accept your accomplishments, even if you judge them as small. Spend time with people who care about you. Keep all follow-up visits. This is important. Where to find more information [place]: https://carter.com/ Substance Abuse and Mental Health Services: ktimeonline.com Contact a health care provider if: Your symptoms do not get better. Your symptoms get worse. You have signs of depression, such as: A persistently sad or irritable mood. Loss of enjoyment in activities that used to bring you joy. Change in weight or eating. Changes in sleeping habits. Get help right away if: You have thoughts about hurting yourself or others. If you ever feel like you may hurt  yourself or others, or have thoughts about taking your own life, get help right away. Go to your nearest emergency department or: Call your local emergency services (911 in the [place] Call a suicide crisis helpline, such as the [place] at (260)322-7775 or 988 in the [place]. This is open 24 hours a day in the [place] Text the Crisis Text Line at 401-294-8477 (in the [place].). Summary Generalized anxiety disorder (GAD) is a mental health condition that involves worry that is not triggered by a specific event. People with GAD often worry excessively about many things in their lives, such as their health and family. GAD may cause symptoms such as restlessness, trouble concentrating, sleep problems, frequent sweating, nausea, diarrhea, headaches, and trembling or muscle twitching. A mental health specialist can help determine which treatment is best for you. Some people see improvement with one type of therapy. However, other people require a combination of therapies. This information is not intended to replace advice given to you by your health care provider. Make sure you discuss any questions you have with your health care provider. Document Revised: 10/13/2020 Document Reviewed: 07/11/2020 Elsevier Patient Education  [place]. Hypertension, Adult Hypertension is another name for high blood pressure. High blood pressure forces your heart to work harder to pump blood. This can cause problems over time. There are two numbers in a blood pressure reading. There is a top number (systolic) over a bottom number (diastolic). It is best to have a blood pressure that is below 120/80. What are the causes? The cause of this condition is not known. Some other conditions can lead to high blood pressure. What increases the risk? Some lifestyle factors can make you more likely to develop high blood pressure: Smoking. Not getting enough exercise or physical activity. Being  overweight. Having too much fat, sugar, calories, or salt (sodium) in your diet. Drinking too much alcohol. Other risk factors include: Having any of these conditions: Heart disease. Diabetes. High cholesterol. Kidney disease. Obstructive sleep apnea. Having a family history of high blood pressure and high cholesterol. Age. The risk increases with age. Stress. What are the signs or symptoms? High blood pressure may not cause symptoms. Very high blood pressure (hypertensive crisis) may cause: Headache. Fast or uneven heartbeats (palpitations). Shortness of breath. Nosebleed. Vomiting or feeling like you may vomit (nauseous). Changes in how you see. Very bad chest pain. Feeling dizzy. Seizures. How is this treated? This condition is treated by making healthy lifestyle changes, such as: Eating healthy foods. Exercising more. Drinking less alcohol. Your doctor may prescribe medicine if lifestyle changes do not help enough and if: Your top number is above 130. Your bottom number is above 80. Your personal target blood pressure may vary. Follow these instructions at home: Eating and drinking  If told, follow the DASH eating plan. To follow this plan: Fill one half of your plate at each meal with fruits and vegetables. Fill one fourth of your plate at each meal with whole grains. Whole grains include whole-wheat pasta, brown rice, and whole-grain bread. Eat or drink low-fat dairy products, such as skim milk or low-fat yogurt. Fill one fourth of your plate at each meal with low-fat (lean) proteins. Low-fat proteins include fish, chicken without skin, eggs, beans, and tofu. Avoid fatty meat, cured and processed meat, or chicken with skin. Avoid pre-made or processed food. Limit the amount of salt in your diet to less than 1,500 mg each day. Do not drink alcohol if: Your doctor tells you not to drink. You are pregnant, may be pregnant, or are planning to become pregnant. If you  drink alcohol: Limit how much you have to: 0-1 drink a day for women. 0-2 drinks a day for men. Know how much alcohol is in your drink. In the [place] one drink equals one 12 oz bottle of beer (355 mL), one 5 oz glass of wine (148 mL), or one 1 oz glass of hard liquor (44 mL). Lifestyle  Work with your doctor to stay at a healthy weight or to lose weight. Ask your doctor what the best weight is for you. Get at least 30 minutes of exercise that causes your heart to beat faster (aerobic exercise) most days of the week. This may include walking, swimming, or biking. Get at least 30 minutes of exercise that strengthens your muscles (resistance exercise) at least 3 days a week. This may include lifting weights or doing Pilates. Do not smoke or use any products that contain nicotine or tobacco. If you need help quitting, ask your doctor. Check your blood pressure at home as told by your doctor. Keep all follow-up visits. Medicines Take over-the-counter and prescription medicines only as told by your doctor. Follow directions carefully. Do not skip doses of blood pressure medicine. The medicine does not work as well if you skip doses. Skipping doses also puts you at risk for problems. Ask your doctor about side effects or reactions to medicines that you should watch for. Contact a doctor if: You think you are having a reaction to the medicine you are taking. You have headaches that keep coming back. You feel dizzy. You have swelling in your ankles. You have trouble with your vision. Get help right away if: You get a very bad headache. You start to feel mixed  up (confused). You feel weak or numb. You feel faint. You have very bad pain in your: Chest. Belly (abdomen). You vomit more than once. You have trouble breathing. These symptoms may be an emergency. Get help right away. Call 911. Do not wait to see if the symptoms will go away. Do not drive yourself to the  hospital. Summary Hypertension is another name for high blood pressure. High blood pressure forces your heart to work harder to pump blood. For most people, a normal blood pressure is less than 120/80. Making healthy choices can help lower blood pressure. If your blood pressure does not get lower with healthy choices, you may need to take medicine. This information is not intended to replace advice given to you by your health care provider. Make sure you discuss any questions you have with your health care provider. Document Revised: 01/06/2021 Document Reviewed: 01/06/2021 Elsevier Patient Education  [place].

## 2021-10-24 NOTE — Assessment & Plan Note (Signed)
Symptoms well controlled no changes necessary.  Completed PHQ-9.  Follow-up in 6 months

## 2021-10-25 ENCOUNTER — Other Ambulatory Visit: Payer: Self-pay | Admitting: Nurse Practitioner

## 2021-10-25 DIAGNOSIS — R7309 Other abnormal glucose: Secondary | ICD-10-CM

## 2021-10-25 LAB — LIPID PANEL
Chol/HDL Ratio: 4.1 ratio (ref 0.0–5.0)
Cholesterol, Total: 206 mg/dL — ABNORMAL HIGH (ref 100–199)
HDL: 50 mg/dL (ref >39)
LDL Chol Calc (NIH): 131 mg/dL — ABNORMAL HIGH (ref 0–99)
Triglycerides: 139 mg/dL (ref 0–149)
VLDL Cholesterol Cal: 25 mg/dL (ref 5–40)

## 2021-10-25 LAB — CBC WITH DIFFERENTIAL/PLATELET
Basophils Absolute: 0.1 10*3/uL (ref 0.0–0.2)
Basos: 1 %
EOS (ABSOLUTE): 0.3 10*3/uL (ref 0.0–0.4)
Eos: 3 %
Hematocrit: 46.9 % (ref 37.5–51.0)
Hemoglobin: 15.2 g/dL (ref 13.0–17.7)
Immature Grans (Abs): 0.1 10*3/uL (ref 0.0–0.1)
Immature Granulocytes: 1 %
Lymphocytes Absolute: 2.2 10*3/uL (ref 0.7–3.1)
Lymphs: 27 %
MCH: 29.1 pg (ref 26.6–33.0)
MCHC: 32.4 g/dL (ref 31.5–35.7)
MCV: 90 fL (ref 79–97)
Monocytes Absolute: 0.5 10*3/uL (ref 0.1–0.9)
Monocytes: 6 %
Neutrophils Absolute: 5.2 10*3/uL (ref 1.4–7.0)
Neutrophils: 62 %
Platelets: 396 10*3/uL (ref 150–450)
RBC: 5.22 x10E6/uL (ref 4.14–5.80)
RDW: 12.3 % (ref 11.6–15.4)
WBC: 8.2 10*3/uL (ref 3.4–10.8)

## 2021-10-25 LAB — COMPREHENSIVE METABOLIC PANEL
ALT: 21 IU/L (ref 0–44)
AST: 21 IU/L (ref 0–40)
Albumin/Globulin Ratio: 1.9 (ref 1.2–2.2)
Albumin: 4.6 g/dL (ref 3.9–4.9)
Alkaline Phosphatase: 73 IU/L (ref 44–121)
BUN/Creatinine Ratio: 14 (ref 10–24)
BUN: 15 mg/dL (ref 8–27)
Bilirubin Total: 0.9 mg/dL (ref 0.0–1.2)
CO2: 24 mmol/L (ref 20–29)
Calcium: 10 mg/dL (ref 8.6–10.2)
Chloride: 103 mmol/L (ref 96–106)
Creatinine, Ser: 1.1 mg/dL (ref 0.76–1.27)
Globulin, Total: 2.4 g/dL (ref 1.5–4.5)
Glucose: 108 mg/dL — ABNORMAL HIGH (ref 70–99)
Potassium: 4.7 mmol/L (ref 3.5–5.2)
Sodium: 140 mmol/L (ref 134–144)
Total Protein: 7 g/dL (ref 6.0–8.5)
eGFR: 73 mL/min/{1.73_m2} (ref >59)

## 2021-11-02 DIAGNOSIS — M1712 Unilateral primary osteoarthritis, left knee: Secondary | ICD-10-CM | POA: Diagnosis not present

## 2021-12-21 ENCOUNTER — Encounter: Payer: Self-pay | Admitting: Nurse Practitioner

## 2021-12-21 ENCOUNTER — Ambulatory Visit (INDEPENDENT_AMBULATORY_CARE_PROVIDER_SITE_OTHER): Payer: Medicare Other | Admitting: Nurse Practitioner

## 2021-12-21 VITALS — BP 131/82 | HR 72 | Ht 73.0 in | Wt 296.0 lb

## 2021-12-21 DIAGNOSIS — M79641 Pain in right hand: Secondary | ICD-10-CM | POA: Diagnosis not present

## 2021-12-21 MED ORDER — PREDNISONE 20 MG PO TABS: 20.0000 mg | ORAL_TABLET | Freq: Every day | ORAL | 0 refills | Status: DC

## 2021-12-21 MED ORDER — IBUPROFEN 600 MG PO TABS: 600.0000 mg | ORAL_TABLET | Freq: Four times a day (QID) | ORAL | 0 refills | Status: DC | PRN

## 2021-12-21 NOTE — Patient Instructions (Signed)
Paresthesia Paresthesia is an abnormal burning or prickling sensation. It is usually felt in the hands, arms, legs, or feet. However, it may occur in any part of the body. Usually, paresthesia is not painful. It may feel like: Tingling or numbness. Buzzing. Itching. Paresthesia may occur without any clear cause, or it may be caused by: Breathing too quickly (hyperventilation). Pressure on a nerve. An underlying medical condition. Side effects of a medicine. Nutritional deficiencies. Exposure to toxic chemicals. Most people experience temporary (transient) paresthesia at some time in their lives. For some people, it may be long-lasting (chronic) because of an underlying medical condition. If you have paresthesia that lasts a long time, you need to be evaluated by your health care provider. Follow these instructions at home: Nutrition Eat a healthy diet. This includes: Eating foods that are high in fiber, such as beans, whole grains, and fresh fruits and vegetables. Limiting foods that are high in fat and processed sugars, such as fried or sweet foods.  Alcohol use  Avoid or limit alcohol. Too much alcohol can cause a vitamin B deficiency, and vitamin B is needed for healthy nerves. Do not drink alcohol if: Your health care provider tells you not to drink. You are pregnant, may be pregnant, or are planning to become pregnant. If you drink alcohol: Limit how much you have to: 0-1 drink a day for women. 0-2 drinks a day for men. Know how much alcohol is in your drink. In the [place] one drink equals one 12 oz bottle of beer (355 mL), one 5 oz glass of wine (148 mL), or one 1 oz glass of hard liquor (44 mL). General instructions Take over-the-counter and prescription medicines only as told by your health care provider. Do not use any products that contain nicotine or tobacco. These products include cigarettes, chewing tobacco, and vaping devices, such as e-cigarettes. If you need help  quitting, ask your health care provider. If you have diabetes, work closely with your health care provider to keep your blood sugar under control. If you have numbness in your feet: Check every day for signs of injury or infection. Watch for redness, warmth, and swelling. Wear padded socks and comfortable shoes. These help protect your feet. Keep all follow-up visits. This is important. Contact a health care provider if you: Have paresthesia that gets worse or does not go away. Have numbness after an injury. Have a burning or prickling feeling that gets worse when you walk. Have pain, cramps, or dizziness, or you faint. Develop a rash. Get help right away if you: Feel muscle weakness. Develop new weakness in an arm or leg. Have trouble walking or moving. Have problems with speech, understanding, or vision. Feel confused. Cannot control your bladder or bowel movements. These symptoms may be an emergency. Get help right away. Call 911. Do not wait to see if the symptoms will go away. Do not drive yourself to the hospital. Summary Paresthesia is an abnormal burning or prickling sensation that is usually felt in the hands, arms, legs, or feet. It may also occur in other parts of the body. Paresthesia may occur without any clear cause, or it may be caused by breathing too quickly (hyperventilation), pressure on a nerve, an underlying medical condition, side effects of a medicine, nutritional deficiencies, or exposure to toxic chemicals. If you have paresthesia that lasts a long time, you need to be evaluated by your health care provider. This information is not intended to replace advice given to you by   your health care provider. Make sure you discuss any questions you have with your health care provider. Document Revised: 11/29/2020 Document Reviewed: 11/29/2020 Elsevier Patient Education  2023 Elsevier Inc.  

## 2021-12-21 NOTE — Progress Notes (Signed)
   Acute Office Visit  Subjective:     Patient ID: Arthur Foster, male    DOB: [birth year], 71 y.o.   MRN: [id number]  Chief Complaint  Patient presents with  . Arm Pain    Pain goes down arm to his fingers   . Head Injury    Hit head on his lawn mower about 2 weeks , states he did have some bleeding and a knot but states it was nothing major - since then he has noticed his right arm having tingling in it that will go down to his fingers and also some vision issues - all has started within the last 2 weeks. Pt did not want to get sent to any scans when brought uo , stated he would like to try a prednisone first and after a week if nothing has changed he would be okay with getting scans then    HPI Patient is in today for ***  Review of Systems  Constitutional: Negative.   HENT: Negative.    Eyes: Negative.   Respiratory: Negative.    Cardiovascular: Negative.   Gastrointestinal: Negative.   Genitourinary: Negative.   Musculoskeletal: Negative.   Skin: Negative.   Neurological:  Positive for tingling.  Psychiatric/Behavioral: Negative.    All other systems reviewed and are negative.       Objective:    BP 131/82   Pulse 72   Ht '6\' 1"'$  (1.854 m)   Wt 296 lb (134.3 kg)   SpO2 95%   BMI 39.05 kg/m  BP Readings from Last 3 Encounters:  12/21/21 131/82  10/24/21 133/86  04/25/21 119/69   Wt Readings from Last 3 Encounters:  12/21/21 296 lb (134.3 kg)  10/24/21 286 lb 6.4 oz (129.9 kg)  04/25/21 290 lb (131.5 kg)      Physical Exam Vitals and nursing note reviewed.  Constitutional:      Appearance: Normal appearance.  HENT:     Head: Normocephalic.     Right Ear: External ear normal.     Left Ear: External ear normal.     Nose: Nose normal.  Cardiovascular:     Rate and Rhythm: Normal rate and regular rhythm.     Pulses: Normal pulses.     Heart sounds: Normal heart sounds.  Pulmonary:     Effort: Pulmonary effort is normal.     Breath sounds: Normal breath  sounds.  Abdominal:     General: Bowel sounds are normal.  Musculoskeletal:        General: Normal range of motion.  Neurological:     General: No focal deficit present.     Mental Status: He is alert and oriented to person, place, and time.  Psychiatric:        Behavior: Behavior normal.    No results found for any visits on 12/21/21.      Assessment & Plan:   Problem List Items Addressed This Visit   None   No orders of the defined types were placed in this encounter.   No follow-ups on file.  Ivy Lynn, NP

## 2022-02-06 ENCOUNTER — Ambulatory Visit (INDEPENDENT_AMBULATORY_CARE_PROVIDER_SITE_OTHER): Payer: Medicare Other | Admitting: Nurse Practitioner

## 2022-02-06 ENCOUNTER — Encounter: Payer: Self-pay | Admitting: Nurse Practitioner

## 2022-02-06 VITALS — BP 143/89 | HR 65 | Temp 97.8°F | Resp 20 | Ht 73.0 in | Wt 298.0 lb

## 2022-02-06 DIAGNOSIS — R0981 Nasal congestion: Secondary | ICD-10-CM

## 2022-02-06 DIAGNOSIS — J40 Bronchitis, not specified as acute or chronic: Secondary | ICD-10-CM | POA: Diagnosis not present

## 2022-02-06 LAB — VERITOR FLU A/B WAIVED
Influenza A: NEGATIVE
Influenza B: NEGATIVE

## 2022-02-06 MED ORDER — AZITHROMYCIN 250 MG PO TABS: ORAL_TABLET | ORAL | 0 refills | Status: DC

## 2022-02-06 NOTE — Patient Instructions (Signed)
1. Take meds as prescribed 2. Use a cool mist humidifier especially during the winter months and when heat has been humid. 3. Use saline nose sprays frequently 4. Saline irrigations of the nose can be very helpful if done frequently.  * 4X daily for 1 week*  * Use of a nettie pot can be helpful with this. Follow directions with this* 5. Drink plenty of fluids 6. Keep thermostat turn down low 7.For any cough or congestion- delsym or mucinex 8. For fever or aces or pains- take tylenol or ibuprofen appropriate for age and weight.  * for fevers greater than 101 orally you may alternate ibuprofen and tylenol every  3 hours.

## 2022-02-06 NOTE — Progress Notes (Signed)
Subjective:    Patient ID: Arthur Foster, male    DOB: 01-Apr-1951, 71 y.o.   MRN: [id number]   Chief Complaint: Sore Throat, Nasal Congestion, Cough (Chest hurting with coughing/), and Fever   Sore Throat  Associated symptoms include congestion, coughing and headaches.  Cough Associated symptoms include a fever, headaches, rhinorrhea and a sore throat.  Fever  Associated symptoms include congestion, coughing, headaches and a sore throat.  URI  This is a new problem. The current episode started in the past 7 days. The problem has been gradually worsening. The maximum temperature recorded prior to his arrival was 100.4 - 100.9 F. The fever has been present for Less than 1 day. Associated symptoms include congestion, coughing, headaches, rhinorrhea, sneezing and a sore throat. He has tried antihistamine (dayquil and nitequil) for the symptoms. The treatment provided mild relief.       Review of Systems  Constitutional:  Positive for fever.  HENT:  Positive for congestion, rhinorrhea, sneezing and sore throat.   Respiratory:  Positive for cough.   Neurological:  Positive for headaches.       Objective:   Physical Exam Vitals reviewed.  Constitutional:      Appearance: Normal appearance. He is obese.  HENT:     Right Ear: Tympanic membrane normal.     Left Ear: Tympanic membrane normal.     Nose: Congestion and rhinorrhea present.  Eyes:     Pupils: Pupils are equal, round, and reactive to light.  Cardiovascular:     Rate and Rhythm: Normal rate and regular rhythm.     Heart sounds: Normal heart sounds.  Pulmonary:     Effort: Pulmonary effort is normal.     Breath sounds: Normal breath sounds.  Skin:    General: Skin is warm.  Neurological:     General: No focal deficit present.     Mental Status: He is alert and oriented to person, place, and time.  Psychiatric:        Mood and Affect: Mood normal.        Behavior: Behavior normal.       BP (!) 143/89   Pulse  65   Temp 97.8 F (36.6 C) (Temporal)   Resp 20   Ht '6\' 1"'$  (1.854 m)   Wt 298 lb (135.2 kg)   SpO2 95%   BMI 39.32 kg/m      Assessment & Plan:  Arthur Foster in today with chief complaint of Sore Throat, Nasal Congestion, Cough (Chest hurting with coughing/), and Fever   1. Nasal congestion - Veritor Flu A/B Waived - Novel Coronavirus, NAA ([facility])  2. Bronchitis 1. Take meds as prescribed 2. Use a cool mist humidifier especially during the winter months and when heat has been humid. 3. Use saline nose sprays frequently 4. Saline irrigations of the nose can be very helpful if done frequently.  * 4X daily for 1 week*  * Use of a nettie pot can be helpful with this. Follow directions with this* 5. Drink plenty of fluids 6. Keep thermostat turn down low 7.For any cough or congestion- OTC delsym or mucinex 8. For fever or aces or pains- take tylenol or ibuprofen appropriate for age and weight.  * for fevers greater than 101 orally you may alternate ibuprofen and tylenol every  3 hours.    - azithromycin (ZITHROMAX Z-PAK) 250 MG tablet; As directed  Dispense: 6 tablet; Refill: 0    The above assessment and management plan  was discussed with the patient. The patient verbalized understanding of and has agreed to the management plan. Patient is aware to call the clinic if symptoms persist or worsen. Patient is aware when to return to the clinic for a follow-up visit. Patient educated on when it is appropriate to go to the emergency department.   Mary-Margaret Hassell Done, FNP

## 2022-02-07 LAB — NOVEL CORONAVIRUS, NAA: SARS-CoV-2, NAA: DETECTED — AB

## 2022-02-20 DIAGNOSIS — Z1152 Encounter for screening for COVID-19: Secondary | ICD-10-CM | POA: Diagnosis not present

## 2022-02-20 DIAGNOSIS — Z20822 Contact with and (suspected) exposure to covid-19: Secondary | ICD-10-CM | POA: Diagnosis not present

## 2022-03-14 ENCOUNTER — Telehealth: Payer: Self-pay | Admitting: Nurse Practitioner

## 2022-03-14 DIAGNOSIS — R7309 Other abnormal glucose: Secondary | ICD-10-CM

## 2022-03-14 NOTE — Telephone Encounter (Signed)
Sure, add glucose and A1c. Fasting labs prefered

## 2022-03-15 NOTE — Telephone Encounter (Signed)
Lab orders placed.  Contacted patient. Patient will come first thing tomorrow and give fasting labs.

## 2022-03-16 ENCOUNTER — Other Ambulatory Visit: Payer: Medicare Other

## 2022-03-16 DIAGNOSIS — R7309 Other abnormal glucose: Secondary | ICD-10-CM

## 2022-03-16 LAB — GLUCOSE HEMOCUE WAIVED: Glu Hemocue Waived: 105 mg/dL — ABNORMAL HIGH (ref 70–99)

## 2022-03-16 LAB — BAYER DCA HB A1C WAIVED: HB A1C (BAYER DCA - WAIVED): 5.8 % — ABNORMAL HIGH (ref 4.8–5.6)

## 2022-03-17 DIAGNOSIS — Z1152 Encounter for screening for COVID-19: Secondary | ICD-10-CM | POA: Diagnosis not present

## 2022-03-17 DIAGNOSIS — Z20822 Contact with and (suspected) exposure to covid-19: Secondary | ICD-10-CM | POA: Diagnosis not present

## 2022-03-24 ENCOUNTER — Telehealth: Payer: Medicare Other | Admitting: Physician Assistant

## 2022-03-24 DIAGNOSIS — J208 Acute bronchitis due to other specified organisms: Secondary | ICD-10-CM

## 2022-03-24 DIAGNOSIS — B9689 Other specified bacterial agents as the cause of diseases classified elsewhere: Secondary | ICD-10-CM

## 2022-03-24 MED ORDER — DOXYCYCLINE HYCLATE 100 MG PO TABS: 100.0000 mg | ORAL_TABLET | Freq: Two times a day (BID) | ORAL | 0 refills | Status: DC

## 2022-03-24 MED ORDER — BENZONATATE 100 MG PO CAPS: 100.0000 mg | ORAL_CAPSULE | Freq: Three times a day (TID) | ORAL | 0 refills | Status: DC | PRN

## 2022-03-24 NOTE — Progress Notes (Signed)

## 2022-04-26 ENCOUNTER — Ambulatory Visit: Payer: Medicare Other | Admitting: Nurse Practitioner

## 2022-04-28 ENCOUNTER — Ambulatory Visit (INDEPENDENT_AMBULATORY_CARE_PROVIDER_SITE_OTHER): Payer: Medicare Other

## 2022-04-28 VITALS — Ht 73.0 in | Wt 275.0 lb

## 2022-04-28 DIAGNOSIS — Z0000 Encounter for general adult medical examination without abnormal findings: Secondary | ICD-10-CM

## 2022-04-28 NOTE — Progress Notes (Signed)
Subjective:   Arthur Foster is a 72 y.o. male who presents for Medicare Annual/Subsequent preventive examination. I connected with  Arthur Foster on 04/28/22 by a audio enabled telemedicine application and verified that I am speaking with the correct person using two identifiers.  Patient Location: Home  Provider Location: Home Office  I discussed the limitations of evaluation and management by telemedicine. The patient expressed understanding and agreed to proceed.  Review of Systems     Cardiac Risk Factors include: advanced age (>75mn, >>26women);hypertension;male gender     Objective:    Today's Vitals   04/28/22 0912  Weight: 275 lb (124.7 kg)  Height: '6\' 1"'$  (1.854 m)   Body mass index is 36.28 kg/m.     04/28/2022    9:24 AM 03/02/2021    9:20 AM 04/01/2019   10:21 AM 02/04/2019    9:00 AM  Advanced Directives  Does Patient Have a Medical Advance Directive? Yes Yes Yes Yes  Type of AParamedicof AGibbonLiving will HWadleyLiving will  HSuccessLiving will  Does patient want to make changes to medical advance directive?    No - Patient declined  Copy of HH[place]in Chart? No - copy requested No - copy requested  No - copy requested    Current Medications (verified) Outpatient Encounter Medications as of 04/28/2022  Medication Sig   ALPRAZolam (XANAX) 0.25 MG tablet Take 1 to 2 tablets as needed   escitalopram (LEXAPRO) 20 MG tablet Take 1 tablet (20 mg total) by mouth daily.   ibuprofen (ADVIL) 600 MG tablet Take 1 tablet (600 mg total) by mouth every 6 (six) hours as needed.   lisinopril (ZESTRIL) 20 MG tablet Take 1 tablet (20 mg total) by mouth daily.   Vitamin D, Ergocalciferol, (DRISDOL) 1.25 MG (50000 UNIT) CAPS capsule Take 1 capsule (50,000 Units total) by mouth once a week.   benzonatate (TESSALON) 100 MG capsule Take 1 capsule (100 mg total) by mouth 3 (three) times daily  as needed. (Patient not taking: Reported on 04/28/2022)   doxycycline (VIBRA-TABS) 100 MG tablet Take 1 tablet (100 mg total) by mouth 2 (two) times daily. (Patient not taking: Reported on 04/28/2022)   No facility-administered encounter medications on file as of 04/28/2022.    Allergies (verified) Bee venom, Other, Short ragweed pollen ext, and Ambien [zolpidem tartrate]   History: Past Medical History:  Diagnosis Date   Anxiety    BPH (benign prostatic hyperplasia)    Depression    Hyperlipidemia    Hypertension    Testosterone deficiency    Past Surgical History:  Procedure Laterality Date   FOOT SURGERY     HERNIA REPAIR Left inguinal   Family History  Problem Relation Age of Onset   Hypertension Mother    Alzheimer's disease Mother    Hypertension Father    Alzheimer's disease Father    Social History   Socioeconomic History   Marital status: Married    Spouse name: [name]   Number of children: 3   Years of education: 12   Highest education level: High school graduate  Occupational History   Occupation: retired    Comment: works a few hours a week  Tobacco Use   Smoking status: Former    Types: Cigarettes    Quit date: 1998    Years since quitting: 26.0   Smokeless tobacco: Former    Types: Chew    Quit date:  2002  Vaping Use   Vaping Use: Never used  Substance and Sexual Activity   Alcohol use: Yes    Alcohol/week: 14.0 standard drinks of alcohol    Types: 14 Cans of beer per week   Drug use: No   Sexual activity: Yes    Birth control/protection: None  Other Topics Concern   Not on file  Social History Narrative   3 daughters, 2 live in [place]. 6 grandchildren.   Was married x 33 years prior to first wife's death.   Remarried to [employer].   Social Determinants of Health   Financial Resource Strain: Low Risk  (04/28/2022)   Overall Financial Resource Strain (CARDIA)    Difficulty of Paying Living Expenses: Not hard at all  Food Insecurity: No  Food Insecurity (04/28/2022)   Hunger Vital Sign    Worried About Running Out of Food in the Last Year: Never true    Ran Out of Food in the Last Year: Never true  Transportation Needs: No Transportation Needs (04/28/2022)   PRAPARE - Hydrologist (Medical): No    Lack of Transportation (Non-Medical): No  Physical Activity: Sufficiently Active (04/28/2022)   Exercise Vital Sign    Days of Exercise per Week: 5 days    Minutes of Exercise per Session: 30 min  Stress: No Stress Concern Present (04/28/2022)   [place]    Feeling of Stress : Not at all  Social Connections: Moderately Isolated (04/28/2022)   Social Connection and Isolation Panel [NHANES]    Frequency of Communication with Friends and Family: More than three times a week    Frequency of Social Gatherings with Friends and Family: More than three times a week    Attends Religious Services: Never    Marine scientist or Organizations: No    Attends Music therapist: Never    Marital Status: Married    Tobacco Counseling Counseling given: Not Answered   Clinical Intake:  Pre-visit preparation completed: Yes  Pain : No/denies pain     Nutritional Risks: None Diabetes: No  How often do you need to have someone help you when you read instructions, pamphlets, or other written materials from your doctor or pharmacy?: 1 - Never  Diabetic?no   Interpreter Needed?: No  Information entered by :: Jadene Pierini, LPN   Activities of Daily Living    04/28/2022    9:25 AM  In your present state of health, do you have any difficulty performing the following activities:  Hearing? 0  Vision? 0  Difficulty concentrating or making decisions? 0  Walking or climbing stairs? 0  Dressing or bathing? 0  Doing errands, shopping? 0  Preparing Food and eating ? N  Using the Toilet? N  In the past six months, have you  accidently leaked urine? N  Do you have problems with loss of bowel control? N  Managing your Medications? N  Managing your Finances? N  Housekeeping or managing your Housekeeping? N    Patient Care Team: Baruch Gouty, FNP as PCP - General (Family Medicine) Ivy Lynn, NP as Nurse Practitioner (Nurse Practitioner)  Indicate any recent Medical Services you may have received from other than [facility] providers in the past year (date may be approximate).     Assessment:   This is a routine wellness examination for Dail.  Hearing/Vision screen Vision Screening - Comments:: Wears rx glasses - up to  date with routine eye exams with  Dr.Johnson   Dietary issues and exercise activities discussed: Current Exercise Habits: Home exercise routine, Type of exercise: walking, Time (Minutes): 30, Frequency (Times/Week): 5, Weekly Exercise (Minutes/Week): 150, Intensity: Mild, Exercise limited by: None identified   Goals Addressed             This Visit's Progress    DIET - INCREASE WATER INTAKE   On track    Try to drink 6-8 glasses of water daily        Depression Screen    04/28/2022    9:18 AM 02/06/2022    8:23 AM 10/24/2021    7:55 AM 04/25/2021    8:10 AM 03/02/2021    9:11 AM 01/11/2021    8:27 AM 10/08/2020   11:19 AM  PHQ 2/9 Scores  PHQ - 2 Score 0 0 0 0 0 0 0  PHQ- 9 Score  0  0   0    Fall Risk    04/28/2022    9:15 AM 02/06/2022    8:23 AM 04/25/2021    8:10 AM 03/02/2021    9:21 AM 01/11/2021    8:27 AM  Fall Risk   Falls in the past year? 0 0 0 0 0  Number falls in past yr: 0   0   Injury with Fall? 0   0   Risk for fall due to : No Fall Risks   No Fall Risks   Follow up Falls prevention discussed   Falls prevention discussed     FALL RISK PREVENTION PERTAINING TO THE HOME:  Any stairs in or around the home? Yes  If so, are there any without handrails? No  Home free of loose throw rugs in walkways, pet beds, electrical cords, etc? Yes  Adequate  lighting in your home to reduce risk of falls? Yes   ASSISTIVE DEVICES UTILIZED TO PREVENT FALLS:  Life alert? No  Use of a cane, walker or w/c? No  Grab bars in the bathroom? Yes  Shower chair or bench in shower? Yes  Elevated toilet seat or a handicapped toilet? Yes       12/19/2012    4:04 PM  MMSE - Mini Mental State Exam  Orientation to time 5  Orientation to Place 5  Registration 3  Attention/ Calculation 5  Recall 2  Language- name 2 objects 2  Language- repeat 1  Language- follow 3 step command 3  Language- read & follow direction 1  Write a sentence 1  Copy design 1  Total score 29        04/28/2022    9:25 AM 03/02/2021    9:23 AM 02/04/2019    9:07 AM  6CIT Screen  What Year? 0 points 0 points 0 points  What month? 0 points 0 points 0 points  What time? 0 points 0 points 0 points  Count back from 20 0 points 0 points 0 points  Months in reverse 0 points 0 points 0 points  Repeat phrase 0 points 0 points 0 points  Total Score 0 points 0 points 0 points    Immunizations Immunization History  Administered Date(s) Administered   Fluad Quad(high Dose 65+) 12/21/2018, 01/11/2021   Influenza Whole 03/03/2012   Influenza,inj,Quad PF,6+ Mos 01/08/2014, 03/06/2016   Influenza-Unspecified 01/11/2018, 12/13/2018   Pneumococcal Conjugate-13 05/26/2019   Pneumococcal Polysaccharide-23 06/24/2020   Tdap 12/03/2011   Zoster Recombinat (Shingrix) 12/23/2018, 02/24/2019    TDAP status: Due, Education  has been provided regarding the importance of this vaccine. Advised may receive this vaccine at local pharmacy or Health Dept. Aware to provide a copy of the vaccination record if obtained from local pharmacy or Health Dept. Verbalized acceptance and understanding.  Flu Vaccine status: Up to date  Pneumococcal vaccine status: Up to date  Covid-19 vaccine status: Completed vaccines  Qualifies for Shingles Vaccine? Yes   Zostavax completed Yes   Shingrix Completed?:  Yes  Screening Tests Health Maintenance  Topic Date Due   COVID-19 Vaccine (1) Never done   Hepatitis C Screening  Never done   DTaP/Tdap/Td (2 - Td or Tdap) 12/02/2021   INFLUENZA VACCINE  07/02/2022 (Originally 11/01/2021)   COLONOSCOPY (Pts 45-2yr Insurance coverage will need to be confirmed)  07/19/2022   Medicare Annual Wellness (AWV)  04/29/2023   Pneumonia Vaccine 70 Years old  Completed   Zoster Vaccines- Shingrix  Completed   HPV VACCINES  Aged Out    Health Maintenance  Health Maintenance Due  Topic Date Due   COVID-19 Vaccine (1) Never done   Hepatitis C Screening  Never done   DTaP/Tdap/Td (2 - Td or Tdap) 12/02/2021    Colorectal cancer screening: Type of screening: Colonoscopy. Completed 07/18/2012. Repeat every 10 years  Lung Cancer Screening: (Low Dose CT Chest recommended if Age 70-80years, 30 pack-year currently smoking OR have quit w/in 15years.) does not qualify.   Lung Cancer Screening Referral: n/a  Additional Screening:  Hepatitis C Screening: does not qualify;   Vision Screening: Recommended annual ophthalmology exams for early detection of glaucoma and other disorders of the eye. Is the patient up to date with their annual eye exam?  Yes  Who is the provider or what is the name of the office in which the patient attends annual eye exams? Dr.Johnson  If pt is not established with a provider, would they like to be referred to a provider to establish care? No .   Dental Screening: Recommended annual dental exams for proper oral hygiene  Community Resource Referral / Chronic Care Management: CRR required this visit?  No   CCM required this visit?  No      Plan:     I have personally reviewed and noted the following in the patient's chart:   Medical and social history Use of alcohol, tobacco or illicit drugs  Current medications and supplements including opioid prescriptions. Patient is not currently taking opioid  prescriptions. Functional ability and status Nutritional status Physical activity Advanced directives List of other physicians Hospitalizations, surgeries, and ER visits in previous 12 months Vitals Screenings to include cognitive, depression, and falls Referrals and appointments  In addition, I have reviewed and discussed with patient certain preventive protocols, quality metrics, and best practice recommendations. A written personalized care plan for preventive services as well as general preventive health recommendations were provided to patient.     LDaphane Shepherd LPN   [id number]  Nurse Notes: Due TDAP Vaccine

## 2022-04-28 NOTE — Patient Instructions (Signed)
[name] , Thank you for taking time to come for your Medicare Wellness Visit. I appreciate your ongoing commitment to your health goals. Please review the following plan we discussed and let me know if I can assist you in the future.   These are the goals we discussed:  Goals      DIET - INCREASE WATER INTAKE     Try to drink 6-8 glasses of water daily         This is a list of the screening recommended for you and due dates:  Health Maintenance  Topic Date Due   COVID-19 Vaccine (1) Never done   Hepatitis C Screening: USPSTF Recommendation to screen - Ages 34-79 yo.  Never done   DTaP/Tdap/Td vaccine (2 - Td or Tdap) 12/02/2021   Flu Shot  07/02/2022*   Colon Cancer Screening  07/19/2022   Medicare Annual Wellness Visit  04/29/2023   Pneumonia Vaccine  Completed   Zoster (Shingles) Vaccine  Completed   HPV Vaccine  Aged Out  *Topic was postponed. The date shown is not the original due date.    Advanced directives: Please bring a copy of your health care power of attorney and living will to the office to be added to your chart at your convenience.   Conditions/risks identified: Aim for 30 minutes of exercise or brisk walking, 6-8 glasses of water, and 5 servings of fruits and vegetables each day.   Next appointment: Follow up in one year for your annual wellness visit.   Preventive Care 9 Years and Older, Male  Preventive care refers to lifestyle choices and visits with your health care provider that can promote health and wellness. What does preventive care include? A yearly physical exam. This is also called an annual well check. Dental exams once or twice a year. Routine eye exams. Ask your health care provider how often you should have your eyes checked. Personal lifestyle choices, including: Daily care of your teeth and gums. Regular physical activity. Eating a healthy diet. Avoiding tobacco and drug use. Limiting alcohol use. Practicing safe sex. Taking low  doses of aspirin every day. Taking vitamin and mineral supplements as recommended by your health care provider. What happens during an annual well check? The services and screenings done by your health care provider during your annual well check will depend on your age, overall health, lifestyle risk factors, and family history of disease. Counseling  Your health care provider may ask you questions about your: Alcohol use. Tobacco use. Drug use. Emotional well-being. Home and relationship well-being. Sexual activity. Eating habits. History of falls. Memory and ability to understand (cognition). Work and work Statistician. Screening  You may have the following tests or measurements: Height, weight, and BMI. Blood pressure. Lipid and cholesterol levels. These may be checked every 5 years, or more frequently if you are over 69 years old. Skin check. Lung cancer screening. You may have this screening every year starting at age 44 if you have a 30-pack-year history of smoking and currently smoke or have quit within the past 15 years. Fecal occult blood test (FOBT) of the stool. You may have this test every year starting at age 67. Flexible sigmoidoscopy or colonoscopy. You may have a sigmoidoscopy every 5 years or a colonoscopy every 10 years starting at age 67. Prostate cancer screening. Recommendations will vary depending on your family history and other risks. Hepatitis C blood test. Hepatitis B blood test. Sexually transmitted disease (STD) testing. Diabetes screening. This  is done by checking your blood sugar (glucose) after you have not eaten for a while (fasting). You may have this done every 1-3 years. Abdominal aortic aneurysm (AAA) screening. You may need this if you are a current or former smoker. Osteoporosis. You may be screened starting at age 42 if you are at high risk. Talk with your health care provider about your test results, treatment options, and if necessary, the need  for more tests. Vaccines  Your health care provider may recommend certain vaccines, such as: Influenza vaccine. This is recommended every year. Tetanus, diphtheria, and acellular pertussis (Tdap, Td) vaccine. You may need a Td booster every 10 years. Zoster vaccine. You may need this after age 40. Pneumococcal 13-valent conjugate (PCV13) vaccine. One dose is recommended after age 18. Pneumococcal polysaccharide (PPSV23) vaccine. One dose is recommended after age 53. Talk to your health care provider about which screenings and vaccines you need and how often you need them. This information is not intended to replace advice given to you by your health care provider. Make sure you discuss any questions you have with your health care provider. Document Released: 04/16/2015 Document Revised: 12/08/2015 Document Reviewed: 01/19/2015 Elsevier Interactive Patient Education  2017 [place] Prevention in the Home Falls can cause injuries. They can happen to people of all ages. There are many things you can do to make your home safe and to help prevent falls. What can I do on the outside of my home? Regularly fix the edges of walkways and driveways and fix any cracks. Remove anything that might make you trip as you walk through a door, such as a raised step or threshold. Trim any bushes or trees on the path to your home. Use bright outdoor lighting. Clear any walking paths of anything that might make someone trip, such as rocks or tools. Regularly check to see if handrails are loose or broken. Make sure that both sides of any steps have handrails. Any raised decks and porches should have guardrails on the edges. Have any leaves, snow, or ice cleared regularly. Use sand or salt on walking paths during winter. Clean up any spills in your garage right away. This includes oil or grease spills. What can I do in the bathroom? Use night lights. Install grab bars by the toilet and in the tub and  shower. Do not use towel bars as grab bars. Use non-skid mats or decals in the tub or shower. If you need to sit down in the shower, use a plastic, non-slip stool. Keep the floor dry. Clean up any water that spills on the floor as soon as it happens. Remove soap buildup in the tub or shower regularly. Attach bath mats securely with double-sided non-slip rug tape. Do not have throw rugs and other things on the floor that can make you trip. What can I do in the bedroom? Use night lights. Make sure that you have a light by your bed that is easy to reach. Do not use any sheets or blankets that are too big for your bed. They should not hang down onto the floor. Have a firm chair that has side arms. You can use this for support while you get dressed. Do not have throw rugs and other things on the floor that can make you trip. What can I do in the kitchen? Clean up any spills right away. Avoid walking on wet floors. Keep items that you use a lot in easy-to-reach places. If you  need to reach something above you, use a strong step stool that has a grab bar. Keep electrical cords out of the way. Do not use floor polish or wax that makes floors slippery. If you must use wax, use non-skid floor wax. Do not have throw rugs and other things on the floor that can make you trip. What can I do with my stairs? Do not leave any items on the stairs. Make sure that there are handrails on both sides of the stairs and use them. Fix handrails that are broken or loose. Make sure that handrails are as long as the stairways. Check any carpeting to make sure that it is firmly attached to the stairs. Fix any carpet that is loose or worn. Avoid having throw rugs at the top or bottom of the stairs. If you do have throw rugs, attach them to the floor with carpet tape. Make sure that you have a light switch at the top of the stairs and the bottom of the stairs. If you do not have them, ask someone to add them for  you. What else can I do to help prevent falls? Wear shoes that: Do not have high heels. Have rubber bottoms. Are comfortable and fit you well. Are closed at the toe. Do not wear sandals. If you use a stepladder: Make sure that it is fully opened. Do not climb a closed stepladder. Make sure that both sides of the stepladder are locked into place. Ask someone to hold it for you, if possible. Clearly mark and make sure that you can see: Any grab bars or handrails. First and last steps. Where the edge of each step is. Use tools that help you move around (mobility aids) if they are needed. These include: Canes. Walkers. Scooters. Crutches. Turn on the lights when you go into a dark area. Replace any light bulbs as soon as they burn out. Set up your furniture so you have a clear path. Avoid moving your furniture around. If any of your floors are uneven, fix them. If there are any pets around you, be aware of where they are. Review your medicines with your doctor. Some medicines can make you feel dizzy. This can increase your chance of falling. Ask your doctor what other things that you can do to help prevent falls. This information is not intended to replace advice given to you by your health care provider. Make sure you discuss any questions you have with your health care provider. Document Released: 01/14/2009 Document Revised: 08/26/2015 Document Reviewed: 04/24/2014 Elsevier Interactive Patient Education  2017 [employer].

## 2022-05-11 ENCOUNTER — Encounter: Payer: Self-pay | Admitting: Family Medicine

## 2022-05-11 ENCOUNTER — Ambulatory Visit (INDEPENDENT_AMBULATORY_CARE_PROVIDER_SITE_OTHER): Payer: Medicare Other | Admitting: Family Medicine

## 2022-05-11 VITALS — BP 137/80 | HR 77 | Temp 97.8°F | Ht 73.0 in | Wt 306.4 lb

## 2022-05-11 DIAGNOSIS — E782 Mixed hyperlipidemia: Secondary | ICD-10-CM

## 2022-05-11 DIAGNOSIS — F339 Major depressive disorder, recurrent, unspecified: Secondary | ICD-10-CM

## 2022-05-11 DIAGNOSIS — F411 Generalized anxiety disorder: Secondary | ICD-10-CM | POA: Diagnosis not present

## 2022-05-11 DIAGNOSIS — I10 Essential (primary) hypertension: Secondary | ICD-10-CM | POA: Diagnosis not present

## 2022-05-11 DIAGNOSIS — R7303 Prediabetes: Secondary | ICD-10-CM

## 2022-05-11 DIAGNOSIS — N400 Enlarged prostate without lower urinary tract symptoms: Secondary | ICD-10-CM

## 2022-05-11 DIAGNOSIS — Z6841 Body Mass Index (BMI) 40.0 and over, adult: Secondary | ICD-10-CM | POA: Diagnosis not present

## 2022-05-11 DIAGNOSIS — H539 Unspecified visual disturbance: Secondary | ICD-10-CM | POA: Diagnosis not present

## 2022-05-11 DIAGNOSIS — Z79899 Other long term (current) drug therapy: Secondary | ICD-10-CM

## 2022-05-11 DIAGNOSIS — E559 Vitamin D deficiency, unspecified: Secondary | ICD-10-CM

## 2022-05-11 LAB — BAYER DCA HB A1C WAIVED: HB A1C (BAYER DCA - WAIVED): 6 % — ABNORMAL HIGH (ref 4.8–5.6)

## 2022-05-11 MED ORDER — ALPRAZOLAM 0.25 MG PO TABS: ORAL_TABLET | ORAL | 5 refills | Status: DC

## 2022-05-11 MED ORDER — LISINOPRIL 20 MG PO TABS: 20.0000 mg | ORAL_TABLET | Freq: Every day | ORAL | 1 refills | Status: DC

## 2022-05-11 MED ORDER — ESCITALOPRAM OXALATE 20 MG PO TABS: 20.0000 mg | ORAL_TABLET | Freq: Every day | ORAL | 1 refills | Status: DC

## 2022-05-11 NOTE — Patient Instructions (Addendum)
Need a good dilated eye exam    Goal BP:  For patients younger than 60: Goal BP < 140/90. For patients 60 and older: Goal BP < 150/90. For patients with diabetes: Goal BP < 140/90.  Take your medications faithfully as prescribed. Maintain a healthy weight. Get at least 150 minutes of aerobic exercise per week. Minimize salt intake, less than 2000 mg per day. Minimize alcohol intake.  DASH Eating Plan DASH stands for "Dietary Approaches to Stop Hypertension." The DASH eating plan is a healthy eating plan that has been shown to reduce high blood pressure (hypertension). Additional health benefits may include reducing the risk of type 2 diabetes mellitus, heart disease, and stroke. The DASH eating plan may also help with weight loss.  WHAT DO I NEED TO KNOW ABOUT THE DASH EATING PLAN? For the DASH eating plan, you will follow these general guidelines: Choose foods with a percent daily value for sodium of less than 5% (as listed on the food label). Use salt-free seasonings or herbs instead of table salt or sea salt. Check with your health care provider or pharmacist before using salt substitutes. Eat lower-sodium products, often labeled as "lower sodium" or "no salt added." Eat fresh foods. Eat more vegetables, fruits, and low-fat dairy products. Choose whole grains. Look for the word "whole" as the first word in the ingredient list. Choose fish and skinless chicken or [place] more often than red meat. Limit fish, poultry, and meat to 6 oz (170 g) each day. Limit sweets, desserts, sugars, and sugary drinks. Choose heart-healthy fats. Limit cheese to 1 oz (28 g) per day. Eat more home-cooked food and less restaurant, buffet, and fast food. Limit fried foods. Cook foods using methods other than frying. Limit canned vegetables. If you do use them, rinse them well to decrease the sodium. When eating at a restaurant, ask that your food be prepared with less salt, or no salt if  possible.  WHAT FOODS CAN I EAT? Seek help from a dietitian for individual calorie needs.  Grains Whole grain or whole wheat bread. Brown rice. Whole grain or whole wheat pasta. Quinoa, bulgur, and whole grain cereals. Low-sodium cereals. Corn or whole wheat flour tortillas. Whole grain cornbread. Whole grain crackers. Low-sodium crackers.  Vegetables Fresh or frozen vegetables (raw, steamed, roasted, or grilled). Low-sodium or reduced-sodium tomato and vegetable juices. Low-sodium or reduced-sodium tomato sauce and paste. Low-sodium or reduced-sodium canned vegetables.   Fruits All fresh, canned (in natural juice), or frozen fruits.  Meat and Other Protein Products Ground beef (85% or leaner), grass-fed beef, or beef trimmed of fat. Skinless chicken or [place]. Ground chicken or [place]. Pork trimmed of fat. All fish and seafood. Eggs. Dried beans, peas, or lentils. Unsalted nuts and seeds. Unsalted canned beans.  Dairy Low-fat dairy products, such as skim or 1% milk, 2% or reduced-fat cheeses, low-fat ricotta or cottage cheese, or plain low-fat yogurt. Low-sodium or reduced-sodium cheeses.  Fats and Oils Tub margarines without trans fats. Light or reduced-fat mayonnaise and salad dressings (reduced sodium). Avocado. Safflower, olive, or canola oils. Natural peanut or almond butter.  Other Unsalted popcorn and pretzels. The items listed above may not be a complete list of recommended foods or beverages. Contact your dietitian for more options.  WHAT FOODS ARE NOT RECOMMENDED?  Grains White bread. White pasta. White rice. Refined cornbread. Bagels and croissants. Crackers that contain trans fat.  Vegetables Creamed or fried vegetables. Vegetables in a cheese sauce. Regular canned vegetables. Regular canned tomato  sauce and paste. Regular tomato and vegetable juices.  Fruits Dried fruits. Canned fruit in light or heavy syrup. Fruit juice.  Meat and Other Protein Products Fatty  cuts of meat. Ribs, chicken wings, bacon, sausage, bologna, salami, chitterlings, fatback, hot dogs, bratwurst, and packaged luncheon meats. Salted nuts and seeds. Canned beans with salt.  Dairy Whole or 2% milk, cream, half-and-half, and cream cheese. Whole-fat or sweetened yogurt. Full-fat cheeses or blue cheese. Nondairy creamers and whipped toppings. Processed cheese, cheese spreads, or cheese curds.  Condiments Onion and garlic salt, seasoned salt, table salt, and sea salt. Canned and packaged gravies. Worcestershire sauce. Tartar sauce. Barbecue sauce. Teriyaki sauce. Soy sauce, including reduced sodium. Steak sauce. Fish sauce. Oyster sauce. Cocktail sauce. Horseradish. Ketchup and mustard. Meat flavorings and tenderizers. Bouillon cubes. Hot sauce. Tabasco sauce. Marinades. Taco seasonings. Relishes.  Fats and Oils Butter, stick margarine, lard, shortening, ghee, and bacon fat. Coconut, palm kernel, or palm oils. Regular salad dressings.  Other Pickles and olives. Salted popcorn and pretzels.  The items listed above may not be a complete list of foods and beverages to avoid. Contact your dietitian for more information.  WHERE CAN I FIND MORE INFORMATION? National Heart, Lung, and Blood Institute: travelstabloid.com Document Released: 03/09/2011 Document Revised: 08/04/2013 Document Reviewed: 01/22/2013 [facility] Patient Information 2015 [place], [place]. This information is not intended to replace advice given to you by your health care provider. Make sure you discuss any questions you have with your health care provider.   I think that you would greatly benefit from seeing a nutritionist.  If you are interested, please call Dr. Jenne Campus at [phone] to schedule an appointment.
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
## 2022-05-12 LAB — CBC WITH DIFFERENTIAL/PLATELET
Basophils Absolute: 0.1 10*3/uL (ref 0.0–0.2)
Basos: 1 %
EOS (ABSOLUTE): 0.3 10*3/uL (ref 0.0–0.4)
Eos: 4 %
Hematocrit: 45.4 % (ref 37.5–51.0)
Hemoglobin: 15.2 g/dL (ref 13.0–17.7)
Immature Grans (Abs): 0 10*3/uL (ref 0.0–0.1)
Immature Granulocytes: 1 %
Lymphocytes Absolute: 1.9 10*3/uL (ref 0.7–3.1)
Lymphs: 28 %
MCH: 29.9 pg (ref 26.6–33.0)
MCHC: 33.5 g/dL (ref 31.5–35.7)
MCV: 89 fL (ref 79–97)
Monocytes Absolute: 0.5 10*3/uL (ref 0.1–0.9)
Monocytes: 8 %
Neutrophils Absolute: 3.9 10*3/uL (ref 1.4–7.0)
Neutrophils: 58 %
Platelets: 354 10*3/uL (ref 150–450)
RBC: 5.08 x10E6/uL (ref 4.14–5.80)
RDW: 12.8 % (ref 11.6–15.4)
WBC: 6.6 10*3/uL (ref 3.4–10.8)

## 2022-05-12 LAB — CMP14+EGFR
ALT: 23 IU/L (ref 0–44)
AST: 20 IU/L (ref 0–40)
Albumin/Globulin Ratio: 1.9 (ref 1.2–2.2)
Albumin: 4.4 g/dL (ref 3.9–4.9)
Alkaline Phosphatase: 70 IU/L (ref 44–121)
BUN/Creatinine Ratio: 17 (ref 10–24)
BUN: 17 mg/dL (ref 8–27)
Bilirubin Total: 0.7 mg/dL (ref 0.0–1.2)
CO2: 20 mmol/L (ref 20–29)
Calcium: 9.3 mg/dL (ref 8.6–10.2)
Chloride: 105 mmol/L (ref 96–106)
Creatinine, Ser: 0.98 mg/dL (ref 0.76–1.27)
Globulin, Total: 2.3 g/dL (ref 1.5–4.5)
Glucose: 130 mg/dL — ABNORMAL HIGH (ref 70–99)
Potassium: 4.4 mmol/L (ref 3.5–5.2)
Sodium: 142 mmol/L (ref 134–144)
Total Protein: 6.7 g/dL (ref 6.0–8.5)
eGFR: 83 mL/min/{1.73_m2} (ref >59)

## 2022-05-12 LAB — THYROID PANEL WITH TSH
Free Thyroxine Index: 1.7 (ref 1.2–4.9)
T3 Uptake Ratio: 27 % (ref 24–39)
T4, Total: 6.3 ug/dL (ref 4.5–12.0)
TSH: 2.23 u[IU]/mL (ref 0.450–4.500)

## 2022-05-12 LAB — PSA, TOTAL AND FREE
PSA, Free Pct: 20 %
PSA, Free: 0.2 ng/mL
Prostate Specific Ag, Serum: 1 ng/mL (ref 0.0–4.0)

## 2022-05-12 LAB — LIPID PANEL
Chol/HDL Ratio: 4.4 ratio (ref 0.0–5.0)
Cholesterol, Total: 198 mg/dL (ref 100–199)
HDL: 45 mg/dL (ref >39)
LDL Chol Calc (NIH): 125 mg/dL — ABNORMAL HIGH (ref 0–99)
Triglycerides: 159 mg/dL — ABNORMAL HIGH (ref 0–149)
VLDL Cholesterol Cal: 28 mg/dL (ref 5–40)

## 2022-05-12 LAB — VITAMIN D 25 HYDROXY (VIT D DEFICIENCY, FRACTURES): Vit D, 25-Hydroxy: 38.7 ng/mL (ref 30.0–100.0)

## 2022-05-14 DIAGNOSIS — Z1152 Encounter for screening for COVID-19: Secondary | ICD-10-CM | POA: Diagnosis not present

## 2022-05-14 DIAGNOSIS — Z20822 Contact with and (suspected) exposure to covid-19: Secondary | ICD-10-CM | POA: Diagnosis not present

## 2022-05-14 LAB — DRUG SCREEN 10 W/CONF, SERUM
Amphetamines, IA: NEGATIVE ng/mL
Barbiturates, IA: NEGATIVE ug/mL
Benzodiazepines, IA: NEGATIVE ng/mL
Cocaine & Metabolite, IA: NEGATIVE ng/mL
Methadone, IA: NEGATIVE ng/mL
Opiates, IA: NEGATIVE ng/mL
Oxycodones, IA: NEGATIVE ng/mL
Phencyclidine, IA: NEGATIVE ng/mL
Propoxyphene, IA: NEGATIVE ng/mL
THC(Marijuana) Metabolite, IA: NEGATIVE ng/mL

## 2022-05-15 DIAGNOSIS — H2513 Age-related nuclear cataract, bilateral: Secondary | ICD-10-CM | POA: Diagnosis not present

## 2022-05-15 DIAGNOSIS — H43822 Vitreomacular adhesion, left eye: Secondary | ICD-10-CM | POA: Diagnosis not present

## 2022-05-18 DIAGNOSIS — H35362 Drusen (degenerative) of macula, left eye: Secondary | ICD-10-CM | POA: Diagnosis not present

## 2022-05-18 DIAGNOSIS — H43392 Other vitreous opacities, left eye: Secondary | ICD-10-CM | POA: Diagnosis not present

## 2022-05-18 DIAGNOSIS — H43813 Vitreous degeneration, bilateral: Secondary | ICD-10-CM | POA: Diagnosis not present

## 2022-05-18 DIAGNOSIS — H2513 Age-related nuclear cataract, bilateral: Secondary | ICD-10-CM | POA: Diagnosis not present

## 2022-06-12 DIAGNOSIS — Z20822 Contact with and (suspected) exposure to covid-19: Secondary | ICD-10-CM | POA: Diagnosis not present

## 2022-06-12 DIAGNOSIS — Z1152 Encounter for screening for COVID-19: Secondary | ICD-10-CM | POA: Diagnosis not present

## 2022-06-14 DIAGNOSIS — H2513 Age-related nuclear cataract, bilateral: Secondary | ICD-10-CM | POA: Diagnosis not present

## 2022-06-14 DIAGNOSIS — H43813 Vitreous degeneration, bilateral: Secondary | ICD-10-CM | POA: Diagnosis not present

## 2022-06-14 DIAGNOSIS — H52223 Regular astigmatism, bilateral: Secondary | ICD-10-CM | POA: Diagnosis not present

## 2022-06-14 DIAGNOSIS — H5213 Myopia, bilateral: Secondary | ICD-10-CM | POA: Diagnosis not present

## 2022-06-14 DIAGNOSIS — H35361 Drusen (degenerative) of macula, right eye: Secondary | ICD-10-CM | POA: Diagnosis not present

## 2022-06-14 DIAGNOSIS — H524 Presbyopia: Secondary | ICD-10-CM | POA: Diagnosis not present

## 2022-06-14 DIAGNOSIS — H0288A Meibomian gland dysfunction right eye, upper and lower eyelids: Secondary | ICD-10-CM | POA: Diagnosis not present

## 2022-06-29 ENCOUNTER — Ambulatory Visit (INDEPENDENT_AMBULATORY_CARE_PROVIDER_SITE_OTHER): Payer: Medicare Other | Admitting: Nurse Practitioner

## 2022-06-29 ENCOUNTER — Encounter: Payer: Self-pay | Admitting: Nurse Practitioner

## 2022-06-29 VITALS — BP 138/85 | HR 77 | Temp 97.4°F | Resp 20 | Ht 73.0 in | Wt 306.0 lb

## 2022-06-29 DIAGNOSIS — M79602 Pain in left arm: Secondary | ICD-10-CM

## 2022-06-29 MED ORDER — PREDNISONE 10 MG (21) PO TBPK: ORAL_TABLET | ORAL | 0 refills | Status: DC

## 2022-06-29 NOTE — Progress Notes (Signed)
Subjective:    Patient ID: Arthur Foster, male    DOB: [birth year], 72 y.o.   MRN: [id number]  Chief Complaint: Left arm pain   Arm Pain  The injury mechanism is unknown. The pain is present in the left shoulder and upper left arm. The quality of the pain is described as burning and shooting. The pain radiates to the left hand. The pain is at a severity of 8/10. The pain is moderate. The pain has been Intermittent since the incident. The symptoms are aggravated by movement. He has tried nothing for the symptoms. The treatment provided moderate relief.      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       Objective:   Physical Exam Constitutional:      Appearance: Normal appearance.  Cardiovascular:     Rate and Rhythm: Normal rate and regular rhythm.     Heart sounds: Normal heart sounds.  Pulmonary:     Effort: Pulmonary effort is normal.     Breath sounds: Normal breath sounds.  Musculoskeletal:     Comments: FROM of   left shoulder with pain on full extension Grips equal bil  Skin:    General: Skin is warm.  Neurological:     General: No focal deficit present.     Mental Status: He is alert and oriented to person, place, and time.  Psychiatric:        Mood and Affect: Mood normal.        Behavior: Behavior normal.     BP 138/85   Pulse 77   Temp (!) 97.4 F (36.3 C) (Oral)   Resp 20   Ht 6\' 1"  (1.854 m)   Wt (!) 306 lb (138.8 kg)   SpO2 97%   BMI 40.37 kg/m   EKG- Edyth Gunnels, FNP      Assessment & Plan:  [name] in today with chief complaint of Left arm pain   1. Left arm pain If meds do not help let me know - EKG 12-Lead - predniSONE (STERAPRED UNI-PAK 21 TAB) 10 MG (21) TBPK tablet; As directed x 6 days  Dispense: 21 tablet; Refill: 0    The above assessment and management plan was discussed with the patient. The patient verbalized understanding of and has agreed to the management plan. Patient is aware to call the clinic if symptoms persist or worsen. Patient is aware when to return to the clinic for a follow-up visit. Patient educated on when it is appropriate to go to the emergency department.   Mary-Margaret Hassell Done, FNP

## 2022-06-29 NOTE — Patient Instructions (Signed)
Radicular Pain Radicular pain is a type of pain that spreads from your back or neck along a spinal nerve. Spinal nerves are nerves that leave the spinal cord and go to the muscles. Radicular pain is sometimes called radiculopathy, radiculitis, or a pinched nerve. When you have this type of pain, you may also have weakness, numbness, or tingling in the area of your body that is supplied by the nerve. The pain may feel sharp and burning. Depending on which spinal nerve is affected, the pain may occur in the: Neck area (cervical radicular pain). You may also feel pain, numbness, weakness, or tingling in the arms. Mid-spine area (thoracic radicular pain). You would feel this pain in the back and chest. This type is rare. Lower back area (lumbar radicular pain). You would feel this pain as low back pain. You may feel pain, numbness, weakness, or tingling in the buttocks or legs. Sciatica is a type of lumbar radicular pain that shoots down the back of the leg. Radicular pain occurs when one of the spinal nerves becomes irritated or squeezed (compressed). It is often caused by something pushing on a spinal nerve, such as one of the bones of the spine (vertebrae) or one of the round cushions between vertebrae (intervertebral disks). This can result from: An injury. Wear and tear or aging of a disk. The growth of a bone spur that pushes on the nerve. Radicular pain often goes away when you follow instructions from your health care provider for relieving pain at home. How is this treated? Treatment may depend on the cause of the condition and may include: Working with a physical therapist. Taking pain medicine. Applying heat or ice or both to the affected areas. Doing stretches to improve flexibility. Having surgery. This may be needed if other treatments do not help. Different types of surgery may be done depending on the cause of this condition. Follow these instructions at home: Managing pain     If  directed, put ice on the affected area. To do this: Put ice in a plastic bag. Place a towel between your skin and the bag. Leave the ice on for 20 minutes, 2-3 times a day. Remove the ice if your skin turns bright red. This is very important. If you cannot feel pain, heat, or cold, you have a greater risk of damage to the area. If directed, apply heat to the affected area as often as told by your health care provider. Use the heat source that your health care provider recommends, such as a moist heat pack or a heating pad. Place a towel between your skin and the heat source. Leave the heat on for 20-30 minutes. Remove the heat if your skin turns bright red. This is especially important if you are unable to feel pain, heat, or cold. You have a greater risk of getting burned. Activity Do not sit or rest in bed for long periods of time. Try to stay as active as possible. Ask your health care provider what type of exercise or activity is best for you. Avoid activities that make your pain worse, such as bending and lifting. You may have to avoid lifting. Ask your health care provider how much you can safely lift. Practice using proper technique when lifting items. Proper lifting technique involves bending your knees and rising up. Do strength and range-of-motion exercises only as told by your health care provider or physical therapist. General instructions Take over-the-counter and prescription medicines only as told by your   health care provider. Pay attention to any changes in your symptoms. Keep all follow-up visits. This is important. Contact a health care provider if: Your pain and other symptoms get worse. Your pain medicine is not helping. Your pain has not improved after a few weeks of home care. You have a fever. Get help right away if: You have severe pain, weakness, or numbness. You have difficulty with bladder or bowel control. Summary Radicular pain is a type of pain that spreads  from your back or neck along a spinal nerve. When you have radicular pain, you may also have weakness, numbness, or tingling in the area of your body that is supplied by the nerve. The pain may feel sharp or burning. Radicular pain may be treated with ice, heat, medicines, or physical therapy. This information is not intended to replace advice given to you by your health care provider. Make sure you discuss any questions you have with your health care provider. Document Revised: 09/23/2020 Document Reviewed: 09/23/2020 Elsevier Patient Education  2023 [employer] Inc.  

## 2022-07-10 DIAGNOSIS — H2513 Age-related nuclear cataract, bilateral: Secondary | ICD-10-CM | POA: Diagnosis not present

## 2022-07-10 DIAGNOSIS — H2512 Age-related nuclear cataract, left eye: Secondary | ICD-10-CM | POA: Diagnosis not present

## 2022-07-13 DIAGNOSIS — Z20822 Contact with and (suspected) exposure to covid-19: Secondary | ICD-10-CM | POA: Diagnosis not present

## 2022-07-13 DIAGNOSIS — Z1152 Encounter for screening for COVID-19: Secondary | ICD-10-CM | POA: Diagnosis not present

## 2022-08-08 DIAGNOSIS — H2512 Age-related nuclear cataract, left eye: Secondary | ICD-10-CM | POA: Diagnosis not present

## 2022-08-08 DIAGNOSIS — H269 Unspecified cataract: Secondary | ICD-10-CM | POA: Diagnosis not present

## 2022-09-01 DIAGNOSIS — Z1152 Encounter for screening for COVID-19: Secondary | ICD-10-CM | POA: Diagnosis not present

## 2022-09-01 DIAGNOSIS — Z20822 Contact with and (suspected) exposure to covid-19: Secondary | ICD-10-CM | POA: Diagnosis not present

## 2022-09-05 ENCOUNTER — Other Ambulatory Visit: Payer: Self-pay

## 2022-09-05 ENCOUNTER — Other Ambulatory Visit (INDEPENDENT_AMBULATORY_CARE_PROVIDER_SITE_OTHER): Payer: Medicare Other

## 2022-09-05 ENCOUNTER — Encounter: Payer: Self-pay | Admitting: Family Medicine

## 2022-09-05 ENCOUNTER — Ambulatory Visit (INDEPENDENT_AMBULATORY_CARE_PROVIDER_SITE_OTHER): Payer: Medicare Other | Admitting: Family Medicine

## 2022-09-05 VITALS — BP 119/74 | HR 74 | Temp 98.2°F | Ht 73.0 in | Wt 309.0 lb

## 2022-09-05 DIAGNOSIS — R202 Paresthesia of skin: Secondary | ICD-10-CM

## 2022-09-05 DIAGNOSIS — M79602 Pain in left arm: Secondary | ICD-10-CM

## 2022-09-05 DIAGNOSIS — M542 Cervicalgia: Secondary | ICD-10-CM | POA: Diagnosis not present

## 2022-09-05 DIAGNOSIS — H539 Unspecified visual disturbance: Secondary | ICD-10-CM

## 2022-09-05 DIAGNOSIS — R200 Anesthesia of skin: Secondary | ICD-10-CM | POA: Diagnosis not present

## 2022-09-05 MED ORDER — PREDNISONE 20 MG PO TABS: 40.0000 mg | ORAL_TABLET | Freq: Every day | ORAL | 0 refills | Status: AC

## 2022-09-05 NOTE — Progress Notes (Signed)
Acute Office Visit  Subjective:  Patient ID: Arthur Foster, male    DOB: Jul 27, 1950, 72 y.o.   MRN: [id number]  Chief Complaint  Patient presents with   bilateral arm, tingling, numbness, shooting pain   HPI Patient is in today for follow up of pain in arms. Endorses numbness and tingling in left arm and hands. In October 2023, he hit is head on machine that he was working on. He started having "pins and needles" in his left arm daily after that. He states that it mainly happens with lifting of his left arm. Increasing pain with sleeping due to his position. He was seen by Marcelino Duster, who recommended imaging due to changes in his vision. He wanted to wait. Started Scientist, forensic. Trialed prednisone and it resolved for a while. 2 weeks ago started in both arms. He reports that he is 98% retired. States that his blurry vision was caused by cataracts. He states that he followed up with optometrist. States that he used icy hot max on his arm and it worked really well.   ROS As per HPI Objective:  BP 119/74   Pulse 74   Temp 98.2 F (36.8 C)   Ht 6\' 1"  (1.854 m)   Wt (!) 309 lb (140.2 kg)   SpO2 96%   BMI 40.77 kg/m   Physical Exam Constitutional:      General: He is awake. He is not in acute distress.    Appearance: Normal appearance. He is well-developed and well-groomed. He is not ill-appearing, toxic-appearing or diaphoretic.  Cardiovascular:     Rate and Rhythm: Normal rate.     Pulses: Normal pulses.          Radial pulses are 2+ on the right side and 2+ on the left side.       Posterior tibial pulses are 2+ on the right side and 2+ on the left side.     Heart sounds: Normal heart sounds. No murmur heard.    No gallop.  Pulmonary:     Effort: Pulmonary effort is normal. No respiratory distress.     Breath sounds: Normal breath sounds. No stridor. No wheezing, rhonchi or rales.  Musculoskeletal:     Right shoulder: No swelling, deformity, effusion, laceration or tenderness.  Normal range of motion.     Left shoulder: Tenderness and crepitus present. No swelling or laceration. Decreased range of motion. Normal pulse.     Cervical back: Full passive range of motion without pain and neck supple.     Right lower leg: No edema.     Left lower leg: No edema.     Comments: Positive empty can test   Skin:    General: Skin is warm.     Capillary Refill: Capillary refill takes less than 2 seconds.  Neurological:     General: No focal deficit present.     Mental Status: He is alert, oriented to person, place, and time and easily aroused. Mental status is at baseline.     GCS: GCS eye subscore is 4. GCS verbal subscore is 5. GCS motor subscore is 6.     Sensory: Sensation is intact.     Motor: No weakness, tremor or abnormal muscle tone.     Coordination: Coordination is intact. Finger-Nose-Finger Test normal. Rapid alternating movements normal.  Psychiatric:        Attention and Perception: Attention and perception normal.        Mood and Affect: Mood and affect normal.  Speech: Speech normal.        Behavior: Behavior normal. Behavior is cooperative.        Thought Content: Thought content normal. Thought content does not include homicidal or suicidal ideation. Thought content does not include homicidal or suicidal plan.        Cognition and Memory: Cognition and memory normal.        Judgment: Judgment normal.    Assessment & Plan:  1. Numbness and tingling in left arm Will collect imaging as below. Patient has completed conservative treatment twice with prednisone and chiropractic care, but has not yet completed physical therapy. Discussed with patient that we will wait for results of imaging to determine next steps. Will consider referral to ortho to evaluate if origin of pain is cervical spine or shoulder. Reviewed note by Daphine Deutscher 06/29/22, completed EKG which had no acute abnormalities, continued with conservative treatment of pain.  - DG Cervical Spine  Complete; Future - Ambulatory referral to Physical Therapy  2. Left arm pain See above plan.   3. Visual disturbance Reviewed note by Rakes, FNP 05/11/22, who recommended imaging due to changes in vision. Resolved per patient with cataract intervention.    The above assessment and management plan was discussed with the patient. The patient verbalized understanding of and has agreed to the management plan using shared-decision making. Patient is aware to call the clinic if they develop any new symptoms or if symptoms fail to improve or worsen. Patient is aware when to return to the clinic for a follow-up visit. Patient educated on when it is appropriate to go to the emergency department.   Return if symptoms worsen or fail to improve.  Neale Burly, DNP-FNP [facility] [address] [place], [place] [address] [phone]

## 2022-09-06 NOTE — Progress Notes (Signed)
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 Can continue with current plan with PT, if patient would like referral to ortho, we can place that as well.

## 2022-09-14 ENCOUNTER — Ambulatory Visit: Payer: Medicare Other | Attending: Family Medicine | Admitting: Physical Therapy

## 2022-09-14 ENCOUNTER — Encounter: Payer: Self-pay | Admitting: Physical Therapy

## 2022-09-14 ENCOUNTER — Other Ambulatory Visit: Payer: Self-pay

## 2022-09-14 DIAGNOSIS — R200 Anesthesia of skin: Secondary | ICD-10-CM | POA: Insufficient documentation

## 2022-09-14 DIAGNOSIS — M542 Cervicalgia: Secondary | ICD-10-CM | POA: Insufficient documentation

## 2022-09-14 DIAGNOSIS — M62838 Other muscle spasm: Secondary | ICD-10-CM | POA: Insufficient documentation

## 2022-09-14 DIAGNOSIS — R202 Paresthesia of skin: Secondary | ICD-10-CM | POA: Diagnosis not present

## 2022-09-14 NOTE — Therapy (Signed)
OUTPATIENT PHYSICAL THERAPY CERVICAL EVALUATION   Patient Name: Arthur Foster MRN: [id number] DOB:September 24, 1950, 72 y.o., male Today's Date: 09/14/2022  END OF SESSION:  PT End of Session - 09/14/22 1032     Visit Number 1    Number of Visits 12    Date for PT Re-Evaluation 10/26/22    Authorization Type FOTO    PT Start Time 0853    PT Stop Time 0943    PT Time Calculation (min) 50 min    Activity Tolerance Patient tolerated treatment well    Behavior During Therapy [facility] for tasks assessed/performed             Past Medical History:  Diagnosis Date   Anxiety    BPH (benign prostatic hyperplasia)    Depression    Hyperlipidemia    Hypertension    Testosterone deficiency    Past Surgical History:  Procedure Laterality Date   FOOT SURGERY     HERNIA REPAIR Left inguinal   Patient Active Problem List   Diagnosis Date Noted   Prediabetes 05/11/2022   Preop examination 01/11/2021   Back pain 06/24/2020   Dysuria 06/24/2020   Tendonitis, Achilles, right 05/27/2019   Controlled substance agreement signed 11/22/2018   Blood glucose elevated 11/22/2018   Vitamin D deficiency 11/22/2018   Sensorineural hearing loss (SNHL), bilateral 06/17/2018   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  THERAPY DIAG:  Cervicalgia  Other muscle spasm  Rationale for Evaluation and Treatment:  Rehabilitation  ONSET DATE: November 2023.  SUBJECTIVE:                                                                                                                                                                                                         SUBJECTIVE STATEMENT: The patient presents to the clinic with c/o left-sided neck pain and occasional radiation of tingling into his left fingertips (sometimes on the right).  He remembers hitting is head very hard on the deck of his Zero turn mower resulting in an "  egg" on top of his head.  He has had two regimens of Prednisone which has been helpful while he is on it.  He tried some Chiropractic care but felt this may have made him a bit worse.  His pain at rest today is ~ a 3-4/10 but can rise to a 7+/10 with increased use of his left shoulder.  PERTINENT HISTORY:  HTN.  PAIN:  Are you having pain? Yes: NPRS scale: 3-4/10 Pain location: Left cervical. Pain description: Numbness, pins and needles. Aggravating factors: Increased use of left UE. Relieving factors: Rest.  PRECAUTIONS: None  WEIGHT BEARING RESTRICTIONS: No  FALLS:  Has patient fallen in last 6 months? No  LIVING ENVIRONMENT: Lives with: lives with their spouse Lives in: House/apartment Has following equipment at home: None  OCCUPATION: Personnel officer   PLOF: Independent  PATIENT GOALS: Not have pain and symptoms into hand.    OBJECTIVE:   DIAGNOSTIC FINDINGS:  09/05/22:  Multilevel degenerative disc disease most prominent at C4-C5 and C5-C6.  PATIENT SURVEYS:  FOTO.  POSTURE: rounded shoulders and forward head, loss of lordosis.  PALPATION: Tender with active trigger point over left UT.   CERVICAL ROM:  Bilateral active cervical rotation is 60 degrees.  Extension is 40 degrees and bilateral side bending is 15 degrees.  UPPER EXTREMITY ROM:  WNL.  UPPER EXTREMITY MMT:  Normal UE strength.  CERVICAL SPECIAL TESTS:  Normal UE DTR's.   Some pain  reproduction with left shoulder impingement testing.   TODAY'S TREATMENT:                                                                                                                              DATE: HMP and IFC at 80-150 Hz on 40% scan x 17 minutes to patient's left UT region.  Normal modality response following removal of modality.     PATIENT EDUCATION:  Education details: Chin tucks and extension and also facilitated with a towel. Person educated: Patient Education method: Medical illustrator Education comprehension: verbalized understanding and returned demonstration  HOME EXERCISE PROGRAM: As above.  ASSESSMENT:  CLINICAL IMPRESSION: The patient presents to OPPT with c/o left sided neck pain with radiation of symptoms (numbness, pins and needles) into his left fingertips.  He has a loss of active cervical range of motion.  His UE DTR's are normal as is his UE strength. He was tender to palpation with a notable active trigger point over his left UT.  He had some pain reproduction with left shoulder impingement testing.  Patient will benefit from skilled physical therapy intervention to address pain and deficits.  OBJECTIVE IMPAIRMENTS: decreased activity tolerance, decreased ROM, increased muscle spasms, postural dysfunction, and pain.   ACTIVITY LIMITATIONS: carrying, lifting, and reach over head  PARTICIPATION LIMITATIONS: occupation  PERSONAL FACTORS: Time since onset of injury/illness/exacerbation are also affecting patient's functional outcome.   REHAB POTENTIAL: Excellent  CLINICAL DECISION MAKING: Stable/uncomplicated  EVALUATION COMPLEXITY: Low   GOALS:  SHORT TERM GOALS: Target date: 09/28/22.  Ind with a HEP.  Goal status: INITIAL   LONG TERM GOALS: Target date: 10/26/22  Increase active cervical rotation to 70 degrees+ so patient can turn head more easily while driving.  Goal status: INITIAL  2.  Eliminate UE symptoms.  Goal status:  INITIAL  3.  Perform ADL's with pain not > 2-3/10.  Goal status: INITIAL   PLAN:  PT FREQUENCY: 2x/week  PT DURATION: 6 weeks  PLANNED INTERVENTIONS: Therapeutic exercises, Therapeutic activity, Patient/Family education, Self Care, Dry Needling, Electrical stimulation, Cryotherapy, Moist heat, Traction, Ultrasound, and Manual therapy  PLAN FOR NEXT SESSION: Review HEP, combo e'stim/US, dry needling, STW/M, may consider intermittent traction beginning at 15#.   Kaileia Flow, [name], PT 09/14/2022, 11:03 AM

## 2022-09-19 ENCOUNTER — Ambulatory Visit: Payer: Medicare Other | Admitting: Physical Therapy

## 2022-09-19 DIAGNOSIS — R202 Paresthesia of skin: Secondary | ICD-10-CM | POA: Diagnosis not present

## 2022-09-19 DIAGNOSIS — M542 Cervicalgia: Secondary | ICD-10-CM | POA: Diagnosis not present

## 2022-09-19 DIAGNOSIS — M62838 Other muscle spasm: Secondary | ICD-10-CM | POA: Diagnosis not present

## 2022-09-19 DIAGNOSIS — R200 Anesthesia of skin: Secondary | ICD-10-CM | POA: Diagnosis not present

## 2022-09-19 NOTE — Therapy (Signed)
OUTPATIENT PHYSICAL THERAPY CERVICAL EVALUATION   Patient Name: Arthur Foster MRN: [id number] DOB:04-03-1951, 72 y.o., male Today's Date: 09/19/2022  END OF SESSION:  PT End of Session - 09/19/22 0931     Visit Number 2    Number of Visits 12    Date for PT Re-Evaluation 10/26/22    Authorization Type FOTO    PT Start Time 0845    PT Stop Time 0933    PT Time Calculation (min) 48 min    Activity Tolerance Patient tolerated treatment well    Behavior During Therapy WFL for tasks assessed/performed             Past Medical History:  Diagnosis Date   Anxiety    BPH (benign prostatic hyperplasia)    Depression    Hyperlipidemia    Hypertension    Testosterone deficiency    Past Surgical History:  Procedure Laterality Date   FOOT SURGERY     HERNIA REPAIR Left inguinal   Patient Active Problem List   Diagnosis Date Noted   Prediabetes 05/11/2022   Preop examination 01/11/2021   Back pain 06/24/2020   Dysuria 06/24/2020   Tendonitis, Achilles, right 05/27/2019   Controlled substance agreement signed 11/22/2018   Blood glucose elevated 11/22/2018   Vitamin D deficiency 11/22/2018   Sensorineural hearing loss (SNHL), bilateral 06/17/2018   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  THERAPY DIAG:  Cervicalgia  Other muscle spasm  Rationale for Evaluation and Treatment:  Rehabilitation  ONSET DATE: November 2023.  SUBJECTIVE:                                                                                                                                                                                                         SUBJECTIVE STATEMENT: No new complaints.  PERTINENT HISTORY:  HTN.  PAIN:  Are you having pain? Yes: NPRS scale: 3-4/10 Pain location: Left cervical. Pain description: Numbness, pins and needles. Aggravating factors: Increased use of left UE. Relieving factors: Rest.  PRECAUTIONS: None  WEIGHT BEARING RESTRICTIONS: No  FALLS:  Has patient fallen in last 6 months? No  LIVING ENVIRONMENT: Lives with: lives with their spouse Lives in: House/apartment Has following equipment at home: None  OCCUPATION: Personnel officer   PLOF: Independent  PATIENT GOALS: Not have pain and symptoms into hand.    OBJECTIVE:   DIAGNOSTIC FINDINGS:  09/05/22:  Multilevel degenerative disc disease most prominent at C4-C5 and C5-C6.  PATIENT SURVEYS:  FOTO.  POSTURE: rounded shoulders and forward head, loss of lordosis.  PALPATION: Tender with active trigger point over left UT.   CERVICAL ROM:  Bilateral active cervical rotation is 60 degrees.  Extension is 40 degrees and bilateral side bending is 15 degrees.  UPPER EXTREMITY ROM:  WNL.  UPPER EXTREMITY MMT:  Normal UE strength.  CERVICAL SPECIAL TESTS:  Normal UE DTR's.   Some pain reproduction with left shoulder impingement testing.   TODAY'S TREATMENT:                                                                                                                              DATE: Combo e'stim/US at 1.50 w/CM2 x 12 minutes f/b STW/M x 11 minutes with ischemic release technique to patient's left UT f/b HMP and IFC at 80-150 Hz on 40% scan x 15 minutes to patient's left UT region.  Normal modality response following removal of modality.     PATIENT EDUCATION:  Education  details: Chin tucks and extension and also facilitated with a towel. Person educated: Patient Education method: Medical illustrator Education comprehension: verbalized understanding and returned demonstration  HOME EXERCISE PROGRAM: As above.  ASSESSMENT:  CLINICAL IMPRESSION: Patient did well with treatment today.  He has increased tone over his left UT.  Good response to treatment today including STW/M utilizing ischemic release technique.    OBJECTIVE IMPAIRMENTS: decreased activity tolerance, decreased ROM, increased muscle spasms, postural dysfunction, and pain.   ACTIVITY LIMITATIONS: carrying, lifting, and reach over head  PARTICIPATION LIMITATIONS: occupation  PERSONAL FACTORS: Time since onset of injury/illness/exacerbation are also affecting patient's functional outcome.   REHAB POTENTIAL: Excellent  CLINICAL DECISION MAKING: Stable/uncomplicated  EVALUATION COMPLEXITY: Low   GOALS:  SHORT TERM GOALS: Target date: 09/28/22.  Ind with a HEP.  Goal status: INITIAL   LONG TERM GOALS: Target date: 10/26/22  Increase active cervical rotation to 70 degrees+ so patient can turn head more easily while driving.  Goal status: INITIAL  2.  Eliminate UE symptoms.  Goal status: INITIAL  3.  Perform ADL's with pain not > 2-3/10.  Goal status: INITIAL   PLAN:  PT FREQUENCY: 2x/week  PT DURATION: 6 weeks  PLANNED INTERVENTIONS: Therapeutic exercises, Therapeutic activity, Patient/Family education, Self Care, Dry Needling, Electrical stimulation, Cryotherapy, Moist heat, Traction, Ultrasound, and Manual therapy  PLAN FOR NEXT SESSION: Review HEP, combo e'stim/US, dry needling, STW/M, may consider intermittent traction beginning at 15#.   Justise Ehmann, [name], PT 09/19/2022, 9:36 AM

## 2022-09-22 ENCOUNTER — Ambulatory Visit: Payer: Medicare Other | Admitting: Physical Therapy

## 2022-09-22 DIAGNOSIS — M62838 Other muscle spasm: Secondary | ICD-10-CM | POA: Diagnosis not present

## 2022-09-22 DIAGNOSIS — M542 Cervicalgia: Secondary | ICD-10-CM | POA: Diagnosis not present

## 2022-09-22 DIAGNOSIS — R200 Anesthesia of skin: Secondary | ICD-10-CM | POA: Diagnosis not present

## 2022-09-22 DIAGNOSIS — R202 Paresthesia of skin: Secondary | ICD-10-CM | POA: Diagnosis not present

## 2022-09-22 NOTE — Therapy (Signed)
OUTPATIENT PHYSICAL THERAPY CERVICAL EVALUATION   Patient Name: Arthur Foster MRN: [id number] DOB:28-Nov-1950, 72 y.o., male Today's Date: 09/22/2022  END OF SESSION:  PT End of Session - 09/22/22 0935     Visit Number 3    Number of Visits 12    Date for PT Re-Evaluation 10/26/22    Authorization Type FOTO    PT Start Time 0845    PT Stop Time 0930    PT Time Calculation (min) 45 min    Activity Tolerance Patient tolerated treatment well    Behavior During Therapy WFL for tasks assessed/performed             Past Medical History:  Diagnosis Date   Anxiety    BPH (benign prostatic hyperplasia)    Depression    Hyperlipidemia    Hypertension    Testosterone deficiency    Past Surgical History:  Procedure Laterality Date   FOOT SURGERY     HERNIA REPAIR Left inguinal   Patient Active Problem List   Diagnosis Date Noted   Prediabetes 05/11/2022   Preop examination 01/11/2021   Back pain 06/24/2020   Dysuria 06/24/2020   Tendonitis, Achilles, right 05/27/2019   Controlled substance agreement signed 11/22/2018   Blood glucose elevated 11/22/2018   Vitamin D deficiency 11/22/2018   Sensorineural hearing loss (SNHL), bilateral 06/17/2018   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  THERAPY DIAG:  Cervicalgia  Other muscle spasm  Rationale for Evaluation and Treatment:  Rehabilitation  ONSET DATE: November 2023.  SUBJECTIVE:                                                                                                                                                                                                         SUBJECTIVE STATEMENT: Doing okay.  PERTINENT HISTORY:  HTN.  PAIN:  Are you having pain? Yes: NPRS scale: 3-4/10 Pain location: Left cervical. Pain description: Numbness, pins and needles. Aggravating factors: Increased use of left UE. Relieving factors: Rest.  PRECAUTIONS: None  WEIGHT  BEARING RESTRICTIONS: No  FALLS:  Has patient fallen in last 6 months? No  LIVING ENVIRONMENT: Lives with: lives with their spouse Lives in: House/apartment Has following equipment at home: None  OCCUPATION: Personnel officer   PLOF: Independent  PATIENT GOALS: Not have pain and symptoms into hand.    OBJECTIVE:   DIAGNOSTIC FINDINGS:  09/05/22:  Multilevel degenerative disc disease most prominent at C4-C5 and C5-C6.  PATIENT SURVEYS:  FOTO.  POSTURE: rounded shoulders and forward head, loss of lordosis.  PALPATION: Tender with active trigger point over left UT.   CERVICAL ROM:  Bilateral active cervical rotation is 60 degrees.  Extension is 40 degrees and bilateral side bending is 15 degrees.  UPPER EXTREMITY ROM:  WNL.  UPPER EXTREMITY MMT:  Normal UE strength.  CERVICAL SPECIAL TESTS:  Normal UE DTR's.   Some pain reproduction with left shoulder impingement testing.   TODAY'S TREATMENT:                                                                                                                              DATE: Combo e'stim/US at 1.50 w/CM2 x 8 minutes f/b  f/b HMP and IFC at 80-150 Hz on 40% scan x 10 minutes to patient's left UT region f/b intermittent cervical traction at 20# with a 99 sec hold and 5 sec rest to 5# x 15 minutes.   Normal modality response following removal of modality.     PATIENT EDUCATION:   Education details: Chin tucks and extension and also facilitated with a towel. Person educated: Patient Education method: Medical illustrator Education comprehension: verbalized understanding and returned demonstration  HOME EXERCISE PROGRAM: As above.  ASSESSMENT:  CLINICAL IMPRESSION: Patient states he was been doing his home exercises.  He did very well with intermittent cervical traction and felt better after treatment.   OBJECTIVE IMPAIRMENTS: decreased activity tolerance, decreased ROM, increased muscle spasms, postural dysfunction, and pain.   ACTIVITY LIMITATIONS: carrying, lifting, and reach over head  PARTICIPATION LIMITATIONS: occupation  PERSONAL FACTORS: Time since onset of injury/illness/exacerbation are also affecting patient's functional outcome.   REHAB POTENTIAL: Excellent  CLINICAL DECISION MAKING: Stable/uncomplicated  EVALUATION COMPLEXITY: Low   GOALS:  SHORT TERM GOALS: Target date: 09/28/22.  Ind with a HEP.  Goal status: INITIAL   LONG TERM GOALS: Target date: 10/26/22  Increase active cervical rotation to 70 degrees+ so patient can turn head more easily while driving.  Goal status: INITIAL  2.  Eliminate UE symptoms.  Goal status: INITIAL  3.  Perform ADL's with pain not > 2-3/10.  Goal status: INITIAL   PLAN:  PT FREQUENCY: 2x/week  PT DURATION: 6 weeks  PLANNED INTERVENTIONS: Therapeutic exercises, Therapeutic activity, Patient/Family education, Self Care, Dry Needling, Electrical stimulation, Cryotherapy, Moist heat, Traction, Ultrasound, and Manual therapy  PLAN FOR NEXT SESSION: Review HEP, combo e'stim/US, dry needling, STW/M, may consider intermittent traction beginning at 15#.   Torrie Lafavor, [name], PT 09/22/2022, 10:01 AM

## 2022-09-27 ENCOUNTER — Ambulatory Visit: Payer: Medicare Other | Admitting: Physical Therapy

## 2022-09-27 ENCOUNTER — Encounter: Payer: Self-pay | Admitting: Physical Therapy

## 2022-09-27 DIAGNOSIS — M62838 Other muscle spasm: Secondary | ICD-10-CM | POA: Diagnosis not present

## 2022-09-27 DIAGNOSIS — R202 Paresthesia of skin: Secondary | ICD-10-CM | POA: Diagnosis not present

## 2022-09-27 DIAGNOSIS — M542 Cervicalgia: Secondary | ICD-10-CM | POA: Diagnosis not present

## 2022-09-27 DIAGNOSIS — R200 Anesthesia of skin: Secondary | ICD-10-CM | POA: Diagnosis not present

## 2022-09-27 NOTE — Therapy (Signed)
OUTPATIENT PHYSICAL THERAPY CERVICAL EVALUATION   Patient Name: Arthur Foster MRN: [id number] DOB:May 05, 1950, 72 y.o., male Today's Date: 09/27/2022  END OF SESSION:  PT End of Session - 09/27/22 1008     Visit Number 4    Number of Visits 12    Date for PT Re-Evaluation 10/26/22    Authorization Type FOTO    PT Start Time 0848    PT Stop Time 0937    PT Time Calculation (min) 49 min    Activity Tolerance Patient tolerated treatment well    Behavior During Therapy WFL for tasks assessed/performed             Past Medical History:  Diagnosis Date   Anxiety    BPH (benign prostatic hyperplasia)    Depression    Hyperlipidemia    Hypertension    Testosterone deficiency    Past Surgical History:  Procedure Laterality Date   FOOT SURGERY     HERNIA REPAIR Left inguinal   Patient Active Problem List   Diagnosis Date Noted   Prediabetes 05/11/2022   Preop examination 01/11/2021   Back pain 06/24/2020   Dysuria 06/24/2020   Tendonitis, Achilles, right 05/27/2019   Controlled substance agreement signed 11/22/2018   Blood glucose elevated 11/22/2018   Vitamin D deficiency 11/22/2018   Sensorineural hearing loss (SNHL), bilateral 06/17/2018   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  THERAPY DIAG:  Cervicalgia  Other muscle spasm  Rationale for Evaluation and Treatment:  Rehabilitation  ONSET DATE: November 2023.  SUBJECTIVE:                                                                                                                                                                                                         SUBJECTIVE STATEMENT: Traction helped.  PERTINENT HISTORY:  HTN.  PAIN:  Are you having pain? Yes: NPRS scale: 3-4/10 Pain location: Left cervical. Pain description: Numbness, pins and needles. Aggravating factors: Increased use of left UE. Relieving factors: Rest.  PRECAUTIONS: None  WEIGHT  BEARING RESTRICTIONS: No  FALLS:  Has patient fallen in last 6 months? No  LIVING ENVIRONMENT: Lives with: lives with their spouse Lives in: House/apartment Has following equipment at home: None  OCCUPATION: Personnel officer   PLOF: Independent  PATIENT GOALS: Not have pain and symptoms into hand.    OBJECTIVE:   DIAGNOSTIC FINDINGS:  09/05/22:  Multilevel degenerative disc disease most prominent at C4-C5 and C5-C6.  PATIENT SURVEYS:  FOTO.  POSTURE: rounded shoulders and forward head, loss of lordosis.  PALPATION: Tender with active trigger point over left UT.   CERVICAL ROM:  Bilateral active cervical rotation is 60 degrees.  Extension is 40 degrees and bilateral side bending is 15 degrees.  UPPER EXTREMITY ROM:  WNL.  UPPER EXTREMITY MMT:  Normal UE strength.  CERVICAL SPECIAL TESTS:  Normal UE DTR's.   Some pain reproduction with left shoulder impingement testing.   TODAY'S TREATMENT:                                                                                                                              DATE: Combo e'stim/US at 1.50 w/CM2 x 12 minutes f/b  STW/M x 12 minutes to patient's left UT region to reduce tone f/b intermittent cervical traction at 23# with a 99 sec hold and 5 sec rest to 5# x 15 minutes.   Normal modality response following removal of modality.     PATIENT EDUCATION:  Education  details: Chin tucks and extension and also facilitated with a towel. Person educated: Patient Education method: Medical illustrator Education comprehension: verbalized understanding and returned demonstration  HOME EXERCISE PROGRAM: As above.  ASSESSMENT:  CLINICAL IMPRESSION: Good response to traction.  He tolerated a 3# increase in traction without complaint.  OBJECTIVE IMPAIRMENTS: decreased activity tolerance, decreased ROM, increased muscle spasms, postural dysfunction, and pain.   ACTIVITY LIMITATIONS: carrying, lifting, and reach over head  PARTICIPATION LIMITATIONS: occupation  PERSONAL FACTORS: Time since onset of injury/illness/exacerbation are also affecting patient's functional outcome.   REHAB POTENTIAL: Excellent  CLINICAL DECISION MAKING: Stable/uncomplicated  EVALUATION COMPLEXITY: Low   GOALS:  SHORT TERM GOALS: Target date: 09/28/22.  Ind with a HEP.  Goal status: INITIAL   LONG TERM GOALS: Target date: 10/26/22  Increase active cervical rotation to 70 degrees+ so patient can turn head more easily while driving.  Goal status: INITIAL  2.  Eliminate UE symptoms.  Goal status: INITIAL  3.  Perform ADL's with pain not > 2-3/10.  Goal status: INITIAL   PLAN:  PT FREQUENCY: 2x/week  PT DURATION: 6 weeks  PLANNED INTERVENTIONS: Therapeutic exercises, Therapeutic activity, Patient/Family education, Self Care, Dry Needling, Electrical stimulation, Cryotherapy, Moist heat, Traction, Ultrasound, and Manual therapy  PLAN FOR NEXT SESSION: Review HEP, combo e'stim/US, dry needling, STW/M, may consider intermittent traction beginning at 15#.   Kalee Mcclenathan, [name], PT 09/27/2022, 11:05 AM

## 2022-09-28 DIAGNOSIS — Z1152 Encounter for screening for COVID-19: Secondary | ICD-10-CM | POA: Diagnosis not present

## 2022-09-28 DIAGNOSIS — Z20822 Contact with and (suspected) exposure to covid-19: Secondary | ICD-10-CM | POA: Diagnosis not present

## 2022-09-29 ENCOUNTER — Ambulatory Visit: Payer: Medicare Other | Admitting: Physical Therapy

## 2022-09-29 DIAGNOSIS — M542 Cervicalgia: Secondary | ICD-10-CM | POA: Diagnosis not present

## 2022-09-29 DIAGNOSIS — M62838 Other muscle spasm: Secondary | ICD-10-CM

## 2022-09-29 DIAGNOSIS — R200 Anesthesia of skin: Secondary | ICD-10-CM | POA: Diagnosis not present

## 2022-09-29 DIAGNOSIS — R202 Paresthesia of skin: Secondary | ICD-10-CM | POA: Diagnosis not present

## 2022-09-29 NOTE — Therapy (Signed)
OUTPATIENT PHYSICAL THERAPY CERVICAL EVALUATION   Patient Name: Alishan Brunsvold MRN: [id number] DOB:20-Apr-1950, 72 y.o., male Today's Date: 09/29/2022  END OF SESSION:  PT End of Session - 09/29/22 0942     Visit Number 5    Number of Visits 12    Date for PT Re-Evaluation 10/26/22    Authorization Type FOTO    PT Start Time 0847    PT Stop Time 0936    PT Time Calculation (min) 49 min    Activity Tolerance Patient tolerated treatment well    Behavior During Therapy [facility] for tasks assessed/performed             Past Medical History:  Diagnosis Date   Anxiety    BPH (benign prostatic hyperplasia)    Depression    Hyperlipidemia    Hypertension    Testosterone deficiency    Past Surgical History:  Procedure Laterality Date   FOOT SURGERY     HERNIA REPAIR Left inguinal   Patient Active Problem List   Diagnosis Date Noted   Prediabetes 05/11/2022   Preop examination 01/11/2021   Back pain 06/24/2020   Dysuria 06/24/2020   Tendonitis, Achilles, right 05/27/2019   Controlled substance agreement signed 11/22/2018   Blood glucose elevated 11/22/2018   Vitamin D deficiency 11/22/2018   Sensorineural hearing loss (SNHL), bilateral 06/17/2018   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  THERAPY DIAG:  Cervicalgia  Other muscle spasm  Rationale for Evaluation and Treatment:  Rehabilitation  ONSET DATE: November 2023.  SUBJECTIVE:                                                                                                                                                                                                         SUBJECTIVE STATEMENT: Traction helped.  Neck feel better.  PERTINENT HISTORY:  HTN.  PAIN:  Are you having pain? Yes: NPRS scale: 2/10 Pain location: Left cervical. Pain description: Numbness, pins and needles. Aggravating factors: Increased use of left UE. Relieving factors: Rest.  PRECAUTIONS: None  WEIGHT BEARING RESTRICTIONS: No  FALLS:  Has patient fallen in last 6 months? No  LIVING ENVIRONMENT: Lives with: lives with their spouse Lives in: House/apartment Has following equipment at home: None  OCCUPATION: Personnel officer   PLOF: Independent  PATIENT GOALS: Not have pain and symptoms into hand.    OBJECTIVE:   DIAGNOSTIC FINDINGS:  09/05/22:  Multilevel degenerative disc disease most prominent at C4-C5 and C5-C6.  PATIENT SURVEYS:  FOTO.  POSTURE: rounded shoulders and forward head, loss of lordosis.  PALPATION: Tender with active trigger point over left UT.   CERVICAL ROM:  Bilateral active cervical rotation is 60 degrees.  Extension is 40 degrees and bilateral side bending is 15 degrees.  UPPER EXTREMITY ROM:  WNL.  UPPER EXTREMITY MMT:  Normal UE strength.  CERVICAL SPECIAL TESTS:  Normal UE DTR's.   Some pain reproduction with left shoulder impingement testing.   TODAY'S TREATMENT:                                                                                                                              DATE: Combo e'stim/US at 1.50 w/CM2 x 12 minutes f/b  STW/M x 11 minutes to patient's left UT region to reduce tone f/b intermittent cervical traction at 25# with a 99 sec hold and 5 sec rest to 5# x 15 minutes.   Normal modality response following removal of modality.     PATIENT  EDUCATION:  Education details: Chin tucks and extension and also facilitated with a towel. Person educated: Patient Education method: Medical illustrator Education comprehension: verbalized understanding and returned demonstration  HOME EXERCISE PROGRAM: As above.  ASSESSMENT:  CLINICAL IMPRESSION: Good response to traction.  He tolerated a 2# increase in traction without complaint.  No pain reported after treatment.  OBJECTIVE IMPAIRMENTS: decreased activity tolerance, decreased ROM, increased muscle spasms, postural dysfunction, and pain.   ACTIVITY LIMITATIONS: carrying, lifting, and reach over head  PARTICIPATION LIMITATIONS: occupation  PERSONAL FACTORS: Time since onset of injury/illness/exacerbation are also affecting patient's functional outcome.   REHAB POTENTIAL: Excellent  CLINICAL DECISION MAKING: Stable/uncomplicated  EVALUATION COMPLEXITY: Low   GOALS:  SHORT TERM GOALS: Target date: 09/28/22.  Ind with a HEP.  Goal status: INITIAL   LONG TERM GOALS: Target date: 10/26/22  Increase active cervical rotation to 70 degrees+ so patient can turn head more easily while driving.  Goal status: INITIAL  2.  Eliminate UE symptoms.  Goal status: INITIAL  3.  Perform ADL's with pain not > 2-3/10.  Goal status: INITIAL   PLAN:  PT FREQUENCY: 2x/week  PT DURATION: 6 weeks  PLANNED INTERVENTIONS: Therapeutic exercises, Therapeutic activity, Patient/Family education, Self Care, Dry Needling, Electrical stimulation, Cryotherapy, Moist heat, Traction, Ultrasound, and Manual therapy  PLAN FOR NEXT SESSION: Review HEP, combo e'stim/US, dry needling, STW/M, may consider intermittent traction beginning at 15#.   Johnney Scarlata, [name], PT 09/29/2022, 9:45 AM

## 2022-10-04 ENCOUNTER — Ambulatory Visit: Payer: Medicare Other | Attending: Family Medicine | Admitting: Physical Therapy

## 2022-10-04 DIAGNOSIS — M25562 Pain in left knee: Secondary | ICD-10-CM | POA: Diagnosis not present

## 2022-10-04 DIAGNOSIS — M62838 Other muscle spasm: Secondary | ICD-10-CM | POA: Diagnosis not present

## 2022-10-04 DIAGNOSIS — M542 Cervicalgia: Secondary | ICD-10-CM | POA: Diagnosis not present

## 2022-10-04 NOTE — Therapy (Signed)
OUTPATIENT PHYSICAL THERAPY CERVICAL EVALUATION   Patient Name: Arthur Foster MRN: [id number] DOB:1951-04-03, 72 y.o., male Today's Date: 10/04/2022  END OF SESSION:  PT End of Session - 10/04/22 1004     Visit Number 6    Number of Visits 12    Date for PT Re-Evaluation 10/26/22    Authorization Type FOTO    PT Start Time 0845    PT Stop Time 0935    PT Time Calculation (min) 50 min    Activity Tolerance Patient tolerated treatment well    Behavior During Therapy WFL for tasks assessed/performed             Past Medical History:  Diagnosis Date   Anxiety    BPH (benign prostatic hyperplasia)    Depression    Hyperlipidemia    Hypertension    Testosterone deficiency    Past Surgical History:  Procedure Laterality Date   FOOT SURGERY     HERNIA REPAIR Left inguinal   Patient Active Problem List   Diagnosis Date Noted   Prediabetes 05/11/2022   Preop examination 01/11/2021   Back pain 06/24/2020   Dysuria 06/24/2020   Tendonitis, Achilles, right 05/27/2019   Controlled substance agreement signed 11/22/2018   Blood glucose elevated 11/22/2018   Vitamin D deficiency 11/22/2018   Sensorineural hearing loss (SNHL), bilateral 06/17/2018   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  THERAPY DIAG:  No diagnosis found.  Rationale for Evaluation and Treatment:  Rehabilitation  ONSET DATE: November 2023.  SUBJECTIVE:                                                                                                                                                                                                         SUBJECTIVE STATEMENT: Traction helped.  Neck feels a lot better.  PERTINENT HISTORY:  HTN.  PAIN:  Are you having pain? Yes: NPRS scale: 2/10 Pain location: Left cervical. Pain description: Numbness, pins and needles. Aggravating factors: Increased use of left UE. Relieving factors: Rest.  PRECAUTIONS: None  WEIGHT BEARING RESTRICTIONS: No  FALLS:  Has patient fallen in last 6 months? No  LIVING ENVIRONMENT: Lives with: lives with their spouse Lives in: House/apartment Has following equipment at home: None  OCCUPATION: Personnel officer   PLOF: Independent  PATIENT GOALS: Not have pain and symptoms into hand.    OBJECTIVE:   DIAGNOSTIC FINDINGS:  09/05/22:  Multilevel degenerative disc disease most prominent at C4-C5 and C5-C6.  PATIENT SURVEYS:  FOTO.  POSTURE: rounded shoulders and forward head, loss of lordosis.  PALPATION: Tender with active trigger point over left UT.   CERVICAL ROM:  Bilateral active cervical rotation is 60 degrees.  Extension is 40 degrees and bilateral side bending is 15 degrees.  UPPER EXTREMITY ROM:  WNL.  UPPER EXTREMITY MMT:  Normal UE strength.  CERVICAL SPECIAL TESTS:  Normal UE DTR's.   Some pain reproduction with left shoulder impingement testing.   TODAY'S TREATMENT:                                                                                                                              DATE: Combo e'stim/US at 1.50 w/CM2 x 12 minutes f/b  STW/M x 11 minutes to patient's left UT region to reduce tone f/b intermittent cervical traction at 25# with a 99 sec hold and 5 sec rest to 5# x 15 minutes.   Normal modality response following removal of modality.     PATIENT  EDUCATION:  Education details: Chin tucks and extension and also facilitated with a towel. Person educated: Patient Education method: Medical illustrator Education comprehension: verbalized understanding and returned demonstration  HOME EXERCISE PROGRAM: As above.  ASSESSMENT:  CLINICAL IMPRESSION:  Excellent response to treatments.  Low pain today and a significant decrease in left UE symptoms (tingling).  OBJECTIVE IMPAIRMENTS: decreased activity tolerance, decreased ROM, increased muscle spasms, postural dysfunction, and pain.   ACTIVITY LIMITATIONS: carrying, lifting, and reach over head  PARTICIPATION LIMITATIONS: occupation  PERSONAL FACTORS: Time since onset of injury/illness/exacerbation are also affecting patient's functional outcome.   REHAB POTENTIAL: Excellent  CLINICAL DECISION MAKING: Stable/uncomplicated  EVALUATION COMPLEXITY: Low   GOALS:  SHORT TERM GOALS: Target date: 09/28/22.  Ind with a HEP.  Goal status: INITIAL   LONG TERM GOALS: Target date: 10/26/22  Increase active cervical rotation to 70 degrees+ so patient can turn head more easily while driving.  Goal status: INITIAL  2.  Eliminate UE symptoms.  Goal status: INITIAL  3.  Perform ADL's with pain not > 2-3/10.  Goal status: INITIAL   PLAN:  PT FREQUENCY: 2x/week  PT DURATION: 6 weeks  PLANNED INTERVENTIONS: Therapeutic exercises, Therapeutic activity, Patient/Family education, Self Care, Dry Needling, Electrical stimulation, Cryotherapy, Moist heat, Traction, Ultrasound, and Manual therapy  PLAN FOR NEXT SESSION: Review HEP, combo e'stim/US, dry needling, STW/M, may consider intermittent traction beginning at 15#.   Faiz Weber, [name], PT 10/04/2022, 10:05 AM

## 2022-10-12 ENCOUNTER — Ambulatory Visit: Payer: Medicare Other | Admitting: Physical Therapy

## 2022-10-16 ENCOUNTER — Encounter: Payer: Self-pay | Admitting: Physical Therapy

## 2022-10-16 ENCOUNTER — Ambulatory Visit: Payer: Medicare Other | Admitting: Physical Therapy

## 2022-10-16 DIAGNOSIS — M542 Cervicalgia: Secondary | ICD-10-CM

## 2022-10-16 DIAGNOSIS — M62838 Other muscle spasm: Secondary | ICD-10-CM

## 2022-10-16 DIAGNOSIS — M25562 Pain in left knee: Secondary | ICD-10-CM | POA: Diagnosis not present

## 2022-10-16 NOTE — Therapy (Signed)
OUTPATIENT PHYSICAL THERAPY CERVICAL EVALUATION   Patient Name: Ismar Yabut MRN: [id number] DOB:13-Nov-1950, 72 y.o., male Today's Date: 10/16/2022  END OF SESSION:  PT End of Session - 10/16/22 0843     Visit Number 7    Number of Visits 12    Date for PT Re-Evaluation 10/26/22    Authorization Type FOTO    PT Start Time 0800    PT Stop Time 0848    PT Time Calculation (min) 48 min    Activity Tolerance Patient tolerated treatment well    Behavior During Therapy WFL for tasks assessed/performed             Past Medical History:  Diagnosis Date   Anxiety    BPH (benign prostatic hyperplasia)    Depression    Hyperlipidemia    Hypertension    Testosterone deficiency    Past Surgical History:  Procedure Laterality Date   FOOT SURGERY     HERNIA REPAIR Left inguinal   Patient Active Problem List   Diagnosis Date Noted   Prediabetes 05/11/2022   Preop examination 01/11/2021   Back pain 06/24/2020   Dysuria 06/24/2020   Tendonitis, Achilles, right 05/27/2019   Controlled substance agreement signed 11/22/2018   Blood glucose elevated 11/22/2018   Vitamin D deficiency 11/22/2018   Sensorineural hearing loss (SNHL), bilateral 06/17/2018   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  THERAPY DIAG:  Cervicalgia  Other muscle spasm  Rationale for Evaluation and Treatment:  Rehabilitation  ONSET DATE: November 2023.  SUBJECTIVE:                                                                                                                                                                                                         SUBJECTIVE STATEMENT: Had a flare-up a few days ago but better now.  Patient has not been doing his HEP.   PERTINENT HISTORY:  HTN.  PAIN:  Are you having pain? Yes: NPRS scale: 2/10 Pain location: Left cervical. Pain description: Numbness, pins  and needles. Aggravating factors: Increased use of left UE. Relieving factors: Rest.  PRECAUTIONS: None  WEIGHT BEARING RESTRICTIONS: No  FALLS:  Has patient fallen in last 6 months? No  LIVING ENVIRONMENT: Lives with: lives with their spouse Lives in: House/apartment Has following equipment at home: None  OCCUPATION: Personnel officer   PLOF: Independent  PATIENT GOALS: Not have pain and symptoms into hand.    OBJECTIVE:   DIAGNOSTIC FINDINGS:  09/05/22:  Multilevel degenerative disc disease most prominent at C4-C5 and C5-C6.  PATIENT SURVEYS:  FOTO.  POSTURE: rounded shoulders and forward head, loss of lordosis.  PALPATION: Tender with active trigger point over left UT.   CERVICAL ROM:  Bilateral active cervical rotation is 60 degrees.  Extension is 40 degrees and bilateral side bending is 15 degrees.  UPPER EXTREMITY ROM:  WNL.  UPPER EXTREMITY MMT:  Normal UE strength.  CERVICAL SPECIAL TESTS:  Normal UE DTR's.   Some pain reproduction with left shoulder impingement testing.   TODAY'S TREATMENT:                                                                                                                              DATE: Combo e'stim/US at 1.50 w/CM2 x 12 minutes f/b  STW/M x 11 minutes to patient's left UT region to reduce tone f/b intermittent cervical traction at 25# with a 99 sec hold and 5 sec rest to 5# x 15 minutes.   Normal modality response  following removal of modality.     PATIENT EDUCATION:  Education details: Chin tucks and extension and also facilitated with a towel. Person educated: Patient Education method: Medical illustrator Education comprehension: verbalized understanding and returned demonstration  HOME EXERCISE PROGRAM: As above.  ASSESSMENT:  CLINICAL IMPRESSION:  Patient had a flare-up recently but better now.  Reviewed HEP and recommended he perform several times daily.  Also discussed performing dry needling.  He tolerated tx without complaint today.    OBJECTIVE IMPAIRMENTS: decreased activity tolerance, decreased ROM, increased muscle spasms, postural dysfunction, and pain.   ACTIVITY LIMITATIONS: carrying, lifting, and reach over head  PARTICIPATION LIMITATIONS: occupation  PERSONAL FACTORS: Time since onset of injury/illness/exacerbation are also affecting patient's functional outcome.   REHAB POTENTIAL: Excellent  CLINICAL DECISION MAKING: Stable/uncomplicated  EVALUATION COMPLEXITY: Low   GOALS:  SHORT TERM GOALS: Target date: 09/28/22.  Ind with a HEP.  Goal status: INITIAL   LONG TERM GOALS: Target date: 10/26/22  Increase active cervical rotation to 70 degrees+ so patient can turn head more easily while driving.  Goal status: INITIAL  2.  Eliminate UE symptoms.  Goal status: INITIAL  3.  Perform ADL's with pain not > 2-3/10.  Goal status: INITIAL   PLAN:  PT FREQUENCY: 2x/week  PT DURATION: 6 weeks  PLANNED INTERVENTIONS: Therapeutic exercises, Therapeutic activity, Patient/Family education, Self Care, Dry Needling, Electrical stimulation, Cryotherapy, Moist heat, Traction, Ultrasound, and Manual therapy  PLAN FOR NEXT SESSION: Review HEP,  combo e'stim/US, dry needling, STW/M, may consider intermittent traction beginning at 15#.   Marv Alfrey, [name], PT 10/16/2022, 8:55 AM

## 2022-10-18 ENCOUNTER — Encounter: Payer: Medicare Other | Admitting: Physical Therapy

## 2022-10-19 ENCOUNTER — Ambulatory Visit: Payer: Medicare Other | Admitting: *Deleted

## 2022-10-19 ENCOUNTER — Encounter: Payer: Self-pay | Admitting: *Deleted

## 2022-10-19 DIAGNOSIS — M25562 Pain in left knee: Secondary | ICD-10-CM | POA: Diagnosis not present

## 2022-10-19 DIAGNOSIS — M62838 Other muscle spasm: Secondary | ICD-10-CM

## 2022-10-19 DIAGNOSIS — M542 Cervicalgia: Secondary | ICD-10-CM

## 2022-10-19 NOTE — Therapy (Signed)
OUTPATIENT PHYSICAL THERAPY CERVICAL EVALUATION   Patient Name: Jayquan Bradsher MRN: [id number] DOB:[birth year], 72 y.o., male Today's Date: 10/19/2022  END OF SESSION:  PT End of Session - 10/19/22 0819     Visit Number 8    Number of Visits 12    Date for PT Re-Evaluation 10/26/22    Authorization Type FOTO    PT Start Time 0800    PT Stop Time 0851    PT Time Calculation (min) 51 min             Past Medical History:  Diagnosis Date   Anxiety    BPH (benign prostatic hyperplasia)    Depression    Hyperlipidemia    Hypertension    Testosterone deficiency    Past Surgical History:  Procedure Laterality Date   FOOT SURGERY     HERNIA REPAIR Left inguinal   Patient Active Problem List   Diagnosis Date Noted   Prediabetes 05/11/2022   Preop examination 01/11/2021   Back pain 06/24/2020   Dysuria 06/24/2020   Tendonitis, Achilles, right 05/27/2019   Controlled substance agreement signed 11/22/2018   Blood glucose elevated 11/22/2018   Vitamin D deficiency 11/22/2018   Sensorineural hearing loss (SNHL), bilateral 06/17/2018   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  THERAPY DIAG:  Cervicalgia  Other muscle spasm  Rationale for Evaluation and Treatment: Rehabilitation  ONSET DATE: November 2023.  SUBJECTIVE:                                                                                                                                                                                                          SUBJECTIVE STATEMENT: Did okay after last Rx    PERTINENT HISTORY:  HTN.  PAIN:  Are you having pain? Yes: NPRS scale: 2/10 Pain location: Left cervical. Pain description: Numbness, pins and needles. Aggravating factors: Increased use of left UE. Relieving factors: Rest.  PRECAUTIONS: None  WEIGHT BEARING RESTRICTIONS: No  FALLS:  Has patient fallen in last 6 months? No  LIVING ENVIRONMENT: Lives with: lives with their spouse Lives in: House/apartment Has following equipment at home: None  OCCUPATION: Personnel officer   PLOF: Independent  PATIENT GOALS: Not have pain and symptoms into hand.    OBJECTIVE:   DIAGNOSTIC FINDINGS:  09/05/22:  Multilevel degenerative disc disease most prominent at C4-C5 and C5-C6.  PATIENT SURVEYS:  FOTO.  POSTURE: rounded shoulders and forward head, loss of lordosis.  PALPATION: Tender with active trigger point over left UT.   CERVICAL ROM:  Bilateral active cervical rotation is 60 degrees.  Extension is 40 degrees and bilateral side bending is 15 degrees.  UPPER EXTREMITY ROM:  WNL.  UPPER EXTREMITY MMT:  Normal UE strength.  CERVICAL SPECIAL TESTS:  Normal UE DTR's.   Some pain reproduction with left shoulder impingement testing.   TODAY'S TREATMENT:                                                                                                                              DATE:                                                                 10-19-22 UBE X 5 MINS Combo e'stim/US at 1.50 w/CM2 x10  minutes TO LT UT   STW/M x 10 minutes to patient's left UT region to reduce tone   Intermittent cervical traction at 25# with a 99 sec hold and 5 sec rest to 5# x 15 minutes.   Normal modality response following removal of modality.     PATIENT EDUCATION:  Education details: Chin tucks  and extension and also facilitated with a towel. Person educated: Patient Education method: Medical illustrator Education comprehension: verbalized understanding and returned demonstration  HOME EXERCISE PROGRAM: As above.  ASSESSMENT:  CLINICAL IMPRESSION: Pt arrived today doing fairly well, but with tightness and pain LT side UT. [place] combo and Manual STW performed to LT UT and into cerv. Paras f/b cerv. Traction at 25#s and tolerated well. Decreased pain end of session.  OBJECTIVE IMPAIRMENTS: decreased activity tolerance, decreased ROM, increased muscle spasms, postural dysfunction, and pain.   ACTIVITY LIMITATIONS: carrying, lifting, and reach over head  PARTICIPATION LIMITATIONS: occupation  PERSONAL FACTORS: Time since onset of injury/illness/exacerbation are also affecting patient's functional outcome.   REHAB POTENTIAL: Excellent  CLINICAL DECISION MAKING: Stable/uncomplicated  EVALUATION COMPLEXITY: Low   GOALS:  SHORT TERM GOALS: Target date: 09/28/22.  Ind with a HEP.  Goal status: INITIAL   LONG TERM GOALS: Target date: 10/26/22  Increase active cervical rotation to 70 degrees+ so patient can turn head more easily while driving.  Goal status: INITIAL  2.  Eliminate UE symptoms.  Goal status: INITIAL  3.  Perform ADL's with pain not >  2-3/10.  Goal status: INITIAL   PLAN:  PT FREQUENCY: 2x/week  PT DURATION: 6 weeks  PLANNED INTERVENTIONS: Therapeutic exercises, Therapeutic activity, Patient/Family education, Self Care, Dry Needling, Electrical stimulation, Cryotherapy, Moist heat, Traction, Ultrasound, and Manual therapy  PLAN FOR NEXT SESSION: Review HEP, combo e'stim/US, dry needling, STW/M, may consider intermittent traction beginning at 15#.   Samara Stankowski,CHR[place], PTA 10/19/2022, 4:18 PM

## 2022-10-23 ENCOUNTER — Encounter: Payer: Self-pay | Admitting: *Deleted

## 2022-10-23 ENCOUNTER — Ambulatory Visit: Payer: Medicare Other | Admitting: *Deleted

## 2022-10-23 DIAGNOSIS — M62838 Other muscle spasm: Secondary | ICD-10-CM | POA: Diagnosis not present

## 2022-10-23 DIAGNOSIS — M542 Cervicalgia: Secondary | ICD-10-CM

## 2022-10-23 DIAGNOSIS — M25562 Pain in left knee: Secondary | ICD-10-CM | POA: Diagnosis not present

## 2022-10-23 NOTE — Therapy (Signed)
OUTPATIENT PHYSICAL THERAPY CERVICAL EVALUATION   Patient Name: Arthur Foster MRN: [id number] DOB:04/14/1950, 72 y.o., male Today's Date: 10/23/2022  END OF SESSION:  PT End of Session - 10/23/22 0757     Visit Number 9    Number of Visits 12    Date for PT Re-Evaluation 10/26/22    PT Start Time 0800    PT Stop Time 0853    PT Time Calculation (min) 53 min             Past Medical History:  Diagnosis Date   Anxiety    BPH (benign prostatic hyperplasia)    Depression    Hyperlipidemia    Hypertension    Testosterone deficiency    Past Surgical History:  Procedure Laterality Date   FOOT SURGERY     HERNIA REPAIR Left inguinal   Patient Active Problem List   Diagnosis Date Noted   Prediabetes 05/11/2022   Preop examination 01/11/2021   Back pain 06/24/2020   Dysuria 06/24/2020   Tendonitis, Achilles, right 05/27/2019   Controlled substance agreement signed 11/22/2018   Blood glucose elevated 11/22/2018   Vitamin D deficiency 11/22/2018   Sensorineural hearing loss (SNHL), bilateral 06/17/2018   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  THERAPY DIAG:  Cervicalgia  Other muscle spasm  Rationale for Evaluation and Treatment: Rehabilitation  ONSET DATE: November 2023.  SUBJECTIVE:                                                                                                                                                                                                          SUBJECTIVE STATEMENT: Did okay after last Rx, But today Lt side has more pain   PERTINENT HISTORY:  HTN.  PAIN:  Are you having pain? Yes: NPRS scale: 2/10 Pain location: Left cervical. Pain description: Numbness, pins and needles. Aggravating factors: Increased use of left UE. Relieving factors: Rest.  PRECAUTIONS: None  WEIGHT BEARING RESTRICTIONS: No  FALLS:  Has patient fallen in last 6 months? No  LIVING ENVIRONMENT: Lives with: lives with their spouse Lives in: House/apartment Has following equipment at home: None  OCCUPATION: Personnel officer   PLOF: Independent  PATIENT GOALS: Not have pain and symptoms into hand.    OBJECTIVE:   DIAGNOSTIC FINDINGS:  09/05/22:  Multilevel degenerative disc disease most prominent at C4-C5 and C5-C6.  PATIENT SURVEYS:  FOTO.  POSTURE: rounded shoulders and forward head, loss of lordosis.  PALPATION: Tender with active trigger point over left UT.   CERVICAL ROM:  Bilateral active cervical rotation is 60 degrees.  Extension is 40 degrees and bilateral side bending is 15 degrees.  UPPER EXTREMITY ROM:  WNL.  UPPER EXTREMITY MMT:  Normal UE strength.  CERVICAL SPECIAL TESTS:  Normal UE DTR's.   Some pain reproduction with left shoulder impingement testing.   TODAY'S TREATMENT:                                                                                                                              DATE:                                                                 10-23-22 UBE X 5 MINS Combo e'stim/US at 1.50 w/CM2 x12  minutes TO LT UT   STW/M x 12 minutes to patient's left UT region to reduce tone   Intermittent cervical traction at 25# with a 99 sec hold and 5 sec rest to 5# x 15 minutes.   Normal modality response following removal of modality.     PATIENT EDUCATION:  Education details: Chin  tucks and extension and also facilitated with a towel. Person educated: Patient Education method: Medical illustrator Education comprehension: verbalized understanding and returned demonstration  HOME EXERCISE PROGRAM: As above.  ASSESSMENT:  CLINICAL IMPRESSION: Pt arrived today doing fairly well, but with tightness and pain LT side UT and into neck. Rx focused on decreasing pain with  [place] combo and Manual STW/TPR performed to LT UT and into cerv. Paras with good release f/b cerv. Traction at 25#s. Decreased pain end of session.Pt wants to try DN next week  OBJECTIVE IMPAIRMENTS: decreased activity tolerance, decreased ROM, increased muscle spasms, postural dysfunction, and pain.   ACTIVITY LIMITATIONS: carrying, lifting, and reach over head  PARTICIPATION LIMITATIONS: occupation  PERSONAL FACTORS: Time since onset of injury/illness/exacerbation are also affecting patient's functional outcome.   REHAB POTENTIAL: Excellent  CLINICAL DECISION MAKING: Stable/uncomplicated  EVALUATION COMPLEXITY: Low   GOALS:  SHORT TERM GOALS: Target date: 09/28/22.  Ind with a HEP.  Goal status: MET   LONG TERM GOALS: Target date: 10/26/22  Increase active cervical rotation to 70 degrees+ so patient can turn head more easily while driving.  Goal status: ON going  2.  Eliminate UE symptoms.  Goal status: ON going  3.  Perform ADL's with pain not > 2-3/10.  Goal status: On going   PLAN:  PT FREQUENCY: 2x/week  PT DURATION: 6 weeks  PLANNED INTERVENTIONS: Therapeutic exercises, Therapeutic activity, Patient/Family education, Self Care, Dry Needling, Electrical stimulation, Cryotherapy, Moist heat, Traction, Ultrasound, and Manual therapy  PLAN FOR NEXT SESSION:  combo e'stim/US, dry needling, STW/M, may consider intermittent traction beginning at 15#.   Haja Crego,[place], PTA 10/23/2022, 8:53 AM

## 2022-10-26 ENCOUNTER — Ambulatory Visit: Payer: Medicare Other | Admitting: *Deleted

## 2022-10-26 DIAGNOSIS — M542 Cervicalgia: Secondary | ICD-10-CM

## 2022-10-26 DIAGNOSIS — M62838 Other muscle spasm: Secondary | ICD-10-CM | POA: Diagnosis not present

## 2022-10-26 DIAGNOSIS — M25562 Pain in left knee: Secondary | ICD-10-CM

## 2022-10-26 NOTE — Therapy (Addendum)
OUTPATIENT PHYSICAL THERAPY CERVICAL TREATMENT   Patient Name: Arthur Foster MRN: [id number] DOB:06/15/1950, 72 y.o., male Today's Date: 10/26/2022  END OF SESSION:  PT End of Session - 10/26/22 0801     Visit Number 10    Number of Visits 12    Date for PT Re-Evaluation 10/26/22    Authorization Type FOTO    PT Start Time 0800    PT Stop Time 0854    PT Time Calculation (min) 54 min             Past Medical History:  Diagnosis Date   Anxiety    BPH (benign prostatic hyperplasia)    Depression    Hyperlipidemia    Hypertension    Testosterone deficiency    Past Surgical History:  Procedure Laterality Date   FOOT SURGERY     HERNIA REPAIR Left inguinal   Patient Active Problem List   Diagnosis Date Noted   Prediabetes 05/11/2022   Preop examination 01/11/2021   Back pain 06/24/2020   Dysuria 06/24/2020   Tendonitis, Achilles, right 05/27/2019   Controlled substance agreement signed 11/22/2018   Blood glucose elevated 11/22/2018   Vitamin D deficiency 11/22/2018   Sensorineural hearing loss (SNHL), bilateral 06/17/2018   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  THERAPY DIAG:  Cervicalgia  Other muscle spasm  Acute pain of left knee  Rationale for Evaluation and Treatment: Rehabilitation  ONSET DATE: November 2023.  SUBJECTIVE:                                                                                                                                                                                                          SUBJECTIVE STATEMENT: Did real good after last rx.  Pain LT side shldr. Sleeping better   PERTINENT HISTORY:  HTN.  PAIN:  Are you having pain? Yes: NPRS scale: 2/10 Pain location: Left cervical. Pain description: Numbness, pins and needles. Aggravating factors: Increased use of left UE. Relieving factors: Rest.  PRECAUTIONS: None  WEIGHT BEARING  RESTRICTIONS: No  FALLS:  Has patient fallen in last 6 months? No  LIVING ENVIRONMENT: Lives with: lives with their spouse Lives in: House/apartment Has following equipment at home: None  OCCUPATION: Personnel officer   PLOF: Independent  PATIENT GOALS: Not have pain and symptoms into hand.   OBJECTIVE:   DIAGNOSTIC FINDINGS:  09/05/22:  Multilevel degenerative disc disease most prominent at C4-C5 and C5-C6.  PATIENT SURVEYS:  FOTO.  POSTURE: rounded shoulders and forward head, loss of lordosis.  PALPATION: Tender with active trigger point over left UT.   CERVICAL ROM:  Bilateral active cervical rotation is 60 degrees.  Extension is 40 degrees and bilateral side bending is 15 degrees.  UPPER EXTREMITY ROM:  WNL.  UPPER EXTREMITY MMT:  Normal UE strength.  CERVICAL SPECIAL TESTS:  Normal UE DTR's.   Some pain reproduction with left shoulder impingement testing.   TODAY'S TREATMENT:                                                                                                                              DATE:                                                                 10-26-22  Combo e'stim/US at 1.50 w/CM2 x10 minutes TO LT UT   STW/M x 15 minutes to patient's left UT region to reduce tone   Intermittent cervical traction at 25# with a 99 sec hold and 5 sec rest to 5# x 15 minutes.   Normal modality response following removal of modality.      PATIENT EDUCATION:  Education details: Chin tucks and extension and also facilitated with a towel. Person educated: Patient Education method: Medical illustrator Education comprehension: verbalized understanding and returned demonstration  HOME EXERCISE PROGRAM: As above.  ASSESSMENT:  CLINICAL IMPRESSION: Pt arrived today doing with good relief after last Rx with decreased pain and soreness LT UT. He also reports decreased LT UE symptoms now by 50%. He was also able to meet LTG for ROM to 70 degrees rotation each way. Rx focused on decreasing pain with  [place] combo and Manual STW/TPR performed to LT UT and into cerv. Paras again with less pain now. Traction at 25#s. Decreased pain end of session.Pt wants to try DN next week  10/26/22 PROGRESS REPORT: Patient is making fair progress with skilled physical therapy as evidenced by his subjective reports and objective measures. He was able to demonstrate improved cervical rotation since his initial evaluation on 09/14/22. However, he continues to experienced radiating upper extremity symptoms. Recommend that he continue with skilled physical therapy to address his remaining impairments to return to his prior level of function.   Candi Leash, PT, DPT  OBJECTIVE IMPAIRMENTS: decreased activity tolerance, decreased ROM, increased muscle spasms, postural dysfunction, and pain.   ACTIVITY LIMITATIONS: carrying, lifting, and reach over head  PARTICIPATION LIMITATIONS: occupation  PERSONAL FACTORS: Time since onset of injury/illness/exacerbation are also affecting patient's functional outcome.   REHAB POTENTIAL: Excellent  CLINICAL DECISION MAKING: Stable/uncomplicated  EVALUATION COMPLEXITY: Low   GOALS:  SHORT TERM GOALS: Target date: 09/28/22.  Ind with a HEP.  Goal status: MET   LONG TERM GOALS: Target date: 10/26/22  Increase active cervical rotation to 70 degrees+ so patient can turn head more easily while driving.   Goal status:  MET 70 degrees today  2.  Eliminate UE symptoms.  Goal status: ON going    50% less  3.  Perform ADL's with pain not > 2-3/10.  Goal status: On going   PLAN:  PT FREQUENCY: 2x/week  PT DURATION: 6 weeks  PLANNED INTERVENTIONS: Therapeutic exercises, Therapeutic activity, Patient/Family education, Self Care, Dry Needling, Electrical stimulation, Cryotherapy, Moist heat, Traction, Ultrasound, and Manual therapy  PLAN FOR NEXT SESSION:  combo e'stim/US, dry needling, STW/M, Traction    Josiah Nieto,CHRIS, PTA 10/26/2022, 8:54 AM

## 2022-10-30 ENCOUNTER — Ambulatory Visit: Payer: Medicare Other | Admitting: *Deleted

## 2022-10-30 ENCOUNTER — Encounter: Payer: Self-pay | Admitting: *Deleted

## 2022-10-30 DIAGNOSIS — M25562 Pain in left knee: Secondary | ICD-10-CM | POA: Diagnosis not present

## 2022-10-30 DIAGNOSIS — M542 Cervicalgia: Secondary | ICD-10-CM | POA: Diagnosis not present

## 2022-10-30 DIAGNOSIS — M62838 Other muscle spasm: Secondary | ICD-10-CM | POA: Diagnosis not present

## 2022-10-30 NOTE — Therapy (Signed)
OUTPATIENT PHYSICAL THERAPY CERVICAL TREATMENT   Patient Name: Arthur Foster MRN: [id number] DOB:[birth year], 72 y.o., male Today's Date: 10/30/2022  END OF SESSION:  PT End of Session - 10/30/22 0932     Visit Number 11    Number of Visits 12    Date for PT Re-Evaluation 10/26/22    Authorization Type FOTO    PT Start Time 0930    PT Stop Time 1021    PT Time Calculation (min) 51 min             Past Medical History:  Diagnosis Date   Anxiety    BPH (benign prostatic hyperplasia)    Depression    Hyperlipidemia    Hypertension    Testosterone deficiency    Past Surgical History:  Procedure Laterality Date   FOOT SURGERY     HERNIA REPAIR Left inguinal   Patient Active Problem List   Diagnosis Date Noted   Prediabetes 05/11/2022   Preop examination 01/11/2021   Back pain 06/24/2020   Dysuria 06/24/2020   Tendonitis, Achilles, right 05/27/2019   Controlled substance agreement signed 11/22/2018   Blood glucose elevated 11/22/2018   Vitamin D deficiency 11/22/2018   Sensorineural hearing loss (SNHL), bilateral 06/17/2018   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  THERAPY DIAG:  Cervicalgia  Rationale for Evaluation and Treatment: Rehabilitation  ONSET DATE: November 2023.  SUBJECTIVE:                                                                                                                                                                                                          SUBJECTIVE STATEMENT: Did real good after last rx. Did good for a couple days and then symptoms returned for one day and then went away.   PERTINENT HISTORY:  HTN.  PAIN:  Are you having pain? Yes: NPRS scale: 2/10 Pain location: Left cervical. Pain description: Numbness, pins and needles. Aggravating factors: Increased use of left UE. Relieving factors: Rest.  PRECAUTIONS: None  WEIGHT BEARING  RESTRICTIONS: No  FALLS:  Has patient fallen in last 6 months? No  LIVING ENVIRONMENT: Lives with: lives with their spouse Lives in: House/apartment Has following equipment at home: None  OCCUPATION: Personnel officer   PLOF: Independent  PATIENT GOALS: Not have pain and symptoms into hand.   OBJECTIVE:   DIAGNOSTIC FINDINGS:  09/05/22:  Multilevel degenerative disc disease most prominent at C4-C5 and C5-C6.  PATIENT SURVEYS:  FOTO.  POSTURE: rounded shoulders and forward head, loss of lordosis.  PALPATION: Tender with active trigger point over left UT.   CERVICAL ROM:  Bilateral active cervical rotation is 60 degrees.  Extension is 40 degrees and bilateral side bending is 15 degrees.  UPPER EXTREMITY ROM:  WNL.  UPPER EXTREMITY MMT:  Normal UE strength.  CERVICAL SPECIAL TESTS:  Normal UE DTR's.   Some pain reproduction with left shoulder impingement testing.   TODAY'S TREATMENT:                                                                                                                              DATE:                                                                 10-30-22  Combo e'stim/US at 1.50 w/CM2 x12 minutes TO LT UT   STW/M x 12 minutes to patient's left UT region to reduce tone   Intermittent cervical traction at 25# with a 99 sec hold and 5 sec rest to 5# x 15 minutes.   Normal modality response following removal of  modality. Instructed Pt in use of thera-cane for home TP release as well as discussed possible Home [employer] traction unit    PATIENT EDUCATION:  Education details: Chin tucks and extension and also facilitated with a towel. Person educated: Patient Education method: Medical illustrator Education comprehension: verbalized understanding and returned demonstration  HOME EXERCISE PROGRAM: As above.  ASSESSMENT:  CLINICAL IMPRESSION: Pt arrived today doing fairly well and reports doing good overall, but still has flare-ups on certain days due to activity. Pt does well with current Rx and we discussed possible maintenance with home traction unit and theracane for TPR. Rx focused on decreasing pain with  [place] combo and Manual STW/TPR performed to LT UT and into cerv. Paras again with less pain now. Traction at 25#s.    10/26/22 PROGRESS REPORT: Patient is making fair progress with skilled physical therapy as evidenced by his subjective reports and objective measures. He was able to demonstrate improved cervical rotation since his initial evaluation on 09/14/22. However, he continues to experienced radiating upper extremity symptoms. Recommend that he continue with skilled physical therapy to address his remaining impairments to return to his prior level of function.  Candi Leash, PT, DPT   OBJECTIVE IMPAIRMENTS: decreased activity tolerance, decreased ROM, increased muscle spasms, postural dysfunction, and pain.   ACTIVITY LIMITATIONS: carrying, lifting, and reach over head  PARTICIPATION LIMITATIONS: occupation  PERSONAL FACTORS: Time since onset of injury/illness/exacerbation are also affecting patient's functional outcome.   REHAB POTENTIAL: Excellent  CLINICAL DECISION MAKING: Stable/uncomplicated  EVALUATION COMPLEXITY: Low   GOALS:  SHORT TERM GOALS: Target date: 09/28/22.  Ind with a HEP.  Goal status: MET   LONG TERM GOALS: Target date: 10/26/22  Increase  active cervical rotation to 70 degrees+ so patient can turn head more easily while driving.  Goal status:  MET 70 degrees today  2.  Eliminate UE symptoms.  Goal status: ON going    50% less  3.  Perform ADL's with pain not > 2-3/10.  Goal status: On going   PLAN:  PT FREQUENCY: 2x/week  PT DURATION: 6 weeks  PLANNED INTERVENTIONS: Therapeutic exercises, Therapeutic activity, Patient/Family education, Self Care, Dry Needling, Electrical stimulation, Cryotherapy, Moist heat, Traction, Ultrasound, and Manual therapy  PLAN FOR NEXT SESSION:  Possible DC and discuss home traction unit   Tymika Grilli,CHRIS, PTA 10/30/2022, 1:11 PM

## 2022-10-31 ENCOUNTER — Ambulatory Visit: Payer: Medicare Other | Admitting: *Deleted

## 2022-11-03 ENCOUNTER — Ambulatory Visit: Payer: Medicare Other | Attending: Family Medicine | Admitting: Physical Therapy

## 2022-11-03 DIAGNOSIS — M62838 Other muscle spasm: Secondary | ICD-10-CM | POA: Diagnosis not present

## 2022-11-03 DIAGNOSIS — M542 Cervicalgia: Secondary | ICD-10-CM | POA: Insufficient documentation

## 2022-11-03 NOTE — Therapy (Signed)
OUTPATIENT PHYSICAL THERAPY CERVICAL TREATMENT   Patient Name: Arthur Foster MRN: [id number] DOB:07-Oct-1950, 72 y.o., male Today's Date: 11/03/2022  END OF SESSION:  PT End of Session - 11/03/22 0937     Visit Number 12    Number of Visits 14    Date for PT Re-Evaluation 11/09/22    Authorization Type FOTO    PT Start Time 0800    PT Stop Time 0854    PT Time Calculation (min) 54 min    Activity Tolerance Patient tolerated treatment well    Behavior During Therapy WFL for tasks assessed/performed             Past Medical History:  Diagnosis Date   Anxiety    BPH (benign prostatic hyperplasia)    Depression    Hyperlipidemia    Hypertension    Testosterone deficiency    Past Surgical History:  Procedure Laterality Date   FOOT SURGERY     HERNIA REPAIR Left inguinal   Patient Active Problem List   Diagnosis Date Noted   Prediabetes 05/11/2022   Preop examination 01/11/2021   Back pain 06/24/2020   Dysuria 06/24/2020   Tendonitis, Achilles, right 05/27/2019   Controlled substance agreement signed 11/22/2018   Blood glucose elevated 11/22/2018   Vitamin D deficiency 11/22/2018   Sensorineural hearing loss (SNHL), bilateral 06/17/2018   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  THERAPY DIAG:  Cervicalgia - Plan: PT plan of care cert/re-cert  Other muscle spasm - Plan:  PT plan of care cert/re-cert  Rationale for Evaluation and Treatment: Rehabilitation  ONSET DATE: November 2023.  SUBJECTIVE:                                                                                                                                                                                                         SUBJECTIVE STATEMENT: Doing better but pain does come back.  He would like to try dry needling next week.   PERTINENT HISTORY:  HTN.  PAIN:  Are you having  pain? Yes: NPRS scale: 2/10 Pain location: Left cervical. Pain description: Numbness, pins and needles. Aggravating factors: Increased use of left UE. Relieving factors: Rest.  PRECAUTIONS: None  WEIGHT BEARING RESTRICTIONS: No  FALLS:  Has patient fallen in last 6 months? No  LIVING ENVIRONMENT: Lives with: lives with their spouse Lives in: House/apartment Has following equipment at home: None  OCCUPATION: Personnel officer   PLOF: Independent  PATIENT GOALS: Not have pain and symptoms into hand.   OBJECTIVE:   DIAGNOSTIC FINDINGS:  09/05/22:  Multilevel degenerative disc disease most prominent at C4-C5 and C5-C6.  PATIENT SURVEYS:  FOTO.  POSTURE: rounded shoulders and forward head, loss of lordosis.  PALPATION: Tender with active trigger point over left UT.   CERVICAL ROM:  Bilateral active cervical rotation is 60 degrees.  Extension is 40 degrees and bilateral side bending is 15 degrees.  UPPER EXTREMITY ROM:  WNL.  UPPER EXTREMITY MMT:  Normal UE strength.  CERVICAL SPECIAL TESTS:  Normal UE DTR's.   Some pain reproduction with left shoulder impingement testing.   TODAY'S TREATMENT:                                                                                                                              DATE:                                                                 11/03/22  Combo e'stim/US at 1.50 w/CM2 x12 minutes TO LT UT   STW/M x 11 minutes to patient's left UT  region to reduce tone   Intermittent cervical traction at 25# with a 99 sec hold and 5 sec rest to 5# x 15 minutes.   Normal modality response following removal of modality.    PATIENT EDUCATION:  Education details: Discussed dry needling and consent form provided to patient. Person educated: Patient Education method: handout/consent form. Education comprehension:   HOME EXERCISE PROGRAM: As above.  ASSESSMENT:  CLINICAL IMPRESSION: The patient did well with treatment and felt better following but hopes to have loner term relief.  He is wanting to try dry needling next week.       OBJECTIVE IMPAIRMENTS: decreased activity tolerance, decreased ROM, increased muscle spasms, postural dysfunction, and pain.   ACTIVITY LIMITATIONS: carrying, lifting, and reach over head  PARTICIPATION LIMITATIONS: occupation  PERSONAL FACTORS: Time since onset of injury/illness/exacerbation are also affecting patient's functional outcome.   REHAB POTENTIAL: Excellent  CLINICAL DECISION MAKING: Stable/uncomplicated  EVALUATION COMPLEXITY: Low   GOALS:  SHORT TERM GOALS: Target date: 09/28/22.  Ind with a HEP.  Goal status: MET   LONG TERM GOALS: Target date: 10/26/22  Increase active cervical rotation to 70 degrees+ so patient can turn head more easily  while driving.  Goal status:  MET 70 degrees today  2.  Eliminate UE symptoms.  Goal status: ON going    50% less  3.  Perform ADL's with pain not > 2-3/10.  Goal status: On going   PLAN:  PT FREQUENCY: 2x/week  PT DURATION: 6 weeks  PLANNED INTERVENTIONS: Therapeutic exercises, Therapeutic activity, Patient/Family education, Self Care, Dry Needling, Electrical stimulation, Cryotherapy, Moist heat, Traction, Ultrasound, and Manual therapy  PLAN FOR NEXT SESSION:  Possible DC and discuss home traction unit   Shulem Mader, [name], PT 11/03/2022, 9:44 AM

## 2022-11-03 NOTE — Addendum Note (Signed)
Addended by: Mala Gibbard, [name] W on: 11/03/2022 09:55 AM   Modules accepted: Orders

## 2022-11-06 ENCOUNTER — Ambulatory Visit: Payer: Medicare Other | Admitting: Physical Therapy

## 2022-11-06 DIAGNOSIS — M62838 Other muscle spasm: Secondary | ICD-10-CM | POA: Diagnosis not present

## 2022-11-06 DIAGNOSIS — M542 Cervicalgia: Secondary | ICD-10-CM

## 2022-11-06 NOTE — Therapy (Signed)
OUTPATIENT PHYSICAL THERAPY CERVICAL TREATMENT   Patient Name: Arthur Foster MRN: [id number] DOB:1950-06-20, 72 y.o., male Today's Date: 11/06/2022  END OF SESSION:  PT End of Session - 11/06/22 1513     Visit Number 13    Number of Visits 14    Date for PT Re-Evaluation 11/09/22    PT Start Time 0228    PT Stop Time 0319    PT Time Calculation (min) 51 min    Activity Tolerance Patient tolerated treatment well    Behavior During Therapy [facility] for tasks assessed/performed             Past Medical History:  Diagnosis Date   Anxiety    BPH (benign prostatic hyperplasia)    Depression    Hyperlipidemia    Hypertension    Testosterone deficiency    Past Surgical History:  Procedure Laterality Date   FOOT SURGERY     HERNIA REPAIR Left inguinal   Patient Active Problem List   Diagnosis Date Noted   Prediabetes 05/11/2022   Preop examination 01/11/2021   Back pain 06/24/2020   Dysuria 06/24/2020   Tendonitis, Achilles, right 05/27/2019   Controlled substance agreement signed 11/22/2018   Blood glucose elevated 11/22/2018   Vitamin D deficiency 11/22/2018   Sensorineural hearing loss (SNHL), bilateral 06/17/2018   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  THERAPY DIAG:  Cervicalgia  Other muscle spasm  Rationale for Evaluation and Treatment: Rehabilitation  ONSET DATE:  November 2023.  SUBJECTIVE:                                                                                                                                                                                                         SUBJECTIVE STATEMENT: Doing okay right now but pain woke me up at about 2 am this morning.  PERTINENT HISTORY:  HTN.  PAIN:  Are you having pain? Yes: NPRS scale: 2/10 Pain location: Left cervical. Pain description: Numbness, pins and needles. Aggravating factors: Increased use of left UE. Relieving  factors: Rest.  PRECAUTIONS: None  WEIGHT BEARING RESTRICTIONS: No  FALLS:  Has patient fallen in last 6 months? No  LIVING ENVIRONMENT: Lives with: lives with their spouse Lives in: House/apartment Has following equipment at home: None  OCCUPATION: Personnel officer   PLOF: Independent  PATIENT GOALS: Not have pain and symptoms into hand.   OBJECTIVE:   DIAGNOSTIC FINDINGS:  09/05/22:  Multilevel degenerative disc disease most prominent at C4-C5 and C5-C6.  PATIENT SURVEYS:  FOTO.  POSTURE: rounded shoulders and forward head, loss of lordosis.  PALPATION: Tender with active trigger point over left UT.   CERVICAL ROM:  Bilateral active cervical rotation is 60 degrees.  Extension is 40 degrees and bilateral side bending is 15 degrees.  UPPER EXTREMITY ROM:  WNL.  UPPER EXTREMITY MMT:  Normal UE strength.  CERVICAL SPECIAL TESTS:  Normal UE DTR's.   Some pain reproduction with left shoulder impingement testing.   TODAY'S TREATMENT:                                                                                                                              DATE:                                                                 11/06/22  Trigger Point Dry-Needling  Treatment instructions: Expect mild to moderate muscle soreness. S/S of pneumothorax if dry needled over a lung field, and to seek immediate medical attention should they occur.  Patient verbalized understanding of these instructions and education.  Patient Consent Given: Yes Education handout provided: Yes Muscles treated: Left UT.   Combo e'stim/US at 1.50 w/CM2 x12 minutes TO LT UT   STW/M x 11 minutes to patient's left UT region to reduce tone HMP and IFC at 80-150 Hz on 40% scan x 15 minutes.     Normal modality response following removal of modality.    PATIENT EDUCATION:  Education details: Discussed dry needling and consent form provided to patient. Person educated: Patient Education method: handout/consent form. Education comprehension:   HOME EXERCISE PROGRAM: As above.  ASSESSMENT:  CLINICAL IMPRESSION: Patient tolerated dry needling to her left UT without complaint.  No pain reported after treatment.    OBJECTIVE IMPAIRMENTS: decreased activity tolerance, decreased ROM, increased muscle spasms, postural dysfunction, and pain.   ACTIVITY LIMITATIONS: carrying, lifting, and reach over head  PARTICIPATION LIMITATIONS: occupation  PERSONAL FACTORS: Time since onset of injury/illness/exacerbation are also affecting patient's functional outcome.   REHAB POTENTIAL: Excellent  CLINICAL DECISION MAKING: Stable/uncomplicated  EVALUATION COMPLEXITY: Low   GOALS:  SHORT TERM GOALS: Target date: 09/28/22.  Ind with a HEP.  Goal status: MET   LONG TERM GOALS: Target date: 10/26/22  Increase active cervical rotation  to 70 degrees+ so patient can turn head more easily while driving.  Goal status:  MET 70 degrees today  2.  Eliminate UE symptoms.  Goal status: ON going    50% less  3.  Perform ADL's with pain not > 2-3/10.  Goal status: On going   PLAN:  PT FREQUENCY: 2x/week  PT DURATION: 6 weeks  PLANNED INTERVENTIONS: Therapeutic exercises, Therapeutic activity, Patient/Family education, Self Care, Dry Needling, Electrical stimulation, Cryotherapy, Moist heat, Traction, Ultrasound, and Manual therapy  PLAN FOR NEXT SESSION:   Possible DC and discuss home traction unit   [name], [name], PT 11/06/2022, 3:22 PM

## 2022-11-09 ENCOUNTER — Ambulatory Visit: Payer: Medicare Other | Admitting: Physical Therapy

## 2022-11-09 DIAGNOSIS — M542 Cervicalgia: Secondary | ICD-10-CM | POA: Diagnosis not present

## 2022-11-09 DIAGNOSIS — M62838 Other muscle spasm: Secondary | ICD-10-CM | POA: Diagnosis not present

## 2022-11-09 NOTE — Therapy (Signed)
OUTPATIENT PHYSICAL THERAPY CERVICAL TREATMENT   Patient Name: Arthur Foster MRN: [id number] DOB:February 21, 1951, 72 y.o., male Today's Date: 11/09/2022  END OF SESSION:  PT End of Session - 11/09/22 1006     Visit Number 14    Number of Visits 14    Date for PT Re-Evaluation 11/09/22    PT Start Time 0845    PT Stop Time 0941    PT Time Calculation (min) 56 min    Activity Tolerance Patient tolerated treatment well    Behavior During Therapy WFL for tasks assessed/performed             Past Medical History:  Diagnosis Date   Anxiety    BPH (benign prostatic hyperplasia)    Depression    Hyperlipidemia    Hypertension    Testosterone deficiency    Past Surgical History:  Procedure Laterality Date   FOOT SURGERY     HERNIA REPAIR Left inguinal   Patient Active Problem List   Diagnosis Date Noted   Prediabetes 05/11/2022   Preop examination 01/11/2021   Back pain 06/24/2020   Dysuria 06/24/2020   Tendonitis, Achilles, right 05/27/2019   Controlled substance agreement signed 11/22/2018   Blood glucose elevated 11/22/2018   Vitamin D deficiency 11/22/2018   Sensorineural hearing loss (SNHL), bilateral 06/17/2018   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  THERAPY DIAG:  Cervicalgia  Other muscle spasm  Rationale for Evaluation and Treatment: Rehabilitation  ONSET DATE:  November 2023.  SUBJECTIVE:                                                                                                                                                                                                         SUBJECTIVE STATEMENT: Temporary relief from dry needling.  PERTINENT HISTORY:  HTN.  PAIN:  Are you having pain? Yes: NPRS scale: 2/10 Pain location: Left cervical. Pain description: Numbness, pins and needles. Aggravating factors: Increased use of left UE. Relieving factors: Rest.  PRECAUTIONS: None  WEIGHT BEARING RESTRICTIONS: No  FALLS:  Has patient fallen in last 6 months? No  LIVING ENVIRONMENT: Lives with: lives with their spouse Lives in: House/apartment Has following equipment at home: None  OCCUPATION: Personnel officer   PLOF: Independent  PATIENT GOALS: Not have pain and symptoms into hand.   OBJECTIVE:   DIAGNOSTIC FINDINGS:  09/05/22:  Multilevel degenerative disc disease most prominent at C4-C5 and C5-C6.  PATIENT SURVEYS:  FOTO.  POSTURE: rounded shoulders and forward head, loss of lordosis.  PALPATION: Tender with active trigger point over left UT.   CERVICAL ROM:  Bilateral active cervical rotation is 60 degrees.  Extension is 40 degrees and bilateral side bending is 15 degrees.  UPPER EXTREMITY ROM:  WNL.  UPPER EXTREMITY MMT:  Normal UE strength.  CERVICAL SPECIAL TESTS:  Normal UE DTR's.   Some pain reproduction with left shoulder impingement testing.   TODAY'S TREATMENT:                                                                                                                              DATE:                                                                 11/09/22  Trigger Point Dry-Needling  Treatment instructions: Expect mild to moderate muscle soreness. S/S of pneumothorax if dry needled over a lung field, and to seek immediate medical attention should they occur. Patient verbalized understanding of  these instructions and education.  Patient Consent Given: Yes Education handout provided: Yes Muscles treated: Left UT and left splenius capitus/cervicis   Combo e'stim/US at 1.50 w/CM2 x12 minutes TO LT UT   STW/M x 11 minutes to patient's left UT region to reduce tone f/b intermittent traction at 25# x 15 minutes (99 sec on and 5 sec rest).      PATIENT EDUCATION:  Education details: Discussed dry needling and consent form provided to patient. Person educated: Patient Education method: handout/consent form. Education comprehension:   HOME EXERCISE PROGRAM: As above.  ASSESSMENT:  CLINICAL IMPRESSION: Patient tolerated dry needling to her left UT and splenius capitus/cervicis without complaint.  No pain reported after treatment but treatments have provided him with just temporary relief.  He will call clinic on Monday to let us know how he is doing.  If only temporary relief will discharge.    OBJECTIVE IMPAIRMENTS: decreased activity tolerance, decreased ROM, increased muscle spasms, postural dysfunction, and pain.   ACTIVITY LIMITATIONS: carrying, lifting, and reach over head  PARTICIPATION LIMITATIONS: occupation  PERSONAL FACTORS: Time since onset of injury/illness/exacerbation are also affecting patient's functional outcome.   REHAB POTENTIAL: Excellent  CLINICAL DECISION MAKING: Stable/uncomplicated  EVALUATION COMPLEXITY: Low   GOALS:  SHORT TERM GOALS: Target date: 09/28/22.  Ind with a HEP.  Goal status: MET   LONG TERM GOALS: Target date: 10/26/22  Increase active cervical rotation to 70 degrees+ so patient can turn head more easily while driving.  Goal status:  MET 70 degrees today  2.  Eliminate UE symptoms.  Goal status: ON going    50% less  3.  Perform ADL's with pain not > 2-3/10.  Goal status: On going   PLAN:  PT FREQUENCY: 2x/week  PT DURATION: 6 weeks  PLANNED INTERVENTIONS: Therapeutic exercises, Therapeutic activity,  Patient/Family education, Self Care, Dry Needling, Electrical stimulation, Cryotherapy, Moist heat, Traction, Ultrasound, and Manual therapy  PLAN FOR NEXT SESSION:  Possible DC and discuss home traction unit   Ofelia Podolski, [name], PT 11/09/2022, 10:14 AM

## 2022-11-10 ENCOUNTER — Ambulatory Visit: Payer: Medicare Other | Admitting: Family Medicine

## 2022-11-10 ENCOUNTER — Encounter: Payer: Self-pay | Admitting: Family Medicine

## 2022-11-10 VITALS — BP 137/87 | HR 69 | Temp 97.7°F | Resp 20 | Ht 73.0 in | Wt 308.4 lb

## 2022-11-10 DIAGNOSIS — R7303 Prediabetes: Secondary | ICD-10-CM

## 2022-11-10 DIAGNOSIS — E559 Vitamin D deficiency, unspecified: Secondary | ICD-10-CM

## 2022-11-10 DIAGNOSIS — F339 Major depressive disorder, recurrent, unspecified: Secondary | ICD-10-CM

## 2022-11-10 DIAGNOSIS — E782 Mixed hyperlipidemia: Secondary | ICD-10-CM

## 2022-11-10 DIAGNOSIS — I10 Essential (primary) hypertension: Secondary | ICD-10-CM | POA: Diagnosis not present

## 2022-11-10 DIAGNOSIS — Z6841 Body Mass Index (BMI) 40.0 and over, adult: Secondary | ICD-10-CM

## 2022-11-10 DIAGNOSIS — F411 Generalized anxiety disorder: Secondary | ICD-10-CM

## 2022-11-10 DIAGNOSIS — Z79899 Other long term (current) drug therapy: Secondary | ICD-10-CM | POA: Diagnosis not present

## 2022-11-10 LAB — CMP14+EGFR

## 2022-11-10 LAB — THYROID PANEL WITH TSH

## 2022-11-10 LAB — CBC WITH DIFFERENTIAL/PLATELET
Basophils Absolute: 0.1 10*3/uL (ref 0.0–0.2)
Basos: 1 %
EOS (ABSOLUTE): 0.3 10*3/uL (ref 0.0–0.4)
Eos: 5 %
Hematocrit: 46.5 % (ref 37.5–51.0)
Hemoglobin: 15.3 g/dL (ref 13.0–17.7)
Immature Grans (Abs): 0.1 10*3/uL (ref 0.0–0.1)
Immature Granulocytes: 1 %
Lymphocytes Absolute: 2 10*3/uL (ref 0.7–3.1)
Lymphs: 30 %
MCH: 30 pg (ref 26.6–33.0)
MCHC: 32.9 g/dL (ref 31.5–35.7)
MCV: 91 fL (ref 79–97)
Monocytes Absolute: 0.5 10*3/uL (ref 0.1–0.9)
Monocytes: 8 %
Neutrophils Absolute: 3.8 10*3/uL (ref 1.4–7.0)
Neutrophils: 55 %
Platelets: 339 10*3/uL (ref 150–450)
RBC: 5.1 x10E6/uL (ref 4.14–5.80)
RDW: 11.9 % (ref 11.6–15.4)
WBC: 6.8 10*3/uL (ref 3.4–10.8)

## 2022-11-10 LAB — LIPID PANEL

## 2022-11-10 LAB — VITAMIN D 25 HYDROXY (VIT D DEFICIENCY, FRACTURES)

## 2022-11-10 LAB — BAYER DCA HB A1C WAIVED: HB A1C (BAYER DCA - WAIVED): 5.8 % — ABNORMAL HIGH (ref 4.8–5.6)

## 2022-11-10 MED ORDER — ALPRAZOLAM 0.25 MG PO TABS
ORAL_TABLET | ORAL | 2 refills | Status: DC
Start: 2022-11-10 — End: 2023-04-24

## 2022-11-10 NOTE — Progress Notes (Signed)
Subjective:  Patient ID: Arthur Foster, male    DOB: [birth year], 72 y.o.   MRN: [id number]  Patient Care Team: Sonny Masters, FNP as PCP - General (Family Medicine)   Chief Complaint:  Medical Management of Chronic Issues   HPI: Arthur Foster is a 72 y.o. male presenting on 11/10/2022 for Medical Management of Chronic Issues    1. Essential hypertension, benign Complaint with meds - Yes Current Medications - lisinopril  Checking BP at home - no Exercising Regularly - No Watching Salt intake - Yes Pertinent ROS:  Headache - No Fatigue - No Visual Disturbances - No Chest pain - No Dyspnea - No Palpitations - No LE edema - No They report good compliance with medications and can restate their regimen by memory. No medication side effects.  BP Readings from Last 3 Encounters:  11/10/22 137/87  09/05/22 119/74  06/29/22 138/85    2. Mixed dyslipidemia Currently not on medications. Tries to watch his diet but does like pork.   3. Morbid obesity (HCC) Tries to watch his diet, does not follow a regular exercise routine.   4. Vitamin D deficiency Pt is taking oral repletion therapy. Denies bone pain and tenderness, muscle weakness, fracture, and difficulty walking. Lab Results  Component Value Date   VD25OH 38.7 05/11/2022   VD25OH 31.6 11/24/2019   VD25OH 36.9 05/26/2019   Lab Results  Component Value Date   CALCIUM 9.3 05/11/2022     5. Depression, recurrent (HCC) 6. Generalized anxiety disorder Does very well on Lexapro and as needed Xanax. Denies SI or HI. No adverse side effects.     11/10/2022    8:37 AM 09/05/2022    9:07 AM 06/29/2022    8:17 AM 05/11/2022    8:43 AM 04/28/2022    9:18 AM  Depression screen PHQ 2/9  Decreased Interest 0 0 0 0 0  Down, Depressed, Hopeless 0 0 0 0 0  PHQ - 2 Score 0 0 0 0 0  Altered sleeping 0 0 0 0   Tired, decreased energy 0 0 0 0   Change in appetite 0 0 0 0   Feeling bad or failure about yourself  0 0 0 0   Trouble  concentrating 0 0 0 0   Moving slowly or fidgety/restless 0 0 0 0   Suicidal thoughts 0 0 0 0   PHQ-9 Score 0 0 0 0   Difficult doing work/chores  Not difficult at all Not difficult at all Not difficult at all       11/10/2022    8:37 AM 09/05/2022    9:07 AM 06/29/2022    8:18 AM 05/11/2022    8:43 AM  GAD 7 : Generalized Anxiety Score  Nervous, Anxious, on Edge 0 0 0 0  Control/stop worrying 0 0 0 0  Worry too much - different things 0 0 0 0  Trouble relaxing 0 0 0 0  Restless 0 0 0 0  Easily annoyed or irritable 0 0 0 0  Afraid - awful might happen 0 0 0 0  Total GAD 7 Score 0 0 0 0  Anxiety Difficulty  Not difficult at all Not difficult at all Not difficult at all    8. Prediabetes Not on medications. No reported polydipsia, polyuria, or polyphagia.    Relevant past medical, surgical, family, and social history reviewed and updated as indicated.  Allergies and medications reviewed and updated. Data reviewed: Chart in Epic.  Past Medical History:  Diagnosis Date   Anxiety    BPH (benign prostatic hyperplasia)    Depression    Hyperlipidemia    Hypertension    Testosterone deficiency     Past Surgical History:  Procedure Laterality Date   FOOT SURGERY     HERNIA REPAIR Left inguinal    Social History   Socioeconomic History   Marital status: Married    Spouse name: Arthur Foster   Number of children: 3   Years of education: 12   Highest education level: High school graduate  Occupational History   Occupation: retired    Comment: works a few hours a week  Tobacco Use   Smoking status: Former    Current packs/day: 0.00    Types: Cigarettes    Quit date: 1998    Years since quitting: 26.6   Smokeless tobacco: Former    Types: Chew    Quit date: 2002  Vaping Use   Vaping status: Never Used  Substance and Sexual Activity   Alcohol use: Yes    Alcohol/week: 14.0 standard drinks of alcohol    Types: 14 Cans of beer per week   Drug use: No   Sexual activity:  Yes    Birth control/protection: None  Other Topics Concern   Not on file  Social History Narrative   3 daughters, 2 live in [place]. 6 grandchildren.   Was married x 33 years prior to first wife's death.   Remarried to Arthur Foster.   Social Determinants of Health   Financial Resource Strain: Low Risk  (04/28/2022)   Overall Financial Resource Strain (CARDIA)    Difficulty of Paying Living Expenses: Not hard at all  Food Insecurity: No Food Insecurity (04/28/2022)   Hunger Vital Sign    Worried About Running Out of Food in the Last Year: Never true    Ran Out of Food in the Last Year: Never true  Transportation Needs: No Transportation Needs (04/28/2022)   PRAPARE - Administrator, Civil Service (Medical): No    Lack of Transportation (Non-Medical): No  Physical Activity: Sufficiently Active (04/28/2022)   Exercise Vital Sign    Days of Exercise per Week: 5 days    Minutes of Exercise per Session: 30 min  Stress: No Stress Concern Present (04/28/2022)   [employer] of Occupational Health - Occupational Stress Questionnaire    Feeling of Stress : Not at all  Social Connections: Moderately Isolated (04/28/2022)   Social Connection and Isolation Panel [NHANES]    Frequency of Communication with Friends and Family: More than three times a week    Frequency of Social Gatherings with Friends and Family: More than three times a week    Attends Religious Services: Never    Database administrator or Organizations: No    Attends Banker Meetings: Never    Marital Status: Married  Catering manager Violence: Not At Risk (04/28/2022)   Humiliation, Afraid, Rape, and Kick questionnaire    Fear of Current or Ex-Partner: No    Emotionally Abused: No    Physically Abused: No    Sexually Abused: No    Outpatient Encounter Medications as of 11/10/2022  Medication Sig   ALPRAZolam (XANAX) 0.25 MG tablet Take 1 to 2 tablets as needed   cetirizine (ZYRTEC) 10 MG tablet Take  10 mg by mouth daily.   Cholecalciferol 1.25 MG (50000 UT) capsule Take by mouth.   escitalopram (LEXAPRO) 20 MG tablet Take 1 tablet (  20 mg total) by mouth daily.   lisinopril (ZESTRIL) 20 MG tablet Take 1 tablet (20 mg total) by mouth daily.   [DISCONTINUED] ALPRAZolam (XANAX) 0.25 MG tablet Take 1 to 2 tablets as needed   [DISCONTINUED] ibuprofen (ADVIL) 600 MG tablet Take 1 tablet (600 mg total) by mouth every 6 (six) hours as needed. (Patient not taking: Reported on 06/29/2022)   No facility-administered encounter medications on file as of 11/10/2022.    Allergies  Allergen Reactions   Bee Venom    Other Other (See Comments)   Short Ragweed Pollen Ext Other (See Comments)   Zolpidem Other (See Comments)   Ambien [Zolpidem Tartrate] Other (See Comments)    Review of Systems  Constitutional:  Negative for activity change, appetite change, chills, diaphoresis, fatigue, fever and unexpected weight change.  HENT: Negative.    Eyes: Negative.  Negative for photophobia and visual disturbance.  Respiratory:  Negative for cough, chest tightness and shortness of breath.   Cardiovascular:  Negative for chest pain, palpitations and leg swelling.  Gastrointestinal:  Negative for abdominal pain, blood in stool, constipation, diarrhea, nausea and vomiting.  Endocrine: Negative.  Negative for cold intolerance, heat intolerance, polydipsia, polyphagia and polyuria.  Genitourinary:  Negative for decreased urine volume, difficulty urinating, dysuria, frequency and urgency.  Musculoskeletal:  Negative for arthralgias and myalgias.  Skin: Negative.   Allergic/Immunologic: Negative.   Neurological:  Negative for dizziness, tremors, seizures, syncope, speech difficulty, weakness, light-headedness, numbness and headaches.  Hematological: Negative.   Psychiatric/Behavioral:  Negative for confusion, hallucinations, sleep disturbance and suicidal ideas.   All other systems reviewed and are negative.        Objective:  BP 137/87   Pulse 69   Temp 97.7 F (36.5 C) (Oral)   Resp 20   Ht 6\' 1"  (1.854 m)   Wt (!) 308 lb 6 oz (139.9 kg)   SpO2 95%   BMI 40.69 kg/m    Wt Readings from Last 3 Encounters:  11/10/22 (!) 308 lb 6 oz (139.9 kg)  09/05/22 (!) 309 lb (140.2 kg)  06/29/22 (!) 306 lb (138.8 kg)    Physical Exam Vitals and nursing note reviewed.  Constitutional:      General: He is not in acute distress.    Appearance: Normal appearance. He is well-developed and well-groomed. He is morbidly obese. He is not ill-appearing, toxic-appearing or diaphoretic.  HENT:     Head: Normocephalic and atraumatic.     Jaw: There is normal jaw occlusion.     Right Ear: Hearing normal.     Left Ear: Hearing normal.     Nose: Nose normal.     Mouth/Throat:     Lips: Pink.     Mouth: Mucous membranes are moist.     Pharynx: Oropharynx is clear. Uvula midline.  Eyes:     General: Lids are normal.     Extraocular Movements: Extraocular movements intact.     Conjunctiva/sclera: Conjunctivae normal.     Pupils: Pupils are equal, round, and reactive to light.  Neck:     Thyroid: No thyroid mass, thyromegaly or thyroid tenderness.     Vascular: No carotid bruit or JVD.     Trachea: Trachea and phonation normal.  Cardiovascular:     Rate and Rhythm: Normal rate and regular rhythm.     Chest Wall: PMI is not displaced.     Pulses: Normal pulses.     Heart sounds: Normal heart sounds. No murmur heard.  No friction rub. No gallop.  Pulmonary:     Effort: Pulmonary effort is normal. No respiratory distress.     Breath sounds: Normal breath sounds. No wheezing.  Abdominal:     General: Bowel sounds are normal. There is no distension or abdominal bruit.     Palpations: Abdomen is soft. There is no hepatomegaly or splenomegaly.     Tenderness: There is no abdominal tenderness. There is no right CVA tenderness or left CVA tenderness.     Hernia: No hernia is present.  Musculoskeletal:         General: Normal range of motion.     Cervical back: Normal range of motion and neck supple.     Right lower leg: No edema.     Left lower leg: No edema.  Lymphadenopathy:     Cervical: No cervical adenopathy.  Skin:    General: Skin is warm and dry.     Capillary Refill: Capillary refill takes less than 2 seconds.     Coloration: Skin is not cyanotic, jaundiced or pale.     Findings: No rash.  Neurological:     General: No focal deficit present.     Mental Status: He is alert and oriented to person, place, and time.     Sensory: Sensation is intact.     Motor: Motor function is intact.     Coordination: Coordination is intact.     Gait: Gait is intact.     Deep Tendon Reflexes: Reflexes are normal and symmetric.  Psychiatric:        Attention and Perception: Attention and perception normal.        Mood and Affect: Mood and affect normal.        Speech: Speech normal.        Behavior: Behavior normal. Behavior is cooperative.        Thought Content: Thought content normal.        Cognition and Memory: Cognition and memory normal.        Judgment: Judgment normal.     Results for orders placed or performed in visit on 05/11/22  CMP14+EGFR  Result Value Ref Range   Glucose 130 (H) 70 - 99 mg/dL   BUN 17 8 - 27 mg/dL   Creatinine, Ser 2.95 0.76 - 1.27 mg/dL   eGFR 83 >28 UX/LKG/4.01   BUN/Creatinine Ratio 17 10 - 24   Sodium 142 134 - 144 mmol/L   Potassium 4.4 3.5 - 5.2 mmol/L   Chloride 105 96 - 106 mmol/L   CO2 20 20 - 29 mmol/L   Calcium 9.3 8.6 - 10.2 mg/dL   Total Protein 6.7 6.0 - 8.5 g/dL   Albumin 4.4 3.9 - 4.9 g/dL   Globulin, Total 2.3 1.5 - 4.5 g/dL   Albumin/Globulin Ratio 1.9 1.2 - 2.2   Bilirubin Total 0.7 0.0 - 1.2 mg/dL   Alkaline Phosphatase 70 44 - 121 IU/L   AST 20 0 - 40 IU/L   ALT 23 0 - 44 IU/L  CBC with Differential/Platelet  Result Value Ref Range   WBC 6.6 3.4 - 10.8 x10E3/uL   RBC 5.08 4.14 - 5.80 x10E6/uL   Hemoglobin 15.2 13.0 - 17.7  g/dL   Hematocrit 02.7 25.3 - 51.0 %   MCV 89 79 - 97 fL   MCH 29.9 26.6 - 33.0 pg   MCHC 33.5 31.5 - 35.7 g/dL   RDW 66.4 40.3 - 47.4 %   Platelets 354 150 -  450 x10E3/uL   Neutrophils 58 Not Estab. %   Lymphs 28 Not Estab. %   Monocytes 8 Not Estab. %   Eos 4 Not Estab. %   Basos 1 Not Estab. %   Neutrophils Absolute 3.9 1.4 - 7.0 x10E3/uL   Lymphocytes Absolute 1.9 0.7 - 3.1 x10E3/uL   Monocytes Absolute 0.5 0.1 - 0.9 x10E3/uL   EOS (ABSOLUTE) 0.3 0.0 - 0.4 x10E3/uL   Basophils Absolute 0.1 0.0 - 0.2 x10E3/uL   Immature Granulocytes 1 Not Estab. %   Immature Grans (Abs) 0.0 0.0 - 0.1 x10E3/uL  Lipid panel  Result Value Ref Range   Cholesterol, Total 198 100 - 199 mg/dL   Triglycerides 161 (H) 0 - 149 mg/dL   HDL 45 >09 mg/dL   VLDL Cholesterol Cal 28 5 - 40 mg/dL   LDL Chol Calc (NIH) 604 (H) 0 - 99 mg/dL   Chol/HDL Ratio 4.4 0.0 - 5.0 ratio  Thyroid Panel With TSH  Result Value Ref Range   TSH 2.230 0.450 - 4.500 uIU/mL   T4, Total 6.3 4.5 - 12.0 ug/dL   T3 Uptake Ratio 27 24 - 39 %   Free Thyroxine Index 1.7 1.2 - 4.9  VITAMIN D 25 Hydroxy (Vit-D Deficiency, Fractures)  Result Value Ref Range   Vit D, 25-Hydroxy 38.7 30.0 - 100.0 ng/mL  Bayer DCA Hb A1c Waived  Result Value Ref Range   HB A1C (BAYER DCA - WAIVED) 6.0 (H) 4.8 - 5.6 %  PSA, total and free  Result Value Ref Range   Prostate Specific Ag, Serum 1.0 0.0 - 4.0 ng/mL   PSA, Free 0.20 N/A ng/mL   PSA, Free Pct 20.0 %  Drug Screen 10 W/Conf, Se  Result Value Ref Range   Amphetamines, IA Negative Cutoff:50 ng/mL   Barbiturates, IA Negative Cutoff:0.1 ug/mL   Benzodiazepines, IA Negative Cutoff:20 ng/mL   Cocaine & Metabolite, IA Negative Cutoff:25 ng/mL   Phencyclidine, IA Negative Cutoff:8 ng/mL   THC(Marijuana) Metabolite, IA Negative Cutoff:5 ng/mL   Opiates, IA Negative Cutoff:5 ng/mL   Oxycodones, IA Negative Cutoff:5 ng/mL   Methadone, IA Negative Cutoff:25 ng/mL   Propoxyphene, IA Negative  Cutoff:50 ng/mL       Pertinent labs & imaging results that were available during my care of the patient were reviewed by me and considered in my medical decision making.  Assessment & Plan:  Isiaha Ling" was seen today for medical management of chronic issues.  Diagnoses and all orders for this visit:  Essential hypertension, benign BP well controlled. Changes were not made in regimen today. Goal BP is 130/80. Pt aware to report any persistent high or low readings. DASH diet and exercise encouraged. Exercise at least 150 minutes per week and increase as tolerated. Goal BMI > 25. Stress management encouraged. Avoid nicotine and tobacco product use. Avoid excessive alcohol and NSAID's. Avoid more than 2000 mg of sodium daily. Medications as prescribed. Follow up as scheduled.  -     CBC with Differential/Platelet -     CMP14+EGFR -     Lipid panel -     Thyroid Panel With TSH  Mixed dyslipidemia Labs pending, diet and exercise encouraged. Will discuss starting medications if warranted.  -     CMP14+EGFR -     Lipid panel  Morbid obesity (HCC) Diet and exercise encouraged. Labs pending.  -     CBC with Differential/Platelet -     CMP14+EGFR -     Lipid  panel -     Thyroid Panel With TSH -     Bayer DCA Hb A1c Waived  Vitamin D deficiency Labs pending. Continue repletion therapy. If indicated, will change repletion dosage. Eat foods rich in Vit D including milk, orange juice, yogurt with vitamin D added, salmon or mackerel, canned tuna fish, cereals with vitamin D added, and cod liver oil. Get out in the sun but make sure to wear at least SPF 30 sunscreen.  -     CMP14+EGFR -     VITAMIN D 25 Hydroxy (Vit-D Deficiency, Fractures)  Depression, recurrent (HCC) Generalized anxiety disorder Controlled substance agreement signed Does well on Lexapro and Xanax, will continue.  -     ALPRAZolam (XANAX) 0.25 MG tablet; Take 1 to 2 tablets as needed -     Thyroid Panel With  TSH  Prediabetes Labs pending, diet and exercise encouraged.  -     CMP14+EGFR -     Bayer DCA Hb A1c Waived     Continue all other maintenance medications.  Follow up plan: Return in about 6 months (around 05/13/2023), or if symptoms worsen or fail to improve, for CPE.   Continue healthy lifestyle choices, including diet (rich in fruits, vegetables, and lean proteins, and low in salt and simple carbohydrates) and exercise (at least 30 minutes of moderate physical activity daily).  Educational handout given for health maintenance   The above assessment and management plan was discussed with the patient. The patient verbalized understanding of and has agreed to the management plan. Patient is aware to call the clinic if they develop any new symptoms or if symptoms persist or worsen. Patient is aware when to return to the clinic for a follow-up visit. Patient educated on when it is appropriate to go to the emergency department.   Kari Baars, FNP-C [facility] [phone]

## 2022-11-13 DIAGNOSIS — Z79899 Other long term (current) drug therapy: Secondary | ICD-10-CM | POA: Diagnosis not present

## 2022-11-15 ENCOUNTER — Telehealth: Payer: Self-pay | Admitting: Family Medicine

## 2022-11-15 DIAGNOSIS — M5137 Other intervertebral disc degeneration, lumbosacral region: Secondary | ICD-10-CM

## 2022-11-15 NOTE — Telephone Encounter (Signed)
He can try aleve twice daily with food, no other NSAIDs, for 2 weeks. If this does not work, will refer to ortho.

## 2022-11-15 NOTE — Telephone Encounter (Signed)
Left message for pt to return call.

## 2022-11-15 NOTE — Telephone Encounter (Signed)
Patient said he has degenerative disc that he has discussed with PCP, tried dry needling to help but it only lasted around 3 days. Wants to know if he should take ibuprofen or if PCP thinks he needs to be referred. Please call back and advise.

## 2022-11-20 NOTE — Telephone Encounter (Signed)
Patient aware and verbalizes understanding. 

## 2022-12-01 NOTE — Telephone Encounter (Signed)
Patient aware and verbalizes understanding.    Referral also pended for ortho.  Does not have specific location to be sent to.  Aware he will be contacted about about referral.

## 2022-12-01 NOTE — Telephone Encounter (Signed)
Pt called to let PCP know that he did what she said and took 2 aleve twice daily for 2 weeks and says that did help. After the 2 weeks were up, he stopped taking the aleve and was fine for about 2 days and then the pain came back again. Needs advise.

## 2022-12-01 NOTE — Addendum Note (Signed)
Addended by: Angela Adam on: 12/01/2022 04:19 PM   Modules accepted: Orders

## 2022-12-01 NOTE — Telephone Encounter (Signed)
He can take 1 aleve twice daily with food. If this does not help, please let me know.

## 2022-12-13 IMAGING — DX DG KNEE 1-2V*L*
2 series · 2 of 2 positions shown · non-contrast
Comparison: Radiograph 12/13/2016

CLINICAL DATA: Left knee pain.

EXAM:
LEFT KNEE - 1-2 VIEW

[knee ap]
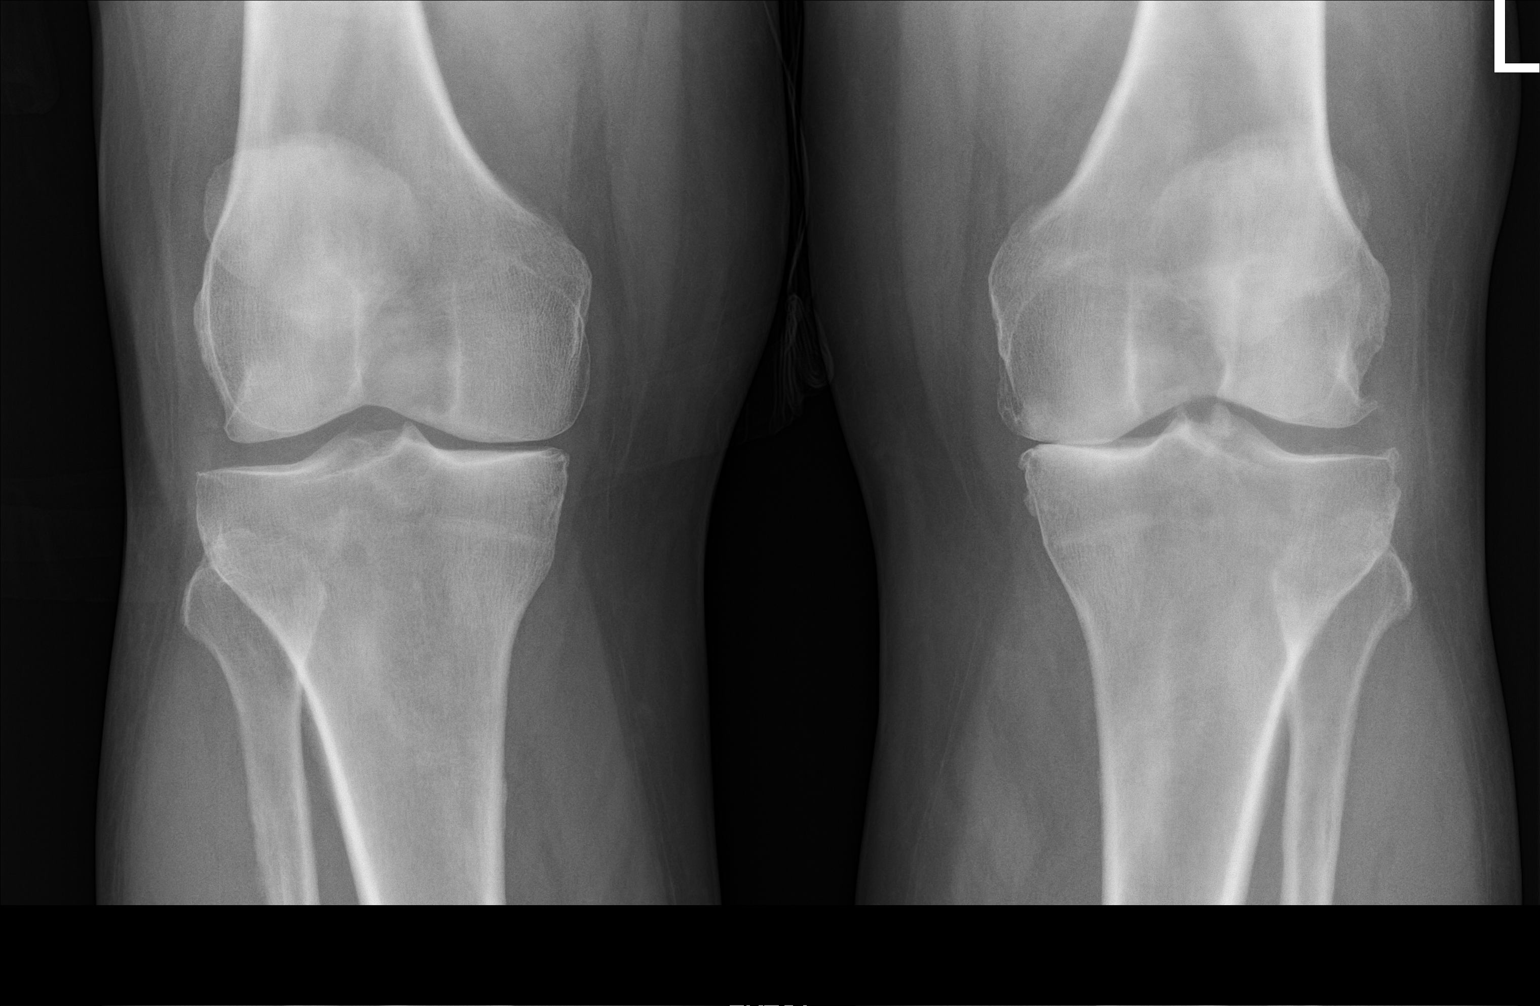

[knee lat]
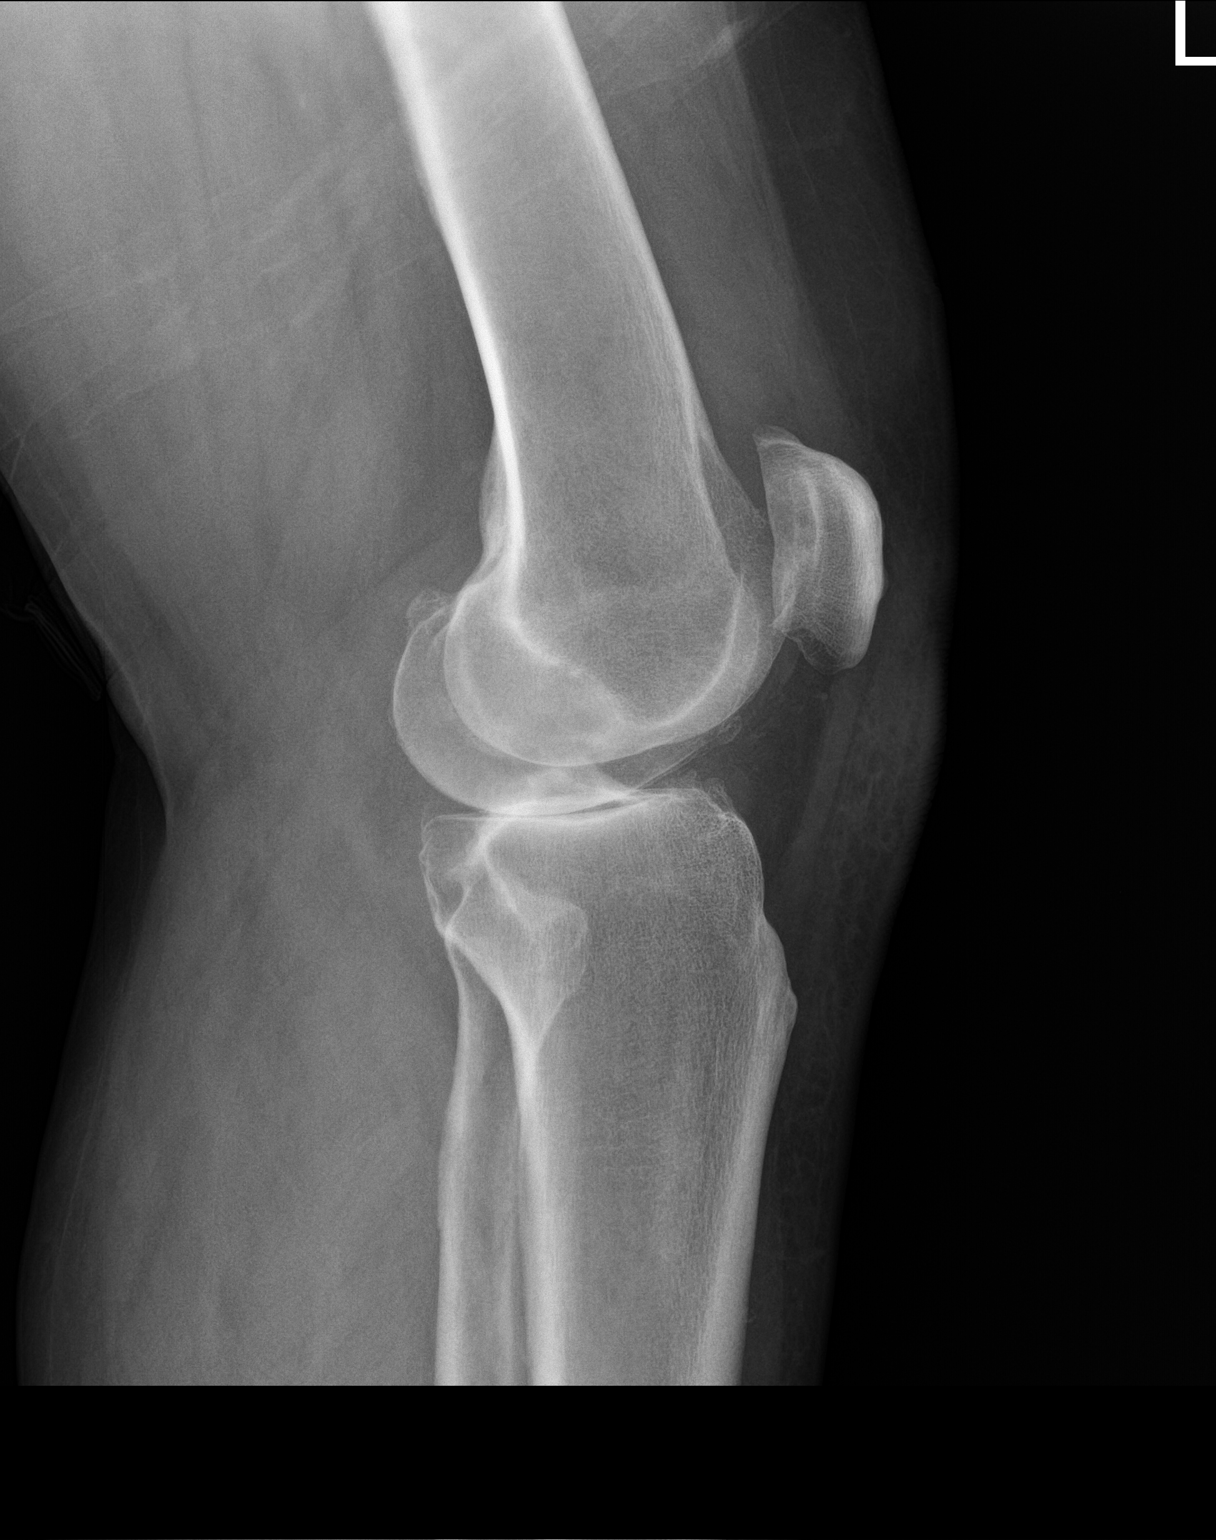

[2 of 2 positions shown; findings below may reference images not displayed]

FINDINGS: Standing AP view both knees with lateral view of the left knee.
There is significant medial compartment joint space narrowing with
near complete joint space loss of the left knee. Mild-to-moderate
tricompartmental peripheral spurs. Small knee joint effusion. No
erosion or bone destruction. Mild prepatellar soft tissue edema. AP
view of the right knee demonstrates moderate medial compartment
joint space narrowing to a lesser extent. Mild peripheral
tibiofemoral spurring.
IMPRESSION: 1. Tricompartmental osteoarthritis of the left knee, severe in the
medial compartment. There is been progression in degenerative change
2. Small joint effusion.
3. Medial compartment osteoarthritis of the right knee.

## 2022-12-14 DIAGNOSIS — Z79899 Other long term (current) drug therapy: Secondary | ICD-10-CM | POA: Diagnosis not present

## 2022-12-15 DIAGNOSIS — M542 Cervicalgia: Secondary | ICD-10-CM | POA: Diagnosis not present

## 2022-12-15 DIAGNOSIS — M5030 Other cervical disc degeneration, unspecified cervical region: Secondary | ICD-10-CM | POA: Insufficient documentation

## 2023-01-01 ENCOUNTER — Other Ambulatory Visit: Payer: Self-pay | Admitting: Family Medicine

## 2023-01-01 DIAGNOSIS — F339 Major depressive disorder, recurrent, unspecified: Secondary | ICD-10-CM

## 2023-01-01 DIAGNOSIS — F411 Generalized anxiety disorder: Secondary | ICD-10-CM

## 2023-01-05 ENCOUNTER — Other Ambulatory Visit: Payer: Self-pay | Admitting: Family Medicine

## 2023-01-05 DIAGNOSIS — Z1212 Encounter for screening for malignant neoplasm of rectum: Secondary | ICD-10-CM

## 2023-01-05 DIAGNOSIS — Z1211 Encounter for screening for malignant neoplasm of colon: Secondary | ICD-10-CM

## 2023-01-16 DIAGNOSIS — Z79899 Other long term (current) drug therapy: Secondary | ICD-10-CM | POA: Diagnosis not present

## 2023-02-06 DIAGNOSIS — Z79899 Other long term (current) drug therapy: Secondary | ICD-10-CM | POA: Diagnosis not present

## 2023-02-07 ENCOUNTER — Encounter: Payer: Self-pay | Admitting: Family Medicine

## 2023-02-07 ENCOUNTER — Ambulatory Visit (INDEPENDENT_AMBULATORY_CARE_PROVIDER_SITE_OTHER): Payer: Medicare Other | Admitting: Family Medicine

## 2023-02-07 VITALS — BP 131/79 | HR 72 | Temp 98.1°F | Ht 73.0 in | Wt 305.0 lb

## 2023-02-07 DIAGNOSIS — R051 Acute cough: Secondary | ICD-10-CM | POA: Diagnosis not present

## 2023-02-07 DIAGNOSIS — H66003 Acute suppurative otitis media without spontaneous rupture of ear drum, bilateral: Secondary | ICD-10-CM | POA: Diagnosis not present

## 2023-02-07 DIAGNOSIS — J069 Acute upper respiratory infection, unspecified: Secondary | ICD-10-CM | POA: Diagnosis not present

## 2023-02-07 MED ORDER — AMOXICILLIN 875 MG PO TABS
875.0000 mg | ORAL_TABLET | Freq: Two times a day (BID) | ORAL | 0 refills | Status: AC
Start: 2023-02-07 — End: 2023-02-17

## 2023-02-07 MED ORDER — BENZONATATE 100 MG PO CAPS
100.0000 mg | ORAL_CAPSULE | Freq: Three times a day (TID) | ORAL | 0 refills | Status: DC | PRN
Start: 2023-02-07 — End: 2023-04-24

## 2023-02-07 MED ORDER — METHYLPREDNISOLONE ACETATE 40 MG/ML IJ SUSP
40.0000 mg | Freq: Once | INTRAMUSCULAR | Status: AC
Start: 2023-02-07 — End: 2023-02-07
  Administered 2023-02-07: 60 mg via INTRAMUSCULAR

## 2023-02-07 NOTE — Progress Notes (Signed)
Subjective:  Patient ID: Arthur Foster, male    DOB: 05/03/1950, 72 y.o.   MRN: [id number]  Patient Care Team: Sonny Masters, FNP as PCP - General (Family Medicine)   Chief Complaint:  Cough, Nasal Congestion, and Sore Throat (X 3 days - otc night quil )   HPI: Arthur Foster is a 72 y.o. male presenting on 02/07/2023 for Cough, Nasal Congestion, and Sore Throat (X 3 days - otc night quil )   Discussed the use of AI scribe software for clinical note transcription with the patient, who gave verbal consent to proceed.  History of Present Illness   The patient, a known case of degenerative disc disease, presented with a three-day history of upper respiratory symptoms, including a persistent cough and nasal congestion. The cough was initially intermittent but became more frequent, occurring every minute. The patient also reported productive cough with expectoration. Despite taking Nyquil, the cough persisted, albeit with reduced frequency. The patient denied any associated fever or shortness of breath.  The patient's wife was also reportedly unwell, suggesting a possible infectious etiology.  The patient also reported a recent exacerbation of symptoms related to their known degenerative disc disease. They described experiencing pins and needles in their arms at night, along with a sensation of heat in their hands. These symptoms were relieved by changing position. The patient had previously been prescribed a course of prednisone for lower back pain, which had provided temporary relief. However, the symptoms recurred after a month. The patient was also undergoing physical therapy, which provided only transient relief.          Relevant past medical, surgical, family, and social history reviewed and updated as indicated.  Allergies and medications reviewed and updated. Data reviewed: Chart in Epic.   Past Medical History:  Diagnosis Date   Anxiety    BPH (benign prostatic hyperplasia)     Depression    Hyperlipidemia    Hypertension    Testosterone deficiency     Past Surgical History:  Procedure Laterality Date   FOOT SURGERY     HERNIA REPAIR Left inguinal    Social History   Socioeconomic History   Marital status: Married    Spouse name: [name]   Number of children: 3   Years of education: 12   Highest education level: High school graduate  Occupational History   Occupation: retired    Comment: works a few hours a week  Tobacco Use   Smoking status: Former    Current packs/day: 0.00    Types: Cigarettes    Quit date: 1998    Years since quitting: 26.8   Smokeless tobacco: Former    Types: Chew    Quit date: 2002  Vaping Use   Vaping status: Never Used  Substance and Sexual Activity   Alcohol use: Yes    Alcohol/week: 14.0 standard drinks of alcohol    Types: 14 Cans of beer per week   Drug use: No   Sexual activity: Yes    Birth control/protection: None  Other Topics Concern   Not on file  Social History Narrative   3 daughters, 2 live in [place]. 6 grandchildren.   Was married x 33 years prior to first wife's death.   Remarried to [employer].   Social Determinants of Health   Financial Resource Strain: Low Risk  (04/28/2022)   Overall Financial Resource Strain (CARDIA)    Difficulty of Paying Living Expenses: Not hard at all  Food Insecurity:  No Food Insecurity (04/28/2022)   Hunger Vital Sign    Worried About Running Out of Food in the Last Year: Never true    Ran Out of Food in the Last Year: Never true  Transportation Needs: No Transportation Needs (04/28/2022)   PRAPARE - Administrator, Civil Service (Medical): No    Lack of Transportation (Non-Medical): No  Physical Activity: Sufficiently Active (04/28/2022)   Exercise Vital Sign    Days of Exercise per Week: 5 days    Minutes of Exercise per Session: 30 min  Stress: No Stress Concern Present (04/28/2022)   [employer] of Occupational Health - Occupational Stress  Questionnaire    Feeling of Stress : Not at all  Social Connections: Moderately Isolated (04/28/2022)   Social Connection and Isolation Panel [NHANES]    Frequency of Communication with Friends and Family: More than three times a week    Frequency of Social Gatherings with Friends and Family: More than three times a week    Attends Religious Services: Never    Database administrator or Organizations: No    Attends Banker Meetings: Never    Marital Status: Married  Catering manager Violence: Not At Risk (04/28/2022)   Humiliation, Afraid, Rape, and Kick questionnaire    Fear of Current or Ex-Partner: No    Emotionally Abused: No    Physically Abused: No    Sexually Abused: No    Outpatient Encounter Medications as of 02/07/2023  Medication Sig   ALPRAZolam (XANAX) 0.25 MG tablet Take 1 to 2 tablets as needed   amoxicillin (AMOXIL) 875 MG tablet Take 1 tablet (875 mg total) by mouth 2 (two) times daily for 10 days.   benzonatate (TESSALON PERLES) 100 MG capsule Take 1 capsule (100 mg total) by mouth 3 (three) times daily as needed.   Cholecalciferol 1.25 MG (50000 UT) capsule Take by mouth.   escitalopram (LEXAPRO) 20 MG tablet TAKE ONE TABLET DAILY   lisinopril (ZESTRIL) 20 MG tablet Take 1 tablet (20 mg total) by mouth daily.   Multiple Vitamins-Minerals (MULTIVITAMIN WITH MINERALS) tablet Take 1 tablet by mouth daily.   cetirizine (ZYRTEC) 10 MG tablet Take 10 mg by mouth daily. (Patient not taking: Reported on 02/07/2023)   [EXPIRED] methylPREDNISolone acetate (DEPO-MEDROL) injection 40 mg    No facility-administered encounter medications on file as of 02/07/2023.    Allergies  Allergen Reactions   Bee Venom    Other Other (See Comments)   Short Ragweed Pollen Ext Other (See Comments)   Zolpidem Other (See Comments)   Ambien [Zolpidem Tartrate] Other (See Comments)    Pertinent ROS per HPI, otherwise unremarkable      Objective:  BP 131/79   Pulse 72   Temp  98.1 F (36.7 C) (Temporal)   Ht 6\' 1"  (1.854 m)   Wt (!) 305 lb (138.3 kg)   SpO2 95%   BMI 40.24 kg/m    Wt Readings from Last 3 Encounters:  02/07/23 (!) 305 lb (138.3 kg)  11/10/22 (!) 308 lb 6 oz (139.9 kg)  09/05/22 (!) 309 lb (140.2 kg)    Physical Exam Vitals and nursing note reviewed.  Constitutional:      General: He is not in acute distress.    Appearance: He is well-developed. He is morbidly obese. He is not ill-appearing, toxic-appearing or diaphoretic.  HENT:     Head: Normocephalic and atraumatic.     Right Ear: Tympanic membrane is erythematous and  bulging.     Left Ear: Tympanic membrane is erythematous and bulging.     Nose: Congestion and rhinorrhea present. Rhinorrhea is purulent.     Right Turbinates: Swollen.     Left Turbinates: Swollen.     Mouth/Throat:     Mouth: Mucous membranes are moist. No oral lesions.     Pharynx: Posterior oropharyngeal erythema and postnasal drip present. No pharyngeal swelling, oropharyngeal exudate or uvula swelling.     Tonsils: 2+ on the right. 2+ on the left.  Eyes:     Conjunctiva/sclera: Conjunctivae normal.     Pupils: Pupils are equal, round, and reactive to light.  Cardiovascular:     Rate and Rhythm: Normal rate and regular rhythm.     Heart sounds: Normal heart sounds.  Pulmonary:     Effort: Pulmonary effort is normal.     Breath sounds: Normal breath sounds.  Abdominal:     General: Bowel sounds are normal.     Palpations: Abdomen is soft.  Musculoskeletal:     Cervical back: Normal range of motion and neck supple.  Skin:    General: Skin is warm and dry.     Capillary Refill: Capillary refill takes less than 2 seconds.  Neurological:     General: No focal deficit present.     Mental Status: He is alert and oriented to person, place, and time.  Psychiatric:        Mood and Affect: Mood normal.        Behavior: Behavior normal.        Thought Content: Thought content normal.        Judgment: Judgment  normal.    Physical Exam   HEENT: Ears red and bulging. Throat red.        Results for orders placed or performed in visit on 11/10/22  CBC with Differential/Platelet  Result Value Ref Range   WBC 6.8 3.4 - 10.8 x10E3/uL   RBC 5.10 4.14 - 5.80 x10E6/uL   Hemoglobin 15.3 13.0 - 17.7 g/dL   Hematocrit 96.0 45.4 - 51.0 %   MCV 91 79 - 97 fL   MCH 30.0 26.6 - 33.0 pg   MCHC 32.9 31.5 - 35.7 g/dL   RDW 09.8 11.9 - 14.7 %   Platelets 339 150 - 450 x10E3/uL   Neutrophils 55 Not Estab. %   Lymphs 30 Not Estab. %   Monocytes 8 Not Estab. %   Eos 5 Not Estab. %   Basos 1 Not Estab. %   Neutrophils Absolute 3.8 1.4 - 7.0 x10E3/uL   Lymphocytes Absolute 2.0 0.7 - 3.1 x10E3/uL   Monocytes Absolute 0.5 0.1 - 0.9 x10E3/uL   EOS (ABSOLUTE) 0.3 0.0 - 0.4 x10E3/uL   Basophils Absolute 0.1 0.0 - 0.2 x10E3/uL   Immature Granulocytes 1 Not Estab. %   Immature Grans (Abs) 0.1 0.0 - 0.1 x10E3/uL  CMP14+EGFR  Result Value Ref Range   Glucose 104 (H) 70 - 99 mg/dL   BUN 19 8 - 27 mg/dL   Creatinine, Ser 8.29 0.76 - 1.27 mg/dL   eGFR 88 >56 OZ/HYQ/6.57   BUN/Creatinine Ratio 20 10 - 24   Sodium 140 134 - 144 mmol/L   Potassium 5.1 3.5 - 5.2 mmol/L   Chloride 103 96 - 106 mmol/L   CO2 19 (L) 20 - 29 mmol/L   Calcium 9.6 8.6 - 10.2 mg/dL   Total Protein 6.8 6.0 - 8.5 g/dL   Albumin 4.4 3.9 - 4.9  g/dL   Globulin, Total 2.4 1.5 - 4.5 g/dL   Bilirubin Total 0.8 0.0 - 1.2 mg/dL   Alkaline Phosphatase 71 44 - 121 IU/L   AST 22 0 - 40 IU/L   ALT 24 0 - 44 IU/L  Lipid panel  Result Value Ref Range   Cholesterol, Total 201 (H) 100 - 199 mg/dL   Triglycerides 478 0 - 149 mg/dL   HDL 48 >29 mg/dL   VLDL Cholesterol Cal 23 5 - 40 mg/dL   LDL Chol Calc (NIH) 562 (H) 0 - 99 mg/dL   Chol/HDL Ratio 4.2 0.0 - 5.0 ratio  Thyroid Panel With TSH  Result Value Ref Range   TSH 2.180 0.450 - 4.500 uIU/mL   T4, Total 6.7 4.5 - 12.0 ug/dL   T3 Uptake Ratio 27 24 - 39 %   Free Thyroxine Index 1.8 1.2 -  4.9  Bayer DCA Hb A1c Waived  Result Value Ref Range   HB A1C (BAYER DCA - WAIVED) 5.8 (H) 4.8 - 5.6 %  VITAMIN D 25 Hydroxy (Vit-D Deficiency, Fractures)  Result Value Ref Range   Vit D, 25-Hydroxy 46.4 30.0 - 100.0 ng/mL       Pertinent labs & imaging results that were available during my care of the patient were reviewed by me and considered in my medical decision making.  Assessment & Plan:  Arthur Foster" was seen today for cough, nasal congestion and sore throat.  Diagnoses and all orders for this visit:  Acute cough -     COVID-19, Flu A+B and RSV -     amoxicillin (AMOXIL) 875 MG tablet; Take 1 tablet (875 mg total) by mouth 2 (two) times daily for 10 days. -     benzonatate (TESSALON PERLES) 100 MG capsule; Take 1 capsule (100 mg total) by mouth 3 (three) times daily as needed.  URI with cough and congestion -     amoxicillin (AMOXIL) 875 MG tablet; Take 1 tablet (875 mg total) by mouth 2 (two) times daily for 10 days. -     benzonatate (TESSALON PERLES) 100 MG capsule; Take 1 capsule (100 mg total) by mouth 3 (three) times daily as needed. -     methylPREDNISolone acetate (DEPO-MEDROL) injection 40 mg  Non-recurrent acute suppurative otitis media of both ears without spontaneous rupture of tympanic membranes -     amoxicillin (AMOXIL) 875 MG tablet; Take 1 tablet (875 mg total) by mouth 2 (two) times daily for 10 days.     Assessment and Plan    Upper Respiratory Infection Persistent cough, nasal congestion, and rhinorrhea for 3 days. No fever or shortness of breath. Bilateral otitis media and pharyngitis on exam. COVID-19 test pending. -Administer steroid injection today for congestion. -Prescribe Amoxicillin 500mg  BID for 10 days for otitis media. -Prescribe Tessalon Perles as needed for cough.  Degenerative Disc Disease Reports of pins and needles sensation in arms at night and occasional hot hands. Previous relief with prednisone. Currently under care of  orthopedic specialist. -Administer steroid injection today which may provide temporary relief for back symptoms.  Follow-up -Notify patient of COVID-19 test results on 02/08/2023. -Advise patient to report if symptoms do not improve or worsen.          Continue all other maintenance medications.  Follow up plan: Return if symptoms worsen or fail to improve.   Continue healthy lifestyle choices, including diet (rich in fruits, vegetables, and lean proteins, and low in salt and simple carbohydrates) and  exercise (at least 30 minutes of moderate physical activity daily).  Educational handout given for URI  The above assessment and management plan was discussed with the patient. The patient verbalized understanding of and has agreed to the management plan. Patient is aware to call the clinic if they develop any new symptoms or if symptoms persist or worsen. Patient is aware when to return to the clinic for a follow-up visit. Patient educated on when it is appropriate to go to the emergency department.   Kari Baars, FNP-C [facility] [phone]

## 2023-02-08 LAB — COVID-19, FLU A+B AND RSV
Influenza A, NAA: NOT DETECTED
Influenza B, NAA: NOT DETECTED
RSV, NAA: NOT DETECTED
SARS-CoV-2, NAA: NOT DETECTED

## 2023-03-09 DIAGNOSIS — Z79899 Other long term (current) drug therapy: Secondary | ICD-10-CM | POA: Diagnosis not present

## 2023-03-29 ENCOUNTER — Other Ambulatory Visit: Payer: Self-pay | Admitting: Family Medicine

## 2023-03-29 DIAGNOSIS — F411 Generalized anxiety disorder: Secondary | ICD-10-CM

## 2023-03-29 DIAGNOSIS — F339 Major depressive disorder, recurrent, unspecified: Secondary | ICD-10-CM

## 2023-03-29 DIAGNOSIS — Z79899 Other long term (current) drug therapy: Secondary | ICD-10-CM

## 2023-04-03 ENCOUNTER — Other Ambulatory Visit: Payer: Self-pay | Admitting: Family Medicine

## 2023-04-03 DIAGNOSIS — F411 Generalized anxiety disorder: Secondary | ICD-10-CM

## 2023-04-03 DIAGNOSIS — F339 Major depressive disorder, recurrent, unspecified: Secondary | ICD-10-CM

## 2023-04-03 DIAGNOSIS — Z79899 Other long term (current) drug therapy: Secondary | ICD-10-CM

## 2023-04-17 DIAGNOSIS — Z79899 Other long term (current) drug therapy: Secondary | ICD-10-CM | POA: Diagnosis not present

## 2023-04-24 ENCOUNTER — Ambulatory Visit: Payer: Medicare Other | Admitting: Family Medicine

## 2023-04-24 ENCOUNTER — Other Ambulatory Visit: Payer: Self-pay | Admitting: Family Medicine

## 2023-04-24 VITALS — BP 153/88 | HR 64 | Temp 97.1°F | Ht 73.0 in | Wt 306.6 lb

## 2023-04-24 DIAGNOSIS — I10 Essential (primary) hypertension: Secondary | ICD-10-CM

## 2023-04-24 DIAGNOSIS — F411 Generalized anxiety disorder: Secondary | ICD-10-CM

## 2023-04-24 DIAGNOSIS — F339 Major depressive disorder, recurrent, unspecified: Secondary | ICD-10-CM | POA: Diagnosis not present

## 2023-04-24 DIAGNOSIS — Z6841 Body Mass Index (BMI) 40.0 and over, adult: Secondary | ICD-10-CM

## 2023-04-24 DIAGNOSIS — E782 Mixed hyperlipidemia: Secondary | ICD-10-CM | POA: Diagnosis not present

## 2023-04-24 DIAGNOSIS — R7303 Prediabetes: Secondary | ICD-10-CM

## 2023-04-24 DIAGNOSIS — Z79899 Other long term (current) drug therapy: Secondary | ICD-10-CM

## 2023-04-24 DIAGNOSIS — M5030 Other cervical disc degeneration, unspecified cervical region: Secondary | ICD-10-CM

## 2023-04-24 LAB — BAYER DCA HB A1C WAIVED: HB A1C (BAYER DCA - WAIVED): 5.4 % (ref 4.8–5.6)

## 2023-04-24 MED ORDER — ALPRAZOLAM 0.25 MG PO TABS: ORAL_TABLET | ORAL | 5 refills | Status: DC

## 2023-04-24 MED ORDER — LISINOPRIL 20 MG PO TABS: 20.0000 mg | ORAL_TABLET | Freq: Every day | ORAL | 1 refills | Status: DC

## 2023-04-24 NOTE — Progress Notes (Signed)
Subjective:  Patient ID: Arthur Foster, male    DOB: 12-31-1950, 73 y.o.   MRN: [id number]  Patient Care Team: Sonny Masters, FNP as PCP - General (Family Medicine)   Chief Complaint:  Medical Management of Chronic Issues (Chronic check up )   HPI: Arthur Foster is a 73 y.o. male presenting on 04/24/2023 for Medical Management of Chronic Issues (Chronic check up )   Discussed the use of AI scribe software for clinical note transcription with the patient, who gave verbal consent to proceed.  History of Present Illness   The patient, with a history of hypertension, presented with complaints of persistent neck pain and numbness in the arms. These symptoms began after a head injury sustained from bumping their head on the bottom of a tractor. The patient reported temporary relief from physical therapy, including dry needling, but the symptoms persist. An X-ray revealed degenerative disc disease between the C4-C5 and C5-C6 vertebrae. The patient has been advised to perform neck exercises at home, but adherence has been inconsistent due to the pain typically occurring late at night.  They have been experiencing sleep disturbances, which they attribute to their neck pain. The patient has been self-medicating with ibuprofen and alcohol, usually one to three beers in the evening, which they report provides some relief.  The patient has a history of depression and anxiety, for which they are taking Lexapro and Xanax respectively. They reported no issues with these medications. The patient also has a history of hypertension, for which they are taking Lisinopril. They reported no issues with blood pressure control.  The patient has a history of high cholesterol, which they manage with diet. However, they admitted to frequently consuming bacon or sausage sandwiches, which they are aware may not be beneficial for their cholesterol levels. Despite this, their last cholesterol check six months ago was not  concerning.  The patient expressed a desire for further intervention for their neck pain, including the possibility of surgery. They also expressed a preference for physical therapy closer to their home. They have been referred to a local therapy center for further management.          Relevant past medical, surgical, family, and social history reviewed and updated as indicated.  Allergies and medications reviewed and updated. Data reviewed: Chart in Epic.   Past Medical History:  Diagnosis Date   Anxiety    BPH (benign prostatic hyperplasia)    Depression    Hyperlipidemia    Hypertension    Testosterone deficiency     Past Surgical History:  Procedure Laterality Date   FOOT SURGERY     HERNIA REPAIR Left inguinal    Social History   Socioeconomic History   Marital status: Married    Spouse name: [name]   Number of children: 3   Years of education: 12   Highest education level: 12th grade  Occupational History   Occupation: retired    Comment: works a few hours a week  Tobacco Use   Smoking status: Former    Current packs/day: 0.00    Types: Cigarettes    Quit date: 1998    Years since quitting: 27.0   Smokeless tobacco: Former    Types: Chew    Quit date: 2002  Vaping Use   Vaping status: Never Used  Substance and Sexual Activity   Alcohol use: Yes    Alcohol/week: 14.0 standard drinks of alcohol    Types: 14 Cans of beer per week  Drug use: No   Sexual activity: Yes    Birth control/protection: None  Other Topics Concern   Not on file  Social History Narrative   3 daughters, 2 live in [place]. 6 grandchildren.   Was married x 33 years prior to first wife's death.   Remarried to [employer].   Social Drivers of Corporate investment banker Strain: Low Risk  (04/24/2023)   Overall Financial Resource Strain (CARDIA)    Difficulty of Paying Living Expenses: Not hard at all  Food Insecurity: No Food Insecurity (04/24/2023)   Hunger Vital Sign    Worried  About Running Out of Food in the Last Year: Never true    Ran Out of Food in the Last Year: Never true  Transportation Needs: No Transportation Needs (04/24/2023)   PRAPARE - Administrator, Civil Service (Medical): No    Lack of Transportation (Non-Medical): No  Physical Activity: Inactive (04/24/2023)   Exercise Vital Sign    Days of Exercise per Week: 0 days    Minutes of Exercise per Session: 30 min  Stress: No Stress Concern Present (04/24/2023)   [employer] of Occupational Health - Occupational Stress Questionnaire    Feeling of Stress : Only a little  Social Connections: Moderately Isolated (04/24/2023)   Social Connection and Isolation Panel [NHANES]    Frequency of Communication with Friends and Family: More than three times a week    Frequency of Social Gatherings with Friends and Family: More than three times a week    Attends Religious Services: Never    Database administrator or Organizations: No    Attends Banker Meetings: Never    Marital Status: Married  Catering manager Violence: Not At Risk (04/28/2022)   Humiliation, Afraid, Rape, and Kick questionnaire    Fear of Current or Ex-Partner: No    Emotionally Abused: No    Physically Abused: No    Sexually Abused: No    Outpatient Encounter Medications as of 04/24/2023  Medication Sig   Multiple Vitamins-Minerals (MULTIVITAMIN WITH MINERALS) tablet Take 1 tablet by mouth daily.   [DISCONTINUED] ALPRAZolam (XANAX) 0.25 MG tablet Take 1 to 2 tablets as needed   [DISCONTINUED] escitalopram (LEXAPRO) 20 MG tablet TAKE ONE TABLET DAILY   [DISCONTINUED] lisinopril (ZESTRIL) 20 MG tablet Take 1 tablet (20 mg total) by mouth daily.   ALPRAZolam (XANAX) 0.25 MG tablet Take 1 to 2 tablets as needed   lisinopril (ZESTRIL) 20 MG tablet Take 1 tablet (20 mg total) by mouth daily.   [DISCONTINUED] benzonatate (TESSALON PERLES) 100 MG capsule Take 1 capsule (100 mg total) by mouth 3 (three) times daily as  needed.   [DISCONTINUED] cetirizine (ZYRTEC) 10 MG tablet Take 10 mg by mouth daily. (Patient not taking: Reported on 02/07/2023)   [DISCONTINUED] Cholecalciferol 1.25 MG (50000 UT) capsule Take by mouth.   No facility-administered encounter medications on file as of 04/24/2023.    Allergies  Allergen Reactions   Bee Venom    Other Other (See Comments)   Short Ragweed Pollen Ext Other (See Comments)   Zolpidem Other (See Comments)   Ambien [Zolpidem Tartrate] Other (See Comments)    Pertinent ROS per HPI, otherwise unremarkable      Objective:  BP (!) 153/88   Pulse 64   Temp (!) 97.1 F (36.2 C)   Ht 6\' 1"  (1.854 m)   Wt (!) 306 lb 9.6 oz (139.1 kg)   SpO2 97%   BMI  40.45 kg/m    Wt Readings from Last 3 Encounters:  04/24/23 (!) 306 lb 9.6 oz (139.1 kg)  02/07/23 (!) 305 lb (138.3 kg)  11/10/22 (!) 308 lb 6 oz (139.9 kg)    Physical Exam Vitals and nursing note reviewed.  Constitutional:      General: He is not in acute distress.    Appearance: Normal appearance. He is obese. He is not ill-appearing, toxic-appearing or diaphoretic.  HENT:     Head: Normocephalic and atraumatic.     Nose: Nose normal.     Mouth/Throat:     Mouth: Mucous membranes are moist.  Eyes:     Conjunctiva/sclera: Conjunctivae normal.     Pupils: Pupils are equal, round, and reactive to light.  Cardiovascular:     Rate and Rhythm: Normal rate and regular rhythm.     Heart sounds: Normal heart sounds.  Pulmonary:     Effort: Pulmonary effort is normal.     Breath sounds: Normal breath sounds.  Musculoskeletal:        General: Normal range of motion.     Cervical back: Normal range of motion and neck supple.     Right lower leg: No edema.     Left lower leg: No edema.  Skin:    General: Skin is warm and dry.     Capillary Refill: Capillary refill takes less than 2 seconds.  Neurological:     General: No focal deficit present.     Mental Status: He is alert and oriented to person,  place, and time.  Psychiatric:        Mood and Affect: Mood normal.        Behavior: Behavior normal.        Thought Content: Thought content normal.        Judgment: Judgment normal.      Results for orders placed or performed in visit on 02/07/23  COVID-19, Flu A+B and RSV   Collection Time: 02/07/23  8:35 AM   Specimen: Nasopharyngeal(NP) swabs in vial transport medium  Result Value Ref Range   SARS-CoV-2, NAA Not Detected Not Detected   Influenza A, NAA Not Detected Not Detected   Influenza B, NAA Not Detected Not Detected   RSV, NAA Not Detected Not Detected   Test Information: Comment        Pertinent labs & imaging results that were available during my care of the patient were reviewed by me and considered in my medical decision making.  Assessment & Plan:  Arthur Foster" was seen today for medical management of chronic issues.  Diagnoses and all orders for this visit:  Prediabetes -     Bayer DCA Hb A1c Waived  Essential hypertension, benign -     Lipid panel -     lisinopril (ZESTRIL) 20 MG tablet; Take 1 tablet (20 mg total) by mouth daily.  Depression, recurrent (HCC) -     ToxASSURE Select 13 (MW), Urine -     ALPRAZolam (XANAX) 0.25 MG tablet; Take 1 to 2 tablets as needed  Generalized anxiety disorder -     ToxASSURE Select 13 (MW), Urine -     ALPRAZolam (XANAX) 0.25 MG tablet; Take 1 to 2 tablets as needed  Controlled substance agreement signed -     ToxASSURE Select 13 (MW), Urine -     ALPRAZolam (XANAX) 0.25 MG tablet; Take 1 to 2 tablets as needed  Morbid obesity (HCC) -     Lipid panel -  Bayer DCA Hb A1c Waived  DDD (degenerative disc disease), cervical -     Ambulatory referral to Physical Therapy  Mixed dyslipidemia -     Lipid panel     Assessment and Plan    Cervical Degenerative Disc Disease Chronic cervical degenerative disc disease at C4-C5 and C5-C6 with associated arm tingling and numbness, exacerbated by a recent head  injury. Previous physical therapy provided temporary relief, but the patient has been inconsistent with home exercises. Discussed potential surgical interventions, but the patient prefers physical therapy closer to home. Additional imaging (CT/MRI) may be considered if no improvement is noted. - Refer to physical therapy in [place] - Pursue additional imaging (CT/MRI) if no improvement - Consider referral to a different orthopedic specialist if necessary  Generalized Anxiety Disorder Chronic anxiety managed with Xanax and Lexapro. The patient reports occasional difficulty sleeping, potentially exacerbated by anxiety and physical discomfort. Discussed long-term use of Xanax and Lexapro, and the patient's preference to continue these medications indefinitely. - Continue current medications (Xanax and Lexapro) - Monitor for changes in symptoms or medication efficacy  Hypertension Well-controlled hypertension with current medication regimen. No recent issues reported. - Continue current antihypertensive medication (lisinopril)  Hyperlipidemia Hyperlipidemia with previous lab results showing total cholesterol of 198, triglycerides 159, LDL 125, and HDL 45. The patient admits to a diet high in bacon and sausage, which may affect cholesterol levels. - Recheck cholesterol levels today - Discuss dietary modifications to reduce intake of high-cholesterol foods  General Health Maintenance Routine health maintenance discussed, including the importance of regular screenings and vaccinations. The patient has a history of avoiding certain screenings like colonoscopies. - Schedule annual wellness visit for May 01, 2023 - Discuss advanced directives and health maintenance during the wellness visit - Reschedule physical exam and labs for six months from now  Follow-up - Follow-up in six months for physical exam and lab work - Coordinate all prescriptions to renew on the same schedule - Contact the  office for further evaluation if issues arise with physical therapy or symptoms persist.          Continue all other maintenance medications.  Follow up plan: Return in about 6 months (around 10/22/2023) for Annual Physical.   Continue healthy lifestyle choices, including diet (rich in fruits, vegetables, and lean proteins, and low in salt and simple carbohydrates) and exercise (at least 30 minutes of moderate physical activity daily).  Educational handout given for prediabetes   The above assessment and management plan was discussed with the patient. The patient verbalized understanding of and has agreed to the management plan. Patient is aware to call the clinic if they develop any new symptoms or if symptoms persist or worsen. Patient is aware when to return to the clinic for a follow-up visit. Patient educated on when it is appropriate to go to the emergency department.   Kari Baars, FNP-C [facility] [phone]

## 2023-04-24 NOTE — Addendum Note (Signed)
Addended by: Sonny Masters on: 04/24/2023 03:32 PM   Modules accepted: Level of Service

## 2023-04-25 ENCOUNTER — Encounter: Payer: Self-pay | Admitting: Family Medicine

## 2023-04-25 DIAGNOSIS — Z1211 Encounter for screening for malignant neoplasm of colon: Secondary | ICD-10-CM | POA: Diagnosis not present

## 2023-04-25 DIAGNOSIS — Z1212 Encounter for screening for malignant neoplasm of rectum: Secondary | ICD-10-CM | POA: Diagnosis not present

## 2023-04-25 LAB — LIPID PANEL
Chol/HDL Ratio: 4.6 {ratio} (ref 0.0–5.0)
Cholesterol, Total: 223 mg/dL — ABNORMAL HIGH (ref 100–199)
HDL: 48 mg/dL (ref >39)
LDL Chol Calc (NIH): 142 mg/dL — ABNORMAL HIGH (ref 0–99)
Triglycerides: 186 mg/dL — ABNORMAL HIGH (ref 0–149)
VLDL Cholesterol Cal: 33 mg/dL (ref 5–40)

## 2023-04-26 LAB — TOXASSURE SELECT 13 (MW), URINE

## 2023-04-30 ENCOUNTER — Other Ambulatory Visit: Payer: Self-pay

## 2023-04-30 ENCOUNTER — Ambulatory Visit: Payer: Medicare Other | Attending: Family Medicine | Admitting: Physical Therapy

## 2023-04-30 ENCOUNTER — Encounter: Payer: Self-pay | Admitting: Physical Therapy

## 2023-04-30 DIAGNOSIS — M62838 Other muscle spasm: Secondary | ICD-10-CM | POA: Insufficient documentation

## 2023-04-30 DIAGNOSIS — M542 Cervicalgia: Secondary | ICD-10-CM | POA: Insufficient documentation

## 2023-04-30 DIAGNOSIS — M6281 Muscle weakness (generalized): Secondary | ICD-10-CM | POA: Insufficient documentation

## 2023-04-30 DIAGNOSIS — M5030 Other cervical disc degeneration, unspecified cervical region: Secondary | ICD-10-CM | POA: Insufficient documentation

## 2023-04-30 NOTE — Therapy (Signed)
OUTPATIENT PHYSICAL THERAPY CERVICAL EVALUATION   Patient Name: Arthur Foster MRN: [id number] DOB:1950-12-09, 73 y.o., male Today's Date: 04/30/2023  END OF SESSION:  PT End of Session - 04/30/23 0914     Visit Number 1    Number of Visits 12    Date for PT Re-Evaluation 06/11/23    PT Start Time 0802    PT Stop Time 0849    PT Time Calculation (min) 47 min    Activity Tolerance Patient tolerated treatment well    Behavior During Therapy [facility] for tasks assessed/performed             Past Medical History:  Diagnosis Date   Anxiety    BPH (benign prostatic hyperplasia)    Depression    Hyperlipidemia    Hypertension    Testosterone deficiency    Past Surgical History:  Procedure Laterality Date   FOOT SURGERY     HERNIA REPAIR Left inguinal   Patient Active Problem List   Diagnosis Date Noted   DDD (degenerative disc disease), cervical 12/15/2022   Prediabetes 05/11/2022   Controlled substance agreement signed 11/22/2018   Vitamin D deficiency 11/22/2018   Sensorineural hearing loss (SNHL), bilateral 06/17/2018   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  THERAPY DIAG:  Cervicalgia  Other muscle spasm  Muscle weakness (generalized)  Rationale for Evaluation and Treatment: Rehabilitation  ONSET DATE: 02/2022.  SUBJECTIVE:                                                                                                                                                                                                          SUBJECTIVE STATEMENT: The patient presents to the clinic with c/o left-sided neck pain and pains and needles that will travel down his left UE and wakes him at night.  The onset of symptoms correlated with him hitting his head on a mower in His pain at rest today is rated at a 4/10. November of 2023.  He was last seen in PT on 11/09/22.    PERTINENT HISTORY:  HTN.  PAIN:  Are you having pain? Yes: NPRS scale: 4/10.  Pain location: Left cervical. Pain description: Pins, needles, numb and ache. Aggravating factors: Raising arms up and increased UE use. Relieving factors: Arms hanging straight down.   PRECAUTIONS: None  RED FLAGS: None     WEIGHT BEARING RESTRICTIONS: No  FALLS:  Has patient fallen in last 6 months? No  LIVING ENVIRONMENT: Lives with: lives with their spouse Lives in: House/apartment Has following equipment at home: None  OCCUPATION: Personnel officer.    PLOF: Independent  PATIENT GOALS: Be able to do job without pain and symptoms. Sleep.   OBJECTIVE:  Note: Objective measures were completed at Evaluation unless otherwise noted.  DIAGNOSTIC FINDINGS:  09/05/22:  There is no evidence of cervical spine fracture or prevertebral soft tissue swelling. Loss of normal cervical lordosis. Multilevel degenerate disc disease with disc height loss and anterior osteophytes prominent at C4-C5 and C5-C6.   IMPRESSION: Multilevel degenerative disc disease most prominent at C4-C5 and C5-C6.    POSTURE: rounded shoulders and forward head  PALPATION: Left UT tender to palpation at mid-point of muscle.   CERVICAL ROM:   Left active cervical rotation is 55 degrees and right is 60 degrees.  UPPER EXTREMITY ROM:  WNL.  UPPER EXTREMITY MMT:  Left shoulder ER strength is decreased to 4/5.  Right grip (dominant side) is 80# and left is 60#.  Left brachioradialis reflex is decreased per contralateral comparison.    TREATMENT DATE: HMP and IFC at 80-150 Hz on 40% scan x 20 minutes to patient's left UT region.      Normal modality response following removal of modality.                                                                                                                             PATIENT EDUCATION:  Education details:  Person educated:  International aid/development worker:  Education comprehension:   HOME EXERCISE PROGRAM:   ASSESSMENT:  CLINICAL IMPRESSION: The patient presents to OPPT with c/o left-sided neck pain wit radiation of symptoms into his left UE.  His left Brachioradialis reflex is decreased.  He demonstrates's weakness in his left shoulder external rotators.  He has a loss of active cervical rotation. Patient will benefit from skilled physical therapy intervention to address pain and deficits.    OBJECTIVE IMPAIRMENTS: decreased activity tolerance, decreased ROM, decreased strength, increased muscle spasms, postural dysfunction, and pain.   ACTIVITY LIMITATIONS: carrying, lifting, sleeping, and reach over head  PARTICIPATION LIMITATIONS: occupation  PERSONAL FACTORS: Time since onset of injury/illness/exacerbation are also affecting patient's functional outcome.   REHAB POTENTIAL: Good-/Good.  CLINICAL DECISION MAKING: Evolving/moderate complexity  EVALUATION COMPLEXITY: Low   GOALS:  LONG TERM GOALS: Target date: 06/11/23.  Ind with an HEP. Baseline:  Goal status: INITIAL  2.  Eliminate left UE symptoms.  Goal status: INITIAL  3.  Perform ADL's with pain not > 2-3/10.  Goal status: INITIAL  4.  Sleep undisturbed.  Goal status: INITIAL  PLAN:  PT FREQUENCY: 2x/week  PT DURATION: 6 weeks  PLANNED INTERVENTIONS: 97110-Therapeutic exercises, 97530- Therapeutic activity, [id number]- Neuromuscular re-education, 97535- Self Care, 16109- Manual therapy, 97014- Electrical stimulation (unattended), 97035- Ultrasound, 60454- Traction (mechanical), Patient/Family education, Dry  Needling, Cryotherapy, and Moist heat  PLAN FOR NEXT SESSION: Combo e'stim/US at 1.50 W/CM2, STW/M, int cervical traction beginning at 18#.  Dry needling.  Postural exercises.     Tarvares Lant, [name], PT 04/30/2023, 11:40 AM

## 2023-05-01 ENCOUNTER — Ambulatory Visit: Payer: Medicare Other

## 2023-05-01 VITALS — Ht 73.0 in | Wt 306.0 lb

## 2023-05-01 DIAGNOSIS — Z0000 Encounter for general adult medical examination without abnormal findings: Secondary | ICD-10-CM | POA: Diagnosis not present

## 2023-05-01 NOTE — Patient Instructions (Signed)
Arthur Foster , Thank you for taking time to come for your Medicare Wellness Visit. I appreciate your ongoing commitment to your health goals. Please review the following plan we discussed and let me know if I can assist you in the future.   Referrals/Orders/Follow-Ups/Clinician Recommendations: Aim for 30 minutes of exercise or brisk walking, 6-8 glasses of water, and 5 servings of fruits and vegetables each day.  This is a list of the screening recommended for you and due dates:  Health Maintenance  Topic Date Due   Cologuard (Stool DNA test)  Never done   COVID-19 Vaccine (1) 05/10/2023*   Hepatitis C Screening  05/12/2023*   Flu Shot  07/02/2023*   DTaP/Tdap/Td vaccine (2 - Td or Tdap) 04/23/2024*   Medicare Annual Wellness Visit  04/30/2024   Pneumonia Vaccine  Completed   Zoster (Shingles) Vaccine  Completed   HPV Vaccine  Aged Out   Colon Cancer Screening  Discontinued  *Topic was postponed. The date shown is not the original due date.    Advanced directives: (ACP Link)Information on Advanced Care Planning can be found at [facility] Advance Health Care Directives Advance Health Care Directives ([email or web address]   Next Medicare Annual Wellness Visit scheduled for next year: Yes

## 2023-05-01 NOTE — Progress Notes (Signed)
Subjective:   Arthur Foster is a 73 y.o. male who presents for Medicare Annual/Subsequent preventive examination.  Visit Complete: Virtual I connected with  Arthur Foster on 05/01/23 by a audio enabled telemedicine application and verified that I am speaking with the correct person using two identifiers.  Patient Location: Home  Provider Location: Home Office  This patient declined Interactive audio and video telecommunications. Therefore the visit was completed with audio only.  I discussed the limitations of evaluation and management by telemedicine. The patient expressed understanding and agreed to proceed.  Vital Signs: Because this visit was a virtual/telehealth visit, some criteria may be missing or patient reported. Any vitals not documented were not able to be obtained and vitals that have been documented are patient reported.  Cardiac Risk Factors include: advanced age (>75men, >45 women);male gender;hypertension     Objective:    Today's Vitals   05/01/23 1110  Weight: (!) 306 lb (138.8 kg)  Height: 6\' 1"  (1.854 m)   Body mass index is 40.37 kg/m.     05/01/2023   11:56 AM 09/14/2022   10:19 AM 04/28/2022    9:24 AM 03/02/2021    9:20 AM 04/01/2019   10:21 AM 02/04/2019    9:00 AM  Advanced Directives  Does Patient Have a Medical Advance Directive? No Yes Yes Yes Yes Yes  Type of Best boy of [place];Living will Healthcare Power of [place];Living will  Healthcare Power of [place];Living will  Does patient want to make changes to medical advance directive?      No - Patient declined  Copy of Healthcare Power of Attorney in Chart?   No - copy requested No - copy requested  No - copy requested  Would patient like information on creating a medical advance directive? Yes (MAU/Ambulatory/Procedural Areas - Information given)         Current Medications (verified) Outpatient Encounter Medications as of 05/01/2023  Medication Sig   ALPRAZolam  (XANAX) 0.25 MG tablet Take 1 to 2 tablets as needed   escitalopram (LEXAPRO) 20 MG tablet TAKE ONE TABLET DAILY   lisinopril (ZESTRIL) 20 MG tablet Take 1 tablet (20 mg total) by mouth daily.   Multiple Vitamins-Minerals (MULTIVITAMIN WITH MINERALS) tablet Take 1 tablet by mouth daily.   No facility-administered encounter medications on file as of 05/01/2023.    Allergies (verified) Bee venom, Other, Short ragweed pollen ext, Zolpidem, and Ambien [zolpidem tartrate]   History: Past Medical History:  Diagnosis Date   Anxiety    BPH (benign prostatic hyperplasia)    Depression    Hyperlipidemia    Hypertension    Testosterone deficiency    Past Surgical History:  Procedure Laterality Date   FOOT SURGERY     HERNIA REPAIR Left inguinal   Family History  Problem Relation Age of Onset   Hypertension Mother    Alzheimer's disease Mother    Hypertension Father    Alzheimer's disease Father    Social History   Socioeconomic History   Marital status: Married    Spouse name: [name]   Number of children: 3   Years of education: 12   Highest education level: 12th grade  Occupational History   Occupation: retired    Comment: works a few hours a week  Tobacco Use   Smoking status: Former    Current packs/day: 0.00    Types: Cigarettes    Quit date: 1998    Years since quitting: 27.0   Smokeless tobacco:  Former    Types: Dorna Bloom    Quit date: 2002  Vaping Use   Vaping status: Never Used  Substance and Sexual Activity   Alcohol use: Yes    Alcohol/week: 14.0 standard drinks of alcohol    Types: 14 Cans of beer per week   Drug use: No   Sexual activity: Yes    Birth control/protection: None  Other Topics Concern   Not on file  Social History Narrative   3 daughters, 2 live in [place]. 6 grandchildren.   Was married x 33 years prior to first wife's death.   Remarried to [employer].   Social Drivers of Corporate investment banker Strain: Low Risk  (05/01/2023)   Overall  Financial Resource Strain (CARDIA)    Difficulty of Paying Living Expenses: Not hard at all  Food Insecurity: No Food Insecurity (05/01/2023)   Hunger Vital Sign    Worried About Running Out of Food in the Last Year: Never true    Ran Out of Food in the Last Year: Never true  Transportation Needs: No Transportation Needs (05/01/2023)   PRAPARE - Administrator, Civil Service (Medical): No    Lack of Transportation (Non-Medical): No  Physical Activity: Inactive (05/01/2023)   Exercise Vital Sign    Days of Exercise per Week: 0 days    Minutes of Exercise per Session: 0 min  Stress: No Stress Concern Present (05/01/2023)   [employer] of Occupational Health - Occupational Stress Questionnaire    Feeling of Stress : Only a little  Social Connections: Moderately Isolated (05/01/2023)   Social Connection and Isolation Panel [NHANES]    Frequency of Communication with Friends and Family: More than three times a week    Frequency of Social Gatherings with Friends and Family: Three times a week    Attends Religious Services: Never    Active Member of Clubs or Organizations: No    Attends Engineer, structural: Never    Marital Status: Married    Tobacco Counseling Counseling given: Not Answered   Clinical Intake:  Pre-visit preparation completed: Yes  Pain : No/denies pain     Diabetes: No  How often do you need to have someone help you when you read instructions, pamphlets, or other written materials from your doctor or pharmacy?: 1 - Never  Interpreter Needed?: No  Information entered by :: Arthur Fantasia LPN   Activities of Daily Living    05/01/2023   11:10 AM  In your present state of health, do you have any difficulty performing the following activities:  Hearing? 0  Vision? 0  Difficulty concentrating or making decisions? 0  Walking or climbing stairs? 0  Dressing or bathing? 0  Doing errands, shopping? 0  Preparing Food and eating ? N   Using the Toilet? N  In the past six months, have you accidently leaked urine? N  Do you have problems with loss of bowel control? N  Managing your Medications? N  Managing your Finances? N  Housekeeping or managing your Housekeeping? N    Patient Care Team: Arthur Masters, FNP as PCP - General (Family Medicine)  Indicate any recent Medical Services you may have received from other than [facility] providers in the past year (date may be approximate).     Assessment:   This is a routine wellness examination for Morell.  Hearing/Vision screen Hearing Screening - Comments:: Some hearing loss; denies problems  Vision Screening - Comments:: up to date with  routine eye exams with [facility] [place]     Goals Addressed   None   Depression Screen    05/01/2023   11:55 AM 04/24/2023    2:05 PM 11/10/2022    8:37 AM 09/05/2022    9:07 AM 06/29/2022    8:17 AM 05/11/2022    8:43 AM 04/28/2022    9:18 AM  PHQ 2/9 Scores  PHQ - 2 Score 0 0 0 0 0 0 0  PHQ- 9 Score 2 2 0 0 0 0     Fall Risk    05/01/2023   11:57 AM 04/24/2023    2:04 PM 11/10/2022    8:37 AM 09/05/2022    9:07 AM 06/29/2022    8:17 AM  Fall Risk   Falls in the past year? 0 0 0 1 1  Number falls in past yr: 0   1 0  Injury with Fall? 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, tripping hazards   Follow up Falls prevention discussed;Education provided;Falls evaluation completed Falls evaluation completed Falls evaluation completed Falls evaluation completed Falls evaluation completed    MEDICARE RISK AT HOME: Medicare Risk at Home Any stairs in or around the home?: No If so, are there any without handrails?: No Home free of loose throw rugs in walkways, pet beds, electrical cords, etc?: Yes Adequate lighting in your home to reduce risk of falls?: Yes Life alert?: No Use of a cane, walker or w/c?: No Grab bars in the  bathroom?: Yes Shower chair or bench in shower?: No Elevated toilet seat or a handicapped toilet?: Yes  TIMED UP AND GO:  Was the test performed?  No    Cognitive Function:    12/19/2012    4:04 PM  MMSE - Mini Mental State Exam  Orientation to time 5  Orientation to Place 5  Registration 3  Attention/ Calculation 5  Recall 2  Language- name 2 objects 2  Language- repeat 1  Language- follow 3 step command 3  Language- read & follow direction 1  Write a sentence 1  Copy design 1  Total score 29        05/01/2023   11:58 AM 04/28/2022    9:25 AM 03/02/2021    9:23 AM 02/04/2019    9:07 AM  6CIT Screen  What Year? 0 points 0 points 0 points 0 points  What month? 0 points 0 points 0 points 0 points  What time? 0 points 0 points 0 points 0 points  Count back from 20 0 points 0 points 0 points 0 points  Months in reverse 0 points 0 points 0 points 0 points  Repeat phrase 0 points 0 points 0 points 0 points  Total Score 0 points 0 points 0 points 0 points    Immunizations Immunization History  Administered Date(s) Administered   Fluad Quad(high Dose 65+) 12/21/2018, 01/11/2021   Influenza Whole 03/03/2012   Influenza,inj,Quad PF,6+ Mos 01/08/2014, 03/06/2016   Influenza-Unspecified 01/11/2018, 12/13/2018   Pneumococcal Conjugate-13 05/26/2019   Pneumococcal Polysaccharide-23 06/24/2020   Tdap 12/03/2011   Zoster Recombinant(Shingrix) 12/23/2018, 02/24/2019    TDAP status: Due, Education has been provided regarding the importance of this vaccine. Advised may receive this vaccine at local pharmacy or Health Dept. Aware to provide a copy of the vaccination record if obtained from local  pharmacy or Health Dept. Verbalized acceptance and understanding.  Flu Vaccine status: Declined, Education has been provided regarding the importance of this vaccine but patient still declined. Advised may receive this vaccine at local pharmacy or Health Dept. Aware to provide a copy of the  vaccination record if obtained from local pharmacy or Health Dept. Verbalized acceptance and understanding.  Pneumococcal vaccine status: Up to date  Covid-19 vaccine status: Information provided on how to obtain vaccines.   Qualifies for Shingles Vaccine? Yes   Zostavax completed No   Shingrix Completed?: Yes  Screening Tests Health Maintenance  Topic Date Due   Fecal DNA (Cologuard)  Never done   COVID-19 Vaccine (1) 05/10/2023 (Originally 01/08/1956)   Hepatitis C Screening  05/12/2023 (Originally [birth year]1971)   INFLUENZA VACCINE  07/02/2023 (Originally 11/02/2022)   DTaP/Tdap/Td (2 - Td or Tdap) 04/23/2024 (Originally 12/02/2021)   Medicare Annual Wellness (AWV)  04/30/2024   Pneumonia Vaccine 36+ Years old  Completed   Zoster Vaccines- Shingrix  Completed   HPV VACCINES  Aged Out   Colonoscopy  Discontinued    Health Maintenance  Health Maintenance Due  Topic Date Due   Fecal DNA (Cologuard)  Never done    Cologuard ordered 01/05/23 and patient states that he completed within the las week and returned   Lung Cancer Screening: (Low Dose CT Chest recommended if Age 73-80 years, 20 pack-year currently smoking OR have quit w/in 15years.) does not qualify.   Lung Cancer Screening Referral: n/a  Additional Screening:  Hepatitis C Screening: does qualify  Vision Screening: Recommended annual ophthalmology exams for early detection of glaucoma and other disorders of the eye. Is the patient up to date with their annual eye exam?  Yes  Who is the provider or what is the name of the office in which the patient attends annual eye exams? [facility]  If pt is not established with a provider, would they like to be referred to a provider to establish care? No .   Dental Screening: Recommended annual dental exams for proper oral hygiene  Community Resource Referral / Chronic Care Management: CRR required this visit?  No   CCM required this visit?  No     Plan:     I have  personally reviewed and noted the following in the patient's chart:   Medical and social history Use of alcohol, tobacco or illicit drugs  Current medications and supplements including opioid prescriptions. Patient is not currently taking opioid prescriptions. Functional ability and status Nutritional status Physical activity Advanced directives List of other physicians Hospitalizations, surgeries, and ER visits in previous 12 months Vitals Screenings to include cognitive, depression, and falls Referrals and appointments  In addition, I have reviewed and discussed with patient certain preventive protocols, quality metrics, and best practice recommendations. A written personalized care plan for preventive services as well as general preventive health recommendations were provided to patient.     Arthur Foster, [place]   1/61/0960   After Visit Summary: (MyChart) Due to this being a telephonic visit, the after visit summary with patients personalized plan was offered to patient via MyChart   Nurse Notes: No concerns at this time

## 2023-05-02 ENCOUNTER — Encounter: Payer: Self-pay | Admitting: Family Medicine

## 2023-05-02 ENCOUNTER — Other Ambulatory Visit: Payer: Self-pay | Admitting: Family Medicine

## 2023-05-02 DIAGNOSIS — R195 Other fecal abnormalities: Secondary | ICD-10-CM | POA: Insufficient documentation

## 2023-05-02 LAB — COLOGUARD: COLOGUARD: POSITIVE — AB

## 2023-05-03 ENCOUNTER — Ambulatory Visit: Payer: Medicare Other | Admitting: Physical Therapy

## 2023-05-03 DIAGNOSIS — M6281 Muscle weakness (generalized): Secondary | ICD-10-CM | POA: Diagnosis not present

## 2023-05-03 DIAGNOSIS — M542 Cervicalgia: Secondary | ICD-10-CM

## 2023-05-03 DIAGNOSIS — M62838 Other muscle spasm: Secondary | ICD-10-CM

## 2023-05-03 DIAGNOSIS — M5030 Other cervical disc degeneration, unspecified cervical region: Secondary | ICD-10-CM | POA: Diagnosis not present

## 2023-05-03 NOTE — Therapy (Signed)
OUTPATIENT PHYSICAL THERAPY CERVICAL TREATMENT  Patient Name: Arthur Foster MRN: [id number] DOB:June 17, 1950, 73 y.o., male Today's Date: 05/03/2023  END OF SESSION:  PT End of Session - 05/03/23 1014     Visit Number 2    Number of Visits 12    Date for PT Re-Evaluation 06/11/23    Authorization Type FOTO    PT Start Time 0802    PT Stop Time 0857    PT Time Calculation (min) 55 min    Activity Tolerance Patient tolerated treatment well    Behavior During Therapy WFL for tasks assessed/performed             Past Medical History:  Diagnosis Date   Anxiety    BPH (benign prostatic hyperplasia)    Depression    Hyperlipidemia    Hypertension    Testosterone deficiency    Past Surgical History:  Procedure Laterality Date   FOOT SURGERY     HERNIA REPAIR Left inguinal   Patient Active Problem List   Diagnosis Date Noted   Positive colorectal cancer screening using Cologuard test 05/02/2023   DDD (degenerative disc disease), cervical 12/15/2022   Prediabetes 05/11/2022   Controlled substance agreement signed 11/22/2018   Vitamin D deficiency 11/22/2018   Sensorineural hearing loss (SNHL), bilateral 06/17/2018   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  THERAPY DIAG:  Cervicalgia  Other muscle spasm  Rationale for Evaluation and Treatment: Rehabilitation  ONSET DATE: 02/2022.  SUBJECTIVE:                                                                                                                                                                                                          SUBJECTIVE STATEMENT: Got two days of relief from first treatment.  PERTINENT HISTORY:  HTN.  PAIN:  Are you having pain? Yes: NPRS scale: 4/10. Pain location: Left cervical. Pain description: Pins, needles, numb and ache. Aggravating factors: Raising arms up and increased UE use. Relieving factors: Arms hanging straight down.   PRECAUTIONS: None  RED FLAGS: None     WEIGHT BEARING RESTRICTIONS: No  FALLS:  Has patient  fallen in last 6 months? No  LIVING ENVIRONMENT: Lives with: lives with their spouse Lives in: House/apartment Has following equipment at home: None  OCCUPATION: Personnel officer.    PLOF: Independent  PATIENT GOALS: Be able to do job without pain and symptoms. Sleep.   OBJECTIVE:  Note: Objective measures were completed at Evaluation unless otherwise noted.  DIAGNOSTIC FINDINGS:  09/05/22:  There is no evidence of cervical spine fracture or prevertebral soft tissue swelling. Loss of normal cervical lordosis. Multilevel degenerate disc disease with disc height loss and anterior osteophytes prominent at C4-C5 and C5-C6.   IMPRESSION: Multilevel degenerative disc disease most prominent at C4-C5 and C5-C6.    POSTURE: rounded shoulders and forward head  PALPATION: Left UT tender to palpation at mid-point of muscle.   CERVICAL ROM:   Left active cervical rotation is 55 degrees and right is 60 degrees.  UPPER EXTREMITY ROM:  WNL.  UPPER EXTREMITY MMT:  Left shoulder ER strength is decreased to 4/5.  Right grip (dominant side) is 80# and left is 60#.  Left brachioradialis reflex is decreased per contralateral comparison.   TREATMENT DATE:   05/03/23:  [place] at 1.50 W/CM2 x 8 minutes to patient's left cervical region f/b  HMP and IFC at 80-150 Hz on 40% scan x 15 minutes to patient's left UT region f/b Intermittent cervical traction at  20# (99 sec hold and 5 sec rest) x 15 minutes.       PATIENT EDUCATION:  Education details:  Person educated:  International aid/development worker:  Education comprehension:   HOME EXERCISE PROGRAM:   ASSESSMENT:  CLINICAL IMPRESSION: Patient tolerated int cervical traction at 20# without complaint.  OBJECTIVE IMPAIRMENTS: decreased activity tolerance, decreased ROM, decreased strength, increased muscle spasms, postural dysfunction, and pain.   ACTIVITY LIMITATIONS: carrying, lifting, sleeping, and reach over head  PARTICIPATION LIMITATIONS: occupation  PERSONAL FACTORS: Time since onset of injury/illness/exacerbation are also affecting patient's functional outcome.   REHAB POTENTIAL: Good-/Good.  CLINICAL DECISION MAKING: Evolving/moderate complexity  EVALUATION COMPLEXITY: Low   GOALS:  LONG TERM GOALS: Target date: 06/11/23.  Ind with an HEP. Baseline:  Goal status: INITIAL  2.  Eliminate left UE symptoms.  Goal status: INITIAL  3.  Perform ADL's with pain not > 2-3/10.  Goal status: INITIAL  4.  Sleep undisturbed.  Goal status: INITIAL  PLAN:  PT FREQUENCY: 2x/week  PT DURATION: 6 weeks  PLANNED INTERVENTIONS: 97110-Therapeutic exercises, 97530- Therapeutic activity, [id number]- Neuromuscular re-education, 97535- Self Care, 08657- Manual therapy, 97014- Electrical stimulation (unattended), 97035- Ultrasound, 84696- Traction (mechanical), Patient/Family education, Dry Needling, Cryotherapy, and Moist heat  PLAN FOR NEXT SESSION: Combo e'stim/US at 1.50 W/CM2, STW/M, int cervical traction beginning at 18#.  Dry needling.  Postural exercises.     Aimar Borghi, [name], PT 05/03/2023, 10:31 AM

## 2023-05-07 ENCOUNTER — Ambulatory Visit: Payer: Medicare Other | Attending: Family Medicine | Admitting: Physical Therapy

## 2023-05-07 DIAGNOSIS — M542 Cervicalgia: Secondary | ICD-10-CM | POA: Insufficient documentation

## 2023-05-07 DIAGNOSIS — M62838 Other muscle spasm: Secondary | ICD-10-CM | POA: Insufficient documentation

## 2023-05-07 NOTE — Therapy (Signed)
OUTPATIENT PHYSICAL THERAPY CERVICAL TREATMENT  Patient Name: Arthur Foster MRN: [id number] DOB:Mar 06, 1951, 73 y.o., male Today's Date: 05/07/2023  END OF SESSION:  PT End of Session - 05/07/23 1003     Visit Number 3    Number of Visits 12    Date for PT Re-Evaluation 06/11/23    Authorization Type FOTO    PT Start Time 0802    PT Stop Time 0848    PT Time Calculation (min) 46 min              Past Medical History:  Diagnosis Date   Anxiety    BPH (benign prostatic hyperplasia)    Depression    Hyperlipidemia    Hypertension    Testosterone deficiency    Past Surgical History:  Procedure Laterality Date   FOOT SURGERY     HERNIA REPAIR Left inguinal   Patient Active Problem List   Diagnosis Date Noted   Positive colorectal cancer screening using Cologuard test 05/02/2023   DDD (degenerative disc disease), cervical 12/15/2022   Prediabetes 05/11/2022   Controlled substance agreement signed 11/22/2018   Vitamin D deficiency 11/22/2018   Sensorineural hearing loss (SNHL), bilateral 06/17/2018   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  THERAPY DIAG:  Cervicalgia  Other muscle spasm  Rationale for Evaluation and Treatment: Rehabilitation  ONSET DATE: 02/2022.  SUBJECTIVE:                                                                                                                                                                                                          SUBJECTIVE STATEMENT: Slept real good last night.  Been doing the the towel stretch.    PERTINENT HISTORY:  HTN.  PAIN:  Are you having pain? Yes: NPRS scale: 2-3/10. Pain location: Left cervical. Pain description: Pins, needles, numb and ache. Aggravating factors: Raising arms up and increased UE use. Relieving factors: Arms hanging straight down.   PRECAUTIONS: None  RED FLAGS: None     WEIGHT BEARING RESTRICTIONS: No  FALLS:  Has patient fallen in last 6 months? No  LIVING ENVIRONMENT: Lives with: lives  with their spouse Lives in: House/apartment Has following equipment at home: None  OCCUPATION: Personnel officer.    PLOF: Independent  PATIENT GOALS: Be able to do job without pain and symptoms. Sleep.   OBJECTIVE:  Note: Objective measures were completed at Evaluation unless otherwise noted.  DIAGNOSTIC FINDINGS:  09/05/22:  There is no evidence of cervical spine fracture or prevertebral soft tissue swelling. Loss of normal cervical lordosis. Multilevel degenerate disc disease with disc height loss and anterior osteophytes prominent at C4-C5 and C5-C6.   IMPRESSION: Multilevel degenerative disc disease most prominent at C4-C5 and C5-C6.    POSTURE: rounded shoulders and forward head  PALPATION: Left UT tender to palpation at mid-point of muscle.   CERVICAL ROM:   Left active cervical rotation is 55 degrees and right is 60 degrees.  UPPER EXTREMITY ROM:  WNL.  UPPER EXTREMITY MMT:  Left shoulder ER strength is decreased to 4/5.  Right grip (dominant side) is 80# and left is 60#.  Left brachioradialis reflex is decreased per contralateral comparison.   TREATMENT DATE:    05/07/23:  HMP and IFC at 80-150 Hz on 40% scan x 15 minutes to patient's left UT region f/b STW/M x 8 minutes f/b Intermittent cervical traction at 23# (99 sec hold and 5 sec rest) x 15 minutes.        05/03/23:  [place] at 1.50 W/CM2 x 8 minutes to patient's left cervical region f/b   HMP and IFC at 80-150 Hz on 40% scan x 15 minutes to patient's left UT region f/b Intermittent cervical traction at 20# (99 sec hold and 5 sec rest) x 15 minutes.       PATIENT EDUCATION:  Education details:  Person educated:  International aid/development worker:  Education comprehension:   HOME EXERCISE PROGRAM:   ASSESSMENT:  CLINICAL IMPRESSION: Patient has been doing the cervical extension facilitated with towel over the weekend and reporting sleeping good.    OBJECTIVE IMPAIRMENTS: decreased activity tolerance, decreased ROM, decreased strength, increased muscle spasms, postural dysfunction, and pain.   ACTIVITY LIMITATIONS: carrying, lifting, sleeping, and reach over head  PARTICIPATION LIMITATIONS: occupation  PERSONAL FACTORS: Time since onset of injury/illness/exacerbation are also affecting patient's functional outcome.   REHAB POTENTIAL: Good-/Good.  CLINICAL DECISION MAKING: Evolving/moderate complexity  EVALUATION COMPLEXITY: Low   GOALS:  LONG TERM GOALS: Target date: 06/11/23.  Ind with an HEP. Baseline:  Goal status: INITIAL  2.  Eliminate left UE symptoms.  Goal status: INITIAL  3.  Perform ADL's with pain not > 2-3/10.  Goal status: INITIAL  4.  Sleep undisturbed.  Goal status: INITIAL  PLAN:  PT FREQUENCY: 2x/week  PT DURATION: 6 weeks  PLANNED INTERVENTIONS: 97110-Therapeutic exercises, 97530- Therapeutic activity, [id number]- Neuromuscular re-education, 97535- Self Care, 65784- Manual therapy, 97014- Electrical stimulation (unattended), 97035- Ultrasound, 69629- Traction (mechanical), Patient/Family education, Dry Needling, Cryotherapy, and Moist heat  PLAN FOR NEXT SESSION: Combo e'stim/US at 1.50 W/CM2, STW/M, int cervical traction beginning at 18#.  Dry needling.  Postural exercises.     Hridhaan Yohn, [name], PT 05/07/2023, 10:06 AM

## 2023-05-10 ENCOUNTER — Ambulatory Visit: Payer: Medicare Other | Admitting: *Deleted

## 2023-05-10 ENCOUNTER — Encounter: Payer: Self-pay | Admitting: *Deleted

## 2023-05-10 DIAGNOSIS — M542 Cervicalgia: Secondary | ICD-10-CM

## 2023-05-10 DIAGNOSIS — M62838 Other muscle spasm: Secondary | ICD-10-CM | POA: Diagnosis not present

## 2023-05-10 NOTE — Therapy (Signed)
 OUTPATIENT PHYSICAL THERAPY CERVICAL TREATMENT  Patient Name: Arthur Foster MRN: [id number] DOB:1950-08-24, 73 y.o., male Today's Date: 05/10/2023  END OF SESSION:  PT End of Session - 05/10/23 0800     Visit Number 4    Number of Visits 12    Date for PT Re-Evaluation 06/11/23    PT Start Time 0800    PT Stop Time 0853    PT Time Calculation (min) 53 min              Past Medical History:  Diagnosis Date   Anxiety    BPH (benign prostatic hyperplasia)    Depression    Hyperlipidemia    Hypertension    Testosterone  deficiency    Past Surgical History:  Procedure Laterality Date   FOOT SURGERY     HERNIA REPAIR Left inguinal   Patient Active Problem List   Diagnosis Date Noted   Positive colorectal cancer screening using Cologuard test 05/02/2023   DDD (degenerative disc disease), cervical 12/15/2022   Prediabetes 05/11/2022   Controlled substance agreement signed 11/22/2018   Vitamin D  deficiency 11/22/2018   Sensorineural hearing loss (SNHL), bilateral 06/17/2018   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   Mixed dyslipidemia 08/08/2017   Abnormal EKG 08/08/2017   Cervical somatic dysfunction 03/06/2016   Generalized anxiety disorder 03/06/2016   Allergic rhinitis 06/18/2013   Morbid obesity (HCC) 06/18/2013   Benign prostatic hyperplasia 07/03/2012   Essential hypertension, benign 07/03/2012   Depression, recurrent (HCC) 07/03/2012   GERD (gastroesophageal reflux disease) 07/03/2012   Testosterone  deficiency 07/03/2012   REFERRING PROVIDER: Rock Bruns  REFERRING DIAG: DDD, cervical.  THERAPY DIAG:  Cervicalgia  Rationale for Evaluation and Treatment: Rehabilitation  ONSET DATE: 02/2022.  SUBJECTIVE:                                                                                                                                                                                                         SUBJECTIVE STATEMENT: Didn't do as well after last  Rx. Would rather skip the massage and do the US  today.  Been doing the the towel stretch.    PERTINENT HISTORY:  HTN.  PAIN:  Are you having pain? Yes: NPRS scale: 3/10. Pain location: Left cervical. Pain description: Pins, needles, numb and ache. Aggravating factors: Raising arms up and increased UE use. Relieving factors: Arms hanging straight down.   PRECAUTIONS: None  RED FLAGS: None     WEIGHT BEARING RESTRICTIONS: No  FALLS:  Has patient fallen in last 6 months? No  LIVING ENVIRONMENT: Lives  with: lives with their spouse Lives in: House/apartment Has following equipment at home: None  OCCUPATION: Personnel Officer.    PLOF: Independent  PATIENT GOALS: Be able to do job without pain and symptoms. Sleep.   OBJECTIVE:  Note: Objective measures were completed at Evaluation unless otherwise noted.  DIAGNOSTIC FINDINGS:  09/05/22:  There is no evidence of cervical spine fracture or prevertebral soft tissue swelling. Loss of normal cervical lordosis. Multilevel degenerate disc disease with disc height loss and anterior osteophytes prominent at C4-C5 and C5-C6.   IMPRESSION: Multilevel degenerative disc disease most prominent at C4-C5 and C5-C6.    POSTURE: rounded shoulders and forward head  PALPATION: Left UT tender to palpation at mid-point of muscle.   CERVICAL ROM:   Left active cervical rotation is 55 degrees and right is 60 degrees.  UPPER EXTREMITY ROM:  WNL.  UPPER EXTREMITY MMT:  Left shoulder ER strength is decreased to 4/5.  Right grip (dominant side) is 80# and left is 60#.  Left brachioradialis reflex is decreased per contralateral comparison.   TREATMENT DATE:    05/10/23: US  at 1.50 W/CM2 x 10 minutes to patient's left cervical region  HMP and IFC at 80-150 Hz on 40% scan x 15 minutes to patient's left UT region   Intermittent cervical traction at 23# (99 sec hold and 5 sec rest) x 15 minutes.        05/03/23:  US  at 1.50 W/CM2 x 8  minutes to patient's left cervical region f/b  HMP and IFC at 80-150 Hz on 40% scan x 15 minutes to patient's left UT region f/b Intermittent cervical traction at 20# (99 sec hold and 5 sec rest) x 15 minutes.       PATIENT EDUCATION:  Education details:  Person educated:  International aid/development worker:  Education comprehension:   HOME EXERCISE PROGRAM:   ASSESSMENT:  CLINICAL IMPRESSION: Pt. Arrived doing fairly well with cervical pain, but requested to have US  combo today instead of STW due to soreness.Patient then received HMP/ IFC f/b traction at 23#'s again and did great. He reports he has  been doing the cervical extension facilitated with towel and feels it is helping.    OBJECTIVE IMPAIRMENTS: decreased activity tolerance, decreased ROM, decreased strength, increased muscle spasms, postural dysfunction, and pain.   ACTIVITY LIMITATIONS: carrying, lifting, sleeping, and reach over head  PARTICIPATION LIMITATIONS: occupation  PERSONAL FACTORS: Time since onset of injury/illness/exacerbation are also affecting patient's functional outcome.   REHAB POTENTIAL: Good-/Good.  CLINICAL DECISION MAKING: Evolving/moderate complexity  EVALUATION COMPLEXITY: Low   GOALS:  LONG TERM GOALS: Target date: 06/11/23.  Ind with an HEP. Baseline:  Goal status: Partially met  2.  Eliminate left UE symptoms.  Goal status: On going  3.  Perform ADL's with pain not > 2-3/10.  Goal status: On going  4.  Sleep undisturbed.  Goal status:  On going  PLAN:  PT FREQUENCY: 2x/week  PT DURATION: 6 weeks  PLANNED INTERVENTIONS: 97110-Therapeutic exercises, 97530- Therapeutic activity, [id number]- Neuromuscular re-education, 97535- Self Care, [id number]- Manual therapy, 97014- Electrical stimulation (unattended), 97035- Ultrasound, 02987- Traction (mechanical), Patient/Family education, Dry Needling, Cryotherapy, and Moist heat  PLAN FOR NEXT SESSION: Combo e'stim/US  at 1.50 W/CM2, STW/M, int cervical  traction and  Dry needling.  Postural exercises.     Marx Doig,CHRIS, PTA 05/10/2023, 9:37 AM

## 2023-05-14 ENCOUNTER — Ambulatory Visit: Payer: Medicare Other | Admitting: *Deleted

## 2023-05-14 ENCOUNTER — Encounter: Payer: Self-pay | Admitting: *Deleted

## 2023-05-14 DIAGNOSIS — M62838 Other muscle spasm: Secondary | ICD-10-CM

## 2023-05-14 DIAGNOSIS — M542 Cervicalgia: Secondary | ICD-10-CM

## 2023-05-14 NOTE — Therapy (Signed)
 OUTPATIENT PHYSICAL THERAPY CERVICAL TREATMENT  Patient Name: Arthur Foster MRN: [id number] DOB:08-01-1950, 73 y.o., male Today's Date: 05/14/2023  END OF SESSION:  PT End of Session - 05/14/23 0800     Visit Number 5    Number of Visits 12    Date for PT Re-Evaluation 06/11/23    Authorization Type FOTO    PT Start Time 0800    PT Stop Time 0850    PT Time Calculation (min) 50 min              Past Medical History:  Diagnosis Date   Anxiety    BPH (benign prostatic hyperplasia)    Depression    Hyperlipidemia    Hypertension    Testosterone  deficiency    Past Surgical History:  Procedure Laterality Date   FOOT SURGERY     HERNIA REPAIR Left inguinal   Patient Active Problem List   Diagnosis Date Noted   Positive colorectal cancer screening using Cologuard test 05/02/2023   DDD (degenerative disc disease), cervical 12/15/2022   Prediabetes 05/11/2022   Controlled substance agreement signed 11/22/2018   Vitamin D  deficiency 11/22/2018   Sensorineural hearing loss (SNHL), bilateral 06/17/2018   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   Mixed dyslipidemia 08/08/2017   Abnormal EKG 08/08/2017   Cervical somatic dysfunction 03/06/2016   Generalized anxiety disorder 03/06/2016   Allergic rhinitis 06/18/2013   Morbid obesity (HCC) 06/18/2013   Benign prostatic hyperplasia 07/03/2012   Essential hypertension, benign 07/03/2012   Depression, recurrent (HCC) 07/03/2012   GERD (gastroesophageal reflux disease) 07/03/2012   Testosterone  deficiency 07/03/2012   REFERRING PROVIDER: Evalyn Hillier  REFERRING DIAG: DDD, cervical.  THERAPY DIAG:  Cervicalgia  Other muscle spasm  Rationale for Evaluation and Treatment: Rehabilitation  ONSET DATE: 02/2022.  SUBJECTIVE:                                                                                                                                                                                                          SUBJECTIVE STATEMENT: Didn't do as well after last visit. Not sure if from traction or over doing the exs PERTINENT HISTORY:  HTN.  PAIN:  Are you having pain? Yes: NPRS scale: 3-4/10. Pain location: Left cervical. Pain description: Pins, needles, numb and ache. Aggravating factors: Raising arms up and increased UE use. Relieving factors: Arms hanging straight down.   PRECAUTIONS: None  RED FLAGS: None     WEIGHT BEARING RESTRICTIONS: No  FALLS:  Has patient fallen in last 6 months? No  LIVING ENVIRONMENT: Lives  with: lives with their spouse Lives in: House/apartment Has following equipment at home: None  OCCUPATION: Personnel officer.    PLOF: Independent  PATIENT GOALS: Be able to do job without pain and symptoms. Sleep.   OBJECTIVE:  Note: Objective measures were completed at Evaluation unless otherwise noted.  DIAGNOSTIC FINDINGS:  09/05/22:  There is no evidence of cervical spine fracture or prevertebral soft tissue swelling. Loss of normal cervical lordosis. Multilevel degenerate disc disease with disc height loss and anterior osteophytes prominent at C4-C5 and C5-C6.   IMPRESSION: Multilevel degenerative disc disease most prominent at C4-C5 and C5-C6.    POSTURE: rounded shoulders and forward head  PALPATION: Left UT tender to palpation at mid-point of muscle.   CERVICAL ROM:   Left active cervical rotation is 55 degrees and right is 60 degrees.  UPPER EXTREMITY ROM:  WNL.  UPPER EXTREMITY MMT:  Left shoulder ER strength is decreased to 4/5.  Right grip (dominant side) is 80# and left is 60#.  Left brachioradialis reflex is decreased per contralateral comparison.   TREATMENT DATE:    05/14/23: US  at 1.50 W/CM2 x 10 minutes to patient's left cervical region  HMP and IFC at 80-150 Hz on 40% scan x 15 minutes to patient's left UT region   Intermittent cervical traction at 20# (99 sec hold and 5 sec rest) x 15 minutes.        05/03/23:  US  at  1.50 W/CM2 x 8 minutes to patient's left cervical region f/b  HMP and IFC at 80-150 Hz on 40% scan x 15 minutes to patient's left UT region f/b Intermittent cervical traction at 20# (99 sec hold and 5 sec rest) x 15 minutes.       PATIENT EDUCATION:  Education details:  Person educated:  International aid/development worker:  Education comprehension:   HOME EXERCISE PROGRAM:   ASSESSMENT:  CLINICAL IMPRESSION: Pt. Arrived doing fairly well with cervical pain, but reports pain increased that night.  He isn't sure if it was from PT or over doing his exs at home. Performed same Rx , but reduced traction to 20#'s and less intensity on HEP. Pt felt better after session.    OBJECTIVE IMPAIRMENTS: decreased activity tolerance, decreased ROM, decreased strength, increased muscle spasms, postural dysfunction, and pain.   ACTIVITY LIMITATIONS: carrying, lifting, sleeping, and reach over head  PARTICIPATION LIMITATIONS: occupation  PERSONAL FACTORS: Time since onset of injury/illness/exacerbation are also affecting patient's functional outcome.   REHAB POTENTIAL: Good-/Good.  CLINICAL DECISION MAKING: Evolving/moderate complexity  EVALUATION COMPLEXITY: Low   GOALS:  LONG TERM GOALS: Target date: 06/11/23.  Ind with an HEP. Baseline:  Goal status: Partially met  2.  Eliminate left UE symptoms.  Goal status: On going  3.  Perform ADL's with pain not > 2-3/10.  Goal status: On going  4.  Sleep undisturbed.  Goal status:  On going  PLAN:  PT FREQUENCY: 2x/week  PT DURATION: 6 weeks  PLANNED INTERVENTIONS: 97110-Therapeutic exercises, 97530- Therapeutic activity, [id number]- Neuromuscular re-education, 97535- Self Care, 09811- Manual therapy, 97014- Electrical stimulation (unattended), 97035- Ultrasound, 91478- Traction (mechanical), Patient/Family education, Dry Needling, Cryotherapy, and Moist heat  PLAN FOR NEXT SESSION: Combo e'stim/US  at 1.50 W/CM2, STW/M, int cervical traction and  Dry  needling.  Postural exercises.     Torian Thoennes,CHRIS, PTA 05/14/2023, 9:31 AM

## 2023-05-15 ENCOUNTER — Ambulatory Visit: Payer: Medicare Other | Admitting: Family Medicine

## 2023-05-17 ENCOUNTER — Ambulatory Visit: Payer: Medicare Other | Admitting: Physical Therapy

## 2023-05-17 ENCOUNTER — Encounter: Payer: Self-pay | Admitting: Physical Therapy

## 2023-05-17 DIAGNOSIS — M62838 Other muscle spasm: Secondary | ICD-10-CM

## 2023-05-17 DIAGNOSIS — M542 Cervicalgia: Secondary | ICD-10-CM | POA: Diagnosis not present

## 2023-05-17 NOTE — Therapy (Signed)
OUTPATIENT PHYSICAL THERAPY CERVICAL TREATMENT  Patient Name: Arthur Foster MRN: [id number] DOB:06-06-1950, 73 y.o., male Today's Date: 05/17/2023  END OF SESSION:  PT End of Session - 05/17/23 0838     Visit Number 6    Number of Visits 12    Date for PT Re-Evaluation 06/11/23    PT Start Time 0758    PT Stop Time 0855    PT Time Calculation (min) 57 min    Activity Tolerance Patient tolerated treatment well    Behavior During Therapy [facility] for tasks assessed/performed              Past Medical History:  Diagnosis Date   Anxiety    BPH (benign prostatic hyperplasia)    Depression    Hyperlipidemia    Hypertension    Testosterone deficiency    Past Surgical History:  Procedure Laterality Date   FOOT SURGERY     HERNIA REPAIR Left inguinal   Patient Active Problem List   Diagnosis Date Noted   Positive colorectal cancer screening using Cologuard test 05/02/2023   DDD (degenerative disc disease), cervical 12/15/2022   Prediabetes 05/11/2022   Controlled substance agreement signed 11/22/2018   Vitamin D deficiency 11/22/2018   Sensorineural hearing loss (SNHL), bilateral 06/17/2018   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  THERAPY DIAG:  Cervicalgia  Other muscle spasm  Rationale for Evaluation and Treatment: Rehabilitation  ONSET DATE: 02/2022.  SUBJECTIVE:                                                                                                                                                                                                          SUBJECTIVE STATEMENT: Slept good last night.  Used Voltaren gel PERTINENT HISTORY:  HTN.  PAIN:  Are you having pain? Yes: NPRS scale: 3-4/10. Pain location: Left cervical. Pain description: Pins, needles, numb and ache. Aggravating factors: Raising arms up and increased UE use. Relieving factors: Arms hanging straight down.   PRECAUTIONS: None  RED FLAGS: None     WEIGHT BEARING RESTRICTIONS: No  FALLS:  Has patient fallen in last 6 months? No  LIVING ENVIRONMENT: Lives with: lives with their spouse Lives in: House/apartment Has following equipment at home: None  OCCUPATION: Personnel officer.    PLOF: Independent  PATIENT GOALS: Be able to do job without pain and symptoms. Sleep.   OBJECTIVE:  Note: Objective measures were completed at Evaluation unless otherwise noted.  DIAGNOSTIC FINDINGS:  09/05/22:  There is no evidence of cervical spine fracture or prevertebral soft tissue swelling. Loss of normal cervical lordosis. Multilevel degenerate disc disease with disc height loss and anterior osteophytes prominent at C4-C5 and C5-C6.   IMPRESSION: Multilevel degenerative disc disease most prominent at C4-C5 and C5-C6.    POSTURE: rounded shoulders and forward head  PALPATION: Left UT tender to palpation at mid-point of muscle.   CERVICAL ROM:   Left active cervical rotation is 55 degrees and right is 60 degrees.  UPPER EXTREMITY ROM:  WNL.  UPPER EXTREMITY MMT:  Left shoulder ER strength is decreased to 4/5.  Right grip (dominant side) is 80# and left is 60#.  Left brachioradialis reflex is decreased per contralateral comparison.   TREATMENT DATE:   05/17/23:  Combo e'stim/US at 1.50 W/CM2 x 7 minutes to patient's left cervical musculature f/b STW/M x 16 minutes including ischemic release technique f/b HMP and IFC at 80-150 Hz on 40% scan x 20 minutes.  Normal modality  response following removal of modality.    05/14/23: [place] at 1.50 W/CM2 x 10 minutes to patient's left cervical region  HMP and IFC at 80-150 Hz on 40% scan x 15 minutes to patient's left UT region   Intermittent cervical traction at 20# (99 sec hold and 5 sec rest) x 15 minutes.          PATIENT EDUCATION:  Education details:  Person educated:  International aid/development worker:  Education comprehension:   HOME EXERCISE PROGRAM:   ASSESSMENT:  CLINICAL IMPRESSION: Patient had a great night sleep.  He did very well with treatment today and felt good afterwards.     OBJECTIVE IMPAIRMENTS: decreased activity tolerance, decreased ROM, decreased strength, increased muscle spasms, postural dysfunction, and pain.   ACTIVITY LIMITATIONS: carrying, lifting, sleeping, and reach over head  PARTICIPATION LIMITATIONS: occupation  PERSONAL FACTORS: Time since onset of injury/illness/exacerbation are also affecting patient's functional outcome.   REHAB POTENTIAL: Good-/Good.  CLINICAL DECISION MAKING: Evolving/moderate complexity  EVALUATION COMPLEXITY: Low   GOALS:  LONG TERM GOALS: Target date: 06/11/23.  Ind with an HEP. Baseline:  Goal status: Partially met  2.  Eliminate left UE symptoms.  Goal status: On going  3.  Perform ADL's with pain not > 2-3/10.  Goal status: On going  4.  Sleep undisturbed.  Goal status:  On going  PLAN:  PT FREQUENCY: 2x/week  PT DURATION: 6 weeks  PLANNED INTERVENTIONS: 97110-Therapeutic exercises, 97530- Therapeutic activity, [id number]- Neuromuscular re-education, 97535- Self Care, 16109- Manual therapy, 97014- Electrical stimulation (unattended), 97035- Ultrasound, 60454- Traction (mechanical), Patient/Family education, Dry Needling, Cryotherapy, and Moist heat  PLAN FOR NEXT SESSION: Combo e'stim/US at 1.50 W/CM2, STW/M, int cervical traction and  Dry needling.  Postural exercises.     Wally Shevchenko, [name], PT 05/17/2023, 9:10 AM

## 2023-05-21 ENCOUNTER — Ambulatory Visit: Payer: Medicare Other | Admitting: Physical Therapy

## 2023-05-21 DIAGNOSIS — M62838 Other muscle spasm: Secondary | ICD-10-CM

## 2023-05-21 DIAGNOSIS — M542 Cervicalgia: Secondary | ICD-10-CM

## 2023-05-21 NOTE — Therapy (Signed)
 OUTPATIENT PHYSICAL THERAPY CERVICAL TREATMENT  Patient Name: Arthur Foster MRN: [id number] DOB:30-Aug-1950, 73 y.o., male Today's Date: 05/21/2023  END OF SESSION:  PT End of Session - 05/21/23 0845     Visit Number 7    Number of Visits 12    Date for PT Re-Evaluation 06/11/23    PT Start Time 0759    PT Stop Time 0849    PT Time Calculation (min) 50 min    Activity Tolerance Patient tolerated treatment well    Behavior During Therapy [facility] for tasks assessed/performed              Past Medical History:  Diagnosis Date   Anxiety    BPH (benign prostatic hyperplasia)    Depression    Hyperlipidemia    Hypertension    Testosterone deficiency    Past Surgical History:  Procedure Laterality Date   FOOT SURGERY     HERNIA REPAIR Left inguinal   Patient Active Problem List   Diagnosis Date Noted   Positive colorectal cancer screening using Cologuard test 05/02/2023   DDD (degenerative disc disease), cervical 12/15/2022   Prediabetes 05/11/2022   Controlled substance agreement signed 11/22/2018   Vitamin D deficiency 11/22/2018   Sensorineural hearing loss (SNHL), bilateral 06/17/2018   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  THERAPY DIAG:  Cervicalgia  Other muscle spasm  Rationale for Evaluation and Treatment: Rehabilitation  ONSET DATE: 02/2022.  SUBJECTIVE:                                                                                                                                                                                                          SUBJECTIVE STATEMENT: Slept good last night.  Used Voltaren gel PERTINENT HISTORY:  HTN.  PAIN:  Are you having pain? Yes: NPRS scale: 3-4/10. Pain location: Left cervical. Pain description: Pins, needles, numb and ache. Aggravating factors: Raising arms up and increased UE use. Relieving factors: Arms hanging straight down.   PRECAUTIONS: None  RED FLAGS: None     WEIGHT BEARING RESTRICTIONS: No  FALLS:  Has patient fallen in last 6 months? No  LIVING ENVIRONMENT: Lives with: lives with their spouse Lives in: House/apartment Has following equipment at home: None  OCCUPATION: Personnel officer.    PLOF: Independent  PATIENT GOALS: Be able to do job without pain and symptoms. Sleep.   OBJECTIVE:  Note: Objective measures were completed at Evaluation unless otherwise noted.  DIAGNOSTIC FINDINGS:  09/05/22:  There is no evidence of cervical spine fracture or prevertebral soft tissue swelling. Loss of normal cervical lordosis. Multilevel degenerate disc disease with disc height loss and anterior osteophytes prominent at C4-C5 and C5-C6.   IMPRESSION: Multilevel degenerative disc disease most prominent at C4-C5 and C5-C6.    POSTURE: rounded shoulders and forward head  PALPATION: Left UT tender to palpation at mid-point of muscle.   CERVICAL ROM:   Left active cervical rotation is 55 degrees and right is 60 degrees.  UPPER EXTREMITY ROM:  WNL.  UPPER EXTREMITY MMT:  Left shoulder ER strength is decreased to 4/5.  Right grip (dominant side) is 80# and left is 60#.  Left brachioradialis reflex is decreased per contralateral comparison.   TREATMENT DATE:   05/21/23:  [place] at 1.50 W/CM2 x 12 minutes to patient's left cervical musculature f/b STW/M x 11 minutes including ischemic release technique f/b HMP and IFC at 80-150 Hz on 40% scan x 20 minutes.  Normal modality response  following removal of modality.    05/17/23:  Combo e'stim/US at 1.50 W/CM2 x 7 minutes to patient's left cervical musculature f/b STW/M x 16 minutes including ischemic release technique f/b HMP and IFC at 80-150 Hz on 40% scan x 20 minutes.  Normal modality response following removal of modality.    05/14/23: [place] at 1.50 W/CM2 x 10 minutes to patient's left cervical region  HMP and IFC at 80-150 Hz on 40% scan x 15 minutes to patient's left UT region   Intermittent cervical traction at 20# (99 sec hold and 5 sec rest) x 15 minutes.          PATIENT EDUCATION:  Education details:  Person educated:  International aid/development worker:  Education comprehension:   HOME EXERCISE PROGRAM:   ASSESSMENT:  CLINICAL IMPRESSION: Sleep better and pain has been consistently low.     OBJECTIVE IMPAIRMENTS: decreased activity tolerance, decreased ROM, decreased strength, increased muscle spasms, postural dysfunction, and pain.   ACTIVITY LIMITATIONS: carrying, lifting, sleeping, and reach over head  PARTICIPATION LIMITATIONS: occupation  PERSONAL FACTORS: Time since onset of injury/illness/exacerbation are also affecting patient's functional outcome.   REHAB POTENTIAL: Good-/Good.  CLINICAL DECISION MAKING: Evolving/moderate complexity  EVALUATION COMPLEXITY: Low   GOALS:  LONG TERM GOALS: Target date: 06/11/23.  Ind with an HEP. Baseline:  Goal status: Partially met  2.  Eliminate left UE symptoms.  Goal status: On going  3.  Perform ADL's with pain not > 2-3/10.  Goal status: On going  4.  Sleep undisturbed.  Goal status:  On going  PLAN:  PT FREQUENCY: 2x/week  PT DURATION: 6 weeks  PLANNED INTERVENTIONS: 97110-Therapeutic exercises, 97530- Therapeutic activity, [id number]- Neuromuscular re-education, 97535- Self Care, [id number]- Manual therapy, 97014- Electrical stimulation (unattended), 97035- Ultrasound, 19147- Traction (mechanical), Patient/Family education, Dry Needling, Cryotherapy,  and Moist heat  PLAN FOR NEXT SESSION: Combo e'stim/US at 1.50 W/CM2, STW/M, int cervical traction and  Dry needling.  Postural exercises.     Dlisa Barnwell, [name], PT 05/21/2023, 9:16 AM

## 2023-05-23 DIAGNOSIS — Z79899 Other long term (current) drug therapy: Secondary | ICD-10-CM | POA: Diagnosis not present

## 2023-05-24 ENCOUNTER — Ambulatory Visit: Payer: Medicare Other | Admitting: Physical Therapy

## 2023-05-24 DIAGNOSIS — M542 Cervicalgia: Secondary | ICD-10-CM

## 2023-05-24 DIAGNOSIS — M62838 Other muscle spasm: Secondary | ICD-10-CM | POA: Diagnosis not present

## 2023-05-24 NOTE — Therapy (Signed)
 OUTPATIENT PHYSICAL THERAPY CERVICAL TREATMENT  Patient Name: Arthur Foster MRN: [id number] DOB:01-Aug-1950, 73 y.o., male Today's Date: 05/24/2023  END OF SESSION:  PT End of Session - 05/24/23 0934     Visit Number 8    Number of Visits 12    Date for PT Re-Evaluation 06/11/23    Authorization Type FOTO    PT Start Time 0802    PT Stop Time 0857    PT Time Calculation (min) 55 min    Activity Tolerance Patient tolerated treatment well    Behavior During Therapy WFL for tasks assessed/performed              Past Medical History:  Diagnosis Date   Anxiety    BPH (benign prostatic hyperplasia)    Depression    Hyperlipidemia    Hypertension    Testosterone deficiency    Past Surgical History:  Procedure Laterality Date   FOOT SURGERY     HERNIA REPAIR Left inguinal   Patient Active Problem List   Diagnosis Date Noted   Positive colorectal cancer screening using Cologuard test 05/02/2023   DDD (degenerative disc disease), cervical 12/15/2022   Prediabetes 05/11/2022   Controlled substance agreement signed 11/22/2018   Vitamin D deficiency 11/22/2018   Sensorineural hearing loss (SNHL), bilateral 06/17/2018   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  THERAPY DIAG:  Cervicalgia  Other muscle spasm  Rationale for Evaluation and Treatment: Rehabilitation  ONSET DATE: 02/2022.  SUBJECTIVE:                                                                                                                                                                                                          SUBJECTIVE STATEMENT: Doing good PERTINENT HISTORY:  HTN.  PAIN:  Are you having pain? Yes: NPRS scale: 2/10. Pain location: Left cervical. Pain description: Pins, needles, numb and ache. Aggravating factors: Raising arms up and increased UE use. Relieving factors: Arms hanging straight down.   PRECAUTIONS: None  RED FLAGS: None     WEIGHT BEARING RESTRICTIONS: No  FALLS:  Has patient fallen in last 6 months? No  LIVING ENVIRONMENT: Lives with: lives with their spouse Lives in: House/apartment Has following equipment at home: None  OCCUPATION: Personnel officer.    PLOF: Independent  PATIENT GOALS: Be able to do job without pain and symptoms. Sleep.   OBJECTIVE:  Note: Objective measures were completed at Evaluation unless otherwise noted.  DIAGNOSTIC FINDINGS:  09/05/22:  There is no evidence of cervical spine fracture or prevertebral soft tissue swelling. Loss of normal cervical lordosis. Multilevel degenerate disc disease with disc height loss and anterior osteophytes prominent at C4-C5 and C5-C6.   IMPRESSION: Multilevel degenerative disc disease most prominent at C4-C5 and C5-C6.    POSTURE: rounded shoulders and forward head  PALPATION: Left UT tender to palpation at mid-point of muscle.   CERVICAL ROM:   Left active cervical rotation is 55 degrees and right is 60 degrees.  UPPER EXTREMITY ROM:  WNL.  UPPER EXTREMITY MMT:  Left shoulder ER strength is decreased to 4/5.  Right grip (dominant side) is 80# and left is 60#.  Left brachioradialis reflex is decreased per contralateral comparison.   TREATMENT DATE:   05/24/23:  UBE x 6 minutes f/b corner stretch 30 sec holds x 2, chin tucks and extension facilitated with a strap and passive cervical rotation with a strap f/b combo e'stim/US at 1.50 W/CM2 x 7 minutes to patient's left cervical  musculature f/b STW/M x 8 minutes f/b HMP and IFC at 80-150 Hz on 40% scan x 20 minutes.   Normal modality response following removal of modality.    05/21/23:  [place] at 1.50 W/CM2 x 12 minutes to patient's left cervical musculature f/b STW/M x 11 minutes including ischemic release technique f/b HMP and IFC at 80-150 Hz on 40% scan x 20 minutes.  Normal modality response following removal of modality.      PATIENT EDUCATION:  Education details:  Person educated:  International aid/development worker:  Education comprehension:   HOME EXERCISE PROGRAM:   ASSESSMENT:  CLINICAL IMPRESSION: Patient doing well with less pain reported.  Instructed in additional home exercises which he performed with very good technique.    OBJECTIVE IMPAIRMENTS: decreased activity tolerance, decreased ROM, decreased strength, increased muscle spasms, postural dysfunction, and pain.   ACTIVITY LIMITATIONS: carrying, lifting, sleeping, and reach over head  PARTICIPATION LIMITATIONS: occupation  PERSONAL FACTORS: Time since onset of injury/illness/exacerbation are also affecting patient's functional outcome.   REHAB POTENTIAL: Good-/Good.  CLINICAL DECISION MAKING: Evolving/moderate complexity  EVALUATION COMPLEXITY: Low   GOALS:  LONG TERM GOALS: Target date: 06/11/23.  Ind with an HEP. Baseline:  Goal status: Partially met  2.  Eliminate left UE symptoms.  Goal status: On going  3.  Perform ADL's with pain not > 2-3/10.  Goal status: On going  4.  Sleep undisturbed.  Goal status:  On going  PLAN:  PT FREQUENCY: 2x/week  PT DURATION: 6 weeks  PLANNED INTERVENTIONS: 97110-Therapeutic exercises, 97530- Therapeutic activity, [id number]- Neuromuscular re-education, 97535- Self Care, 16109- Manual therapy, 97014- Electrical stimulation (unattended), 97035- Ultrasound, 60454- Traction (mechanical), Patient/Family education, Dry Needling, Cryotherapy, and Moist heat  PLAN FOR NEXT SESSION: Combo e'stim/US at 1.50  W/CM2, STW/M, int cervical traction and  Dry needling.  Postural exercises.     Maisa Bedingfield, [name], PT 05/24/2023, 10:31 AM

## 2023-05-28 ENCOUNTER — Encounter: Payer: Self-pay | Admitting: *Deleted

## 2023-05-28 ENCOUNTER — Ambulatory Visit: Payer: Medicare Other | Admitting: *Deleted

## 2023-05-28 DIAGNOSIS — M62838 Other muscle spasm: Secondary | ICD-10-CM | POA: Diagnosis not present

## 2023-05-28 DIAGNOSIS — M542 Cervicalgia: Secondary | ICD-10-CM | POA: Diagnosis not present

## 2023-05-28 NOTE — Therapy (Signed)
 OUTPATIENT PHYSICAL THERAPY CERVICAL TREATMENT  Patient Name: Arthur Foster MRN: [id number] DOB:Jun 26, 1950, 73 y.o., male Today's Date: 05/28/2023  END OF SESSION:  PT End of Session - 05/28/23 0937     Visit Number 9    Number of Visits 12    Date for PT Re-Evaluation 06/11/23    Authorization Type FOTO    PT Start Time 0933    PT Stop Time 1020    PT Time Calculation (min) 47 min              Past Medical History:  Diagnosis Date   Anxiety    BPH (benign prostatic hyperplasia)    Depression    Hyperlipidemia    Hypertension    Testosterone deficiency    Past Surgical History:  Procedure Laterality Date   FOOT SURGERY     HERNIA REPAIR Left inguinal   Patient Active Problem List   Diagnosis Date Noted   Positive colorectal cancer screening using Cologuard test 05/02/2023   DDD (degenerative disc disease), cervical 12/15/2022   Prediabetes 05/11/2022   Controlled substance agreement signed 11/22/2018   Vitamin D deficiency 11/22/2018   Sensorineural hearing loss (SNHL), bilateral 06/17/2018   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  THERAPY DIAG:  Cervicalgia  Other muscle spasm  Rationale for Evaluation and Treatment: Rehabilitation  ONSET DATE: 02/2022.  SUBJECTIVE:                                                                                                                                                                                                          SUBJECTIVE STATEMENT: Can we Do the [place] and massage today as well as the estim .   PERTINENT HISTORY:  HTN.  PAIN:  Are you having pain? Yes: NPRS scale: 2/10. Pain location: Left cervical. Pain description: Pins, needles, numb and ache. Aggravating factors: Raising arms up and increased UE use. Relieving factors: Arms hanging straight down.   PRECAUTIONS: None  RED FLAGS: None     WEIGHT BEARING RESTRICTIONS: No  FALLS:  Has patient fallen in last 6 months? No  LIVING ENVIRONMENT: Lives with:  lives with their spouse Lives in: House/apartment Has following equipment at home: None  OCCUPATION: Personnel officer.    PLOF: Independent  PATIENT GOALS: Be able to do job without pain and symptoms. Sleep.   OBJECTIVE:  Note: Objective measures were completed at Evaluation unless otherwise noted.  DIAGNOSTIC FINDINGS:  09/05/22:  There is no evidence of cervical spine fracture or prevertebral soft tissue swelling. Loss of normal cervical lordosis. Multilevel degenerate disc disease with disc height loss and anterior osteophytes prominent at C4-C5 and C5-C6.   IMPRESSION: Multilevel degenerative disc disease most prominent at C4-C5 and C5-C6.    POSTURE: rounded shoulders and forward head  PALPATION: Left UT tender to palpation at mid-point of muscle.   CERVICAL ROM:   Left active cervical rotation is 55 degrees and right is 60 degrees.  UPPER EXTREMITY ROM:  WNL.  UPPER EXTREMITY MMT:  Left shoulder ER strength is decreased to 4/5.  Right grip (dominant side) is 80# and left is 60#.  Left brachioradialis reflex is decreased per contralateral comparison.   TREATMENT DATE:   05/28/23:  Combo e'stim/US at 1.50 W/CM2 x 12 minutes to patient's left cervical musculature STW/M x 12 minutes to same area with TPR to LT UT HMP and IFC at 80-150 Hz on 40% scan x 15 minutes.   Normal modality response following removal of modality.    05/21/23:  [place] at 1.50 W/CM2 x  12 minutes to patient's left cervical musculature f/b STW/M x 11 minutes including ischemic release technique f/b HMP and IFC at 80-150 Hz on 40% scan x 20 minutes.  Normal modality response following removal of modality.      PATIENT EDUCATION:  Education details:  Person educated:  International aid/development worker:  Education comprehension:   HOME EXERCISE PROGRAM:   ASSESSMENT:  CLINICAL IMPRESSION: Patient arrived today doing well with less pain reported. He reports performing HEP and wanted to focus on [place] combo, STW, and estim today with good results. Pt felt good end of session.    OBJECTIVE IMPAIRMENTS: decreased activity tolerance, decreased ROM, decreased strength, increased muscle spasms, postural dysfunction, and pain.   ACTIVITY LIMITATIONS: carrying, lifting, sleeping, and reach over head  PARTICIPATION LIMITATIONS: occupation  PERSONAL FACTORS: Time since onset of injury/illness/exacerbation are also affecting patient's functional outcome.   REHAB POTENTIAL: Good-/Good.  CLINICAL DECISION MAKING: Evolving/moderate complexity  EVALUATION COMPLEXITY: Low   GOALS:  LONG TERM GOALS: Target date: 06/11/23.  Ind with an HEP. Baseline:  Goal status: Partially met  2.  Eliminate left UE symptoms.  Goal status: On going  3.  Perform ADL's with pain not > 2-3/10.  Goal status: On going  4.  Sleep undisturbed.  Goal status:  On going  PLAN:  PT FREQUENCY: 2x/week  PT DURATION: 6 weeks  PLANNED INTERVENTIONS: 97110-Therapeutic exercises, 97530- Therapeutic activity, [id number]- Neuromuscular re-education, 97535- Self Care, 16109- Manual therapy, 97014- Electrical stimulation (unattended), 97035- Ultrasound, 60454- Traction (mechanical), Patient/Family education, Dry Needling, Cryotherapy, and Moist heat  PLAN FOR NEXT SESSION: Combo e'stim/US at 1.50 W/CM2, STW/M, int cervical traction and  Dry needling.  Postural exercises.     Amadi Frady,CHRIS, PTA 05/28/2023, 10:47 AM

## 2023-05-29 ENCOUNTER — Encounter: Payer: Medicare Other | Admitting: Physical Therapy

## 2023-05-31 ENCOUNTER — Ambulatory Visit: Payer: Medicare Other | Admitting: Physical Therapy

## 2023-05-31 DIAGNOSIS — M542 Cervicalgia: Secondary | ICD-10-CM

## 2023-05-31 DIAGNOSIS — M62838 Other muscle spasm: Secondary | ICD-10-CM | POA: Diagnosis not present

## 2023-05-31 NOTE — Therapy (Signed)
 OUTPATIENT PHYSICAL THERAPY CERVICAL TREATMENT  Patient Name: Arthur Foster MRN: [id number] DOB:1950/11/16, 73 y.o., male Today's Date: 05/31/2023  END OF SESSION:  PT End of Session - 05/31/23 0759     Visit Number 10    Number of Visits 12    Date for PT Re-Evaluation 06/11/23    Authorization Type FOTO    PT Start Time 0801    PT Stop Time 0855    PT Time Calculation (min) 54 min    Activity Tolerance Patient tolerated treatment well    Behavior During Therapy [facility] for tasks assessed/performed               Past Medical History:  Diagnosis Date   Anxiety    BPH (benign prostatic hyperplasia)    Depression    Hyperlipidemia    Hypertension    Testosterone deficiency    Past Surgical History:  Procedure Laterality Date   FOOT SURGERY     HERNIA REPAIR Left inguinal   Patient Active Problem List   Diagnosis Date Noted   Positive colorectal cancer screening using Cologuard test 05/02/2023   DDD (degenerative disc disease), cervical 12/15/2022   Prediabetes 05/11/2022   Controlled substance agreement signed 11/22/2018   Vitamin D deficiency 11/22/2018   Sensorineural hearing loss (SNHL), bilateral 06/17/2018   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  THERAPY DIAG:  Cervicalgia  Other muscle spasm  Rationale for Evaluation and Treatment: Rehabilitation  ONSET DATE: 02/2022.  SUBJECTIVE:                                                                                                                                                                                                          SUBJECTIVE STATEMENT: At least 50% better.  PERTINENT HISTORY:  HTN.  PAIN:  Are you having pain? Yes: NPRS scale: 2/10. Pain location: Left cervical. Pain description: Pins, needles, numb and ache. Aggravating factors: Raising arms up and increased UE use. Relieving factors: Arms hanging straight down.   PRECAUTIONS: None  RED FLAGS: None     WEIGHT BEARING RESTRICTIONS: No  FALLS:  Has patient fallen in  last 6 months? No  LIVING ENVIRONMENT: Lives with: lives with their spouse Lives in: House/apartment Has following equipment at home: None  OCCUPATION: Personnel officer.    PLOF: Independent  PATIENT GOALS: Be able to do job without pain and symptoms. Sleep.   OBJECTIVE:  Note: Objective measures were completed at Evaluation unless otherwise noted.  DIAGNOSTIC FINDINGS:  09/05/22:  There is no evidence of cervical spine fracture or prevertebral soft tissue swelling. Loss of normal cervical lordosis. Multilevel degenerate disc disease with disc height loss and anterior osteophytes prominent at C4-C5 and C5-C6.   IMPRESSION: Multilevel degenerative disc disease most prominent at C4-C5 and C5-C6.    POSTURE: rounded shoulders and forward head  PALPATION: Left UT tender to palpation at mid-point of muscle.   CERVICAL ROM:   Left active cervical rotation is 55 degrees and right is 60 degrees.  UPPER EXTREMITY ROM:  WNL.  UPPER EXTREMITY MMT:  Left shoulder ER strength is decreased to 4/5.  Right grip (dominant side) is 80# and left is 60#.  Left brachioradialis reflex is decreased per contralateral comparison.   TREATMENT DATE:   05/31/23:  Combo e'stim/US at 1.50 W/CM2 x 12 minutes to patient's left cervical musculature STW/M x 11 minutes to LT UT with ischemic release technique utilized f/b HMP and IFC at 80-150 Hz on 40% scan x 20  minutes.   Normal modality response following removal of modality.   05/28/23:  Combo e'stim/US at 1.50 W/CM2 x 12 minutes to patient's left cervical musculature STW/M x 12 minutes to same area with TPR to LT UT HMP and IFC at 80-150 Hz on 40% scan x 15 minutes.   Normal modality response following removal of modality.        PATIENT EDUCATION:  Education details:  Person educated:  International aid/development worker:  Education comprehension:   HOME EXERCISE PROGRAM:   ASSESSMENT:  CLINICAL IMPRESSION: Patient arrived today doing well with less pain reported. He reports performing HEP and wanted to focus on [place] combo, STW, and estim today with good results. Pt felt good end of session.    OBJECTIVE IMPAIRMENTS: decreased activity tolerance, decreased ROM, decreased strength, increased muscle spasms, postural dysfunction, and pain.   ACTIVITY LIMITATIONS: carrying, lifting, sleeping, and reach over head  PARTICIPATION LIMITATIONS: occupation  PERSONAL FACTORS: Time since onset of injury/illness/exacerbation are also affecting patient's functional outcome.   REHAB POTENTIAL: Good-/Good.  CLINICAL DECISION MAKING: Evolving/moderate complexity  EVALUATION COMPLEXITY: Low   GOALS:  LONG TERM GOALS: Target date: 06/11/23.  Ind with an HEP. Baseline:  Goal status: Partially met  2.  Eliminate left UE symptoms.  Goal status: Partially met. 3.  Perform ADL's with pain not > 2-3/10.  Goal status: On going  4.  Sleep undisturbed.  Goal status:  Partially met.  PLAN:  PT FREQUENCY: 2x/week  PT DURATION: 6 weeks  PLANNED INTERVENTIONS: 97110-Therapeutic exercises, 97530- Therapeutic activity, [id number]- Neuromuscular re-education, 97535- Self Care, 14782- Manual therapy, 97014- Electrical stimulation (unattended), 97035- Ultrasound, 95621- Traction (mechanical), Patient/Family education, Dry Needling, Cryotherapy, and Moist heat  PLAN FOR NEXT SESSION: Combo e'stim/US at 1.50 W/CM2,  STW/M, int cervical traction and  Dry needling.  Postural exercises.    Progress Note Reporting Period 04/30/23 to 05/31/23:  See note below for Objective Data and Assessment of Progress/Goals. Patient progressing well and states he is about 50% better overall.      Anakin Varkey, [name], PT 05/31/2023, 9:02 AM

## 2023-06-04 ENCOUNTER — Ambulatory Visit: Payer: Medicare Other | Attending: Family Medicine | Admitting: Physical Therapy

## 2023-06-04 DIAGNOSIS — M542 Cervicalgia: Secondary | ICD-10-CM | POA: Diagnosis not present

## 2023-06-04 DIAGNOSIS — M62838 Other muscle spasm: Secondary | ICD-10-CM | POA: Insufficient documentation

## 2023-06-04 DIAGNOSIS — M6281 Muscle weakness (generalized): Secondary | ICD-10-CM | POA: Insufficient documentation

## 2023-06-04 NOTE — Therapy (Signed)
 OUTPATIENT PHYSICAL THERAPY CERVICAL TREATMENT  Patient Name: Arthur Foster MRN: [id number] DOB:15-Jul-1950, 73 y.o., male Today's Date: 06/04/2023  END OF SESSION:  PT End of Session - 06/04/23 0855     Visit Number 11    Date for PT Re-Evaluation 06/11/23    Authorization Type FOTO    PT Start Time 0800    PT Stop Time 0856    PT Time Calculation (min) 56 min    Activity Tolerance Patient tolerated treatment well    Behavior During Therapy WFL for tasks assessed/performed                Past Medical History:  Diagnosis Date   Anxiety    BPH (benign prostatic hyperplasia)    Depression    Hyperlipidemia    Hypertension    Testosterone deficiency    Past Surgical History:  Procedure Laterality Date   FOOT SURGERY     HERNIA REPAIR Left inguinal   Patient Active Problem List   Diagnosis Date Noted   Positive colorectal cancer screening using Cologuard test 05/02/2023   DDD (degenerative disc disease), cervical 12/15/2022   Prediabetes 05/11/2022   Controlled substance agreement signed 11/22/2018   Vitamin D deficiency 11/22/2018   Sensorineural hearing loss (SNHL), bilateral 06/17/2018   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  THERAPY DIAG:  Cervicalgia  Other muscle spasm  Rationale for Evaluation and Treatment: Rehabilitation  ONSET DATE: 02/2022.  SUBJECTIVE:                                                                                                                                                                                                          SUBJECTIVE STATEMENT: No new complaints.  PERTINENT HISTORY:  HTN.  PAIN:  Are you having pain? Yes: NPRS scale: 2/10. Pain location: Left cervical. Pain description: Pins, needles, numb and ache. Aggravating factors: Raising arms up and increased UE use. Relieving factors: Arms hanging straight down.   PRECAUTIONS: None  RED FLAGS: None     WEIGHT BEARING RESTRICTIONS: No  FALLS:  Has patient fallen in last 6 months? No  LIVING ENVIRONMENT:  Lives with: lives with their spouse Lives in: House/apartment Has following equipment at home: None  OCCUPATION: Personnel officer.    PLOF: Independent  PATIENT GOALS: Be able to do job without pain and symptoms. Sleep.   OBJECTIVE:  Note: Objective measures were completed at Evaluation unless otherwise noted.  DIAGNOSTIC FINDINGS:  09/05/22:  There is no evidence of cervical spine fracture or prevertebral soft tissue swelling. Loss of normal cervical lordosis. Multilevel degenerate disc disease with disc height loss and anterior osteophytes prominent at C4-C5 and C5-C6.   IMPRESSION: Multilevel degenerative disc disease most prominent at C4-C5 and C5-C6.    POSTURE: rounded shoulders and forward head  PALPATION: Left UT tender to palpation at mid-point of muscle.   CERVICAL ROM:   Left active cervical rotation is 55 degrees and right is 60 degrees.  UPPER EXTREMITY ROM:  WNL.  UPPER EXTREMITY MMT:  Left shoulder ER strength is decreased to 4/5.  Right grip (dominant side) is 80# and left is 60#.  Left brachioradialis reflex is decreased per contralateral comparison.   TREATMENT DATE:   06/04/23:  UBE at 90 RPM's x 8 minutes f/b red theraband left shoulder IR/ER to fatigue f/b combo e'stim/US at 1.50 w/CM2 x 7 minutes to left cervical region f/b STW/M x 7 minutes f/b  HMP and IFC at 80-150 Hz on 40% scan x 20 minutes.   Normal modality response following removal of modality.    05/31/23:  Combo e'stim/US at 1.50 W/CM2 x 12 minutes to patient's left cervical musculature STW/M x 11 minutes to LT UT with ischemic release technique utilized f/b HMP and IFC at 80-150 Hz on 40% scan x 20 minutes.   Normal modality response following removal of modality.         PATIENT EDUCATION:  Education details: Red theraband IR/ER(left shoudler). Person educated: Patient Education method: Demo Education comprehension: Handout  HOME EXERCISE PROGRAM: As above.  ASSESSMENT:  CLINICAL IMPRESSION: Added red theraband left IR/ER. Patient performed without complaint.    OBJECTIVE IMPAIRMENTS: decreased activity tolerance, decreased ROM, decreased strength, increased muscle spasms, postural dysfunction, and pain.   ACTIVITY LIMITATIONS: carrying, lifting, sleeping, and reach over head  PARTICIPATION LIMITATIONS: occupation  PERSONAL FACTORS: Time since onset of injury/illness/exacerbation are also affecting patient's functional outcome.   REHAB POTENTIAL: Good-/Good.  CLINICAL DECISION MAKING: Evolving/moderate complexity  EVALUATION COMPLEXITY: Low   GOALS:  LONG TERM GOALS: Target date: 06/11/23.  Ind with an HEP. Baseline:  Goal status: Partially met  2.  Eliminate left UE symptoms.  Goal status: Partially met. 3.  Perform ADL's with pain not > 2-3/10.  Goal status: On going  4.  Sleep undisturbed.  Goal status:  Partially met.  PLAN:  PT FREQUENCY: 2x/week  PT DURATION: 6 weeks  PLANNED INTERVENTIONS: 97110-Therapeutic exercises, 97530- Therapeutic activity, [id number]- Neuromuscular re-education, 97535- Self Care, 40981- Manual therapy, 97014- Electrical stimulation (unattended), 97035- Ultrasound, 19147- Traction (mechanical), Patient/Family education, Dry Needling, Cryotherapy, and Moist heat  PLAN FOR NEXT SESSION: Combo e'stim/US at 1.50 W/CM2, STW/M, int cervical traction and  Dry  needling.  Postural exercises.      Rolonda Pontarelli, [name], PT 06/04/2023, 9:26 AM

## 2023-06-05 ENCOUNTER — Encounter: Payer: Medicare Other | Admitting: *Deleted

## 2023-06-06 ENCOUNTER — Ambulatory Visit: Admitting: *Deleted

## 2023-06-06 ENCOUNTER — Encounter: Payer: Self-pay | Admitting: *Deleted

## 2023-06-06 DIAGNOSIS — M6281 Muscle weakness (generalized): Secondary | ICD-10-CM

## 2023-06-06 DIAGNOSIS — M62838 Other muscle spasm: Secondary | ICD-10-CM | POA: Diagnosis not present

## 2023-06-06 DIAGNOSIS — M542 Cervicalgia: Secondary | ICD-10-CM | POA: Diagnosis not present

## 2023-06-06 NOTE — Therapy (Addendum)
 OUTPATIENT PHYSICAL THERAPY CERVICAL TREATMENT  Patient Name: Arthur Foster MRN: [id number] DOB:[birth year], 73 y.o., male Today's Date: 06/06/2023  END OF SESSION:  PT End of Session - 06/06/23 0800     Visit Number 12    Number of Visits 12    Date for PT Re-Evaluation 06/11/23    PT Start Time 0801                Past Medical History:  Diagnosis Date   Anxiety    BPH (benign prostatic hyperplasia)    Depression    Hyperlipidemia    Hypertension    Testosterone deficiency    Past Surgical History:  Procedure Laterality Date   FOOT SURGERY     HERNIA REPAIR Left inguinal   Patient Active Problem List   Diagnosis Date Noted   Positive colorectal cancer screening using Cologuard test 05/02/2023   DDD (degenerative disc disease), cervical 12/15/2022   Prediabetes 05/11/2022   Controlled substance agreement signed 11/22/2018   Vitamin D deficiency 11/22/2018   Sensorineural hearing loss (SNHL), bilateral 06/17/2018   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   Mixed dyslipidemia 08/08/2017   Abnormal EKG 08/08/2017   Cervical somatic dysfunction 03/06/2016   Generalized anxiety disorder 03/06/2016   Allergic rhinitis 06/18/2013   Morbid obesity (HCC) 06/18/2013   Benign prostatic hyperplasia 07/03/2012   Essential hypertension, benign 07/03/2012   Depression, recurrent (HCC) 07/03/2012   GERD (gastroesophageal reflux disease) 07/03/2012   Testosterone deficiency 07/03/2012   REFERRING PROVIDER: Gilford Foster  REFERRING DIAG: DDD, cervical.  THERAPY DIAG:  Cervicalgia  Other muscle spasm  Muscle weakness (generalized)  Rationale for Evaluation and Treatment: Rehabilitation  ONSET DATE: 02/2022.  SUBJECTIVE:                                                                                                                                                                                                         SUBJECTIVE STATEMENT: No new complaints  today.  PERTINENT HISTORY:  HTN.  PAIN:  Are you having pain? Yes: NPRS scale: 2/10. Pain location: Left cervical. Pain description: Pins, needles, numb and ache. Aggravating factors: Raising arms up and increased UE use. Relieving factors: Arms hanging straight down.   PRECAUTIONS: None  RED FLAGS: None     WEIGHT BEARING RESTRICTIONS: No  FALLS:  Has patient fallen in last 6 months? No  LIVING ENVIRONMENT: Lives with: lives with their spouse Lives in: House/apartment Has following equipment at home: None  OCCUPATION: Personnel officer.    PLOF: Independent  PATIENT GOALS: Be able to  do job without pain and symptoms. Sleep.   OBJECTIVE:  Note: Objective measures were completed at Evaluation unless otherwise noted.  DIAGNOSTIC FINDINGS:  09/05/22:  There is no evidence of cervical spine fracture or prevertebral soft tissue swelling. Loss of normal cervical lordosis. Multilevel degenerate disc disease with disc height loss and anterior osteophytes prominent at C4-C5 and C5-C6.   IMPRESSION: Multilevel degenerative disc disease most prominent at C4-C5 and C5-C6.    POSTURE: rounded shoulders and forward head  PALPATION: Left UT tender to palpation at mid-point of muscle.   CERVICAL ROM:   Left active cervical rotation is 55 degrees and right is 60 degrees.  UPPER EXTREMITY ROM:  WNL.  UPPER EXTREMITY MMT:  Left shoulder ER strength is decreased to 4/5.  Right grip (dominant side) is 80# and left is 60#.  Left brachioradialis reflex is decreased per contralateral comparison.   TREATMENT DATE:   06/04/23 combo e'stim/US at 1.50 w/CM2 x 8 minutes to left cervical region   STW/M x 15 minutes  to LT side Utrap and levator, as well as Cerv. Paras.  HMP and IFC at 80-150 Hz on 40% scan x 20 minutes.  Normal modality response following removal of modality.    05/31/23:  Combo e'stim/US at 1.50 W/CM2 x 12 minutes to patient's left cervical musculature STW/M x 11  minutes to LT UT with ischemic release technique utilized f/b HMP and IFC at 80-150 Hz on 40% scan x 20 minutes.   Normal modality response following removal of modality.         PATIENT EDUCATION:  Education details: Red theraband IR/ER(left shoudler). Person educated: Patient Education method: Demo Education comprehension: Handout  HOME EXERCISE PROGRAM: As above.  ASSESSMENT:  CLINICAL IMPRESSION: Pt arrived today doing fairly well and was able to meet 50% of his LTG's. He has progressed , but still with  neck pain  and LT UE symptoms.DC to HEP   OBJECTIVE IMPAIRMENTS: decreased activity tolerance, decreased ROM, decreased strength, increased muscle spasms, postural dysfunction, and pain.   ACTIVITY LIMITATIONS: carrying, lifting, sleeping, and reach over head  PARTICIPATION LIMITATIONS: occupation  PERSONAL FACTORS: Time since onset of injury/illness/exacerbation are also affecting patient's functional outcome.   REHAB POTENTIAL: Good-/Good.  CLINICAL DECISION MAKING: Evolving/moderate complexity  EVALUATION COMPLEXITY: Low   GOALS:  LONG TERM GOALS: Target date: 06/11/23.  Ind with an HEP. Baseline:  Goal status:  met  2.  Eliminate left UE symptoms.  Goal status: Partially met. 3.  Perform ADL's with pain not > 2-3/10.  Goal status: Partially met   pain 2-4/10 when reaching LT UE  4.  Sleep undisturbed.  Goal status:  MET  PLAN:  PT FREQUENCY: 2x/week  PT DURATION: 6 weeks  PLANNED INTERVENTIONS: 97110-Therapeutic exercises, 97530- Therapeutic activity, 97112- Neuromuscular re-education, 97535- Self Care, 16109- Manual therapy, 97014- Electrical stimulation (unattended), 97035- Ultrasound, 60454- Traction (mechanical), Patient/Family education, Dry Needling, Cryotherapy, and Moist heat  PLAN FOR NEXT SESSION: DC to HEP      Arthur Foster,Arthur Foster, PTA 06/06/2023, 9:06 AM  PHYSICAL THERAPY DISCHARGE SUMMARY  Visits from Start of Care: 12.  Current  functional level related to goals / functional outcomes: See above.     Remaining deficits: Please see goal section.  Patient continues to experience left UE symptoms.   Education / Equipment: HEP.   Patient agrees to discharge. Patient goals were partially met. Patient is being discharged due to  completing course of PT..     Arthur Foster [place] MPT

## 2023-06-15 DIAGNOSIS — M25512 Pain in left shoulder: Secondary | ICD-10-CM | POA: Diagnosis not present

## 2023-06-15 DIAGNOSIS — M5030 Other cervical disc degeneration, unspecified cervical region: Secondary | ICD-10-CM | POA: Diagnosis not present

## 2023-07-05 DIAGNOSIS — M25512 Pain in left shoulder: Secondary | ICD-10-CM | POA: Diagnosis not present

## 2023-10-22 ENCOUNTER — Other Ambulatory Visit: Payer: Self-pay | Admitting: Family Medicine

## 2023-10-22 DIAGNOSIS — F411 Generalized anxiety disorder: Secondary | ICD-10-CM

## 2023-10-22 DIAGNOSIS — Z79899 Other long term (current) drug therapy: Secondary | ICD-10-CM

## 2023-10-22 DIAGNOSIS — F339 Major depressive disorder, recurrent, unspecified: Secondary | ICD-10-CM

## 2023-10-24 ENCOUNTER — Encounter: Payer: Medicare Other | Admitting: Family Medicine

## 2023-10-26 ENCOUNTER — Ambulatory Visit (INDEPENDENT_AMBULATORY_CARE_PROVIDER_SITE_OTHER): Admitting: Family Medicine

## 2023-10-26 ENCOUNTER — Ambulatory Visit: Payer: Self-pay | Admitting: Family Medicine

## 2023-10-26 DIAGNOSIS — L299 Pruritus, unspecified: Secondary | ICD-10-CM

## 2023-10-26 DIAGNOSIS — Z79899 Other long term (current) drug therapy: Secondary | ICD-10-CM

## 2023-10-26 DIAGNOSIS — Z1159 Encounter for screening for other viral diseases: Secondary | ICD-10-CM | POA: Diagnosis not present

## 2023-10-26 DIAGNOSIS — R7303 Prediabetes: Secondary | ICD-10-CM | POA: Diagnosis not present

## 2023-10-26 DIAGNOSIS — I10 Essential (primary) hypertension: Secondary | ICD-10-CM

## 2023-10-26 DIAGNOSIS — F411 Generalized anxiety disorder: Secondary | ICD-10-CM

## 2023-10-26 DIAGNOSIS — E782 Mixed hyperlipidemia: Secondary | ICD-10-CM | POA: Diagnosis not present

## 2023-10-26 DIAGNOSIS — F339 Major depressive disorder, recurrent, unspecified: Secondary | ICD-10-CM

## 2023-10-26 LAB — BAYER DCA HB A1C WAIVED: HB A1C (BAYER DCA - WAIVED): 5.6 % (ref 4.8–5.6)

## 2023-10-26 MED ORDER — LISINOPRIL 20 MG PO TABS: 20.0000 mg | ORAL_TABLET | Freq: Every day | ORAL | 3 refills | Status: AC

## 2023-10-26 MED ORDER — ALPRAZOLAM 0.25 MG PO TABS: ORAL_TABLET | ORAL | 5 refills | Status: DC

## 2023-10-26 MED ORDER — ESCITALOPRAM OXALATE 20 MG PO TABS: 20.0000 mg | ORAL_TABLET | Freq: Every day | ORAL | 3 refills | Status: AC

## 2023-10-26 NOTE — Progress Notes (Signed)
 Subjective:  Patient ID: Arthur Foster, male    DOB: 01-21-1951, 73 y.o.   MRN: [id number]  Patient Care Team: Severa Rock HERO, FNP as PCP - General (Family Medicine)   Chief Complaint:  Medical Management of Chronic Issues   HPI: Arthur Foster is a 73 y.o. male presenting on 10/26/2023 for Medical Management of Chronic Issues  Arthur Foster is a 73 year old male who presents with generalized itching.  He experiences generalized itching, primarily affecting his hands, buttocks, and testicular area. The itching sometimes wakes him up at night, and he has scratched his skin due to the intensity. Benadryl  alleviates the itching, but he prefers not to rely on it regularly. He has not tried taking Claritin or Zyrtec  consistently but has these medications at home. He uses Vaseline and hydrocortisone  cream to manage the itching, which tends to worsen at night. He suspects sweating might contribute to the issue, but the itching occurs in various areas, including his hands and chest.  He describes needing to wipe excessively after bowel movements, which he believes might contribute to irritation in the anal area. He uses wet wipes to ensure cleanliness and showers in the morning to clean thoroughly.  He has a history of hypertension and is taking lisinopril . He also takes Lexapro  for mood stabilization and has previously attempted to discontinue it, resulting in a return of symptoms, so he continues its use. He recently reduced his Xanax  prescription from 45 to 30 tablets, stating that he only uses them occasionally.  No chest pain, leg swelling, shortness of breath, or persistent cough. He reports a transient cough that resolved with cough drops and Nyquil.          Relevant past medical, surgical, family, and social history reviewed and updated as indicated.  Allergies and medications reviewed and updated. Data reviewed: Chart in Epic.   Past Medical History:  Diagnosis Date   Anxiety     BPH (benign prostatic hyperplasia)    Depression    Hyperlipidemia    Hypertension    Testosterone  deficiency     Past Surgical History:  Procedure Laterality Date   FOOT SURGERY     HERNIA REPAIR Left inguinal    Social History   Socioeconomic History   Marital status: Married    Spouse name: [name]   Number of children: 3   Years of education: 12   Highest education level: 12th grade  Occupational History   Occupation: retired    Comment: works a few hours a week  Tobacco Use   Smoking status: Former    Current packs/day: 0.00    Types: Cigarettes    Quit date: 1998    Years since quitting: 27.5   Smokeless tobacco: Former    Types: Chew    Quit date: 2002  Vaping Use   Vaping status: Never Used  Substance and Sexual Activity   Alcohol use: Yes    Alcohol/week: 14.0 standard drinks of alcohol    Types: 14 Cans of beer per week   Drug use: No   Sexual activity: Yes    Birth control/protection: None  Other Topics Concern   Not on file  Social History Narrative   3 daughters, 2 live in [place]. 6 grandchildren.   Was married x 33 years prior to first wife's death.   Remarried to [name].   Social Drivers of Corporate investment banker Strain: Low Risk  (10/26/2023)   Overall Physicist, medical Strain (  CARDIA)    Difficulty of Paying Living Expenses: Not hard at all  Food Insecurity: No Food Insecurity (10/26/2023)   Hunger Vital Sign    Worried About Running Out of Food in the Last Year: Never true    Ran Out of Food in the Last Year: Never true  Transportation Needs: No Transportation Needs (10/26/2023)   PRAPARE - Administrator, Civil Service (Medical): No    Lack of Transportation (Non-Medical): No  Physical Activity: Inactive (05/01/2023)   Exercise Vital Sign    Days of Exercise per Week: 0 days    Minutes of Exercise per Session: 0 min  Stress: No Stress Concern Present (10/26/2023)   [employer] of Occupational Health - Occupational  Stress Questionnaire    Feeling of Stress: Not at all  Social Connections: Moderately Isolated (10/26/2023)   Social Connection and Isolation Panel    Frequency of Communication with Friends and Family: Three times a week    Frequency of Social Gatherings with Friends and Family: Once a week    Attends Religious Services: Never    Database administrator or Organizations: No    Attends Engineer, structural: Not on file    Marital Status: Married  Catering manager Violence: Not At Risk (05/01/2023)   Humiliation, Afraid, Rape, and Kick questionnaire    Fear of Current or Ex-Partner: No    Emotionally Abused: No    Physically Abused: No    Sexually Abused: No    Outpatient Encounter Medications as of 10/26/2023  Medication Sig   Multiple Vitamins-Minerals (MULTIVITAMIN WITH MINERALS) tablet Take 1 tablet by mouth daily.   [DISCONTINUED] ALPRAZolam  (XANAX ) 0.25 MG tablet Take 1 to 2 tablets as needed   [DISCONTINUED] escitalopram  (LEXAPRO ) 20 MG tablet TAKE ONE TABLET DAILY   [DISCONTINUED] lisinopril  (ZESTRIL ) 20 MG tablet Take 1 tablet (20 mg total) by mouth daily.   ALPRAZolam  (XANAX ) 0.25 MG tablet Take 1 to 2 tablets as needed   escitalopram  (LEXAPRO ) 20 MG tablet Take 1 tablet (20 mg total) by mouth daily.   lisinopril  (ZESTRIL ) 20 MG tablet Take 1 tablet (20 mg total) by mouth daily.   No facility-administered encounter medications on file as of 10/26/2023.    Allergies  Allergen Reactions   Bee Venom    Other Other (See Comments)   Short Ragweed Pollen Ext Other (See Comments)   Zolpidem Other (See Comments)   Ambien [Zolpidem Tartrate] Other (See Comments)    Pertinent ROS per HPI, otherwise unremarkable      Objective:  BP 131/66   Pulse 67   Temp 98.2 F (36.8 C)   Ht 6' 1 (1.854 m)   Wt (!) 306 lb 3.2 oz (138.9 kg)   SpO2 95%   BMI 40.40 kg/m    Wt Readings from Last 3 Encounters:  10/26/23 (!) 306 lb 3.2 oz (138.9 kg)  05/01/23 (!) 306 lb (138.8  kg)  04/24/23 (!) 306 lb 9.6 oz (139.1 kg)    Physical Exam Vitals and nursing note reviewed.  Constitutional:      General: He is not in acute distress.    Appearance: Normal appearance. He is well-developed and well-groomed. He is morbidly obese. He is not ill-appearing, toxic-appearing or diaphoretic.  HENT:     Head: Normocephalic and atraumatic.     Jaw: There is normal jaw occlusion.     Right Ear: Hearing, tympanic membrane, ear canal and external ear normal.  Left Ear: Hearing, tympanic membrane, ear canal and external ear normal.     Nose: Nose normal.     Mouth/Throat:     Lips: Pink.     Mouth: Mucous membranes are moist.     Pharynx: Oropharynx is clear. Uvula midline.  Eyes:     General: Lids are normal.     Pupils: Pupils are equal, round, and reactive to light.  Neck:     Thyroid : No thyromegaly.     Vascular: No JVD.     Trachea: Trachea and phonation normal.  Cardiovascular:     Rate and Rhythm: Normal rate and regular rhythm.     Chest Wall: PMI is not displaced.     Pulses:          Dorsalis pedis pulses are 2+ on the right side and 2+ on the left side.       Posterior tibial pulses are 2+ on the right side and 2+ on the left side.     Heart sounds: Normal heart sounds. No murmur heard.    No friction rub. No gallop.  Pulmonary:     Effort: Pulmonary effort is normal. No respiratory distress.     Breath sounds: Normal breath sounds. No wheezing.  Chest:     Chest wall: No mass.  Breasts:    Breasts are symmetrical.  Abdominal:     General: Bowel sounds are normal.     Palpations: Abdomen is soft.  Musculoskeletal:        General: Normal range of motion.     Cervical back: Full passive range of motion without pain, normal range of motion and neck supple.     Right lower leg: No edema.     Left lower leg: No edema.  Feet:     Right foot:     Skin integrity: Skin integrity normal.     Toenail Condition: Right toenails are normal.     Left foot:      Skin integrity: Skin integrity normal.     Toenail Condition: Left toenails are normal.  Lymphadenopathy:     Upper Body:     Right upper body: No supraclavicular, axillary or pectoral adenopathy.     Left upper body: No supraclavicular, axillary or pectoral adenopathy.  Skin:    General: Skin is warm and dry.     Capillary Refill: Capillary refill takes less than 2 seconds.     Coloration: Skin is not cyanotic, jaundiced or pale.     Findings: No rash.  Neurological:     General: No focal deficit present.     Mental Status: He is alert and oriented to person, place, and time.     Sensory: Sensation is intact.     Motor: Motor function is intact.     Coordination: Coordination is intact.     Gait: Gait is intact.     Deep Tendon Reflexes: Reflexes are normal and symmetric.  Psychiatric:        Attention and Perception: Attention and perception normal.        Mood and Affect: Mood and affect normal.        Speech: Speech normal.        Behavior: Behavior normal. Behavior is cooperative.        Thought Content: Thought content normal.        Cognition and Memory: Cognition and memory normal.        Judgment: Judgment normal.  Results for orders placed or performed in visit on 04/24/23  Bayer DCA Hb A1c Waived   Collection Time: 04/24/23  2:05 PM  Result Value Ref Range   HB A1C (BAYER DCA - WAIVED) 5.4 4.8 - 5.6 %  Lipid panel   Collection Time: 04/24/23  2:08 PM  Result Value Ref Range   Cholesterol, Total 223 (H) 100 - 199 mg/dL   Triglycerides 813 (H) 0 - 149 mg/dL   HDL 48 >60 mg/dL   VLDL Cholesterol Cal 33 5 - 40 mg/dL   LDL Chol Calc (NIH) 857 (H) 0 - 99 mg/dL   Chol/HDL Ratio 4.6 0.0 - 5.0 ratio  ToxASSURE Select 13 (MW), Urine   Collection Time: 04/24/23  2:47 PM  Result Value Ref Range   Summary FINAL        Pertinent labs & imaging results that were available during my care of the patient were reviewed by me and considered in my medical decision  making.  Assessment & Plan:  Arthur Foster was seen today for medical management of chronic issues.  Diagnoses and all orders for this visit:  Morbid obesity (HCC) -     Lipid panel -     Bayer DCA Hb A1c Waived  Depression, recurrent (HCC) -     ALPRAZolam  (XANAX ) 0.25 MG tablet; Take 1 to 2 tablets as needed -     escitalopram  (LEXAPRO ) 20 MG tablet; Take 1 tablet (20 mg total) by mouth daily.  Generalized anxiety disorder -     ALPRAZolam  (XANAX ) 0.25 MG tablet; Take 1 to 2 tablets as needed -     escitalopram  (LEXAPRO ) 20 MG tablet; Take 1 tablet (20 mg total) by mouth daily.  Controlled substance agreement signed -     ALPRAZolam  (XANAX ) 0.25 MG tablet; Take 1 to 2 tablets as needed  Essential hypertension, benign -     lisinopril  (ZESTRIL ) 20 MG tablet; Take 1 tablet (20 mg total) by mouth daily. -     Lipid panel  Prediabetes -     Lipid panel -     Bayer DCA Hb A1c Waived  Pruritus Start allergy medication nightly to see if beneficial.   Need for hepatitis C screening test -     Hepatitis C antibody  Mixed dyslipidemia -     Lipid panel     Pruritus Chronic itching primarily affecting the hands, buttocks, and genital area, relieved by antihistamines like Benadryl , suggesting an allergic component. He has not been using daily antihistamines like Claritin or Zyrtec , which may help manage symptoms. The itching is not likely due to sweating or laundry detergent as these have not changed. The use of barrier ointments like CeraVe Healing Ointment or Aquaphor is recommended to prevent chafing and repair the skin barrier. - Start Claritin or Zyrtec  nightly for a couple of weeks to assess improvement. - Use a jar lotion like CeraVe or Cetaphil for moisturizing. - Apply CeraVe Healing Ointment or Aquaphor to the buttocks and genital area to prevent chafing and repair the skin barrier. - Use wet wipes for cleaning after bowel movements to prevent irritation.  Cervical  Radiculopathy Intermittent pins and needles sensation in the hands, likely due to cervical disc issues. He has previously attended physical therapy, which provided temporary relief. Currently using a home exercise with a pillowcase for neck traction, which provides relief. - Continue home neck traction exercises with a pillowcase as instructed by Dr. Burnetta.  Hypertension Well-controlled on lisinopril . He should  monitor for any development of a consistent dry cough, which could be a side effect of lisinopril . - Continue lisinopril  as prescribed. - Monitor for any development of a consistent dry cough, which could be a side effect of lisinopril .  Anxiety Currently managed with Xanax , with a recent reduction in dosage from 45 to 30 tablets. He reports that this is sufficient and is not experiencing any issues with the current dosage. - Continue Xanax  at the reduced dosage of 30 tablets.  Depression Managed on Lexapro . He previously attempted to discontinue the medication but experienced a return of symptoms, indicating the need for continued use to manage serotonin levels. - Continue Lexapro  as prescribed.  Follow-up Routine follow-up and monitoring of chronic conditions. - Schedule follow-up appointment in six months for a physical examination, including EKG and other routine tests.          Continue all other maintenance medications.  Follow up plan: Return in 6 months (on 04/27/2024), or if symptoms worsen or fail to improve, for Annual Physical.   Continue healthy lifestyle choices, including diet (rich in fruits, vegetables, and lean proteins, and low in salt and simple carbohydrates) and exercise (at least 30 minutes of moderate physical activity daily).  Educational handout given for health maintenance   The above assessment and management plan was discussed with the patient. The patient verbalized understanding of and has agreed to the management plan. Patient is aware to call  the clinic if they develop any new symptoms or if symptoms persist or worsen. Patient is aware when to return to the clinic for a follow-up visit. Patient educated on when it is appropriate to go to the emergency department.   Rosaline Bruns, FNP-C [facility] [phone]

## 2023-10-27 LAB — LIPID PANEL
Chol/HDL Ratio: 4.3 ratio (ref 0.0–5.0)
Cholesterol, Total: 199 mg/dL (ref 100–199)
HDL: 46 mg/dL (ref >39)
LDL Chol Calc (NIH): 128 mg/dL — ABNORMAL HIGH (ref 0–99)
Triglycerides: 141 mg/dL (ref 0–149)
VLDL Cholesterol Cal: 25 mg/dL (ref 5–40)

## 2023-10-27 LAB — HEPATITIS C ANTIBODY: Hep C Virus Ab: NONREACTIVE

## 2023-11-05 DIAGNOSIS — H0288A Meibomian gland dysfunction right eye, upper and lower eyelids: Secondary | ICD-10-CM | POA: Diagnosis not present

## 2023-11-05 DIAGNOSIS — H43813 Vitreous degeneration, bilateral: Secondary | ICD-10-CM | POA: Diagnosis not present

## 2023-11-05 DIAGNOSIS — H35361 Drusen (degenerative) of macula, right eye: Secondary | ICD-10-CM | POA: Diagnosis not present

## 2023-11-05 DIAGNOSIS — H524 Presbyopia: Secondary | ICD-10-CM | POA: Diagnosis not present

## 2023-11-05 DIAGNOSIS — H52223 Regular astigmatism, bilateral: Secondary | ICD-10-CM | POA: Diagnosis not present

## 2023-11-05 DIAGNOSIS — H5213 Myopia, bilateral: Secondary | ICD-10-CM | POA: Diagnosis not present

## 2023-11-05 DIAGNOSIS — H0288B Meibomian gland dysfunction left eye, upper and lower eyelids: Secondary | ICD-10-CM | POA: Diagnosis not present

## 2023-12-11 DIAGNOSIS — M25512 Pain in left shoulder: Secondary | ICD-10-CM | POA: Diagnosis not present

## 2023-12-18 ENCOUNTER — Ambulatory Visit (INDEPENDENT_AMBULATORY_CARE_PROVIDER_SITE_OTHER): Admitting: Family Medicine

## 2023-12-18 ENCOUNTER — Encounter: Payer: Self-pay | Admitting: Family Medicine

## 2023-12-18 ENCOUNTER — Telehealth: Payer: Self-pay

## 2023-12-18 VITALS — BP 132/73 | HR 70 | Temp 98.0°F | Ht 73.0 in | Wt 309.0 lb

## 2023-12-18 DIAGNOSIS — L249 Irritant contact dermatitis, unspecified cause: Secondary | ICD-10-CM | POA: Diagnosis not present

## 2023-12-18 DIAGNOSIS — M5412 Radiculopathy, cervical region: Secondary | ICD-10-CM | POA: Diagnosis not present

## 2023-12-18 DIAGNOSIS — F1010 Alcohol abuse, uncomplicated: Secondary | ICD-10-CM

## 2023-12-18 MED ORDER — BETAMETHASONE DIPROPIONATE AUG 0.05 % EX CREA: TOPICAL_CREAM | Freq: Two times a day (BID) | CUTANEOUS | 1 refills | Status: DC

## 2023-12-18 MED ORDER — BETAMETHASONE SOD PHOS & ACET 6 (3-3) MG/ML IJ SUSP
6.0000 mg | Freq: Once | INTRAMUSCULAR | Status: AC
  Administered 2023-12-18: 6 mg via INTRAMUSCULAR

## 2023-12-18 NOTE — Telephone Encounter (Signed)
 We discussed shot vs. Pills. I gave him the shot instead

## 2023-12-18 NOTE — Telephone Encounter (Signed)
 Left message to call back.  Encouraged sending questions via mychart message as well.

## 2023-12-18 NOTE — Progress Notes (Addendum)
 Subjective:  Patient ID: Arthur Foster, male    DOB: November 19, 1950  Age: 73 y.o. MRN: [id number]  CC: Rash   HPI  Discussed the use of AI scribe software for clinical note transcription with the patient, who gave verbal consent to proceed.  History of Present Illness Arthur Foster is a 73 year old male who presents with a rash on his right arm and chest.  He has had a rash on his right arm and chest for over two weeks. 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 Over-the-counter hydrocortisone  cream and Benadryl  have not alleviated the rash.  He experiences paresthesia in both arms, from the elbows down to the hands, which began after hitting his head on a tractor bucket. This sensation worsens when his arms are elevated, such as when driving or holding them up. He has undergone physical therapy for six weeks, which provided temporary relief. He has a history of degenerative disc disease at C4-C5 and C5-C6, and he has been told it might be a pinched nerve.  His hands get hot and the paresthesia wakes him up at night, affecting his sleep. He has previously been on prednisone , which alleviated his lower back pain temporarily.  He drinks two to three beers daily. 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          12/18/2023    9:57 AM 10/26/2023   12:23 PM 05/01/2023   11:55 AM  Depression screen PHQ 2/9  Decreased Interest 0 0 0  Down, Depressed, Hopeless 0 0 0  PHQ - 2 Score 0 0 0  Altered sleeping  0 2  Tired, decreased energy  0 0  Change in appetite  0 0  Feeling bad or failure about yourself   0 0  Trouble concentrating  0 0  Moving slowly or fidgety/restless  0 0  Suicidal thoughts  0 0  PHQ-9 Score  0 2  Difficult doing work/chores Not difficult at all Not difficult at all Not difficult at all    History Arthur Foster has a  past medical history of Anxiety, BPH (benign prostatic hyperplasia), Depression, Hyperlipidemia, Hypertension, and Testosterone  deficiency.   He has a past surgical history that includes Hernia repair (Left, inguinal) and Foot surgery.   His family history includes Alzheimer's disease in his father and mother; Hypertension in his father and mother.He reports that he quit smoking about 27 years ago. His smoking use included cigarettes. He quit smokeless tobacco use about 23 years ago.  His smokeless tobacco use included chew. He reports current alcohol use of about 14.0 standard drinks of alcohol per week. He reports that he does not use drugs.    ROS Review of Systems  Constitutional:  Negative for fever.  Respiratory:  Negative for shortness of breath.   Cardiovascular:  Negative for chest pain.  Musculoskeletal:  Negative for arthralgias.  Skin:  Negative for rash.    Objective:  BP 132/73   Pulse 70   Temp 98 F (36.7 C)   Ht 6' 1 (1.854 m)   Wt (!) 309 lb (140.2 kg)   SpO2 97%   BMI 40.77 kg/m   BP Readings from Last 3 Encounters:  12/18/23 132/73  10/26/23 131/66  04/24/23 (!) 153/88    Wt Readings from Last 3 Encounters:  12/18/23 (!) 309  lb (140.2 kg)  10/26/23 (!) 306 lb 3.2 oz (138.9 kg)  05/01/23 (!) 306 lb (138.8 kg)     Physical Exam Vitals reviewed.  Constitutional:      Appearance: He is well-developed.  HENT:     Head: Normocephalic and atraumatic.     Right Ear: External ear normal.     Left Ear: External ear normal.     Mouth/Throat:     Pharynx: No oropharyngeal exudate or posterior oropharyngeal erythema.  Eyes:     Pupils: Pupils are equal, round, and reactive to light.  Cardiovascular:     Rate and Rhythm: Normal rate and regular rhythm.     Heart sounds: No murmur heard. Pulmonary:     Effort: No respiratory distress.     Breath sounds: Normal breath sounds.  Musculoskeletal:     Cervical back: Normal range of motion and neck supple.   Neurological:     Mental Status: He is alert and oriented to person, place, and time.      Assessment & Plan:  Cervical neuralgia -     Betamethasone  Sod Phos & Acet  Irritant contact dermatitis, unspecified trigger -     Betamethasone  Sod Phos & Acet  Other orders -     Betamethasone  Dipropionate Aug; Apply topically 2 (two) times daily. 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 The rash is itchy and has led to lichenification from scratching. Previous treatment with over-the-counter hydrocortisone  cream and Benadryl  was insufficient. Administer 1 mL intramuscular injection of betamethasone  (Celestone ). Advise avoiding scratching to prevent further lichenification.  Cervical radiculopathy   Chronic bilateral arm and hand paresthesia, exacerbated by arm elevation, is likely due to cervical radiculopathy. Degenerative disc disease is present at C4-C5 and C5-C6. Previous physical therapy provided temporary relief, but symptoms have persisted since October or November of last year. Recommend follow-up with primary care provider, Rosaline, for further evaluation. Suggest MRI of the cervical spine for further assessment.  Alcohol use, daily   Daily alcohol consumption of two to three beers . Advise reducing alcohol consumption.       Follow-up: Return if symptoms worsen or fail to improve.  Butler Der, M.D.

## 2023-12-18 NOTE — Telephone Encounter (Signed)
 Copied from CRM #[id number]. Topic: Clinical - Prescription Issue >> Dec 18, 2023 10:37 AM Rosaria BRAVO wrote: Reason for CRM: Pt called stating that he is missing a prescription of prednisone  per the discussion with Dr. Zollie during today's visit. Please advise.    [facility] [place], [place] - [address] [place] [address] Phone: [phone] Fax: [phone] >> Dec 18, 2023 10:43 AM Rosaria BRAVO wrote: Pt would like to receive a call from the clinic today, has questions for clinical staff. Please advise   Best contact: [phone]  Also wants to discuss the alcohol abuse statement on his chart.

## 2023-12-20 ENCOUNTER — Telehealth: Payer: Self-pay | Admitting: Family Medicine

## 2023-12-20 NOTE — Telephone Encounter (Signed)
 Copied from CRM [id number]. Topic: Complaint (DO NOT CONVERT) - Provider (sensitive) >> Dec 19, 2023  4:56 PM Turkey B wrote: Date of Incident: 12/18/2023  Details of complaint: not happy with Dr Zollie not giving him prednisone  and cream instead of shot that Dr Zollie gave and also about not having the cream and not prednisone  at the pharmacy and also about info on his chart about alcohol abuse and he is'nt an alcoholic  How would the patient like to see it resolved? He wants the alcoholic abuse to be taken off his chart, since he is stating its not true and he just drinks beer even once in a while  On a scale of 1-10, how was your experience?  What would it take to bring it to a 10? 5  Route to Research officer, political party.

## 2023-12-20 NOTE — Telephone Encounter (Signed)
 Amendment process started

## 2024-01-01 NOTE — Telephone Encounter (Signed)
 Dr. Zollie has approved the amendment request for the patients record.

## 2024-01-17 DIAGNOSIS — G5621 Lesion of ulnar nerve, right upper limb: Secondary | ICD-10-CM | POA: Diagnosis not present

## 2024-01-17 DIAGNOSIS — G5602 Carpal tunnel syndrome, left upper limb: Secondary | ICD-10-CM | POA: Diagnosis not present

## 2024-02-12 ENCOUNTER — Telehealth: Payer: Self-pay | Admitting: Family Medicine

## 2024-02-12 NOTE — Telephone Encounter (Signed)
 Patient calling to check on amendment to his chart for date of service 12/18/23.  Dr. Zollie approved the amendment to remove alcohol abuse from the records.  Chart is still showing this. Can this be amended in the chart as it is approved?

## 2024-02-21 ENCOUNTER — Encounter: Admitting: Family Medicine

## 2024-02-22 ENCOUNTER — Encounter: Payer: Self-pay | Admitting: Family Medicine

## 2024-02-22 ENCOUNTER — Ambulatory Visit (INDEPENDENT_AMBULATORY_CARE_PROVIDER_SITE_OTHER): Admitting: Family Medicine

## 2024-02-22 VITALS — BP 142/77 | HR 66 | Temp 97.4°F | Ht 73.0 in | Wt 312.8 lb

## 2024-02-22 DIAGNOSIS — L249 Irritant contact dermatitis, unspecified cause: Secondary | ICD-10-CM

## 2024-02-22 DIAGNOSIS — M25561 Pain in right knee: Secondary | ICD-10-CM

## 2024-02-22 DIAGNOSIS — M25462 Effusion, left knee: Secondary | ICD-10-CM | POA: Diagnosis not present

## 2024-02-22 DIAGNOSIS — G8929 Other chronic pain: Secondary | ICD-10-CM | POA: Diagnosis not present

## 2024-02-22 DIAGNOSIS — R202 Paresthesia of skin: Secondary | ICD-10-CM

## 2024-02-22 DIAGNOSIS — M25562 Pain in left knee: Secondary | ICD-10-CM | POA: Diagnosis not present

## 2024-02-22 MED ORDER — PREDNISONE 10 MG (21) PO TBPK: ORAL_TABLET | ORAL | 0 refills | Status: DC

## 2024-02-22 MED ORDER — METHYLPREDNISOLONE ACETATE 80 MG/ML IJ SUSP
80.0000 mg | Freq: Once | INTRAMUSCULAR | Status: AC
  Administered 2024-02-22: 60 mg via INTRAMUSCULAR

## 2024-02-22 NOTE — Addendum Note (Signed)
 Addended by: OLENA HARLENE BROCKS on: 02/22/2024 04:37 PM   Modules accepted: Orders

## 2024-02-22 NOTE — Progress Notes (Addendum)
 Subjective:  Patient ID: Arthur Foster, male    DOB: Dec 22, 1950, 73 y.o.   MRN: [id number]  Patient Care Team: Severa Rock HERO, FNP as PCP - General (Family Medicine)   Chief Complaint:  Rash (Right lower arm and abdomen x 2 months )   HPI: Arthur Foster is a 73 y.o. male presenting on 02/22/2024 for Rash (Right lower arm and abdomen x 2 months )    Arthur Foster is a 73 year old male who presents with a persistent rash and knee pain.  He has been experiencing a persistent rash for approximately two months, which began around September. Initially, a steroid injection temporarily resolved the rash in one area, but it has since reappeared in other locations, including his stomach. He describes the rash as welts and has not identified any new products or substances that could be causing it. He was prescribed a cream, but there was a misunderstanding regarding a prednisone  prescription, which he did not receive.  He reports knee pain in a knee that was previously replaced. The pain and swelling have been present for about a week, with noticeable fluid accumulation. He has been using ice packs at night and has been advised to elevate his feet, although he has not yet done so. He inquires about the possibility of fluid drainage from the knee.  He has a history of degenerative disc disease, diagnosed at [facility]. He experiences pins and needles sensations, which were evaluated with electrical testing, revealing a pinched nerve in his elbow and wrist. He manages these symptoms with ibuprofen  and topical lidocaine, which he applies at night. He inquires about the safety of using lidocaine and currently uses it only at night.          Relevant past medical, surgical, family, and social history reviewed and updated as indicated.  Allergies and medications reviewed and updated. Data reviewed: Chart in Epic.   Past Medical History:  Diagnosis Date   Anxiety    BPH (benign  prostatic hyperplasia)    Depression    Hyperlipidemia    Hypertension    Testosterone  deficiency     Past Surgical History:  Procedure Laterality Date   FOOT SURGERY     HERNIA REPAIR Left inguinal    Social History   Socioeconomic History   Marital status: Married    Spouse name: Arthur Foster   Number of children: 3   Years of education: 12   Highest education level: 12th grade  Occupational History   Occupation: retired    Comment: works a few hours a week  Tobacco Use   Smoking status: Former    Current packs/day: 0.00    Types: Cigarettes    Quit date: 1998    Years since quitting: 27.9   Smokeless tobacco: Former    Types: Chew    Quit date: 2002  Vaping Use   Vaping status: Never Used  Substance and Sexual Activity   Alcohol use: Yes    Alcohol/week: 14.0 standard drinks of alcohol    Types: 14 Cans of beer per week   Drug use: No   Sexual activity: Yes    Birth control/protection: None  Other Topics Concern   Not on file  Social History Narrative   3 daughters, 2 live in [place]. 6 grandchildren.   Was married x 33 years prior to first wife's death.   Remarried to Arthur Foster.   Social Drivers of Corporate Investment Banker Strain: Low Risk  (  10/26/2023)   Overall Financial Resource Strain (CARDIA)    Difficulty of Paying Living Expenses: Not hard at all  Food Insecurity: No Food Insecurity (10/26/2023)   Hunger Vital Sign    Worried About Running Out of Food in the Last Year: Never true    Ran Out of Food in the Last Year: Never true  Transportation Needs: No Transportation Needs (10/26/2023)   PRAPARE - Administrator, Civil Service (Medical): No    Lack of Transportation (Non-Medical): No  Physical Activity: Inactive (05/01/2023)   Exercise Vital Sign    Days of Exercise per Week: 0 days    Minutes of Exercise per Session: 0 min  Stress: No Stress Concern Present (10/26/2023)   [employer] of Occupational Health - Occupational Stress  Questionnaire    Feeling of Stress: Not at all  Social Connections: Moderately Isolated (10/26/2023)   Social Connection and Isolation Panel    Frequency of Communication with Friends and Family: Three times a week    Frequency of Social Gatherings with Friends and Family: Once a week    Attends Religious Services: Never    Database Administrator or Organizations: No    Attends Engineer, Structural: Not on file    Marital Status: Married  Catering Manager Violence: Not At Risk (05/01/2023)   Humiliation, Afraid, Rape, and Kick questionnaire    Fear of Current or Ex-Partner: No    Emotionally Abused: No    Physically Abused: No    Sexually Abused: No    Outpatient Encounter Medications as of 02/22/2024  Medication Sig   ALPRAZolam  (XANAX ) 0.25 MG tablet Take 1 to 2 tablets as needed   augmented betamethasone  dipropionate (DIPROLENE -AF) 0.05 % cream Apply topically 2 (two) times daily. At affected areas (avoid face and genitals)   escitalopram  (LEXAPRO ) 20 MG tablet Take 1 tablet (20 mg total) by mouth daily.   lisinopril  (ZESTRIL ) 20 MG tablet Take 1 tablet (20 mg total) by mouth daily.   Multiple Vitamins-Minerals (MULTIVITAMIN WITH MINERALS) tablet Take 1 tablet by mouth daily.   predniSONE  (STERAPRED UNI-PAK 21 TAB) 10 MG (21) TBPK tablet As directed x 6 days   No facility-administered encounter medications on file as of 02/22/2024.    Allergies  Allergen Reactions   Bee Venom    Other Other (See Comments)   Pollen Extract Other (See Comments)   Short Ragweed Pollen Ext Other (See Comments)   Zolpidem Other (See Comments)   Zolpidem Tartrate Other (See Comments)    Pertinent ROS per HPI, otherwise unremarkable      Objective:  BP (!) 142/77   Pulse 66   Temp (!) 97.4 F (36.3 C)   Ht 6' 1 (1.854 m)   Wt (!) 312 lb 12.8 oz (141.9 kg)   SpO2 96%   BMI 41.27 kg/m    Wt Readings from Last 3 Encounters:  02/22/24 (!) 312 lb 12.8 oz (141.9 kg)  12/18/23  (!) 309 lb (140.2 kg)  10/26/23 (!) 306 lb 3.2 oz (138.9 kg)    Physical Exam Vitals and nursing note reviewed.  Constitutional:      General: He is not in acute distress.    Appearance: Normal appearance. He is well-developed and well-groomed. He is obese. He is not ill-appearing, toxic-appearing or diaphoretic.  HENT:     Head: Normocephalic and atraumatic.     Jaw: There is normal jaw occlusion.     Right Ear: Hearing normal.  Left Ear: Hearing normal.     Nose: Nose normal.     Mouth/Throat:     Lips: Pink.     Mouth: Mucous membranes are moist.     Pharynx: Oropharynx is clear. Uvula midline.  Eyes:     General: Lids are normal.     Extraocular Movements: Extraocular movements intact.     Conjunctiva/sclera: Conjunctivae normal.     Pupils: Pupils are equal, round, and reactive to light.  Neck:     Thyroid : No thyroid  mass, thyromegaly or thyroid  tenderness.     Vascular: No carotid bruit or JVD.     Trachea: Trachea and phonation normal.  Cardiovascular:     Rate and Rhythm: Normal rate and regular rhythm.     Chest Wall: PMI is not displaced.     Pulses: Normal pulses.     Heart sounds: Normal heart sounds. No murmur heard.    No friction rub. No gallop.  Pulmonary:     Effort: Pulmonary effort is normal. No respiratory distress.     Breath sounds: Normal breath sounds. No wheezing.  Abdominal:     General: Bowel sounds are normal. There is no distension or abdominal bruit.     Palpations: Abdomen is soft. There is no hepatomegaly or splenomegaly.     Tenderness: There is no abdominal tenderness. There is no right CVA tenderness or left CVA tenderness.     Hernia: No hernia is present.  Musculoskeletal:     Cervical back: Normal range of motion and neck supple.     Right knee: Swelling (minimal) present. No deformity.     Left knee: Effusion present. No deformity. Decreased range of motion. Tenderness present.     Right lower leg: No edema.     Left lower leg:  No edema.  Lymphadenopathy:     Cervical: No cervical adenopathy.  Skin:    General: Skin is warm and dry.     Capillary Refill: Capillary refill takes less than 2 seconds.     Coloration: Skin is not cyanotic, jaundiced or pale.     Findings: Erythema and rash present. Rash is macular and papular.      Neurological:     General: No focal deficit present.     Mental Status: He is alert and oriented to person, place, and time.     Sensory: Sensation is intact.     Motor: Motor function is intact.     Coordination: Coordination is intact.     Gait: Gait is intact.     Deep Tendon Reflexes: Reflexes are normal and symmetric.  Psychiatric:        Attention and Perception: Attention and perception normal.        Mood and Affect: Mood and affect normal.        Speech: Speech normal.        Behavior: Behavior normal. Behavior is cooperative.        Thought Content: Thought content normal.        Cognition and Memory: Cognition and memory normal.        Judgment: Judgment normal.      Results for orders placed or performed in visit on 10/26/23  Bayer DCA Hb A1c Waived   Collection Time: 10/26/23 12:36 PM  Result Value Ref Range   HB A1C (BAYER DCA - WAIVED) 5.6 4.8 - 5.6 %  Lipid panel   Collection Time: 10/26/23 12:38 PM  Result Value Ref Range   Cholesterol,  Total 199 100 - 199 mg/dL   Triglycerides 858 0 - 149 mg/dL   HDL 46 >60 mg/dL   VLDL Cholesterol Cal 25 5 - 40 mg/dL   LDL Chol Calc (NIH) 871 (H) 0 - 99 mg/dL   Chol/HDL Ratio 4.3 0.0 - 5.0 ratio  Hepatitis C antibody   Collection Time: 10/26/23 12:38 PM  Result Value Ref Range   Hep C Virus Ab Non Reactive Non Reactive       Pertinent labs & imaging results that were available during my care of the patient were reviewed by me and considered in my medical decision making.  Assessment & Plan:  Arthur Foster was seen today for rash.  Diagnoses and all orders for this visit:  Irritant contact dermatitis,  unspecified trigger -     predniSONE  (STERAPRED UNI-PAK 21 TAB) 10 MG (21) TBPK tablet; As directed x 6 days  Effusion of left knee -     Ambulatory referral to Orthopedic Surgery  Chronic pain of both knees -     Ambulatory referral to Orthopedic Surgery  Paresthesias -     predniSONE  (STERAPRED UNI-PAK 21 TAB) 10 MG (21) TBPK tablet; As directed x 6 days     Irritant contact dermatitis Persistent rash since September, initially treated with a steroid injection and cream. Rash persists on the stomach and other areas, likely due to contact with an irritant such as soap, perfume, or detergent. Previous diagnosis of poison ivy or poison oak was uncertain. - Administered steroid injection - Prescribed prednisone   Effusion, left knee Significant fluid accumulation in the left knee with associated pain, present for over a week. Previous knee surgery noted. Fluid likely causing swelling and discomfort. - Administered steroid injection - Prescribed prednisone  - If no improvement in a week, will schedule appointment for fluid aspiration  Degenerative disc disease Symptoms include pain and discomfort, potentially exacerbated by nerve issues. 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 Symptoms include pins and needles sensation and pain, managed with ibuprofen  and topical lidocaine. - Continue ibuprofen  as needed - Use topical lidocaine up to six times a day as needed          Continue all other maintenance medications.  Follow up plan: Return if symptoms worsen or fail to improve.   Continue healthy lifestyle choices, including diet (rich in fruits, vegetables, and lean proteins, and low in salt and simple carbohydrates) and exercise (at least 30 minutes of moderate physical activity daily).    The above assessment and management  plan was discussed with the patient. The patient verbalized understanding of and has agreed to the management plan. Patient is aware to call the clinic if they develop any new symptoms or if symptoms persist or worsen. Patient is aware when to return to the clinic for a follow-up visit. Patient educated on when it is appropriate to go to the emergency department.   Rosaline Bruns, FNP-C [facility] [phone]

## 2024-03-11 ENCOUNTER — Ambulatory Visit: Payer: Self-pay | Admitting: *Deleted

## 2024-03-11 NOTE — Telephone Encounter (Signed)
 FYI Only or Action Required?: FYI only for provider: appointment scheduled on 12/10.  Patient was last seen in primary care on 02/22/2024 by Severa Rock HERO, FNP.  Called Nurse Triage reporting Rash.  Symptoms began several weeks ago.  Interventions attempted: augmented betamethasone  dipropionate (DIPROLENE -AF) 0.05 % cream .  Symptoms are: unchanged.  Triage Disposition: See Physician Within 24 Hours  Patient/caregiver understands and will follow disposition?: yes  Copied from CRM #[id number]. Topic: Clinical - Red Word Triage >> Mar 11, 2024 11:44 AM Gustabo D wrote: Swelling on knee and rash on stomach was given medication and cream for it. It's not going away. Lots of itching and on his chest and stomach.Knee is doing good. Reason for Disposition  SEVERE itching (i.e., interferes with sleep, normal activities or school)  Answer Assessment - Initial Assessment Questions 1. APPEARANCE of RASH: What does the rash look like? (e.g., blisters, dry flaky skin, red spots, redness, sores)     Red, little bumpy 2. SIZE: How big are the spots? (e.g., tip of pen, eraser, coin; inches, centimeters)     Small bumps 3. LOCATION: Where is the rash located?     Chest/abdomen 4. COLOR: What color is the rash? (Note: It is difficult to assess rash color in people with darker-colored skin. When this situation occurs, simply ask the caller to describe what they see.)     red 5. ONSET: When did the rash begin?     11/21 6. FEVER: Do you have a fever? If Yes, ask: What is your temperature, how was it measured, and when did it start?     no 7. ITCHING: Does the rash itch? If Yes, ask: How bad is the itch? (Scale 1-10; or mild, moderate, severe)     Moderate/severe 8. CAUSE: What do you think is causing the rash?     unsure 9. MEDICINE FACTORS: Have you started any new medicines within the last 2 weeks? (e.g., antibiotics)      no 10. OTHER SYMPTOMS: Do you have any other  symptoms? (e.g., dizziness, headache, sore throat, joint pain)       none  Protocols used: Rash or Redness - [facility]

## 2024-03-11 NOTE — Telephone Encounter (Signed)
Noted  -LS

## 2024-03-12 ENCOUNTER — Encounter: Payer: Self-pay | Admitting: Family Medicine

## 2024-03-12 ENCOUNTER — Ambulatory Visit: Admitting: Family Medicine

## 2024-03-12 VITALS — BP 131/85 | HR 74 | Temp 97.4°F | Ht 73.0 in | Wt 308.2 lb

## 2024-03-12 DIAGNOSIS — L2989 Other pruritus: Secondary | ICD-10-CM

## 2024-03-12 MED ORDER — CETIRIZINE HCL 10 MG PO TABS: 10.0000 mg | ORAL_TABLET | Freq: Every day | ORAL | 11 refills | Status: AC

## 2024-03-12 MED ORDER — BETAMETHASONE DIPROPIONATE AUG 0.05 % EX CREA: TOPICAL_CREAM | Freq: Two times a day (BID) | CUTANEOUS | 1 refills | Status: AC

## 2024-03-12 MED ORDER — FAMOTIDINE 20 MG PO TABS: 20.0000 mg | ORAL_TABLET | Freq: Two times a day (BID) | ORAL | 0 refills | Status: AC

## 2024-03-12 MED ORDER — PREDNISONE 20 MG PO TABS: 20.0000 mg | ORAL_TABLET | Freq: Every day | ORAL | 0 refills | Status: AC

## 2024-03-12 NOTE — Progress Notes (Signed)
 Subjective:  Patient ID: Arthur Foster, male    DOB: 1951-03-13, 73 y.o.   MRN: [id number]  Patient Care Team: Severa Rock HERO, FNP as PCP - General (Family Medicine)   Chief Complaint:  Rash (Patient was seen on 11/21 and states that the rash has not improved.  States it is on his chest and abdomen )   HPI: Arthur Foster is a 73 y.o. male presenting on 03/12/2024 for Rash (Patient was seen on 11/21 and states that the rash has not improved.  States it is on his chest and abdomen )   Arthur Foster is a 73 year old male who presents with a persistent rash on his chest.  He has a persistent, itchy, and welted rash on his chest and belly that has been ongoing since September. The rash does not completely resolve before reappearing.  He has been using a cream that temporarily alleviates the itching but does not resolve the rash. He was previously treated with oral prednisone , which did not fully resolve the rash. He has also tried taking vitamin D3 and zinc supplements after reading online that they might help, but he experienced increased itching after starting vitamin D3, leading him to discontinue it. He continues to take zinc.  He is not aware of any recent tick bites. He has cleaned his washing machine with vinegar and a product called Afresh to eliminate any potential irritants.  No throat swelling, trouble breathing, coughing, or lip swelling.          Relevant past medical, surgical, family, and social history reviewed and updated as indicated.  Allergies and medications reviewed and updated. Data reviewed: Chart in Epic.   Past Medical History:  Diagnosis Date   Anxiety    BPH (benign prostatic hyperplasia)    Depression    Hyperlipidemia    Hypertension    Testosterone  deficiency     Past Surgical History:  Procedure Laterality Date   FOOT SURGERY     HERNIA REPAIR Left inguinal    Social History   Socioeconomic History   Marital status: Married    Spouse  name: [name]   Number of children: 3   Years of education: 12   Highest education level: 12th grade  Occupational History   Occupation: retired    Comment: works a few hours a week  Tobacco Use   Smoking status: Former    Current packs/day: 0.00    Types: Cigarettes    Quit date: 1998    Years since quitting: 27.9   Smokeless tobacco: Former    Types: Chew    Quit date: 2002  Vaping Use   Vaping status: Never Used  Substance and Sexual Activity   Alcohol use: Yes    Alcohol/week: 14.0 standard drinks of alcohol    Types: 14 Cans of beer per week   Drug use: No   Sexual activity: Yes    Birth control/protection: None  Other Topics Concern   Not on file  Social History Narrative   3 daughters, 2 live in [place]. 6 grandchildren.   Was married x 33 years prior to first wife's death.   Remarried to [name].   Social Drivers of Corporate Investment Banker Strain: Low Risk  (10/26/2023)   Overall Financial Resource Strain (CARDIA)    Difficulty of Paying Living Expenses: Not hard at all  Food Insecurity: No Food Insecurity (10/26/2023)   Hunger Vital Sign    Worried About Running Out  of Food in the Last Year: Never true    Ran Out of Food in the Last Year: Never true  Transportation Needs: No Transportation Needs (10/26/2023)   PRAPARE - Administrator, Civil Service (Medical): No    Lack of Transportation (Non-Medical): No  Physical Activity: Inactive (05/01/2023)   Exercise Vital Sign    Days of Exercise per Week: 0 days    Minutes of Exercise per Session: 0 min  Stress: No Stress Concern Present (10/26/2023)   [employer] of Occupational Health - Occupational Stress Questionnaire    Feeling of Stress: Not at all  Social Connections: Moderately Isolated (10/26/2023)   Social Connection and Isolation Panel    Frequency of Communication with Friends and Family: Three times a week    Frequency of Social Gatherings with Friends and Family: Once a week     Attends Religious Services: Never    Database Administrator or Organizations: No    Attends Engineer, Structural: Not on file    Marital Status: Married  Catering Manager Violence: Not At Risk (05/01/2023)   Humiliation, Afraid, Rape, and Kick questionnaire    Fear of Current or Ex-Partner: No    Emotionally Abused: No    Physically Abused: No    Sexually Abused: No    Outpatient Encounter Medications as of 03/12/2024  Medication Sig   ALPRAZolam  (XANAX ) 0.25 MG tablet Take 1 to 2 tablets as needed   cetirizine  (ZYRTEC ) 10 MG tablet Take 1 tablet (10 mg total) by mouth daily.   escitalopram  (LEXAPRO ) 20 MG tablet Take 1 tablet (20 mg total) by mouth daily.   famotidine  (PEPCID ) 20 MG tablet Take 1 tablet (20 mg total) by mouth 2 (two) times daily for 5 days.   lisinopril  (ZESTRIL ) 20 MG tablet Take 1 tablet (20 mg total) by mouth daily.   Multiple Vitamins-Minerals (MULTIVITAMIN WITH MINERALS) tablet Take 1 tablet by mouth daily.   predniSONE  (DELTASONE ) 20 MG tablet Take 1 tablet (20 mg total) by mouth daily with breakfast for 5 days.   [DISCONTINUED] augmented betamethasone  dipropionate (DIPROLENE -AF) 0.05 % cream Apply topically 2 (two) times daily. At affected areas (avoid face and genitals)   augmented betamethasone  dipropionate (DIPROLENE -AF) 0.05 % cream Apply topically 2 (two) times daily. At affected areas (avoid face and genitals)   [DISCONTINUED] predniSONE  (STERAPRED UNI-PAK 21 TAB) 10 MG (21) TBPK tablet As directed x 6 days   No facility-administered encounter medications on file as of 03/12/2024.    Allergies  Allergen Reactions   Bee Venom    Other Other (See Comments)   Pollen Extract Other (See Comments)   Short Ragweed Pollen Ext Other (See Comments)   Zolpidem Other (See Comments)   Zolpidem Tartrate Other (See Comments)    Pertinent ROS per HPI, otherwise unremarkable      Objective:  BP 131/85   Pulse 74   Temp (!) 97.4 F (36.3 C)   Ht 6'  1 (1.854 m)   Wt (!) 308 lb 3.2 oz (139.8 kg)   SpO2 94%   BMI 40.66 kg/m    Wt Readings from Last 3 Encounters:  03/12/24 (!) 308 lb 3.2 oz (139.8 kg)  02/22/24 (!) 312 lb 12.8 oz (141.9 kg)  12/18/23 (!) 309 lb (140.2 kg)    Physical Exam Vitals and nursing note reviewed.  Constitutional:      General: He is not in acute distress.    Appearance: Normal appearance. He is  obese. He is not ill-appearing, toxic-appearing or diaphoretic.  HENT:     Head: Normocephalic and atraumatic.     Mouth/Throat:     Mouth: Mucous membranes are moist.  Eyes:     Pupils: Pupils are equal, round, and reactive to light.  Cardiovascular:     Rate and Rhythm: Normal rate and regular rhythm.     Heart sounds: Normal heart sounds.  Pulmonary:     Effort: Pulmonary effort is normal.     Breath sounds: Normal breath sounds.  Musculoskeletal:     Cervical back: Neck supple.     Right lower leg: No edema.     Left lower leg: No edema.  Skin:    General: Skin is warm and dry.     Capillary Refill: Capillary refill takes less than 2 seconds.     Findings: Erythema and rash present. Rash is macular, papular and urticarial.      Neurological:     General: No focal deficit present.     Mental Status: He is alert and oriented to person, place, and time.  Psychiatric:        Mood and Affect: Mood normal.        Behavior: Behavior normal.        Thought Content: Thought content normal.        Judgment: Judgment normal.      Results for orders placed or performed in visit on 10/26/23  Bayer DCA Hb A1c Waived   Collection Time: 10/26/23 12:36 PM  Result Value Ref Range   HB A1C (BAYER DCA - WAIVED) 5.6 4.8 - 5.6 %  Lipid panel   Collection Time: 10/26/23 12:38 PM  Result Value Ref Range   Cholesterol, Total 199 100 - 199 mg/dL   Triglycerides 858 0 - 149 mg/dL   HDL 46 >60 mg/dL   VLDL Cholesterol Cal 25 5 - 40 mg/dL   LDL Chol Calc (NIH) 871 (H) 0 - 99 mg/dL   Chol/HDL Ratio 4.3 0.0 -  5.0 ratio  Hepatitis C antibody   Collection Time: 10/26/23 12:38 PM  Result Value Ref Range   Hep C Virus Ab Non Reactive Non Reactive       Pertinent labs & imaging results that were available during my care of the patient were reviewed by me and considered in my medical decision making.  Assessment & Plan:  Hansen Carino was seen today for rash.  Diagnoses and all orders for this visit:  Pruritic erythematous rash -     Ambulatory referral to Dermatology -     cetirizine  (ZYRTEC ) 10 MG tablet; Take 1 tablet (10 mg total) by mouth daily. -     famotidine  (PEPCID ) 20 MG tablet; Take 1 tablet (20 mg total) by mouth 2 (two) times daily for 5 days. -     predniSONE  (DELTASONE ) 20 MG tablet; Take 1 tablet (20 mg total) by mouth daily with breakfast for 5 days. -     Ambulatory referral to Allergy -     augmented betamethasone  dipropionate (DIPROLENE -AF) 0.05 % cream; Apply topically 2 (two) times daily. At affected areas (avoid face and genitals)       Allergic contact dermatitis Persistent rash on the chest and belly, ongoing since September. The rash is welts and itches, with no complete resolution. Previous treatment with oral prednisone  and topical cream provided temporary relief. Differential diagnosis includes allergic contact dermatitis, possibly triggered by an unknown allergen. No recent tick bites or new  household products identified as potential triggers. No systemic symptoms such as throat swelling, difficulty breathing, or lip swelling. Vitamin D3 supplementation was associated with increased itching, and zinc supplementation may have been linked to flare-ups. - Prescribed Pepcid  (H2 blocker) for two weeks. - Prescribed Zyrtec  (H1 blocker) for two weeks. - Prescribed a five-day course of oral prednisone . - Referred to Dr. Shona for dermatology evaluation. - Referred to an allergist for allergy testing. - Advised to monitor for new or worsening symptoms, including rash in new  areas, nausea, vomiting, cough, or shortness of breath. - Instructed to contact the office if no response from referrals within a week or two.          Continue all other maintenance medications.  Follow up plan: Return if symptoms worsen or fail to improve.   Continue healthy lifestyle choices, including diet (rich in fruits, vegetables, and lean proteins, and low in salt and simple carbohydrates) and exercise (at least 30 minutes of moderate physical activity daily).  Educational handout given for contact dermatitis   The above assessment and management plan was discussed with the patient. The patient verbalized understanding of and has agreed to the management plan. Patient is aware to call the clinic if they develop any new symptoms or if symptoms persist or worsen. Patient is aware when to return to the clinic for a follow-up visit. Patient educated on when it is appropriate to go to the emergency department.   Rosaline Bruns, FNP-C [facility] [phone]

## 2024-05-01 ENCOUNTER — Ambulatory Visit

## 2024-05-01 VITALS — BP 131/85 | HR 74 | Ht 73.0 in | Wt 308.0 lb

## 2024-05-01 DIAGNOSIS — Z0000 Encounter for general adult medical examination without abnormal findings: Secondary | ICD-10-CM

## 2024-05-01 NOTE — Patient Instructions (Signed)
 Arthur Foster,  Thank you for taking the time for your Medicare Wellness Visit. I appreciate your continued commitment to your health goals. Please review the care plan we discussed, and feel free to reach out if I can assist you further.  Please note that Annual Wellness Visits do not include a physical exam. Some assessments may be limited, especially if the visit was conducted virtually. If needed, we may recommend an in-person follow-up with your provider.  Ongoing Care Seeing your primary care provider every 3 to 6 months helps us  monitor your health and provide consistent, personalized care.   Referrals If a referral was made during today's visit and you haven't received any updates within two weeks, please contact the referred provider directly to check on the status.  Recommended Screenings:  Health Maintenance  Topic Date Due   COVID-19 Vaccine (1) Never done   DTaP/Tdap/Td vaccine (2 - Td or Tdap) 12/02/2021   Flu Shot  11/02/2023   Medicare Annual Wellness Visit  04/30/2024   Colon Cancer Screening  10/25/2024*   Pneumococcal Vaccine for age over 34  Completed   Hepatitis C Screening  Completed   Zoster (Shingles) Vaccine  Completed   Meningitis B Vaccine  Aged Out   Cologuard (Stool DNA test)  Discontinued  *Topic was postponed. The date shown is not the original due date.       05/01/2024    8:44 AM  Advanced Directives  Does Patient Have a Medical Advance Directive? No  Would patient like information on creating a medical advance directive? No - Patient declined    Vision: Annual vision screenings are recommended for early detection of glaucoma, cataracts, and diabetic retinopathy. These exams can also reveal signs of chronic conditions such as diabetes and high blood pressure.  Dental: Annual dental screenings help detect early signs of oral cancer, gum disease, and other conditions linked to overall health, including heart disease and diabetes.  Please see the  attached documents for additional preventive care recommendations.

## 2024-05-01 NOTE — Progress Notes (Signed)
 "   Chief Complaint  Patient presents with   Medicare Wellness     Subjective:   Arthur Foster is a 74 y.o. male who presents for a Medicare Annual Wellness Visit.  Visit info / Clinical Intake: Medicare Wellness Visit Type:: Subsequent Annual Wellness Visit Persons participating in visit and providing information:: patient Medicare Wellness Visit Mode:: Telephone If telephone:: video declined Since this visit was completed virtually, some vitals may be partially provided or unavailable. Missing vitals are due to the limitations of the virtual format.: Unable to obtain vitals - no equipment If Telephone or Video please confirm:: I connected with patient using audio/video enable telemedicine. I verified patient identity with two identifiers, discussed telehealth limitations, and patient agreed to proceed. Patient Location:: home Provider Location:: home office Interpreter Needed?: No Pre-visit prep was completed: yes AWV questionnaire completed by patient prior to visit?: no Living arrangements:: lives with spouse/significant other Patient's Overall Health Status Rating: very good Typical amount of pain: none Does pain affect daily life?: no Are you currently prescribed opioids?: no  Dietary Habits and Nutritional Risks How many meals a day?: 2 Eats fruit and vegetables daily?: yes Most meals are obtained by: preparing own meals In the last 2 weeks, have you had any of the following?: none Diabetic:: no  Functional Status Activities of Daily Living (to include ambulation/medication): Independent Ambulation: Independent Medication Administration: Independent Home Management (perform basic housework or laundry): Independent Manage your own finances?: yes Primary transportation is: driving Concerns about vision?: no *vision screening is required for WTM* (2025) Concerns about hearing?: (!) yes Uses hearing aids?: no  Fall Screening Falls in the past year?: 0 Number of falls  in past year: 0 Was there an injury with Fall?: 0 Fall Risk Category Calculator: 0 Patient Fall Risk Level: Low Fall Risk  Fall Risk Patient at Risk for Falls Due to: No Fall Risks Fall risk Follow up: Falls evaluation completed; Education provided  Home and Transportation Safety: All rugs have non-skid backing?: yes All stairs or steps have railings?: yes Grab bars in the bathtub or shower?: yes Have non-skid surface in bathtub or shower?: yes Good home lighting?: yes Regular seat belt use?: yes Hospital stays in the last year:: no  Cognitive Assessment Difficulty concentrating, remembering, or making decisions? : no Will 6CIT or Mini Cog be Completed: yes What year is it?: 0 points What month is it?: 0 points Give patient an address phrase to remember (5 components): [address] [place] About what time is it?: 0 points Count backwards from 20 to 1: 0 points Say the months of the year in reverse: 0 points Repeat the address phrase from earlier: 0 points 6 CIT Score: 0 points  Advance Directives (For Healthcare) Does Patient Have a Medical Advance Directive?: No Would patient like information on creating a medical advance directive?: No - Patient declined  Reviewed/Updated  Reviewed/Updated: Reviewed All (Medical, Surgical, Family, Medications, Allergies, Care Teams, Patient Goals)    Allergies (verified) Bee venom, Other, Pollen extract, Short ragweed pollen ext, Zolpidem, and Zolpidem tartrate   Current Medications (verified) Outpatient Encounter Medications as of 05/01/2024  Medication Sig   ALPRAZolam  (XANAX ) 0.25 MG tablet Take 1 to 2 tablets as needed   cetirizine  (ZYRTEC ) 10 MG tablet Take 1 tablet (10 mg total) by mouth daily.   escitalopram  (LEXAPRO ) 20 MG tablet Take 1 tablet (20 mg total) by mouth daily.   famotidine  (PEPCID ) 20 MG tablet Take 1 tablet (20 mg total) by  mouth 2 (two) times daily for 5 days.   lisinopril  (ZESTRIL ) 20 MG tablet Take  1 tablet (20 mg total) by mouth daily.   Multiple Vitamins-Minerals (MULTIVITAMIN WITH MINERALS) tablet Take 1 tablet by mouth daily.   augmented betamethasone  dipropionate (DIPROLENE -AF) 0.05 % cream Apply topically 2 (two) times daily. At affected areas (avoid face and genitals) (Patient not taking: Reported on 05/01/2024)   No facility-administered encounter medications on file as of 05/01/2024.    History: Past Medical History:  Diagnosis Date   Anxiety    BPH (benign prostatic hyperplasia)    Depression    Hyperlipidemia    Hypertension    Testosterone  deficiency    Past Surgical History:  Procedure Laterality Date   FOOT SURGERY     HERNIA REPAIR Left inguinal   Family History  Problem Relation Age of Onset   Hypertension Mother    Alzheimer's disease Mother    Hypertension Father    Alzheimer's disease Father    Social History   Occupational History   Occupation: retired    Comment: works a few hours a week  Tobacco Use   Smoking status: Former    Current packs/day: 0.00    Types: Cigarettes    Quit date: 1998    Years since quitting: 28.0   Smokeless tobacco: Former    Types: Chew    Quit date: 2002  Vaping Use   Vaping status: Never Used  Substance and Sexual Activity   Alcohol use: Yes    Alcohol/week: 14.0 standard drinks of alcohol    Types: 14 Cans of beer per week   Drug use: No   Sexual activity: Yes    Birth control/protection: None   Tobacco Counseling Counseling given: Yes  SDOH Screenings   Food Insecurity: No Food Insecurity (05/01/2024)  Housing: Low Risk (05/01/2024)  Transportation Needs: No Transportation Needs (05/01/2024)  Utilities: Not At Risk (05/01/2024)  Alcohol Screen: Low Risk (10/26/2023)  Depression (PHQ2-9): Low Risk (05/01/2024)  Financial Resource Strain: Low Risk (10/26/2023)  Physical Activity: Inactive (05/01/2024)  Social Connections: Moderately Isolated (05/01/2024)  Stress: No Stress Concern Present (05/01/2024)   Tobacco Use: Medium Risk (05/01/2024)  Health Literacy: Adequate Health Literacy (05/01/2024)   See flowsheets for full screening details  Depression Screen PHQ 2 & 9 Depression Scale- Over the past 2 weeks, how often have you been bothered by any of the following problems? Little interest or pleasure in doing things: 0 Feeling down, depressed, or hopeless (PHQ Adolescent also includes...irritable): 0 PHQ-2 Total Score: 0 Trouble falling or staying asleep, or sleeping too much: 0 Feeling tired or having little energy: 0 Poor appetite or overeating (PHQ Adolescent also includes...weight loss): 0 Feeling bad about yourself - or that you are a failure or have let yourself or your family down: 0 Trouble concentrating on things, such as reading the newspaper or watching television (PHQ Adolescent also includes...like school work): 0 Moving or speaking so slowly that other people could have noticed. Or the opposite - being so fidgety or restless that you have been moving around a lot more than usual: 0 Thoughts that you would be better off dead, or of hurting yourself in some way: 0 PHQ-9 Total Score: 0 If you checked off any problems, how difficult have these problems made it for you to do your work, take care of things at home, or get along with other people?: Not difficult at all     Goals Addressed  This Visit's Progress    Prevent Falls and Injury               Objective:    Today's Vitals   05/01/24 1011  BP: 131/85  Pulse: 74  Weight: (!) 308 lb (139.7 kg)  Height: 6' 1 (1.854 m)   Body mass index is 40.64 kg/m.  Hearing/Vision screen No results found. Immunizations and Health Maintenance Health Maintenance  Topic Date Due   COVID-19 Vaccine (1) Never done   DTaP/Tdap/Td (2 - Td or Tdap) 12/02/2021   Influenza Vaccine  11/02/2023   Colonoscopy  10/25/2024 (Originally 07/19/2022)   Medicare Annual Wellness (AWV)  05/01/2025   Pneumococcal Vaccine:  50+ Years  Completed   Hepatitis C Screening  Completed   Zoster Vaccines- Shingrix  Completed   Meningococcal B Vaccine  Aged Out   Fecal DNA (Cologuard)  Discontinued        Assessment/Plan:  This is a routine wellness examination for Arthur Foster.  Patient Care Team: Severa Rock HERO, FNP as PCP - General (Family Medicine)  I have personally reviewed and noted the following in the patients chart:   Medical and social history Use of alcohol, tobacco or illicit drugs  Current medications and supplements including opioid prescriptions. Functional ability and status Nutritional status Physical activity Advanced directives List of other physicians Hospitalizations, surgeries, and ER visits in previous 12 months Vitals Screenings to include cognitive, depression, and falls Referrals and appointments  No orders of the defined types were placed in this encounter.  In addition, I have reviewed and discussed with patient certain preventive protocols, quality metrics, and best practice recommendations. A written personalized care plan for preventive services as well as general preventive health recommendations were provided to patient.   Arthur Foster, CMA   05/01/2024   Return in 1 year (on 05/01/2025).  After Visit Summary: (MyChart) Due to this being a telephonic visit, the after visit summary with patients personalized plan was offered to patient via MyChart   Nurse Notes: Per pt aware and due the following: Pt did not get the Covid, Flu, Dtap vaccine. Prob get the Dtap at next OV w/pcp "

## 2024-05-06 ENCOUNTER — Encounter: Payer: Self-pay | Admitting: Family Medicine

## 2024-05-08 ENCOUNTER — Other Ambulatory Visit: Payer: Self-pay | Admitting: Family Medicine

## 2024-05-08 DIAGNOSIS — F339 Major depressive disorder, recurrent, unspecified: Secondary | ICD-10-CM

## 2024-05-08 DIAGNOSIS — Z79899 Other long term (current) drug therapy: Secondary | ICD-10-CM

## 2024-05-08 DIAGNOSIS — F411 Generalized anxiety disorder: Secondary | ICD-10-CM

## 2024-05-08 NOTE — Telephone Encounter (Signed)
 For PCP to advise on.    Copied from CRM #[id number]. Topic: Clinical - Prescription Issue >> May 08, 2024  4:37 PM Arthur Foster wrote: Reason for CRM: Patient called. States his appt was cancelled due to weather. Has been reschedule not until [place]. If out of medication. No refills. Pharmacy sent request today - denied due to must fill at appt. Patient states he is out - refill was due 1/27. Would like a call back to know what can be done. Thank You Thank You

## 2024-05-09 MED ORDER — ALPRAZOLAM 0.25 MG PO TABS: ORAL_TABLET | ORAL | 0 refills | Status: AC

## 2024-05-09 NOTE — Telephone Encounter (Signed)
 Patient aware and verbalizes understanding.

## 2024-05-09 NOTE — Addendum Note (Signed)
 Addended by: SEVERA ROSALINE HERO on: 05/09/2024 07:45 AM   Modules accepted: Orders

## 2024-06-05 ENCOUNTER — Ambulatory Visit
